# Patient Record
Sex: Female | Born: 1955 | Race: Black or African American | Hispanic: No | Marital: Single | Smoking: Never smoker
Health system: Southern US, Community
[De-identification: ages and names within clinical notes are randomized; demographics above are authoritative.]

## PROBLEM LIST (undated history)

## (undated) DIAGNOSIS — I639 Cerebral infarction, unspecified: Secondary | ICD-10-CM

## (undated) DIAGNOSIS — L299 Pruritus, unspecified: Secondary | ICD-10-CM

## (undated) DIAGNOSIS — Z8719 Personal history of other diseases of the digestive system: Secondary | ICD-10-CM

## (undated) DIAGNOSIS — M67431 Ganglion, right wrist: Secondary | ICD-10-CM

## (undated) DIAGNOSIS — I10 Essential (primary) hypertension: Secondary | ICD-10-CM

## (undated) DIAGNOSIS — E785 Hyperlipidemia, unspecified: Secondary | ICD-10-CM

## (undated) DIAGNOSIS — J3089 Other allergic rhinitis: Secondary | ICD-10-CM

## (undated) DIAGNOSIS — I2510 Atherosclerotic heart disease of native coronary artery without angina pectoris: Secondary | ICD-10-CM

## (undated) DIAGNOSIS — F32A Depression, unspecified: Secondary | ICD-10-CM

## (undated) DIAGNOSIS — M549 Dorsalgia, unspecified: Secondary | ICD-10-CM

## (undated) DIAGNOSIS — B029 Zoster without complications: Secondary | ICD-10-CM

## (undated) DIAGNOSIS — IMO0001 Reserved for inherently not codable concepts without codable children: Secondary | ICD-10-CM

## (undated) DIAGNOSIS — E118 Type 2 diabetes mellitus with unspecified complications: Secondary | ICD-10-CM

## (undated) DIAGNOSIS — Z531 Procedure and treatment not carried out because of patient's decision for reasons of belief and group pressure: Secondary | ICD-10-CM

## (undated) DIAGNOSIS — F329 Major depressive disorder, single episode, unspecified: Secondary | ICD-10-CM

## (undated) DIAGNOSIS — T7840XA Allergy, unspecified, initial encounter: Secondary | ICD-10-CM

## (undated) HISTORY — DX: Depression, unspecified: F32.A

## (undated) HISTORY — PX: WISDOM TOOTH EXTRACTION: SHX21

## (undated) HISTORY — DX: Cerebral infarction, unspecified: I63.9

## (undated) HISTORY — DX: Dorsalgia, unspecified: M54.9

## (undated) HISTORY — PX: COLONOSCOPY: SHX174

## (undated) HISTORY — DX: Hyperlipidemia, unspecified: E78.5

## (undated) HISTORY — DX: Pruritus, unspecified: L29.9

## (undated) HISTORY — DX: Other allergic rhinitis: J30.89

## (undated) HISTORY — DX: Essential (primary) hypertension: I10

## (undated) HISTORY — DX: Ganglion, right wrist: M67.431

## (undated) HISTORY — DX: Type 2 diabetes mellitus with unspecified complications: E11.8

## (undated) HISTORY — DX: Allergy, unspecified, initial encounter: T78.40XA

## (undated) HISTORY — PX: POLYPECTOMY: SHX149

## (undated) HISTORY — DX: Personal history of other diseases of the digestive system: Z87.19

## (undated) HISTORY — DX: Major depressive disorder, single episode, unspecified: F32.9

---

## 1992-11-06 HISTORY — PX: ABDOMINAL HYSTERECTOMY: SHX81

## 1995-11-07 HISTORY — PX: CHOLECYSTECTOMY: SHX55

## 1998-10-21 ENCOUNTER — Encounter: Payer: Self-pay | Admitting: General Surgery

## 1998-10-25 ENCOUNTER — Ambulatory Visit (HOSPITAL_COMMUNITY): Admission: RE | Admit: 1998-10-25 | Discharge: 1998-10-26 | Payer: Self-pay | Admitting: General Surgery

## 1998-11-06 DIAGNOSIS — I639 Cerebral infarction, unspecified: Secondary | ICD-10-CM

## 1998-11-06 HISTORY — DX: Cerebral infarction, unspecified: I63.9

## 1999-06-19 ENCOUNTER — Emergency Department (HOSPITAL_COMMUNITY): Admission: EM | Admit: 1999-06-19 | Discharge: 1999-06-19 | Payer: Self-pay | Admitting: Emergency Medicine

## 1999-06-19 ENCOUNTER — Encounter: Payer: Self-pay | Admitting: Emergency Medicine

## 2002-06-04 ENCOUNTER — Encounter: Admission: RE | Admit: 2002-06-04 | Discharge: 2002-06-04 | Payer: Self-pay | Admitting: Urology

## 2002-06-04 ENCOUNTER — Encounter: Payer: Self-pay | Admitting: Urology

## 2004-06-20 ENCOUNTER — Ambulatory Visit (HOSPITAL_COMMUNITY): Admission: RE | Admit: 2004-06-20 | Discharge: 2004-06-20 | Payer: Self-pay | Admitting: *Deleted

## 2004-06-20 ENCOUNTER — Encounter (INDEPENDENT_AMBULATORY_CARE_PROVIDER_SITE_OTHER): Payer: Self-pay | Admitting: Specialist

## 2006-12-26 ENCOUNTER — Encounter: Admission: RE | Admit: 2006-12-26 | Discharge: 2006-12-26 | Payer: Self-pay | Admitting: Family Medicine

## 2009-12-08 ENCOUNTER — Encounter: Admission: RE | Admit: 2009-12-08 | Discharge: 2009-12-08 | Payer: Self-pay | Admitting: Family Medicine

## 2010-11-27 ENCOUNTER — Encounter: Payer: Self-pay | Admitting: Obstetrics & Gynecology

## 2011-03-24 NOTE — Op Note (Signed)
NAME:  Haley [name], Haley [name]                        ACCOUNT NO.:  [id number]   MEDICAL RECORD NO.:  [id number]                   PATIENT TYPE:  AMB   LOCATION:  ENDO                                 FACILITY:  [facility]   PHYSICIAN:  Georgiana Spinner, M.D.                 DATE OF BIRTH:  10-10-1956   DATE OF PROCEDURE:  06/20/2004  DATE OF DISCHARGE:                                 OPERATIVE REPORT   PROCEDURE:  Upper endoscopy.   INDICATIONS:  GERD, Hemoccult positivity.   ANESTHESIA:  Demerol 70 mg, Versed 7 mg.   PROCEDURE:  With the patient mildly sedated in the left lateral decubitus  position, the Olympus videoscopic endoscope was inserted into the mouth,  passed under direct vision through the esophagus, which appeared normal.  There was no evidence of Barrett's esophagus.  We entered into the stomach.  Fundus, body, antrum, duodenal bulb, second portion of duodenum all appeared  normal.  From this point the endoscope was slowly withdrawn, taking  circumferential views of the duodenal mucosa until the endoscope was pulled  back into the stomach, placed in retroflexion to view the stomach from  below.  The endoscope was straightened and withdrawn taking circumferential  views of the remaining gastric and esophageal mucosa.  The patient's vital  signs and pulse oximetry remained stable.  The patient tolerated the  procedure well without apparent complication.   FINDINGS:  Unremarkable examination.  Proceed to colonoscopy.                                               Georgiana Spinner, M.D.    GMO/MEDQ  D:  06/20/2004  T:  06/20/2004  Job:  [id number]

## 2011-03-24 NOTE — Op Note (Signed)
NAME:  Haley [name], Haley [name]                        ACCOUNT NO.:  [id number]   MEDICAL RECORD NO.:  [id number]                   PATIENT TYPE:  AMB   LOCATION:  ENDO                                 FACILITY:  [facility]   PHYSICIAN:  Georgiana Spinner, M.D.                 DATE OF BIRTH:  Jun 01, 1956   DATE OF PROCEDURE:  06/20/2004  DATE OF DISCHARGE:                                 OPERATIVE REPORT   PROCEDURE:  Colonoscopy.   INDICATIONS:  Colon polyp.   ANESTHESIA:  None further given.   PROCEDURE:  With the patient mildly sedated in the left lateral decubitus  position, the Olympus videoscopic colonoscope was inserted in the rectum and  passed under direct vision to the cecum, identified by the ileocecal valve  and appendiceal orifice, both of which were photographed.  From this point  the colonoscope was slowly withdrawn taking circumferential views of the  colonic mucosa, stopping to photograph diverticula seen along the way in the  sigmoid colon until we reached the rectum, which appeared normal on direct  and showed hemorrhoids and a small polyp on retroflexed view.  The polyp was  removed using hot biopsy forceps technique, setting of 20/20 blended  current.  The endoscope was then straightened and withdrawn.  The patient's  vital signs and pulse oximetry remained stable.  The patient tolerated the  procedure well without apparent complications.   FINDINGS:  1. Internal hemorrhoids.  2. Diverticulosis of the sigmoid colon.  3. Small polyp of rectum.   PLAN:  Await biopsy report.  The patient will call me for results and follow  up with me as an outpatient.                                               Georgiana Spinner, M.D.    GMO/MEDQ  D:  06/20/2004  T:  06/20/2004  Job:  [id number]

## 2012-03-11 ENCOUNTER — Other Ambulatory Visit: Payer: Self-pay | Admitting: Family Medicine

## 2012-03-11 DIAGNOSIS — R1031 Right lower quadrant pain: Secondary | ICD-10-CM

## 2012-03-13 ENCOUNTER — Ambulatory Visit
Admission: RE | Admit: 2012-03-13 | Discharge: 2012-03-13 | Disposition: A | Discharge status: Home / Self Care | Payer: BC Managed Care – PPO | Source: Ambulatory Visit | Attending: Family Medicine | Admitting: Family Medicine

## 2012-03-13 DIAGNOSIS — R1031 Right lower quadrant pain: Secondary | ICD-10-CM

## 2013-07-22 ENCOUNTER — Encounter: Payer: Self-pay | Admitting: Family Medicine

## 2013-07-22 ENCOUNTER — Ambulatory Visit (INDEPENDENT_AMBULATORY_CARE_PROVIDER_SITE_OTHER): Payer: Managed Care, Other (non HMO) | Admitting: Family Medicine

## 2013-07-22 VITALS — BP 128/88 | HR 74 | Temp 98.8°F | Resp 16 | Ht 62.0 in | Wt 224.0 lb

## 2013-07-22 DIAGNOSIS — I10 Essential (primary) hypertension: Secondary | ICD-10-CM | POA: Insufficient documentation

## 2013-07-22 DIAGNOSIS — E118 Type 2 diabetes mellitus with unspecified complications: Secondary | ICD-10-CM | POA: Insufficient documentation

## 2013-07-22 DIAGNOSIS — Z13220 Encounter for screening for lipoid disorders: Secondary | ICD-10-CM

## 2013-07-22 DIAGNOSIS — Z23 Encounter for immunization: Secondary | ICD-10-CM

## 2013-07-22 DIAGNOSIS — J309 Allergic rhinitis, unspecified: Secondary | ICD-10-CM

## 2013-07-22 DIAGNOSIS — E669 Obesity, unspecified: Secondary | ICD-10-CM | POA: Insufficient documentation

## 2013-07-22 DIAGNOSIS — R202 Paresthesia of skin: Secondary | ICD-10-CM

## 2013-07-22 DIAGNOSIS — E119 Type 2 diabetes mellitus without complications: Secondary | ICD-10-CM

## 2013-07-22 HISTORY — DX: Type 2 diabetes mellitus with unspecified complications: E11.8

## 2013-07-22 LAB — COMPREHENSIVE METABOLIC PANEL
ALT: 24 U/L (ref 0–35)
AST: 23 U/L (ref 0–37)
BUN: 12 mg/dL (ref 6–23)
Calcium: 9.9 mg/dL (ref 8.4–10.5)
Chloride: 106 mEq/L (ref 96–112)
Potassium: 3.7 mEq/L (ref 3.5–5.3)
Sodium: 138 mEq/L (ref 135–145)
Total Protein: 7.1 g/dL (ref 6.0–8.3)

## 2013-07-22 LAB — LDL CHOLESTEROL, DIRECT: Direct LDL: 134 mg/dL — ABNORMAL HIGH

## 2013-07-22 MED ORDER — GLIMEPIRIDE 4 MG PO TABS: 4.0000 mg | ORAL_TABLET | Freq: Every day | ORAL | Status: DC

## 2013-07-22 MED ORDER — LISINOPRIL 40 MG PO TABS: 40.0000 mg | ORAL_TABLET | Freq: Every day | ORAL | Status: DC

## 2013-07-22 NOTE — Patient Instructions (Addendum)
Your Diabetes is well controlled. I suspect you have allergy symptoms as the cause of sore throat and sinus headache; resume over-the-counter Cetrizine daily.  Return to the clinic in mid- October for Flu vaccine. You do not need an appotinment.

## 2013-07-22 NOTE — Progress Notes (Signed)
S:  This 57 y.o. AA female is new to [facility]. Pt has Type II DM (diagnosed 8 years ago) and well controlled HTN. FSBS checked daily= 46-120. She c/o muscle cramps and "strange feelings" in her legs; she thinks she may have RLS.   Vision evaluation annually; no diseases and wears corrective lenses.  PMHx, Soc Hx and Fam Hx reviewed.  Medications reconciled.  ROS: Negative for diaphoresis, fatigue, anorexia, abnormal weight change; vision changes, CP or tightness, palpitations, SOB or DOE, cough, myalgias, HA, dizziness, numbness, weakness or syncope. She does not report behavior changes, confusion, decreased concentration or dysphoric mood.  O: Filed Vitals:   07/22/13 1503  BP: 128/88  Pulse: 74  Temp: 98.8 F (37.1 C)  Resp: 16   GEN: In NAD: WN,WD.  HENT: [place]/AT; EOMI w/ clear conj/sclerae. Wearing corrective lenses. EACs/TMs normal. Post ph erythematous w/o exudate or lesions. Nasal mucosa erythematous. Sinuses tender w/ percussion. NECK: Supple; no LAN or TMG. COR: RRR. No edema.Distal pulses 1-2+/=. LUNGS: Normal resp rate and effort. SKIN: W&D; intact. See DM foot exam- no lesions, ulcers, calluses or corns; slight nail discoloration noted. MS: MAEs; mild calf tenderness w/ moderate palpation. NEURO: A&O x 3; CNs intact. (See DM foot exam- n/v intact). Nonfocal.    Results for orders placed in visit on 07/22/13  GLUCOSE, POCT (MANUAL RESULT ENTRY)      Result Value Range   POC Glucose 72  70 - 99 mg/dl  POCT GLYCOSYLATED HEMOGLOBIN (HGB A1C)      Result Value Range   Hemoglobin A1C 5.8      A/P: Type II or unspecified type diabetes mellitus without mention of complication, not stated as uncontrolled -  Continue Glimepiride 4 mg 1 tablet daily. Focus on nutrition modification and regular physical activity.  Plan: POCT glucose (manual entry), T3, Free, TSH, Comprehensive metabolic panel, Vitamin D, 25-hydroxy, POCT glycosylated hemoglobin (Hb A1C)  HTN, goal below 140/90-  Stable on current medication; no change at this time. Refill Lisinopril 40 mg 1 tab daily.  Allergic rhinitis, cause unspecified- Resume OTC Certirizine 10 mg daily.  Paresthesias- R/O Vitamin D deficiency.  Need for lipid screening - Plan: LDL Cholesterol, Direct  Need for prophylactic vaccination against Streptococcus pneumoniae (pneumococcus) - Plan: Pneumococcal polysaccharide vaccine 23-valent greater than or equal to 2yo subcutaneous/IM. RTC in Mid-October for Influenza vaccine.

## 2013-07-23 LAB — TSH: TSH: 2.274 u[IU]/mL (ref 0.350–4.500)

## 2013-07-27 ENCOUNTER — Encounter: Payer: Self-pay | Admitting: Family Medicine

## 2013-09-11 ENCOUNTER — Other Ambulatory Visit: Payer: Self-pay

## 2013-10-23 ENCOUNTER — Ambulatory Visit: Payer: Managed Care, Other (non HMO) | Admitting: Family Medicine

## 2014-02-12 ENCOUNTER — Ambulatory Visit: Payer: Managed Care, Other (non HMO) | Admitting: Family Medicine

## 2014-02-25 ENCOUNTER — Ambulatory Visit (INDEPENDENT_AMBULATORY_CARE_PROVIDER_SITE_OTHER): Payer: Managed Care, Other (non HMO) | Admitting: Family Medicine

## 2014-02-25 ENCOUNTER — Encounter: Payer: Self-pay | Admitting: Family Medicine

## 2014-02-25 VITALS — BP 164/102 | HR 71 | Temp 98.6°F | Resp 16 | Ht 62.0 in | Wt 222.0 lb

## 2014-02-25 DIAGNOSIS — B351 Tinea unguium: Secondary | ICD-10-CM

## 2014-02-25 DIAGNOSIS — E119 Type 2 diabetes mellitus without complications: Secondary | ICD-10-CM

## 2014-02-25 DIAGNOSIS — R002 Palpitations: Secondary | ICD-10-CM

## 2014-02-25 DIAGNOSIS — I10 Essential (primary) hypertension: Secondary | ICD-10-CM

## 2014-02-25 LAB — POCT GLYCOSYLATED HEMOGLOBIN (HGB A1C): HEMOGLOBIN A1C: 5.6

## 2014-02-25 MED ORDER — EFINACONAZOLE 10 % EX SOLN: 1.0000 "application " | Freq: Every day | CUTANEOUS | Status: DC

## 2014-02-25 MED ORDER — GLIMEPIRIDE 4 MG PO TABS: 4.0000 mg | ORAL_TABLET | Freq: Every day | ORAL | Status: DC

## 2014-02-25 MED ORDER — LISINOPRIL 40 MG PO TABS: 40.0000 mg | ORAL_TABLET | Freq: Every day | ORAL | Status: DC

## 2014-02-25 MED ORDER — METOPROLOL SUCCINATE ER 25 MG PO TB24: 12.5000 mg | ORAL_TABLET | Freq: Every day | ORAL | Status: DC

## 2014-02-25 NOTE — Progress Notes (Signed)
S:  This 58 y.o. AA female is here for Type II DM and HTN follow-up. She is compliant w/ medications and meal planning, managing to lose 2 lbs. FSBS checked several times a week; no report of hypoglycemia.   BP uncontrolled; home BP readings seem to be "creeping up and up".  Pt has hx of intermittent palpitations but never evaluated by cardiologist. She denies diaphoresis, fatigue, vision disturbances, CP or tightness, edema, SOB or DOE, cough, abd or back pain, HA, dizziness, numbness, weakness or syncope.  Nail fungus- R great toenail is thick, discolored and occasionally painful.  Patient Active Problem List   Diagnosis Date Noted  . HTN, goal below 140/90 07/22/2013  . Obesity, unspecified 07/22/2013  . Type II or unspecified type diabetes mellitus without mention of complication, not stated as uncontrolled 07/22/2013  . Allergic rhinitis, cause unspecified 07/22/2013   PMHx, Surg Hx, Soc and Fam Hx reviewed.  MEDICATIONS reconciled. Pt indicated she is not taking Tribenzor (this is antihypertensive which has 3 medications in 1 tablet).  O: Filed Vitals:   02/25/14 1058  BP: 164/102  Pulse: 71  Temp: 98.6 F (37 C)  Resp: 16   GEN: In NAD: WN,WD. HENT: [place]/AT; EOMI w/ clear conj/sclerae. Otherwise unremarkable. NECK: Supple w/o LAN or TMG. COR: RRR w/ intermittent PAC. Normal S1 and S2. No m/g/r. No edema. LUNGS: CTA; normal resp rate and effort. SKIN: W&D; intact. R foot- great toenail is discolored and thickened, slightly raised off nailbed. See DM FOOT Exam. NEURO: A&O x 3; CNs intact. Nonfocal.  Results for orders placed in visit on 02/25/14  POCT GLYCOSYLATED HEMOGLOBIN (HGB A1C)      Result Value Ref Range   Hemoglobin A1C 5.6      ECG: Sinus bradycardia. Inferior and anterior T wave changes are nonspecific. No ectopy.   A/P: Type II or unspecified type diabetes mellitus without mention of complication, not stated as uncontrolled - Well controlled on current  medication; continue same. Plan: HM Diabetes Foot Exam, POCT glycosylated hemoglobin (Hb A1C)  HTN, goal below 140/90 - Uncontrolled; continue Lisinopril 40 mg daily and add Metoprolol succinate 25 mg  1/2 tablet daily. Plan: EKG 12-Lead, Ambulatory referral to Cardiology  Intermittent palpitations - Plan: EKG 12-Lead, Ambulatory referral to Cardiology  Nail fungus- Trial Jublia 1 application daily for 48 weeks. If irritation occurs or condition worsens, will refer to Podiatry.  Meds ordered this encounter  Medications  . glimepiride (AMARYL) 4 MG tablet    Sig: Take 1 tablet (4 mg total) by mouth daily before breakfast.    Dispense:  30 tablet    Refill:  5  . lisinopril (PRINIVIL,ZESTRIL) 40 MG tablet    Sig: Take 1 tablet (40 mg total) by mouth daily.    Dispense:  30 tablet    Refill:  5  . metoprolol succinate (TOPROL-XL) 25 MG 24 hr tablet    Sig: Take 0.5 tablets (12.5 mg total) by mouth daily.    Dispense:  30 tablet    Refill:  3  . Efinaconazole 10 % SOLN    Sig: Apply 1 application topically daily.    Dispense:  4 mL    Refill:  3

## 2014-02-25 NOTE — Patient Instructions (Signed)
Women and Heart Disease Heart disease (HD) risk factors for both men and women are similar. Most studies addressing the diagnosis and treatment of heart disease have focused primarily on men. More research is now being done on women and heart disease.  GENDER DIFFERENCES  Symptoms of a heart attack for women may be more subtle, less typical and harder to identify.  Women are often slow to recognize heart disease risk factors and symptoms.  More women than men die from a heart attack before reaching a hospital.  Results of medical tests can vary by gender, especially electrocardiogram stress testing.  A common perception is that men are more likely to have heart problems. This is not true, especially in women after menopause.  Effects of estrogen, birth control and hormone therapy can have unique effects on the heart.  Women are more likely to be referred for a mental health evaluation of their symptoms. CAUSES Heart disease may be caused from conditions such as:  Buildup of fat-like deposits (plaques) in the blood vessels (coronary arteries) of the heart. The build up of fat-like deposits cause blockages that decrease the blood flow to the heart muscle.  Blockage or narrowing of the coronary arteries decreases oxygen to the heart muscle.  Abnormal heart rhythms or problems with the electrical system of the heart.  Heart muscle that has become enlarged or weak and cannot pump well.  Abnormal heart valves that either leak or are thickened and do not open and close properly.  Damage from infection or drugs.  Heart problems present at birth. RISK FACTORS  Family history.  Elevated blood lipid levels (cholesterol).  High blood pressure.  Diabetes.  Smoking.  Inactivity, lack of exercise.  Weighing 30% more than your ideal weight.  Age.  Past history of heart problems. SYMPTOMS  Chest discomfort or pressure, which may include the following:  Discomfort, fullness,  tightness, squeezing in center of chest that stays for a few minutes or comes and goes.  Discomfort or pressure that spreads to upper back, shoulders, neck, jaw or stomach.  Discomfort or tingling in the arms.  Profuse or clammy sweating.  Difficulty breathing.  Nausea.  Feeling your heart "flutter" or "jump."  Unexplained feelings of anxiety, fatigue or weakness.  Dizziness. DIAGNOSIS Diagnosis may include a test that:  Records the electrical activity of the heart and looks for changes (EKG [electrocardiogram]) .  Detects the presence of special proteins and enzymes that may show damage to the heart muscle (blood tests).  Looks at the blood flow through the heart and coronary arteries by using special dyes and X-rays (coronary angiography).  Uses sound waves to examine your heart valves, muscle function and blood flow within the heart (echo or echocardiogram).  Looks for symptoms as your heart works harder under stress (stress tests).  Creates images of the heart by detecting radiation following administration of a radioactive tracer (nuclear imaging).  Records the electrical activity of the heart and helps in detecting abnormalities of heart rhythm (electrophysiology).  Creates an image of the anatomy of the heart (CT heart scan). TREATMENT   Medications may be used to control your blood pressure, keep your heart beating regularly, reduce pain, and help your breathing.  If you are admitted to the hospital, the length of your stay depends on the amount of heart damage and any complications you may have.  Severe heart problems may require open heart surgery. This is a procedure where blocked coronary arteries are bypassed with a vein   from your leg.  If you have a single small coronary artery blockage and no heart damage, you may have a balloon angioplasty. This procedure uses stents that may help open the blockage and restore normal heart circulation. Stents are small,  wire, mesh-like tubes that help keep the artery open.  If needed, blood thinners may be used to dissolve clots. HOME CARE INSTRUCTIONS  Follow the treatment plan your caregiver prescribes.  Keep a list of every medicine you are taking. Keep it up to date and with you all the time.  Get help from your caregiver or pharmacist to learn the following about each medicine:  Why you are taking it.  What time of day to take it.  Possible side effects.  What foods to take with your medication and which foods to avoid.  When to stop taking your medication.  Try to maintain normal cholesterol levels.  Eat a heart healthy diet with salt and fat restrictions as advised. PREVENTION  You can reduce your risk of heart disease by doing the following:  Visit your caregiver regularly and determine whether you are at risk.  Quit smoking and keep away from those who smoke.  Get your blood pressure checked regularly and make sure it remains within normal limits.  Limit salt in your diet.  Maintain normal blood sugar and cholesterol levels.  Exercise regularly (walking or another form of aerobic activity, preferably 30 minutes continuously).  Maintain ideal body weight.  Reduce stress, anger, and depression.  If you have already had a heart attack, consult your caregiver about methods and medicines that may help in reducing your risk of having a second heart attack.  Be aware of the symptoms of heart disease and seek medical care if you develop these symptoms. SEEK IMMEDIATE MEDICAL CARE IF:  You have severe chest pain or the above symptoms, call your local emergency services (911 if in [place] THIS IS AN EMERGENCY. Do not wait to see if the pain will go away. DO NOT drive yourself to the hospital.  You notice increasing shortness of breath during rest, sleeping or with activity. Insist that providers take your complaints seriously and do a thorough heart evaluation. Document Released:  04/10/2008 Document Revised: 01/15/2012 Document Reviewed: 04/10/2008 Exit[employer] Patient Information 2014 [employer].  

## 2014-02-26 ENCOUNTER — Telehealth: Payer: Self-pay

## 2014-02-26 DIAGNOSIS — B351 Tinea unguium: Secondary | ICD-10-CM

## 2014-02-26 DIAGNOSIS — L609 Nail disorder, unspecified: Secondary | ICD-10-CM

## 2014-02-26 NOTE — Telephone Encounter (Signed)
LMOM for pt w/info about the referral and advised she will be getting a call about appt.

## 2014-02-26 NOTE — Telephone Encounter (Signed)
PA needed for Jublia. Called pt who reported she has tried OTC Lotrimax prev but nothing else. Dr Audria NineMcPherson, when I began the PA, the form said that the preferred generics are ciclopirox, fluconazole, and terbinafine. Would any of these be appropriate to try first? If not, please advise so that I will no what to put on the PA form. It will probably not be approved unless she has tried/failed at least some of the alternatives.

## 2014-02-26 NOTE — Telephone Encounter (Signed)
Do not bother with the PA. I would suggest pt see a podiatrist because nail is pretty discolored and abnormal. I did discuss this possibility with her at [facility].  I will order the referral. Would you advise pt about this? Thanks. Sorry for the extra work!  :>)

## 2014-03-19 ENCOUNTER — Encounter: Payer: Self-pay | Admitting: Cardiology

## 2014-03-19 ENCOUNTER — Ambulatory Visit (INDEPENDENT_AMBULATORY_CARE_PROVIDER_SITE_OTHER): Payer: Managed Care, Other (non HMO) | Admitting: Cardiology

## 2014-03-19 VITALS — BP 136/90 | HR 60 | Ht 62.0 in | Wt 222.0 lb

## 2014-03-19 DIAGNOSIS — R0600 Dyspnea, unspecified: Secondary | ICD-10-CM | POA: Insufficient documentation

## 2014-03-19 DIAGNOSIS — I10 Essential (primary) hypertension: Secondary | ICD-10-CM

## 2014-03-19 DIAGNOSIS — R5381 Other malaise: Secondary | ICD-10-CM

## 2014-03-19 DIAGNOSIS — R5383 Other fatigue: Secondary | ICD-10-CM | POA: Insufficient documentation

## 2014-03-19 DIAGNOSIS — R0609 Other forms of dyspnea: Secondary | ICD-10-CM

## 2014-03-19 DIAGNOSIS — R0989 Other specified symptoms and signs involving the circulatory and respiratory systems: Secondary | ICD-10-CM

## 2014-03-19 DIAGNOSIS — R002 Palpitations: Secondary | ICD-10-CM | POA: Insufficient documentation

## 2014-03-19 DIAGNOSIS — R42 Dizziness and giddiness: Secondary | ICD-10-CM

## 2014-03-19 MED ORDER — HYDROCHLOROTHIAZIDE 25 MG PO TABS: 25.0000 mg | ORAL_TABLET | Freq: Every day | ORAL | Status: DC

## 2014-03-19 NOTE — Progress Notes (Signed)
Patient ID: Haley LyonsCynthia L Friar, female   DOB: 03-Jan-1956, 58 y.o.   MRN: [id number]     Patient Name: Haley LyonsCynthia L Miske Date of Encounter: 03/19/2014  Primary Care Provider:  No primary provider on file. Primary Cardiologist: Lars MassonKatarina H Streat  Problem List   Past Medical History  Diagnosis Date  . Diabetes mellitus without complication   . Hypertension    Past Surgical History  Procedure Laterality Date  . Cholecystectomy    . Abdominal hysterectomy    . Wisdom tooth extraction     Allergies  No Known Allergies  HPI  58 year old female with prior medical history of obesity, hypertension, well-controlled known insulin-dependent diabetes mellitus with latest hemoglobin A1c of 5.8 %. The patient is coming with concern of palpitations that happen almost daily and are sometimes associated with dizziness but she never experienced syncopal episodes. Her palpitations usually last seconds but on occasion up to minutes and gets when she becomes symptomatic. The patient is also experiencing worsening dyspnea on exertion, activity of daily is leaving like walking stairs would make her feel short of breath. She feels this is progressively worsening. The patient also states that she has been on blood pressure medication for years but lately her primary care physician had problems controlling diet as her blood pressure keeps increasing despite her being compliant to her meds. She states that today's measurement has been well as it has been in the file and it's usually always elevated. She is also experiencing overall fatigued and mild lower extremity edema.  Home Medications  Prior to Admission medications   Medication Sig Start Date End Date Taking? Authorizing Provider  aspirin 81 MG tablet Take 81 mg by mouth daily.   Yes Historical Provider, MD  Cinnamon 500 MG TABS Take by mouth daily.   Yes Historical Provider, MD  Flax Oil-Fish Oil-Borage Oil (FISH OIL-FLAX OIL-BORAGE OIL PO) Take by mouth  daily.   Yes Historical Provider, MD  Flaxseed, Linseed, (FLAX SEED OIL PO) Take by mouth daily.   Yes Historical Provider, MD  glimepiride (AMARYL) 4 MG tablet Take 1 tablet (4 mg total) by mouth daily before breakfast. 02/25/14  Yes Maurice MarchBarbara B McPherson, MD  lisinopril (PRINIVIL,ZESTRIL) 40 MG tablet Take 1 tablet (40 mg total) by mouth daily. 02/25/14  Yes Maurice MarchBarbara B McPherson, MD  loratadine (ALLERGY RELIEF) 10 MG tablet Take 10 mg by mouth daily.   Yes Historical Provider, MD  Magnesium 250 MG TABS Take by mouth daily.   Yes Historical Provider, MD  metoprolol succinate (TOPROL-XL) 25 MG 24 hr tablet Take 0.5 tablets (12.5 mg total) by mouth daily. 02/25/14  Yes Maurice MarchBarbara B McPherson, MD  Multiple Vitamins-Minerals (MULTIVITAMIN WITH MINERALS) tablet Take 1 tablet by mouth daily.   Yes Historical Provider, MD  Turmeric Curcumin 500 MG CAPS Take 1 tablet by mouth daily.   Yes Historical Provider, MD    Family History  Family History  Problem Relation Age of Onset  . Hypertension Father   . Stroke Maternal Aunt     SEVERAL  . Hypertension Brother   . Diabetes Brother     Social History  History   Social History  . Marital Status: Single    Spouse Name: N/A    Number of Children: N/A  . Years of Education: N/A   Occupational History  . Leasing Agent    Social History Main Topics  . Smoking status: Never Smoker   . Smokeless tobacco: Not on file  . Alcohol  Use: Yes     Comment: occassionally  . Drug Use: No  . Sexual Activity: No   Other Topics Concern  . Not on file   Social History Narrative   Divorced. Education: [employer]. Exercise: Yes.     Review of Systems, as per HPI, otherwise negative General:  No chills, fever, night sweats or weight changes.  Cardiovascular:  No chest pain, dyspnea on exertion, edema, orthopnea, palpitations, paroxysmal nocturnal dyspnea. Dermatological: No rash, lesions/masses Respiratory: No cough, dyspnea Urologic: No hematuria,  dysuria Abdominal:   No nausea, vomiting, diarrhea, bright red blood per rectum, melena, or hematemesis Neurologic:  No visual changes, wkns, changes in mental status. All other systems reviewed and are otherwise negative except as noted above.  Physical Exam  Blood pressure 136/90, pulse 60, height 5\' 2"  (1.575 m), weight 222 lb (100.699 kg).  General: Pleasant, NAD Psych: Normal affect. Neuro: Alert and oriented X 3. Moves all extremities spontaneously. HEENT: Normal  Neck: Supple without bruits or JVD. Lungs:  Resp regular and unlabored, CTA. Heart: RRR no s3, s4, or murmurs. Abdomen: Soft, non-tender, non-distended, BS + x 4.  Extremities: No clubbing, cyanosis, mild bilateral edema. DP/PT/Radials 2+ and equal bilaterally.  Labs:  No results found for this basename: CKTOTAL, CKMB, TROPONINI,  in the last 72 hours No results found for this basename: WBC, HGB, HCT, MCV, PLT    No results found for this basename: DDIMER   No components found with this basename: POCBNP,     Component Value Date/Time   NA 138 07/22/2013 1620   K 3.7 07/22/2013 1620   CL 106 07/22/2013 1620   CO2 24 07/22/2013 1620   GLUCOSE 71 07/22/2013 1620   BUN 12 07/22/2013 1620   CREATININE 0.94 07/22/2013 1620   CALCIUM 9.9 07/22/2013 1620   PROT 7.1 07/22/2013 1620   ALBUMIN 4.1 07/22/2013 1620   AST 23 07/22/2013 1620   ALT 24 07/22/2013 1620   ALKPHOS 54 07/22/2013 1620   BILITOT 0.4 07/22/2013 1620   No results found for this basename: CHOL, HDL, LDLCALC, TRIG    Accessory Clinical Findings  Echocardiogram - none  ECG - sinus bradycardia, nonspecific T wave abnormalities.    Assessment & Plan  Very pleasant 58 year old female  1. palpitations that are associated with dizziness - we will order a 48-hour Holter monitor to evaluate for arrhythmias. Her recent labs were normal including electrolytes, and TSH.  2. worsening exertional dyspnea and fatigue - we will order an exercise nuclear stress  test to evaluate for possible ischemia. We will discontinue Toprol-XL and start hydrochlorothiazide 25 mg daily.  3. Hypertension - we will order echocardiogram to evaluate for systolic diastolic function and degree of LVH. For now we'll discontinue metoprolol as she feels is causing her significant fatigue and we'll start hydrochlorothiazide 25 mg daily  4. Lipids - borderline LDL, for now we will continue just with diet changes.  Followup in 6 weeks.  Lars MassonKatarina H Buffkin, MD, [facility] 03/19/2014, 2:08 PM

## 2014-03-19 NOTE — Patient Instructions (Addendum)
Your physician recommends that you continue on your current medications as directed. Please refer to the Current Medication list given to you today.  Your physician has recommended that you wear a 48 hour holter monitor. Holter monitors are medical devices that record the heart's electrical activity. Doctors most often use these monitors to diagnose arrhythmias. Arrhythmias are problems with the speed or rhythm of the heartbeat. The monitor is a small, portable device. You can wear one while you do your normal daily activities. This is usually used to diagnose what is causing palpitations/syncope (passing out).  Your physician has requested that you have en exercise stress myoview. For further information please visit [email or web address]. Please follow instruction sheet, as given.  Your physician has requested that you have an echocardiogram. Echocardiography is a painless test that uses sound waves to create images of your heart. It provides your doctor with information about the size and shape of your heart and how well your heart's chambers and valves are working. This procedure takes approximately one hour. There are no restrictions for this procedure.  Your physician recommends that you schedule a follow-up appointment in: 6 WEEKS WITH DR Delton SeeNELSON  DISCONTINUE METOPROLOL (TOPROL XL)  START TAKING HYDROCHLOROTHIAZIDE 25 MG DAILY

## 2014-03-26 ENCOUNTER — Encounter (INDEPENDENT_AMBULATORY_CARE_PROVIDER_SITE_OTHER): Payer: Managed Care, Other (non HMO)

## 2014-03-26 ENCOUNTER — Encounter: Payer: Self-pay | Admitting: *Deleted

## 2014-03-26 DIAGNOSIS — R0609 Other forms of dyspnea: Secondary | ICD-10-CM

## 2014-03-26 DIAGNOSIS — I10 Essential (primary) hypertension: Secondary | ICD-10-CM

## 2014-03-26 DIAGNOSIS — R42 Dizziness and giddiness: Secondary | ICD-10-CM

## 2014-03-26 DIAGNOSIS — R002 Palpitations: Secondary | ICD-10-CM

## 2014-03-26 NOTE — Progress Notes (Signed)
Patient ID: Haley LyonsCynthia L [name], female   DOB: [birth year], 58 y.o.   MRN: [id number] E-Cardio 48 hour holter monitor applied to patient.

## 2014-04-07 ENCOUNTER — Telehealth: Payer: Self-pay | Admitting: *Deleted

## 2014-04-07 ENCOUNTER — Ambulatory Visit (HOSPITAL_COMMUNITY): Payer: Managed Care, Other (non HMO) | Attending: Cardiology | Admitting: Radiology

## 2014-04-07 ENCOUNTER — Ambulatory Visit (HOSPITAL_BASED_OUTPATIENT_CLINIC_OR_DEPARTMENT_OTHER): Payer: Managed Care, Other (non HMO) | Admitting: Radiology

## 2014-04-07 VITALS — BP 147/71 | HR 66 | Ht 62.0 in | Wt 220.0 lb

## 2014-04-07 DIAGNOSIS — E119 Type 2 diabetes mellitus without complications: Secondary | ICD-10-CM | POA: Insufficient documentation

## 2014-04-07 DIAGNOSIS — I10 Essential (primary) hypertension: Secondary | ICD-10-CM

## 2014-04-07 DIAGNOSIS — R0609 Other forms of dyspnea: Secondary | ICD-10-CM | POA: Insufficient documentation

## 2014-04-07 DIAGNOSIS — R002 Palpitations: Secondary | ICD-10-CM | POA: Insufficient documentation

## 2014-04-07 DIAGNOSIS — R42 Dizziness and giddiness: Secondary | ICD-10-CM

## 2014-04-07 DIAGNOSIS — R0789 Other chest pain: Secondary | ICD-10-CM | POA: Insufficient documentation

## 2014-04-07 DIAGNOSIS — R0602 Shortness of breath: Secondary | ICD-10-CM

## 2014-04-07 DIAGNOSIS — R5383 Other fatigue: Secondary | ICD-10-CM

## 2014-04-07 DIAGNOSIS — R0989 Other specified symptoms and signs involving the circulatory and respiratory systems: Secondary | ICD-10-CM | POA: Insufficient documentation

## 2014-04-07 DIAGNOSIS — R9431 Abnormal electrocardiogram [ECG] [EKG]: Secondary | ICD-10-CM | POA: Insufficient documentation

## 2014-04-07 DIAGNOSIS — R5381 Other malaise: Secondary | ICD-10-CM | POA: Insufficient documentation

## 2014-04-07 MED ORDER — TECHNETIUM TC 99M SESTAMIBI GENERIC - CARDIOLITE
33.0000 | Freq: Once | INTRAVENOUS | Status: AC | PRN
  Administered 2014-04-07: 33 via INTRAVENOUS

## 2014-04-07 MED ORDER — REGADENOSON 0.4 MG/5ML IV SOLN
0.4000 mg | Freq: Once | INTRAVENOUS | Status: AC
  Administered 2014-04-07: 0.4 mg via INTRAVENOUS

## 2014-04-07 NOTE — Telephone Encounter (Signed)
Pt contacted about normal holter monitor results and normal labs and TSH per Dr Delton See.  Pt verbalizes understanding and pleased with this news.

## 2014-04-07 NOTE — Progress Notes (Signed)
[facility] SITE 3 NUCLEAR MED [address] [place], [place] [address] [phone]    Cardiology Nuclear Med Study  ARMETHA Haley [name] is a 58 y.o. female     MRN : [id number]     DOB: [birth year]  Procedure Date: 04/07/2014  Nuclear Med Background Indication for Stress Test:  Evaluation for Ischemia and Abnormal EKG History:  No known CAD Cardiac Risk Factors: Hypertension and IDDM Type 2  Symptoms:  Chest Pain (last date of chest discomfort was one week ago), Dizziness, DOE, Fatigue and Palpitations   Nuclear Pre-Procedure Caffeine/Decaff Intake:  None NPO After: 7:00pm   Lungs:  clear O2 Sat: 95% on room air. IV 0.9% NS with Angio Cath:  22g  IV Site: R Antecubital  IV Started by:  Milana Na, EMT-P  Chest Size (in):  44 Cup Size: H  Height: 5\' 2"  (1.575 m)  Weight:  220 lb (99.791 kg)  BMI:  Body mass index is 40.23 kg/(m^2). Tech Comments: No Rx this am    Nuclear Med Study 1 or 2 day study: 2 day  Stress Test Type:  Eugenie Birks  Reading MD: n/a  Order Authorizing Provider:  K.Cropp MD  Resting Radionuclide: Technetium 82m Sestamibi  Resting Radionuclide Dose: 33.0 mCi on 04/13/14   Stress Radionuclide:  Technetium 63m Sestamibi  Stress Radionuclide Dose: 33.0 mCi on 04/07/14           Stress Protocol Rest HR: 66 Stress HR: 102  Rest BP: 147/71 Stress BP: 105/60  Exercise Time (min): n/a METS: n/a           Dose of Adenosine (mg):  n/a Dose of Lexiscan: 0.4 mg  Dose of Atropine (mg): n/a Dose of Dobutamine: n/a mcg/kg/min (at max HR)  Stress Test Technologist: Mineer Chimes, BS-ES  Nuclear Technologist:  Domenic Polite, CNMT     Rest Procedure:  Myocardial perfusion imaging was performed at rest 45 minutes following the intravenous administration of Technetium 57m Sestamibi. Rest ECG: NSR - Normal EKG  Stress Procedure:  The patient received IV Lexiscan 0.4 mg over 15-seconds.  Technetium 31m Sestamibi injected at 30-seconds.  Quantitative spect  images were obtained after a 45 minute delay.  Patient was initially an exercise nuclear test but when patient stood we could not obtain a blood pressure with auto cuff or manually.  Attempted using a doppler to get BP without successl.  Pulse was heard all the way down and systolic could be heard in the low 90's.  Elected to have patient sit in chair and continue test with Lexiscan.  While sitting the patient's BP registered just fine with the auto cuff. During the infusion she complained of SOB which began to resolve in recovery.  Stress ECG: Insignificant upsloping ST segment depression.  QPS Raw Data Images:  Normal; no motion artifact; normal heart/lung ratio. Stress Images:  Normal homogeneous uptake in all areas of the myocardium. Rest Images:  Normal homogeneous uptake in all areas of the myocardium. Subtraction (SDS):  There is no evidence of scar or ischemia. Transient Ischemic Dilatation (Normal <1.22):  1.06 Lung/Heart Ratio (Normal <0.45):  0.32  Quantitative Gated Spect Images QGS EDV:  77 ml QGS ESV:  19 ml  Impression Exercise Capacity:  Lexiscan with no exercise. BP Response:  Normal blood pressure response. Clinical Symptoms:  Dyspnea ECG Impression:  No significant ST segment change suggestive of ischemia. Comparison with Prior Nuclear Study: No images to compare  Overall Impression:  Normal stress nuclear  study.  LV Ejection Fraction: 75%.  LV Wall Motion:  NL LV Function; NL Wall Motion  Laurey MoraleDalton S Viney [place]ella 04/13/2014

## 2014-04-07 NOTE — Progress Notes (Signed)
Echocardiogram performed.  

## 2014-04-07 NOTE — Telephone Encounter (Signed)
LMTCB about pts normal holter monitor results and recent normal labs and TSH, per Dr Delton See.

## 2014-04-13 ENCOUNTER — Ambulatory Visit (HOSPITAL_COMMUNITY): Payer: Managed Care, Other (non HMO) | Attending: Cardiology

## 2014-04-13 MED ORDER — TECHNETIUM TC 99M SESTAMIBI GENERIC - CARDIOLITE
33.0000 | Freq: Once | INTRAVENOUS | Status: AC | PRN
  Administered 2014-04-13: 33 via INTRAVENOUS

## 2014-04-14 ENCOUNTER — Telehealth: Payer: Self-pay | Admitting: *Deleted

## 2014-04-14 NOTE — Telephone Encounter (Signed)
Message copied by Loa Socks on Tue Apr 14, 2014 11:28 AM ------      Message from: Lars Masson      Created: Tue Apr 14, 2014 11:08 AM       Normal stress test      LV Ejection Fraction: 75%.  LV Wall Motion:  NL LV Function; NL Wall Motion      Please let her know, thank you      K ------

## 2014-04-14 NOTE — Telephone Encounter (Signed)
Pt contacted about normal stress test results, LV EF was 75%, LV wall motion NL LV function, NL wall motion per Dr Delton See.  Pt verbalized understanding and very pleased with these results.

## 2014-04-24 ENCOUNTER — Ambulatory Visit (INDEPENDENT_AMBULATORY_CARE_PROVIDER_SITE_OTHER): Payer: Managed Care, Other (non HMO) | Admitting: Cardiology

## 2014-04-24 ENCOUNTER — Encounter: Payer: Self-pay | Admitting: Cardiology

## 2014-04-24 VITALS — BP 140/92 | HR 68 | Ht 62.0 in | Wt 223.0 lb

## 2014-04-24 DIAGNOSIS — R0989 Other specified symptoms and signs involving the circulatory and respiratory systems: Secondary | ICD-10-CM

## 2014-04-24 DIAGNOSIS — R0609 Other forms of dyspnea: Secondary | ICD-10-CM

## 2014-04-24 DIAGNOSIS — R0600 Dyspnea, unspecified: Secondary | ICD-10-CM

## 2014-04-24 MED ORDER — AMLODIPINE BESYLATE 2.5 MG PO TABS: 2.5000 mg | ORAL_TABLET | Freq: Every day | ORAL | Status: DC

## 2014-04-24 NOTE — Progress Notes (Signed)
Patient ID: MELICIA ESQUEDA, female   DOB: [birth year], 58 y.o.   MRN: [id number]    Patient Name: Haley [name] Date of Encounter: 04/24/2014  Primary Care Provider:  No primary provider on file. Primary Cardiologist: Lars Masson  Problem List   Past Medical History  Diagnosis Date  . Diabetes mellitus without complication   . Hypertension    Past Surgical History  Procedure Laterality Date  . Cholecystectomy    . Abdominal hysterectomy    . Wisdom tooth extraction     Allergies  No Known Allergies  HPI  58 year old female with prior medical history of obesity, hypertension, well-controlled known insulin-dependent diabetes mellitus with latest hemoglobin A1c of 5.8 %. The patient is coming with concern of palpitations that happen almost daily and are sometimes associated with dizziness but she never experienced syncopal episodes. Her palpitations usually last seconds but on occasion up to minutes and gets when she becomes symptomatic. The patient is also experiencing worsening dyspnea on exertion, activity of daily is leaving like walking stairs would make her feel short of breath. She feels this is progressively worsening. The patient also states that she has been on blood pressure medication for years but lately her primary care physician had problems controlling diet as her blood pressure keeps increasing despite her being compliant to her meds. She states that today's measurement has been well as it has been in the file and it's usually always elevated. She is also experiencing overall fatigued and mild lower extremity edema.  The patient is coming after 1 months. She underwent an echocardiogram that showed normal biventricular function no significant valvular abnormality and mild LVH. The patient also had a stress test that was done which is a Lexiscan into no prior scar or ischemia. Her labs were all normal including TSH.  Home Medications  Prior to Admission  medications   Medication Sig Start Date End Date Taking? Authorizing Provider  aspirin 81 MG tablet Take 81 mg by mouth daily.   Yes Historical Provider, MD  Cinnamon 500 MG TABS Take by mouth daily.   Yes Historical Provider, MD  Flax Oil-Fish Oil-Borage Oil (FISH OIL-FLAX OIL-BORAGE OIL PO) Take by mouth daily.   Yes Historical Provider, MD  Flaxseed, Linseed, (FLAX SEED OIL PO) Take by mouth daily.   Yes Historical Provider, MD  glimepiride (AMARYL) 4 MG tablet Take 1 tablet (4 mg total) by mouth daily before breakfast. 02/25/14  Yes Maurice March, MD  lisinopril (PRINIVIL,ZESTRIL) 40 MG tablet Take 1 tablet (40 mg total) by mouth daily. 02/25/14  Yes Maurice March, MD  loratadine (ALLERGY RELIEF) 10 MG tablet Take 10 mg by mouth daily.   Yes Historical Provider, MD  Magnesium 250 MG TABS Take by mouth daily.   Yes Historical Provider, MD  metoprolol succinate (TOPROL-XL) 25 MG 24 hr tablet Take 0.5 tablets (12.5 mg total) by mouth daily. 02/25/14  Yes Maurice March, MD  Multiple Vitamins-Minerals (MULTIVITAMIN WITH MINERALS) tablet Take 1 tablet by mouth daily.   Yes Historical Provider, MD  Turmeric Curcumin 500 MG CAPS Take 1 tablet by mouth daily.   Yes Historical Provider, MD    Family History  Family History  Problem Relation Age of Onset  . Hypertension Father   . Stroke Maternal Aunt     SEVERAL  . Hypertension Brother   . Diabetes Brother     Social History  History   Social History  . Marital Status: Single  Spouse Name: N/A    Number of Children: N/A  . Years of Education: N/A   Occupational History  . Leasing Agent    Social History Main Topics  . Smoking status: Never Smoker   . Smokeless tobacco: Not on file  . Alcohol Use: Yes     Comment: occassionally  . Drug Use: No  . Sexual Activity: No   Other Topics Concern  . Not on file   Social History Narrative   Divorced. Education: [employer]. Exercise: Yes.     Review of Systems, as  per HPI, otherwise negative General:  No chills, fever, night sweats or weight changes.  Cardiovascular:  No chest pain, dyspnea on exertion, edema, orthopnea, palpitations, paroxysmal nocturnal dyspnea. Dermatological: No rash, lesions/masses Respiratory: No cough, dyspnea Urologic: No hematuria, dysuria Abdominal:   No nausea, vomiting, diarrhea, bright red blood per rectum, melena, or hematemesis Neurologic:  No visual changes, wkns, changes in mental status. All other systems reviewed and are otherwise negative except as noted above.  Physical Exam  Blood pressure 140/92, pulse 68, height 5\' 2"  (1.575 m), weight 223 lb (101.152 kg).  General: Pleasant, NAD Psych: Normal affect. Neuro: Alert and oriented X 3. Moves all extremities spontaneously. HEENT: Normal  Neck: Supple without bruits or JVD. Lungs:  Resp regular and unlabored, CTA. Heart: RRR no s3, s4, or murmurs. Abdomen: Soft, non-tender, non-distended, BS + x 4.  Extremities: No clubbing, cyanosis, mild bilateral edema. DP/PT/Radials 2+ and equal bilaterally.  Labs:  No results found for this basename: CKTOTAL, CKMB, TROPONINI,  in the last 72 hours No results found for this basename: WBC,  HGB,  HCT,  MCV,  PLT    No results found for this basename: DDIMER   No components found with this basename: POCBNP,     Component Value Date/Time   NA 138 07/22/2013 1620   K 3.7 07/22/2013 1620   CL 106 07/22/2013 1620   CO2 24 07/22/2013 1620   GLUCOSE 71 07/22/2013 1620   BUN 12 07/22/2013 1620   CREATININE 0.94 07/22/2013 1620   CALCIUM 9.9 07/22/2013 1620   PROT 7.1 07/22/2013 1620   ALBUMIN 4.1 07/22/2013 1620   AST 23 07/22/2013 1620   ALT 24 07/22/2013 1620   ALKPHOS 54 07/22/2013 1620   BILITOT 0.4 07/22/2013 1620   No results found for this basename: CHOL,  HDL,  LDLCALC,  TRIG    Accessory Clinical Findings  Echocardiogram - 04/07/2014  Left ventricle: The cavity size was normal. Wall thickness was increased in a  pattern of mild LVH. The estimated ejection fraction was 55%. Wall motion was normal; there were no regional wall motion abnormalities. Doppler parameters are consistent with abnormal left ventricular relaxation (grade 1 diastolic dysfunction). - Aortic valve: There was no stenosis. - Mitral valve: There was no significant regurgitation. - Right ventricle: The cavity size was normal. Systolic function was normal. - Pulmonary arteries: No complete TR doppler jet so unable to estimate PA systolic pressure. - Inferior vena cava: The vessel was normal in size. The respirophasic diameter changes were in the normal range (>= 50%), consistent with normal central venous pressure.  Impressions: - Normal LV size with mild LV hypertrophy, EF 55%. Normal RV size and systolic function. No significant valvular abnormalities.  Lexiscan nuclear stress test: 04/14/2014  Impression  Exercise Capacity: Lexiscan with no exercise.  BP Response: Normal blood pressure response.  Clinical Symptoms: Dyspnea  ECG Impression: No significant ST segment change suggestive of ischemia.  Comparison with Prior Nuclear Study: No images to compare  Overall Impression: Normal stress nuclear study.  LV Ejection Fraction: 75%. LV Wall Motion: NL LV Function; NL Wall Motion  Laurey MoraleDalton S McLean  04/13/2014    ECG - sinus bradycardia, nonspecific T wave abnormalities.    Assessment & Plan  Very pleasant 58 year old female  1. palpitations that are associated with dizziness - we will order a 48-hour Holter monitor to evaluate for arrhythmias. All labs normal Holter monitor didn't show anything else other than few PVCs.  2. worsening exertional dyspnea and fatigue -  We will discontinued Toprol-XL , normal stress test she is encouraged to start exercising on a regular basis.  3. Hypertension - normal left ventricular function but mild LVH. We discontinued the Toprol to avoid any fatigue secondary to diet. We started  hydrochlorothiazide for her blood pressure is borderline 140/92 therefore we'll discontinue this and start her on 2.5 mg daily.  4. Lipids - borderline LDL, for now we will continue just with diet changes.  Followup in 6 weeks.  Lars MassonNELSON, Joron Velis H, MD, [facility] 04/24/2014, 11:40 AM

## 2014-04-24 NOTE — Patient Instructions (Signed)
Your physician has recommended you make the following change in your medication:   STOP TAKING HCTZ NOW  START TAKING AMLODIPINE 2.5 MG DAILY  Your physician wants you to follow-up in: 6 MONTHS WITH DR Johnell ComingsNELSON You will receive a reminder letter in the mail two months in advance. If you don't receive a letter, please call our office to schedule the follow-up appointment.

## 2014-09-17 ENCOUNTER — Other Ambulatory Visit: Payer: Self-pay | Admitting: Family Medicine

## 2014-10-19 ENCOUNTER — Other Ambulatory Visit: Payer: Self-pay | Admitting: Physician Assistant

## 2014-10-21 ENCOUNTER — Other Ambulatory Visit: Payer: Self-pay | Admitting: Physician Assistant

## 2014-10-27 ENCOUNTER — Encounter: Payer: Self-pay | Admitting: Family Medicine

## 2014-10-27 ENCOUNTER — Ambulatory Visit (INDEPENDENT_AMBULATORY_CARE_PROVIDER_SITE_OTHER): Payer: Managed Care, Other (non HMO) | Admitting: Family Medicine

## 2014-10-27 VITALS — BP 176/100 | HR 73 | Temp 98.1°F | Resp 16 | Ht 62.0 in | Wt 223.2 lb

## 2014-10-27 DIAGNOSIS — I10 Essential (primary) hypertension: Secondary | ICD-10-CM

## 2014-10-27 DIAGNOSIS — E119 Type 2 diabetes mellitus without complications: Secondary | ICD-10-CM

## 2014-10-27 DIAGNOSIS — J309 Allergic rhinitis, unspecified: Secondary | ICD-10-CM

## 2014-10-27 DIAGNOSIS — R002 Palpitations: Secondary | ICD-10-CM

## 2014-10-27 LAB — POCT GLYCOSYLATED HEMOGLOBIN (HGB A1C): Hemoglobin A1C: 5.7

## 2014-10-27 MED ORDER — GLIMEPIRIDE 4 MG PO TABS: 4.0000 mg | ORAL_TABLET | Freq: Every day | ORAL | Status: DC

## 2014-10-27 MED ORDER — LORATADINE 10 MG PO TABS: 10.0000 mg | ORAL_TABLET | Freq: Every day | ORAL | Status: DC

## 2014-10-27 MED ORDER — AMLODIPINE BESYLATE 5 MG PO TABS: 5.0000 mg | ORAL_TABLET | Freq: Every day | ORAL | Status: DC

## 2014-10-27 NOTE — Patient Instructions (Signed)
DASH Eating Plan DASH stands for "Dietary Approaches to Stop Hypertension." The DASH eating plan is a healthy eating plan that has been shown to reduce high blood pressure (hypertension). Additional health benefits may include reducing the risk of type 2 diabetes mellitus, heart disease, and stroke. The DASH eating plan may also help with weight loss. WHAT DO I NEED TO KNOW ABOUT THE DASH EATING PLAN? For the DASH eating plan, you will follow these general guidelines:  Choose foods with a percent daily value for sodium of less than 5% (as listed on the food label).  Use salt-free seasonings or herbs instead of table salt or sea salt.  Check with your health care provider or pharmacist before using salt substitutes.  Eat lower-sodium products, often labeled as "lower sodium" or "no salt added."  Eat fresh foods.  Eat more vegetables, fruits, and low-fat dairy products.  Choose whole grains. Look for the word "whole" as the first word in the ingredient list.  Choose fish and skinless chicken or turkey more often than red meat. Limit fish, poultry, and meat to 6 oz (170 g) each day.  Limit sweets, desserts, sugars, and sugary drinks.  Choose heart-healthy fats.  Limit cheese to 1 oz (28 g) per day.  Eat more home-cooked food and less restaurant, buffet, and fast food.  Limit fried foods.  Cook foods using methods other than frying.  Limit canned vegetables. If you do use them, rinse them well to decrease the sodium.  When eating at a restaurant, ask that your food be prepared with less salt, or no salt if possible. WHAT FOODS CAN I EAT? Seek help from a dietitian for individual calorie needs. Grains Whole grain or whole wheat bread. Brown rice. Whole grain or whole wheat pasta. Quinoa, bulgur, and whole grain cereals. Low-sodium cereals. Corn or whole wheat flour tortillas. Whole grain cornbread. Whole grain crackers. Low-sodium crackers. Vegetables Fresh or frozen vegetables  (raw, steamed, roasted, or grilled). Low-sodium or reduced-sodium tomato and vegetable juices. Low-sodium or reduced-sodium tomato sauce and paste. Low-sodium or reduced-sodium canned vegetables.  Fruits All fresh, canned (in natural juice), or frozen fruits. Meat and Other Protein Products Ground beef (85% or leaner), grass-fed beef, or beef trimmed of fat. Skinless chicken or turkey. Ground chicken or turkey. Pork trimmed of fat. All fish and seafood. Eggs. Dried beans, peas, or lentils. Unsalted nuts and seeds. Unsalted canned beans. Dairy Low-fat dairy products, such as skim or 1% milk, 2% or reduced-fat cheeses, low-fat ricotta or cottage cheese, or plain low-fat yogurt. Low-sodium or reduced-sodium cheeses. Fats and Oils Tub margarines without trans fats. Light or reduced-fat mayonnaise and salad dressings (reduced sodium). Avocado. Safflower, olive, or canola oils. Natural peanut or almond butter. Other Unsalted popcorn and pretzels. The items listed above may not be a complete list of recommended foods or beverages. Contact your dietitian for more options. WHAT FOODS ARE NOT RECOMMENDED? Grains White bread. White pasta. White rice. Refined cornbread. Bagels and croissants. Crackers that contain trans fat. Vegetables Creamed or fried vegetables. Vegetables in a cheese sauce. Regular canned vegetables. Regular canned tomato sauce and paste. Regular tomato and vegetable juices. Fruits Dried fruits. Canned fruit in light or heavy syrup. Fruit juice. Meat and Other Protein Products Fatty cuts of meat. Ribs, chicken wings, bacon, sausage, bologna, salami, chitterlings, fatback, hot dogs, bratwurst, and packaged luncheon meats. Salted nuts and seeds. Canned beans with salt. Dairy Whole or 2% milk, cream, half-and-half, and cream cheese. Whole-fat or sweetened yogurt. Full-fat   cheeses or blue cheese. Nondairy creamers and whipped toppings. Processed cheese, cheese spreads, or cheese  curds. Condiments Onion and garlic salt, seasoned salt, table salt, and sea salt. Canned and packaged gravies. Worcestershire sauce. Tartar sauce. Barbecue sauce. Teriyaki sauce. Soy sauce, including reduced sodium. Steak sauce. Fish sauce. Oyster sauce. Cocktail sauce. Horseradish. Ketchup and mustard. Meat flavorings and tenderizers. Bouillon cubes. Hot sauce. Tabasco sauce. Marinades. Taco seasonings. Relishes. Fats and Oils Butter, stick margarine, lard, shortening, ghee, and bacon fat. Coconut, palm kernel, or palm oils. Regular salad dressings. Other Pickles and olives. Salted popcorn and pretzels. The items listed above may not be a complete list of foods and beverages to avoid. Contact your dietitian for more information. WHERE CAN I FIND MORE INFORMATION? National Heart, Lung, and Blood Institute: [employer]Prom[employer].itwww.nhlbi.nih.gov/health/health-topics/topics/dash/ Document Released: 10/12/2011 Document Revised: 03/09/2014 Document Reviewed: 08/27/2013 [facility] Patient Information 2015 [employer], [place]. This information is not intended to replace advice given to you by your health care provider. Make sure you discuss any questions you have with your health care provider.    AMLODIPINE- This medication has been increased to 5 mg  1 tablet daily. Continue to monitor your blood pressure. If your top numbers reads above 150 multiple days in a row, contact the clinic. More medication adjustment may be needed.

## 2014-10-31 ENCOUNTER — Encounter: Payer: Self-pay | Admitting: Family Medicine

## 2014-10-31 NOTE — Progress Notes (Signed)
Subjective:    Patient ID: Geoffery Lyonsynthia L Wollard, female    DOB: 03-30-1956, 58 y.o.   MRN: [id number]  HPI  This 58 y.o. AA female has Type II DM and HTN. She is compliant w/ medications; no documented FSBS. Denies hypoglycemic symptoms. No polydipsia or polyuria. No significant change in nutrition or physical activity.  HTN- Still has elevated BP readings despite compliance w/ medications. Last BP at Dr. Lindaann SloughNelson's office = 140/92, HR= 68. Palpitations and DOE continue; 48-hour Holter monitor ordered. Pt did not follow-up at 6 weeks as scheduled.  Cardiology evaluation in June 2015 >> 2D ECHO: Normal LV size w/ mild LV hypertrophy, EF 55%. Normal RV size and systolic function. No significant valvular abnormalities.  Today, pt c/o wheezing ,mild cough and congestion w/ nasal drainage x 3 weeks. No fever/chills, prod cough, pleuritic CP, n/v/abd pain/d, rash, body aches, HA, dizziness, weakness or syncope. Using OTC medications and symptomatic remedies. Out of loratidine.   Patient Active Problem List   Diagnosis Date Noted  . Palpitation 03/19/2014  . Dyspnea on exertion 03/19/2014  . Fatigue 03/19/2014  . HTN, goal below 140/90 07/22/2013  . Obesity, unspecified 07/22/2013  . Type II or unspecified type diabetes mellitus without mention of complication, not stated as uncontrolled 07/22/2013  . Allergic rhinitis 07/22/2013    Prior to Admission medications   Medication Sig Start Date End Date Taking? Authorizing Provider  amLODipine (NORVASC) 5 MG tablet Take 1 tablet (5 mg total) by mouth daily.   Yes Maurice MarchBarbara B Hillary Schwegler, MD  aspirin 81 MG tablet Take 81 mg by mouth daily.   Yes Historical Provider, MD  Cinnamon 500 MG TABS Take by mouth daily.   Yes Historical Provider, MD  Flax Oil-Fish Oil-Borage Oil (FISH OIL-FLAX OIL-BORAGE OIL PO) Take by mouth daily.   Yes Historical Provider, MD  glimepiride (AMARYL) 4 MG tablet Take 1 tablet (4 mg total) by mouth daily with breakfast.   Yes  Maurice MarchBarbara B Sharda Keddy, MD  lisinopril (PRINIVIL,ZESTRIL) 40 MG tablet Take 1 tablet (40 mg total) by mouth daily. 02/25/14  Yes Maurice MarchBarbara B Amenah Tucci, MD  Magnesium 250 MG TABS Take by mouth daily.   Yes Historical Provider, MD  Multiple Vitamins-Minerals (MULTIVITAMIN WITH MINERALS) tablet Take 1 tablet by mouth daily.   Yes Historical Provider, MD  Turmeric Curcumin 500 MG CAPS Take 1 tablet by mouth daily.   Yes Historical Provider, MD  Flaxseed, Linseed, (FLAX SEED OIL PO) Take by mouth daily.    Historical Provider, MD  loratadine (ALLERGY RELIEF) 10 MG tablet Take 1 tablet (10 mg total) by mouth daily.    Maurice MarchBarbara B Lakeyia Surber, MD    History   Social History  . Marital Status: Single    Spouse Name: N/A    Number of Children: N/A  . Years of Education: N/A   Occupational History  . Leasing Agent    Social History Main Topics  . Smoking status: Never Smoker   . Smokeless tobacco: Not on file  . Alcohol Use: Yes     Comment: occassionally  . Drug Use: No  . Sexual Activity: No   Other Topics Concern  . Not on file   Social History Narrative   Divorced. Education: [employer]. Exercise: Yes.     Review of Systems  Constitutional: Positive for fatigue. Negative for fever, diaphoresis, appetite change and unexpected weight change.  HENT: Positive for congestion, postnasal drip and sneezing. Negative for ear pain, mouth sores, rhinorrhea, sore throat,  tinnitus and trouble swallowing.   Eyes: Negative for redness, itching and visual disturbance.  Respiratory: Positive for cough, shortness of breath and wheezing. Negative for choking and chest tightness.   Gastrointestinal: Negative.   Endocrine: Negative.   Musculoskeletal: Negative.   Skin: Negative for rash.  Allergic/Immunologic: Positive for environmental allergies.  Neurological: Negative.   Psychiatric/Behavioral: Positive for sleep disturbance and dysphoric mood.       PHQ-9 = 17.       Objective:   Physical Exam    Constitutional: She is oriented to person, place, and time. She appears well-developed and well-nourished. She does not have a sickly appearance. She does not appear ill. No distress.  HENT:  Head: Normocephalic and atraumatic.  Right Ear: Hearing, tympanic membrane, external ear and ear canal normal.  Left Ear: Hearing, tympanic membrane, external ear and ear canal normal.  Nose: Mucosal edema present. No septal deviation. Right sinus exhibits no maxillary sinus tenderness and no frontal sinus tenderness. Left sinus exhibits no maxillary sinus tenderness and no frontal sinus tenderness.  Mouth/Throat: Uvula is midline and mucous membranes are normal. No oral lesions. Normal dentition. No uvula swelling. Posterior oropharyngeal erythema present. No oropharyngeal exudate.  Neck: Normal range of motion. Neck supple.  Cardiovascular: Normal rate, regular rhythm and normal heart sounds.  Exam reveals no friction rub.   No murmur heard. Pulmonary/Chest: Effort normal and breath sounds normal. No stridor. No respiratory distress. She has no wheezes. She has no rales.  Musculoskeletal: Normal range of motion. She exhibits no edema or tenderness.  Lymphadenopathy:    She has no cervical adenopathy.  Neurological: She is alert and oriented to person, place, and time. No cranial nerve deficit. Coordination normal.  See DM FOOT EXAM.  Skin: Skin is warm and dry. No rash noted. She is not diaphoretic. No erythema.  Psychiatric: Her speech is normal and behavior is normal. Judgment and thought content normal. Her mood appears not anxious. Her affect is not inappropriate. Cognition and memory are normal. She exhibits a depressed mood.  Flat affect.  Vitals reviewed.   Results for orders placed or performed in visit on 10/27/14  POCT glycosylated hemoglobin (Hb A1C)  Result Value Ref Range   Hemoglobin A1C 5.7        Assessment & Plan:  Diabetes mellitus without complication - Stable on current  medications. Plan: POCT glycosylated hemoglobin (Hb A1C), HM Diabetes Foot Exam  HTN, goal below 140/90- Encouraged lifestyle modifications (weight loss, salt intake reduction, healthy nutrition).  Continue same medications.  Palpitations- ? Follow-up with Dr. Delton SeeNelson.  Allergic rhinitis, unspecified allergic rhinitis type- Resume loratidine. Get OTC AYR saline nasal mist. Use humidifier and /ro air purifier in the home.  Meds ordered this encounter  Medications  . glimepiride (AMARYL) 4 MG tablet    Sig: Take 1 tablet (4 mg total) by mouth daily with breakfast.    Dispense:  30 tablet    Refill:  5  . amLODipine (NORVASC) 5 MG tablet    Sig: Take 1 tablet (5 mg total) by mouth daily.    Dispense:  90 tablet    Refill:  3  . loratadine (ALLERGY RELIEF) 10 MG tablet    Sig: Take 1 tablet (10 mg total) by mouth daily.    Dispense:  30 tablet    Refill:  11    PHQ-9 score came to my attention after pt left; will phone her to discuss.

## 2014-11-13 ENCOUNTER — Other Ambulatory Visit: Payer: Self-pay | Admitting: Family Medicine

## 2014-12-11 ENCOUNTER — Other Ambulatory Visit: Payer: Self-pay | Admitting: Physician Assistant

## 2015-02-10 ENCOUNTER — Other Ambulatory Visit: Payer: Self-pay | Admitting: Physician Assistant

## 2015-02-24 ENCOUNTER — Encounter: Payer: Managed Care, Other (non HMO) | Admitting: Family Medicine

## 2015-03-02 ENCOUNTER — Ambulatory Visit (INDEPENDENT_AMBULATORY_CARE_PROVIDER_SITE_OTHER): Payer: Managed Care, Other (non HMO) | Admitting: Family Medicine

## 2015-03-02 ENCOUNTER — Encounter: Payer: Self-pay | Admitting: Family Medicine

## 2015-03-02 VITALS — BP 154/98 | HR 79 | Temp 98.2°F | Resp 16 | Ht 62.5 in | Wt 222.4 lb

## 2015-03-02 DIAGNOSIS — Z1211 Encounter for screening for malignant neoplasm of colon: Secondary | ICD-10-CM | POA: Diagnosis not present

## 2015-03-02 DIAGNOSIS — R309 Painful micturition, unspecified: Secondary | ICD-10-CM | POA: Diagnosis not present

## 2015-03-02 DIAGNOSIS — R3129 Other microscopic hematuria: Secondary | ICD-10-CM

## 2015-03-02 DIAGNOSIS — R1030 Lower abdominal pain, unspecified: Secondary | ICD-10-CM | POA: Diagnosis not present

## 2015-03-02 DIAGNOSIS — Z1212 Encounter for screening for malignant neoplasm of rectum: Secondary | ICD-10-CM

## 2015-03-02 DIAGNOSIS — R312 Other microscopic hematuria: Secondary | ICD-10-CM

## 2015-03-02 DIAGNOSIS — E119 Type 2 diabetes mellitus without complications: Secondary | ICD-10-CM

## 2015-03-02 DIAGNOSIS — R102 Pelvic and perineal pain: Secondary | ICD-10-CM

## 2015-03-02 DIAGNOSIS — I10 Essential (primary) hypertension: Secondary | ICD-10-CM

## 2015-03-02 LAB — COMPLETE METABOLIC PANEL WITH GFR
ALT: 26 U/L (ref 0–35)
AST: 22 U/L (ref 0–37)
Albumin: 3.6 g/dL (ref 3.5–5.2)
Alkaline Phosphatase: 55 U/L (ref 39–117)
BUN: 10 mg/dL (ref 6–23)
CALCIUM: 9.1 mg/dL (ref 8.4–10.5)
CO2: 25 mEq/L (ref 19–32)
CREATININE: 0.88 mg/dL (ref 0.50–1.10)
Chloride: 105 mEq/L (ref 96–112)
GFR, EST AFRICAN AMERICAN: 82 mL/min
GFR, Est Non African American: 71 mL/min
Glucose, Bld: 138 mg/dL — ABNORMAL HIGH (ref 70–99)
Potassium: 3.9 mEq/L (ref 3.5–5.3)
Sodium: 139 mEq/L (ref 135–145)
Total Bilirubin: 0.3 mg/dL (ref 0.2–1.2)
Total Protein: 6.8 g/dL (ref 6.0–8.3)

## 2015-03-02 LAB — POCT UA - MICROSCOPIC ONLY
CASTS, UR, LPF, POC: NEGATIVE
CRYSTALS, UR, HPF, POC: NEGATIVE
Mucus, UA: NEGATIVE

## 2015-03-02 LAB — POCT URINALYSIS DIPSTICK
Bilirubin, UA: NEGATIVE
Glucose, UA: NEGATIVE
Ketones, UA: NEGATIVE
Leukocytes, UA: NEGATIVE
Nitrite, UA: NEGATIVE
PH UA: 5
Protein, UA: NEGATIVE
Spec Grav, UA: <=1.005
Urobilinogen, UA: 0.2

## 2015-03-02 LAB — IFOBT (OCCULT BLOOD): IFOBT: NEGATIVE

## 2015-03-02 LAB — CBC WITH DIFFERENTIAL/PLATELET
Basophils Absolute: 0.1 10*3/uL (ref 0.0–0.1)
Basophils Relative: 1 % (ref 0–1)
Eosinophils Absolute: 0.2 10*3/uL (ref 0.0–0.7)
Eosinophils Relative: 3 % (ref 0–5)
HCT: 38.4 % (ref 36.0–46.0)
Hemoglobin: 12.8 g/dL (ref 12.0–15.0)
Lymphocytes Relative: 32 % (ref 12–46)
Lymphs Abs: 2.1 10*3/uL (ref 0.7–4.0)
MCH: 30.8 pg (ref 26.0–34.0)
MCHC: 33.3 g/dL (ref 30.0–36.0)
MCV: 92.3 fL (ref 78.0–100.0)
MONO ABS: 0.6 10*3/uL (ref 0.1–1.0)
MONOS PCT: 9 % (ref 3–12)
MPV: 9 fL (ref 8.6–12.4)
NEUTROS ABS: 3.7 10*3/uL (ref 1.7–7.7)
NEUTROS PCT: 55 % (ref 43–77)
Platelets: 331 10*3/uL (ref 150–400)
RBC: 4.16 MIL/uL (ref 3.87–5.11)
RDW: 12.8 % (ref 11.5–15.5)
WBC: 6.7 10*3/uL (ref 4.0–10.5)

## 2015-03-02 LAB — POCT GLYCOSYLATED HEMOGLOBIN (HGB A1C): Hemoglobin A1C: 5.9

## 2015-03-02 NOTE — Progress Notes (Signed)
Subjective:    Patient ID: Haley [name], female    DOB: Mar 14, 1956, 59 y.o.   MRN: [id number]  HPI  This 59 y.o. Female has Type II DM w/o complications. She is compliant w/ medications w/o adverse effects. She presents today w/ 2-week hx of LLQ pain and dysuria. She denies acute trauma. She denies fever/chills, fatigue, abnormal weight change, difficulty swallowing, n/v/d, hematochezia, melena, hematuria, incontinence, vaginal spotting or discharge. She reports pain has awakened her from sleep.  Pt was due for CRS in 2015 but did not have procedure; last CRS was in [facility]. Pt reports it was normal. EPIC medical record also shows pt was evaluated by Dr. Annabell Howells, urologist, in the past. Pt denies hx of kidney stones.   Patient Active Problem List   Diagnosis Date Noted  . Palpitation 03/19/2014  . Dyspnea on exertion 03/19/2014  . Fatigue 03/19/2014  . HTN, goal below 140/90 07/22/2013  . Obesity, unspecified 07/22/2013  . Diabetes mellitus without complication 07/22/2013  . Allergic rhinitis 07/22/2013   Prior to Admission medications   Medication Sig Start Date End Date Taking? Authorizing Provider  amLODipine (NORVASC) 5 MG tablet Take 1 tablet (5 mg total) by mouth daily. 10/27/14  Yes Maurice March, MD  aspirin 81 MG tablet Take 81 mg by mouth daily.   Yes Historical Provider, MD  Cinnamon 500 MG TABS Take by mouth daily.   Yes Historical Provider, MD  Flax Oil-Fish Oil-Borage Oil (FISH OIL-FLAX OIL-BORAGE OIL PO) Take by mouth daily.   Yes Historical Provider, MD  glimepiride (AMARYL) 4 MG tablet Take 1 tablet (4 mg total) by mouth daily with breakfast. 10/27/14  Yes Maurice March, MD  lisinopril (PRINIVIL,ZESTRIL) 40 MG tablet TAKE ONE TABLET BY MOUTH ONCE DAILY 02/10/15  Yes Maurice March, MD  loratadine (ALLERGY RELIEF) 10 MG tablet Take 1 tablet (10 mg total) by mouth daily. 10/27/14  Yes Maurice March, MD  Magnesium 250 MG TABS Take by mouth daily.    Yes Historical Provider, MD  Multiple Vitamins-Minerals (MULTIVITAMIN WITH MINERALS) tablet Take 1 tablet by mouth daily.   Yes Historical Provider, MD  OVER THE COUNTER MEDICATION    Yes Historical Provider, MD  OVER THE COUNTER MEDICATION    Yes Historical Provider, MD  Turmeric Curcumin 500 MG CAPS Take 1 tablet by mouth daily.   Yes Historical Provider, MD  Flaxseed, Linseed, (FLAX SEED OIL PO) Take by mouth daily.    Historical Provider, MD   Past Surgical History  Procedure Laterality Date  . Cholecystectomy    . Abdominal hysterectomy    . Wisdom tooth extraction      SOC and FAM HX reviewed.    Review of Systems As per HPI.     Objective:   Physical Exam  Constitutional: She is oriented to person, place, and time. She appears well-developed and well-nourished. No distress.  Blood pressure 154/98, pulse 79, temperature 98.2 F (36.8 C), temperature source Oral, resp. rate 16, height 5' 2.5" (1.588 m), weight 222 lb 6.4 oz (100.88 kg), SpO2 99 %.   HENT:  Head: Normocephalic and atraumatic.  Right Ear: External ear normal.  Left Ear: External ear normal.  Nose: Nose normal.  Mouth/Throat: Oropharynx is clear and moist.  Eyes: Conjunctivae and EOM are normal. No scleral icterus.  Neck: Normal range of motion. Neck supple.  Cardiovascular: Normal rate and regular rhythm.   Pulmonary/Chest: Effort normal and breath sounds normal. No respiratory distress.  Abdominal: Soft. Normal appearance. She exhibits no distension, no pulsatile midline mass and no mass. Bowel sounds are decreased. There is no hepatosplenomegaly. There is tenderness in the left lower quadrant. There is no rebound, no guarding and no CVA tenderness.  Genitourinary: Rectal exam shows external hemorrhoid. Rectal exam shows no fissure, no mass, no tenderness and anal tone normal. Guaiac negative stool. There is no rash, tenderness or lesion on the right labia. There is no rash, tenderness or lesion on the left  labia. Right adnexum displays no mass, no tenderness and no fullness. Left adnexum displays tenderness. Left adnexum displays no mass and no fullness. There is erythema in the vagina. No bleeding in the vagina. No signs of injury around the vagina. Vaginal discharge found.  Vaginal cuff intact.  Musculoskeletal: Normal range of motion.  Neurological: She is alert and oriented to person, place, and time. No cranial nerve deficit. Coordination normal.  Skin: Skin is warm, dry and intact. Lesion noted. No rash noted. She is not diaphoretic.  L medial buttock- 2 hypopigmented nodules (pt states she had 2 boils in that area); lesions not actively draining.  Psychiatric: She has a normal mood and affect. Her behavior is normal. Judgment and thought content normal.  Nursing note and vitals reviewed.    Results for orders placed or performed in visit on 03/02/15  POCT UA - Microscopic Only  Result Value Ref Range   WBC, Ur, HPF, POC 0-1    RBC, urine, microscopic 5-7    Bacteria, U Microscopic Neative    Mucus, UA Negative    Epithelial cells, urine per micros 0-2    Crystals, Ur, HPF, POC Negative    Casts, Ur, LPF, POC Negative    Yeast, UA    POCT urinalysis dipstick  Result Value Ref Range   Color, UA Light Yellow    Clarity, UA clear    Glucose, UA Negative    Bilirubin, UA Negative    Ketones, UA Negative    Spec Grav, UA <=1.005    Blood, UA Moderate    pH, UA 5.0    Protein, UA negative    Urobilinogen, UA 0.2    Nitrite, UA Negative    Leukocytes, UA Negative   IFOBT POC (occult bld, rslt in office)  Result Value Ref Range   IFOBT Negative   POCT glycosylated hemoglobin (Hb A1C)  Result Value Ref Range   Hemoglobin A1C 5.9        Assessment & Plan:  Lower abdominal pain - Plan: POCT UA - Microscopic Only, POCT urinalysis dipstick, IFOBT POC (occult bld, rslt in office), Ambulatory referral to Gastroenterology, COMPLETE METABOLIC PANEL WITH GFR, CBC with  Differential/Platelet  Urinary pain - Plan: POCT UA - Microscopic Only, POCT urinalysis dipstick  Pelvic pain in female - Plan: CBC with Differential/Platelet, CT Abdomen Pelvis W Contrast  Diabetes mellitus without complication - Plan: COMPLETE METABOLIC PANEL WITH GFR, POCT glycosylated hemoglobin (Hb A1C)  Screening for colorectal cancer - Plan: IFOBT POC (occult bld, rslt in office), Ambulatory referral to Gastroenterology  HTN, goal below 140/90 - Plan: COMPLETE METABOLIC PANEL WITH GFR  Microscopic hematuria- Await results of CT; pt may require Urology referral.

## 2015-03-02 NOTE — Patient Instructions (Signed)
I have ordered GI referral as well as CT of abdomen and pelvis to evaluate the lower abdominal pain. I have asked that the study be scheduled on a Monday. The stool specimen today is negative for blood. I will call you with the results of your labs and the A1c done in office today. Have your pharmacy contact our clinic for refills on your medication.

## 2015-03-04 ENCOUNTER — Encounter: Payer: Self-pay | Admitting: Internal Medicine

## 2015-03-19 ENCOUNTER — Ambulatory Visit
Admission: RE | Admit: 2015-03-19 | Discharge: 2015-03-19 | Disposition: A | Discharge status: Home / Self Care | Payer: No Typology Code available for payment source | Source: Ambulatory Visit | Attending: Family Medicine | Admitting: Family Medicine

## 2015-03-19 DIAGNOSIS — R1030 Lower abdominal pain, unspecified: Secondary | ICD-10-CM

## 2015-03-19 DIAGNOSIS — R102 Pelvic and perineal pain: Secondary | ICD-10-CM

## 2015-03-19 MED ORDER — IOPAMIDOL (ISOVUE-300) INJECTION 61%
125.0000 mL | Freq: Once | INTRAVENOUS | Status: AC | PRN
  Administered 2015-03-19: 125 mL via INTRAVENOUS

## 2015-03-23 ENCOUNTER — Telehealth: Payer: Self-pay | Admitting: Family Medicine

## 2015-03-23 MED ORDER — LISINOPRIL 40 MG PO TABS: 40.0000 mg | ORAL_TABLET | Freq: Every day | ORAL | Status: DC

## 2015-03-23 MED ORDER — GLIMEPIRIDE 4 MG PO TABS: 4.0000 mg | ORAL_TABLET | Freq: Every day | ORAL | Status: DC

## 2015-03-23 NOTE — Telephone Encounter (Signed)
I spoke with pt about CT ABD PELVIS results. She has intermittent lower abd pain. CT showed diverticulosis; periumbilical hernia has not changed since 2013. Pt is sch for CRS in July 2016. She will follow-up in 6 months for CPE w/ Deboraha Sprangebbie Gessner, FNP.

## 2015-05-07 ENCOUNTER — Encounter: Payer: Managed Care, Other (non HMO) | Admitting: Internal Medicine

## 2015-08-29 ENCOUNTER — Telehealth: Payer: Self-pay | Admitting: Family Medicine

## 2015-08-29 NOTE — Telephone Encounter (Signed)
lmom of pts new appt time for 09/27/15 is at 9:45 instead of 9:30 

## 2015-08-30 NOTE — Telephone Encounter (Signed)
lmom of pts new appt time for 09/27/15 is at 9:45 instead of 9:30

## 2015-09-27 ENCOUNTER — Encounter: Payer: Self-pay | Admitting: Family Medicine

## 2015-10-04 ENCOUNTER — Encounter (HOSPITAL_COMMUNITY): Payer: Self-pay | Admitting: Emergency Medicine

## 2015-10-04 ENCOUNTER — Emergency Department (HOSPITAL_COMMUNITY)
Admission: EM | Admit: 2015-10-04 | Discharge: 2015-10-04 | Disposition: A | Discharge status: Home / Self Care | Payer: No Typology Code available for payment source | Attending: Emergency Medicine | Admitting: Emergency Medicine

## 2015-10-04 DIAGNOSIS — Y9289 Other specified places as the place of occurrence of the external cause: Secondary | ICD-10-CM | POA: Insufficient documentation

## 2015-10-04 DIAGNOSIS — E119 Type 2 diabetes mellitus without complications: Secondary | ICD-10-CM | POA: Insufficient documentation

## 2015-10-04 DIAGNOSIS — T7840XA Allergy, unspecified, initial encounter: Secondary | ICD-10-CM | POA: Insufficient documentation

## 2015-10-04 DIAGNOSIS — Z7982 Long term (current) use of aspirin: Secondary | ICD-10-CM | POA: Insufficient documentation

## 2015-10-04 DIAGNOSIS — Z8619 Personal history of other infectious and parasitic diseases: Secondary | ICD-10-CM | POA: Insufficient documentation

## 2015-10-04 DIAGNOSIS — Z79899 Other long term (current) drug therapy: Secondary | ICD-10-CM | POA: Insufficient documentation

## 2015-10-04 DIAGNOSIS — Y9389 Activity, other specified: Secondary | ICD-10-CM | POA: Insufficient documentation

## 2015-10-04 DIAGNOSIS — H538 Other visual disturbances: Secondary | ICD-10-CM | POA: Insufficient documentation

## 2015-10-04 DIAGNOSIS — Y998 Other external cause status: Secondary | ICD-10-CM | POA: Insufficient documentation

## 2015-10-04 DIAGNOSIS — X58XXXA Exposure to other specified factors, initial encounter: Secondary | ICD-10-CM | POA: Insufficient documentation

## 2015-10-04 DIAGNOSIS — R51 Headache: Secondary | ICD-10-CM | POA: Insufficient documentation

## 2015-10-04 DIAGNOSIS — I10 Essential (primary) hypertension: Secondary | ICD-10-CM | POA: Insufficient documentation

## 2015-10-04 HISTORY — DX: Zoster without complications: B02.9

## 2015-10-04 MED ORDER — FAMOTIDINE 20 MG PO TABS
40.0000 mg | ORAL_TABLET | Freq: Once | ORAL | Status: AC
  Administered 2015-10-04: 40 mg via ORAL
  Filled 2015-10-04: qty 2

## 2015-10-04 MED ORDER — DIPHENHYDRAMINE HCL 25 MG PO TABS: 50.0000 mg | ORAL_TABLET | Freq: Four times a day (QID) | ORAL | Status: DC

## 2015-10-04 MED ORDER — FAMOTIDINE 20 MG PO TABS: 20.0000 mg | ORAL_TABLET | Freq: Two times a day (BID) | ORAL | Status: DC

## 2015-10-04 MED ORDER — PREDNISONE 10 MG PO TABS: ORAL_TABLET | ORAL | Status: DC

## 2015-10-04 MED ORDER — DIPHENHYDRAMINE HCL 25 MG PO CAPS
50.0000 mg | ORAL_CAPSULE | Freq: Once | ORAL | Status: DC
  Filled 2015-10-04: qty 2

## 2015-10-04 MED ORDER — ACETAMINOPHEN 500 MG PO TABS
1000.0000 mg | ORAL_TABLET | Freq: Once | ORAL | Status: AC
  Administered 2015-10-04: 1000 mg via ORAL
  Filled 2015-10-04: qty 2

## 2015-10-04 NOTE — Discharge Instructions (Signed)

## 2015-10-04 NOTE — ED Provider Notes (Signed)
CSN: [id number]     Arrival date & time 10/04/15  [id number] History   First MD Initiated Contact with Patient 10/04/15 1046     Chief Complaint  Patient presents with  . Facial Swelling  . Rash  . Blurred Vision  . Headache     (Consider location/radiation/quality/duration/timing/severity/associated sxs/prior Treatment) Patient is a 59 y.o. female presenting with rash and headaches. The history is provided by the patient.  Rash Location:  Face Facial rash location:  L eyelid, R eyelid and forehead Quality: itchiness and swelling   Severity:  Moderate Onset quality:  Gradual Duration:  2 days Timing:  Constant Progression:  Worsening Chronicity:  New Context comment:  Sleeps with dog, no obvious new exposures to clothing, animals, plants, foods Relieved by:  Nothing Worsened by:  Nothing tried Ineffective treatments:  None tried Associated symptoms: headaches   Associated symptoms: no fever and no periorbital edema   Headache Associated symptoms: no fever     Past Medical History  Diagnosis Date  . Diabetes mellitus without complication (HCC)   . Hypertension   . Shingles    Past Surgical History  Procedure Laterality Date  . Cholecystectomy    . Abdominal hysterectomy    . Wisdom tooth extraction     Family History  Problem Relation Age of Onset  . Hypertension Father   . Stroke Maternal Aunt     SEVERAL  . Hypertension Brother   . Diabetes Brother    Social History  Substance Use Topics  . Smoking status: Never Smoker   . Smokeless tobacco: None  . Alcohol Use: Yes     Comment: occassionally   OB History    No data available     Review of Systems  Constitutional: Negative for fever.  Skin: Positive for rash.  Neurological: Positive for headaches.  All other systems reviewed and are negative.     Allergies  Review of patient's allergies indicates no known allergies.  Home Medications   Prior to Admission medications   Medication Sig Start  Date End Date Taking? Authorizing Provider  amLODipine (NORVASC) 5 MG tablet Take 1 tablet (5 mg total) by mouth daily. 10/27/14   Maurice MarchBarbara B McPherson, MD  aspirin 81 MG tablet Take 81 mg by mouth daily.    Historical Provider, MD  Cinnamon 500 MG TABS Take by mouth daily.    Historical Provider, MD  Flax Oil-Fish Oil-Borage Oil (FISH OIL-FLAX OIL-BORAGE OIL PO) Take by mouth daily.    Historical Provider, MD  Flaxseed, Linseed, (FLAX SEED OIL PO) Take by mouth daily.    Historical Provider, MD  glimepiride (AMARYL) 4 MG tablet Take 1 tablet (4 mg total) by mouth daily with breakfast. 03/23/15   Maurice MarchBarbara B McPherson, MD  lisinopril (PRINIVIL,ZESTRIL) 40 MG tablet Take 1 tablet (40 mg total) by mouth daily. 03/23/15   Maurice MarchBarbara B McPherson, MD  loratadine (ALLERGY RELIEF) 10 MG tablet Take 1 tablet (10 mg total) by mouth daily. 10/27/14   Maurice MarchBarbara B McPherson, MD  Magnesium 250 MG TABS Take by mouth daily.    Historical Provider, MD  Multiple Vitamins-Minerals (MULTIVITAMIN WITH MINERALS) tablet Take 1 tablet by mouth daily.    Historical Provider, MD  OVER THE COUNTER MEDICATION     Historical Provider, MD  OVER THE COUNTER MEDICATION     Historical Provider, MD  Turmeric Curcumin 500 MG CAPS Take 1 tablet by mouth daily.    Historical Provider, MD   BP 159/100 mmHg  Pulse 79  Temp(Src) 98.1 F (36.7 C) (Oral)  Resp 16  SpO2 97% Physical Exam  Constitutional: She is oriented to person, place, and time. She appears well-developed and well-nourished. No distress.  HENT:  Head: Normocephalic and atraumatic.  Swelling of bilateral upper eyelids with no ptosis, scant pruritic rash on the left side of upper forehead.  Eyes: Conjunctivae are normal.  Neck: Neck supple. No tracheal deviation present.  Cardiovascular: Normal rate and regular rhythm.   Pulmonary/Chest: Effort normal. No respiratory distress.  Abdominal: Soft. She exhibits no distension.  Neurological: She is alert and oriented to  person, place, and time. She has normal strength. No cranial nerve deficit. Coordination normal. GCS eye subscore is 4. GCS verbal subscore is 5. GCS motor subscore is 6.  Skin: Skin is warm and dry.  Mild pruritic rash of left forearm with excoriation marks  Psychiatric: She has a normal mood and affect.    ED Course  Procedures (including critical care time) Labs Review Labs Reviewed - No data to display  Imaging Review No results found. I have personally reviewed and evaluated these images and lab results as part of my medical decision-making.   EKG Interpretation None      MDM   Final diagnoses:  Allergic reaction, initial encounter    59 y.o. female presents with mild reaction to unknown allergen. No evidence of infection or other etiology currently. Provided H1/H2 blockers and steroid taper as facial involvement now evident. Tylenol for secondary complaint of headache. Advised to keep journal of possible exposures. Pt concerned about shingles but symptoms are scattered and bilateral and are not typical of this. Plan to follow up with PCP as needed and return precautions discussed for worsening or new concerning symptoms.     Lyndal Pulley, MD 10/04/15 [phone]

## 2015-10-04 NOTE — ED Notes (Signed)
Pt c/o rash area on forehead and left arm that came up last week.  The areas are warm per pt.  Pt states that she also has headache and eye lids swollen yesterday.  Pt states that she wouldn't be seen but today she noticed blurred vision.  Pt states that she has had shingles before and feels similar.  Pt states that she also now starting to have left facial numbness.

## 2015-10-12 ENCOUNTER — Encounter: Payer: Self-pay | Admitting: Family Medicine

## 2015-11-08 ENCOUNTER — Other Ambulatory Visit: Payer: Self-pay

## 2015-11-08 MED ORDER — AMLODIPINE BESYLATE 5 MG PO TABS: 5.0000 mg | ORAL_TABLET | Freq: Every day | ORAL | Status: DC

## 2015-12-28 ENCOUNTER — Encounter: Payer: Self-pay | Admitting: Internal Medicine

## 2015-12-28 ENCOUNTER — Ambulatory Visit (INDEPENDENT_AMBULATORY_CARE_PROVIDER_SITE_OTHER): Payer: Self-pay | Admitting: Internal Medicine

## 2015-12-28 VITALS — BP 138/90 | HR 78 | Ht 62.0 in | Wt 226.0 lb

## 2015-12-28 DIAGNOSIS — R109 Unspecified abdominal pain: Secondary | ICD-10-CM

## 2015-12-28 DIAGNOSIS — J3089 Other allergic rhinitis: Secondary | ICD-10-CM

## 2015-12-28 DIAGNOSIS — K047 Periapical abscess without sinus: Secondary | ICD-10-CM

## 2015-12-28 DIAGNOSIS — Z23 Encounter for immunization: Secondary | ICD-10-CM

## 2015-12-28 DIAGNOSIS — K219 Gastro-esophageal reflux disease without esophagitis: Secondary | ICD-10-CM

## 2015-12-28 DIAGNOSIS — R1011 Right upper quadrant pain: Secondary | ICD-10-CM

## 2015-12-28 DIAGNOSIS — E119 Type 2 diabetes mellitus without complications: Secondary | ICD-10-CM

## 2015-12-28 DIAGNOSIS — I10 Essential (primary) hypertension: Secondary | ICD-10-CM

## 2015-12-28 DIAGNOSIS — G8929 Other chronic pain: Secondary | ICD-10-CM

## 2015-12-28 LAB — GLUCOSE, POCT (MANUAL RESULT ENTRY): POC Glucose: 92 mg/dl (ref 70–99)

## 2015-12-28 MED ORDER — AMLODIPINE BESYLATE 5 MG PO TABS: 5.0000 mg | ORAL_TABLET | Freq: Every day | ORAL | Status: DC

## 2015-12-28 MED ORDER — FAMOTIDINE 20 MG PO TABS: ORAL_TABLET | ORAL | Status: DC

## 2015-12-28 MED ORDER — LISINOPRIL 40 MG PO TABS: ORAL_TABLET | ORAL | Status: DC

## 2015-12-28 MED ORDER — PENICILLIN V POTASSIUM 250 MG PO TABS: ORAL_TABLET | ORAL | Status: DC

## 2015-12-28 MED ORDER — LISINOPRIL 20 MG PO TABS
20.0000 mg | ORAL_TABLET | Freq: Every day | ORAL | Status: DC
Start: 2015-12-28 — End: 2015-12-28

## 2015-12-28 MED ORDER — HYDROCODONE-ACETAMINOPHEN 5-325 MG PO TABS: ORAL_TABLET | ORAL | Status: DC

## 2015-12-28 MED ORDER — DESLORATADINE 5 MG PO TABS: 5.0000 mg | ORAL_TABLET | Freq: Every day | ORAL | Status: DC

## 2015-12-28 MED ORDER — LISINOPRIL 20 MG PO TABS: ORAL_TABLET | ORAL | Status: DC

## 2015-12-28 MED ORDER — GLIMEPIRIDE 4 MG PO TABS: 4.0000 mg | ORAL_TABLET | Freq: Every day | ORAL | Status: DC

## 2015-12-28 NOTE — Progress Notes (Signed)
Subjective:    Patient ID: Haley [name], female    DOB: 04-Nov-1956, 60 y.o.   MRN: [id number]  HPI  1.  Pain in right flank:  Intermittent, though more frequent and more painful in past few weeks.  Has had for about 6 months. Was very severe 5 days ago.  MOvement seems to set it off--bending over to put shoes on or twisting torso.  Currently, has pain every day.   5 days ago, pain was very sharp and severe and lasted 3 minutes or longer.  If is still, gradually dissipates.   Feels like a Knot she is lying on and when goes to turn over, gets sharp pain.  Has not injured her back that she is aware of in past. History of Cholecystectomy.  2.  Chest Pain:  Feels this is indigestion, because she googled it.  First started noting about 1 month ago.  Was fairly sporadic, but now has up to 2-3 times daily.  Describes having a "coldness"  On the inside of her chest--runs her flat hand up and down right sternum.   Noted first after eating and was driving home.  Has noted when goes to bed and lying down.  May occur within 2 hours of eating most times.  Does get acid taste in mouth--has been a problem last 3 weeks, mainly in evening.  Notes especially after the chest pain subsides.  No definite acid taste in mouth in morning. Has tried peppermint lozenges to decrease symptoms with some success.   No melena or hematochezia. Had a cardiac work up JUne of 2015 for exertional dyspnea, including echo, EKG, Nuclear stress test, all of which were normal. See significant use of Ibuprofen below.  3.  Dental decay:  Pain from cavities in right lower jaw are causing a lot of pain intermittently.  Has had the cavities for years.  Has been taking 6-8 200 mg tabs of Ibuprofen every 4 hours for the past 2 months.  4.  DM:  A1C has been in high 5 range in past.  Has been out of medication.  Ran out of Glimepiride 1 week ago.  This is the only medication she takes.  Had balance issues with Metformin.    5.  Essential  Hypertension:  Has been out of Amlodipine since December.  6.  Allergies, year round with seasonal worsening during spring:  Using Benadryl and Claritin  Nonfasting   Review of Systems     Objective:   Physical Exam  NAD HEENT:  PERRL, EOMI, discs sharp, no hemorrhages or exudates noted, throat without injection or exudate. Signigicant dental decay, with some erythema and swelling of gingiva of upper right gingiva.  No fluctuance.  TMs pearly gray, Neck:  Supple, no adenopathy or thyromegally Chest:  CTA, Quite tender over anterior tip of right floating rib and as follow it back to lateral aspect.  No crepitus, or drop off. CV:  RRR with normal S1 and S2, No S3, S4, or murmur appreciated, Radial and DP pulses normal and equal. Abd:  NT, even in RUQ when palpate beneath ribs, No HSM or mass, +BS. Extrems:  No edema  Meds:   1.  Amlodipine 5 mg daily ASA 81 mg daily Cinnamon Flax oil-fish oil, borage oil Flaxseed, linseed Garlic Magnesium MV Lecithin Turmeric curcumin Benadryl 25 mg as needed Famotidine 20 mg daily Glimepiride 4 mg daily Lisinopril 40 mg dialy  Allergies:  NKDA  Assessment & Plan:  1.  Right lower chest wall pain:  Appears to be coming from right 12th floating rib.  Discussed weight loss, avoid tight waisted pants.  To avoid palpating the area.  To call if worsens.  Check CMP/CBC   2.  GERD vs gastritis, though NT over epigastrium today.  Likely due to significant overuse of Ibuprofen.  Restart Famotidine at 40 mg daily  3.  DM:  Restart Glimepiride 4 mg daily.  A1C today, influenza vaccine  4.  Essential Hypertension:  Restart Lisinopril and Amlodipine  5.  Abscessed Tooth:  Start pen VK 250 mg 4 times daily for 7 days.   Stop Ibuprofen and other NSAIDS Hydrocodone/APAP for pain Urgent Dental referral Check CMP to evaluate kidney and liver function with NSAID overuse  6.  HM:  Thinks she has had Tdap in last 4 years--will send for  records.  7.  Allergies:  Clarinex 5 mg through [facility]

## 2015-12-28 NOTE — Patient Instructions (Signed)
Stop Ibuprofen Always take aspirin with food

## 2015-12-29 ENCOUNTER — Encounter: Payer: Self-pay | Admitting: Internal Medicine

## 2015-12-29 LAB — COMPREHENSIVE METABOLIC PANEL
ALBUMIN: 4.3 g/dL (ref 3.6–4.8)
ALK PHOS: 68 IU/L (ref 39–117)
ALT: 26 IU/L (ref 0–32)
AST: 19 IU/L (ref 0–40)
Albumin/Globulin Ratio: 1.5 (ref 1.1–2.5)
BILIRUBIN TOTAL: 0.3 mg/dL (ref 0.0–1.2)
BUN / CREAT RATIO: 10 — AB (ref 11–26)
BUN: 12 mg/dL (ref 8–27)
CO2: 25 mmol/L (ref 18–29)
CREATININE: 1.21 mg/dL — AB (ref 0.57–1.00)
Calcium: 9.7 mg/dL (ref 8.7–10.3)
Chloride: 99 mmol/L (ref 96–106)
GFR calc non Af Amer: 48 mL/min/{1.73_m2} — ABNORMAL LOW (ref >59)
GFR, EST AFRICAN AMERICAN: 56 mL/min/{1.73_m2} — AB (ref >59)
GLOBULIN, TOTAL: 2.8 g/dL (ref 1.5–4.5)
Glucose: 89 mg/dL (ref 65–99)
Potassium: 4.2 mmol/L (ref 3.5–5.2)
SODIUM: 141 mmol/L (ref 134–144)
TOTAL PROTEIN: 7.1 g/dL (ref 6.0–8.5)

## 2015-12-29 LAB — CBC WITH DIFFERENTIAL/PLATELET
BASOS ABS: 0.1 10*3/uL (ref 0.0–0.2)
BASOS: 1 %
EOS (ABSOLUTE): 0.2 10*3/uL (ref 0.0–0.4)
Eos: 4 %
HEMATOCRIT: 41.7 % (ref 34.0–46.6)
HEMOGLOBIN: 13.9 g/dL (ref 11.1–15.9)
IMMATURE GRANS (ABS): 0 10*3/uL (ref 0.0–0.1)
Immature Granulocytes: 0 %
LYMPHS ABS: 2.4 10*3/uL (ref 0.7–3.1)
Lymphs: 41 %
MCH: 31.2 pg (ref 26.6–33.0)
MCHC: 33.3 g/dL (ref 31.5–35.7)
MCV: 94 fL (ref 79–97)
MONOCYTES: 10 %
Monocytes Absolute: 0.6 10*3/uL (ref 0.1–0.9)
NEUTROS ABS: 2.7 10*3/uL (ref 1.4–7.0)
Neutrophils: 44 %
Platelets: 332 10*3/uL (ref 150–379)
RBC: 4.46 x10E6/uL (ref 3.77–5.28)
RDW: 13.2 % (ref 12.3–15.4)
WBC: 6 10*3/uL (ref 3.4–10.8)

## 2015-12-29 LAB — HGB A1C W/O EAG: HEMOGLOBIN A1C: 6.3 % — AB (ref 4.8–5.6)

## 2016-01-25 ENCOUNTER — Encounter: Payer: Self-pay | Admitting: Internal Medicine

## 2016-01-25 ENCOUNTER — Ambulatory Visit (INDEPENDENT_AMBULATORY_CARE_PROVIDER_SITE_OTHER): Payer: Self-pay | Admitting: Internal Medicine

## 2016-01-25 VITALS — BP 146/92 | HR 84 | Ht 62.0 in | Wt 226.0 lb

## 2016-01-25 DIAGNOSIS — F329 Major depressive disorder, single episode, unspecified: Secondary | ICD-10-CM

## 2016-01-25 DIAGNOSIS — F32A Depression, unspecified: Secondary | ICD-10-CM

## 2016-01-25 DIAGNOSIS — R748 Abnormal levels of other serum enzymes: Secondary | ICD-10-CM

## 2016-01-25 DIAGNOSIS — I10 Essential (primary) hypertension: Secondary | ICD-10-CM

## 2016-01-25 DIAGNOSIS — J309 Allergic rhinitis, unspecified: Secondary | ICD-10-CM

## 2016-01-25 DIAGNOSIS — E119 Type 2 diabetes mellitus without complications: Secondary | ICD-10-CM

## 2016-01-25 DIAGNOSIS — K219 Gastro-esophageal reflux disease without esophagitis: Secondary | ICD-10-CM

## 2016-01-25 DIAGNOSIS — R7989 Other specified abnormal findings of blood chemistry: Secondary | ICD-10-CM

## 2016-01-25 LAB — GLUCOSE, POCT (MANUAL RESULT ENTRY): POC GLUCOSE: 213 mg/dL — AB (ref 70–99)

## 2016-01-25 NOTE — Patient Instructions (Signed)
Drink a glass of water before every meal Drink 6-8 glasses of water daily Eat three meals daily Eat a protein and healthy fat with every meal (eggs,fish, chicken, turkey and limit red meats) Eat 5 servings of vegetables daily, mix the colors Eat 2 servings of fruit daily with skin, if skin is edible Use smaller plates Put food/utensils down as you chew and swallow each bite Eat at a table with friends/family at least once daily, no TV Do not eat in front of the TV 

## 2016-01-25 NOTE — Progress Notes (Signed)
Subjective:    Patient ID: Haley [name], female    DOB: 03-03-1956, 60 y.o.   MRN: [id number]  HPI  1.  DM:  Was off meds, not eating well and not being physically active.  Skips meals. Is meeting with a Nutritionist through [employer].  Missing Glimepiride occasionally --see depression  2.  Essential Hypertension:  Did not take medication this morning--and thinks she forgot yesterday.     3.  Depression:  Has not met Haley [name].  Applied for an apt.  Living with daughter and grandchildren--chaotic and feels like an intruder.  Also, they do not eat healthy.  4.  Dental pain:  Has not heard anything about dentist. Was taking Ibuprofen regularly-8 tabs daily.  Not using since given Hydrocodone.  Penicillin really helped.  Still having lots of pain where cannot eat without hydrocodone.  5.  Chest discomfort/GERD:  Not taking Famotidine regularly--only taking as needed.  Cannot say she is any better  6.  Allergies:  Really helps if takes Clarinex.   Current outpatient prescriptions:  .  aspirin 81 MG tablet, Take 81 mg by mouth daily., Disp: , Rfl:  .  desloratadine (CLARINEX) 5 MG tablet, Take 1 tablet (5 mg total) by mouth daily., Disp: 30 tablet, Rfl: 11 .  diphenhydrAMINE (BENADRYL) 25 MG tablet, Take 2 tablets (50 mg total) by mouth every 6 (six) hours., Disp: 40 tablet, Rfl: 0 .  famotidine (PEPCID) 20 MG tablet, 2 tabs by mouth at bedtime, Disp: 60 tablet, Rfl: 1 .  Flax Oil-Fish Oil-Borage Oil (FISH OIL-FLAX OIL-BORAGE OIL PO), Take by mouth daily. Reported on 12/28/2015, Disp: , Rfl:  .  Flaxseed, Linseed, (FLAX SEED OIL PO), Take by mouth daily. Reported on 12/28/2015, Disp: , Rfl:  .  Garlic 517 MG TABS, Take 100 mg by mouth daily., Disp: , Rfl:  .  glimepiride (AMARYL) 4 MG tablet, Take 1 tablet (4 mg total) by mouth daily with breakfast., Disp: 30 tablet, Rfl: 11 .  HYDROcodone-acetaminophen (NORCO/VICODIN) 5-325 MG tablet, 1-2 tabs by mouth every 4 hours as needed for  dental pain, Disp: 30 tablet, Rfl: 0 .  lisinopril (PRINIVIL,ZESTRIL) 40 MG tablet, 1 tab by mouth once daily, Disp: 30 tablet, Rfl: 11 .  Magnesium 250 MG TABS, Take by mouth daily., Disp: , Rfl:  .  Multiple Vitamins-Minerals (MULTIVITAMIN WITH MINERALS) tablet, Take 1 tablet by mouth daily., Disp: , Rfl:  .  Turmeric Curcumin 500 MG CAPS, Take 1 tablet by mouth daily., Disp: , Rfl:  .  amLODipine (NORVASC) 5 MG tablet, Take 1 tablet (5 mg total) by mouth daily., Disp: 30 tablet, Rfl: 11 .  Cinnamon 500 MG TABS, Take by mouth daily. Reported on 01/25/2016, Disp: , Rfl:  .  OVER THE COUNTER MEDICATION, Reported on 01/25/2016, Disp: , Rfl:  .  OVER THE COUNTER MEDICATION, , Disp: , Rfl:    No Known Allergies    Review of Systems     Objective:   Physical Exam HEENT:  PERRL, EOMI, TMs pearly gray, no posterior pharyngeal cobbling, dental decay.  No active erythema or swelling of gingiva. Neck;  Supple, no adenopathy Chest:  CTA CV:  RRR without murmur or rub, radial pulses normal and equal. Abd:  S, NT, No HSM or mass       Assessment & Plan:  1.  Mildly elevated Creatinine with Feb visit.  Hold on BMet until next visit as has not been taking meds regularly.  2.  Essential Hypertension:  Not controlled.  Discussed importance of staying on meds, healthy eating as best she can and regular physical acivity.  3.  DM:  As in #2  4.  Dental Decay:  Checking on dental appt.    5.  GERD:  Needs to take meds and work on diet/follow GERD precautions.  6.  Depression:  Referral to Haley Burows, LCSW.  Do not believe patient will be able to adequately care for her medical conditions until this is addressed.

## 2016-01-31 ENCOUNTER — Other Ambulatory Visit: Payer: No Typology Code available for payment source | Admitting: Licensed Clinical Social Worker

## 2016-02-04 ENCOUNTER — Other Ambulatory Visit: Payer: No Typology Code available for payment source | Admitting: Licensed Clinical Social Worker

## 2016-02-11 ENCOUNTER — Ambulatory Visit (INDEPENDENT_AMBULATORY_CARE_PROVIDER_SITE_OTHER): Payer: Self-pay | Admitting: Licensed Clinical Social Worker

## 2016-02-11 DIAGNOSIS — Z658 Other specified problems related to psychosocial circumstances: Secondary | ICD-10-CM

## 2016-02-11 DIAGNOSIS — F439 Reaction to severe stress, unspecified: Secondary | ICD-10-CM
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
## 2016-02-22 ENCOUNTER — Other Ambulatory Visit: Payer: No Typology Code available for payment source | Admitting: Licensed Clinical Social Worker

## 2016-02-25 ENCOUNTER — Ambulatory Visit (INDEPENDENT_AMBULATORY_CARE_PROVIDER_SITE_OTHER): Payer: Self-pay | Admitting: Licensed Clinical Social Worker

## 2016-02-25 DIAGNOSIS — F32A Depression, unspecified: Secondary | ICD-10-CM

## 2016-02-25 DIAGNOSIS — F329 Major depressive disorder, single episode, unspecified: Secondary | ICD-10-CM
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
## 2016-02-27 DIAGNOSIS — F329 Major depressive disorder, single episode, unspecified: Secondary | ICD-10-CM | POA: Insufficient documentation

## 2016-02-27 DIAGNOSIS — F32A Depression, unspecified: Secondary | ICD-10-CM | POA: Insufficient documentation

## 2016-02-27 DIAGNOSIS — K219 Gastro-esophageal reflux disease without esophagitis: Secondary | ICD-10-CM | POA: Insufficient documentation

## 2016-03-03 ENCOUNTER — Ambulatory Visit (INDEPENDENT_AMBULATORY_CARE_PROVIDER_SITE_OTHER): Payer: Self-pay | Admitting: Licensed Clinical Social Worker

## 2016-03-03 DIAGNOSIS — F331 Major depressive disorder, recurrent, moderate: Secondary | ICD-10-CM
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
## 2016-03-08 ENCOUNTER — Encounter: Payer: Self-pay | Admitting: Internal Medicine

## 2016-03-08 ENCOUNTER — Ambulatory Visit (INDEPENDENT_AMBULATORY_CARE_PROVIDER_SITE_OTHER): Payer: Self-pay | Admitting: Internal Medicine

## 2016-03-08 VITALS — BP 138/88 | HR 76 | Resp 18 | Ht 62.0 in | Wt 225.5 lb

## 2016-03-08 DIAGNOSIS — Z79899 Other long term (current) drug therapy: Secondary | ICD-10-CM

## 2016-03-08 DIAGNOSIS — I10 Essential (primary) hypertension: Secondary | ICD-10-CM

## 2016-03-08 DIAGNOSIS — E785 Hyperlipidemia, unspecified: Secondary | ICD-10-CM

## 2016-03-08 DIAGNOSIS — E119 Type 2 diabetes mellitus without complications: Secondary | ICD-10-CM

## 2016-03-08 DIAGNOSIS — J3089 Other allergic rhinitis: Secondary | ICD-10-CM

## 2016-03-08 DIAGNOSIS — F329 Major depressive disorder, single episode, unspecified: Secondary | ICD-10-CM

## 2016-03-08 DIAGNOSIS — R748 Abnormal levels of other serum enzymes: Secondary | ICD-10-CM

## 2016-03-08 DIAGNOSIS — F32A Depression, unspecified: Secondary | ICD-10-CM

## 2016-03-08 DIAGNOSIS — E669 Obesity, unspecified: Secondary | ICD-10-CM

## 2016-03-08 DIAGNOSIS — R7989 Other specified abnormal findings of blood chemistry: Secondary | ICD-10-CM

## 2016-03-08 DIAGNOSIS — B351 Tinea unguium: Secondary | ICD-10-CM

## 2016-03-08 MED ORDER — TERBINAFINE HCL 250 MG PO TABS: 250.0000 mg | ORAL_TABLET | Freq: Every day | ORAL | Status: DC

## 2016-03-08 MED ORDER — MOMETASONE FUROATE 50 MCG/ACT NA SUSP: NASAL | Status: DC

## 2016-03-08 NOTE — Progress Notes (Signed)
Subjective:    Patient ID: Haley [name], female    DOB: 25-Nov-1955, 60 y.o.   MRN: [id number]  HPI   1. Dental Decay: Had the painful tooth pulled and goes back this week to get 4 fillings.  2. DM Type 2:  Checking sugars every morning and sometimes in evenings before last meal of day.   Started tracking of 4/24:  Sugars 83 - 121 all fasting. Has appt. With [facility] through the [employer] [facility] on May 8th. Checks feet nightly--no concerns and performing regular foot care.  Immunizations:  Up to date with influenza and Pneumococcal vaccine.  Cannot recall last Td  3.  Essential Hypertension: Has missed all meds maybe twice in past month.  Pretty good compared to last visit.  Lisinopril and Amlodipine.  Has obtained a pillbox and plans to start using this week.   4.  Elevated Creatinine:  Taking meds more regularly. Plan to check again today.  5.  GERD:  Not taking Famotidine regularly.  Only taking when has symptoms 2-3 times weekly.  Snacking at night before bed and then does not take the med. Eating handful of nuts or yogurt in the evening.  6.  Seasonal and Environmental Allergies:  Is taking clarinex.  Has orange card.  Lot of nasal congestion, sneezing, posterior pharyngeal drainage with hoarseness.    7.  Left great toenail and skin near toenail with thickening, flaking, loss of nail.  Tried Tea Tree oil lotion, but no improvement.   Current outpatient prescriptions:  .  amLODipine (NORVASC) 5 MG tablet, Take 1 tablet (5 mg total) by mouth daily., Disp: 30 tablet, Rfl: 11 .  aspirin 81 MG tablet, Take 81 mg by mouth daily., Disp: , Rfl:  .  desloratadine (CLARINEX) 5 MG tablet, Take 1 tablet (5 mg total) by mouth daily., Disp: 30 tablet, Rfl: 11 .  famotidine (PEPCID) 20 MG tablet, 2 tabs by mouth at bedtime, Disp: 60 tablet, Rfl: 1 .  Flax Oil-Fish Oil-Borage Oil (FISH OIL-FLAX OIL-BORAGE OIL PO), Take by mouth daily. Reported on 12/28/2015, Disp: , Rfl:  .  Flaxseed,  Linseed, (FLAX SEED OIL PO), Take by mouth daily. Reported on 12/28/2015, Disp: , Rfl:  .  Garlic 100 MG TABS, Take 100 mg by mouth daily., Disp: , Rfl:  .  glimepiride (AMARYL) 4 MG tablet, Take 1 tablet (4 mg total) by mouth daily with breakfast., Disp: 30 tablet, Rfl: 11 .  lisinopril (PRINIVIL,ZESTRIL) 40 MG tablet, 1 tab by mouth once daily, Disp: 30 tablet, Rfl: 11 .  Magnesium 250 MG TABS, Take by mouth daily., Disp: , Rfl:  .  Multiple Vitamins-Minerals (MULTIVITAMIN WITH MINERALS) tablet, Take 1 tablet by mouth daily., Disp: , Rfl:  .  OVER THE COUNTER MEDICATION, , Disp: , Rfl:  .  Turmeric Curcumin 500 MG CAPS, Take 1 tablet by mouth daily., Disp: , Rfl:  .  Cinnamon 500 MG TABS, Take by mouth daily. Reported on 03/08/2016, Disp: , Rfl:  .  diphenhydrAMINE (BENADRYL) 25 MG tablet, Take 2 tablets (50 mg total) by mouth every 6 (six) hours. (Patient not taking: Reported on 03/08/2016), Disp: 40 tablet, Rfl: 0 .  mometasone (NASONEX) 50 MCG/ACT nasal spray, 2 sprays each nostril daily, Disp: 17 g, Rfl: 11 .  OVER THE COUNTER MEDICATION, Reported on 03/08/2016, Disp: , Rfl:    No Known Allergies      Review of Systems     Objective:   Physical Exam  HEENT;  PERRL, EOMI, Discs sharp.  TMs pearly gray, nasal mucosa somewhat swollen and boggy, mild cobbling, posterior pharynx Neck:  Supple, no adenopathy, no thyromegaly Chest:  CTA CV:  RRR with normal S1 and S2, No S3, S4, or murmur, carotid, radial and DP pulses normal and equal Extrems:  No edema:  Dry flaking skin between left great and second toe.  Nail thickened and yellowed   Assessment & Plan:  1.  Dental Decay:  [facility].  Pain resolved with removal of tooth  2.  Essential Hypertension:  Improved.  Encouraged more consistency with meds, though taking meds more regularly now.  To start pill box as well.  3.  DM Type 2:  Controlled on Glimepiride.  4.  GERD:  Encouraged to take  Famotidine regularly for 2 months and if symptoms resolve only as needed thereafter, as long as only symptomatic once or less weekly  5.  Allergies:  Add Nasonex through [facility], To use Fluticasone nasal spray OTC until then 2 sprays each nostril daily  6.  Toenail onychomycosis:  Liver enzymes ok in February.  Checking CMP today again for renal.  Start Terbinafine 250 mg once daily for 90 days. Follow upin 6 weeks and 3 months.  Spray shoes with Lysol daily and allow to dry.  To clean shower floor with bleach twice weekly.  7.  HM:  Will get Tdap next visit:  No record of Tdap in [place]

## 2016-03-09 LAB — COMPREHENSIVE METABOLIC PANEL
A/G RATIO: 1.4 (ref 1.2–2.2)
ALBUMIN: 4.2 g/dL (ref 3.6–4.8)
ALK PHOS: 65 IU/L (ref 39–117)
ALT: 35 IU/L — ABNORMAL HIGH (ref 0–32)
AST: 24 IU/L (ref 0–40)
BILIRUBIN TOTAL: 0.3 mg/dL (ref 0.0–1.2)
BUN / CREAT RATIO: 11 — AB (ref 12–28)
BUN: 10 mg/dL (ref 8–27)
CHLORIDE: 100 mmol/L (ref 96–106)
CO2: 21 mmol/L (ref 18–29)
CREATININE: 0.94 mg/dL (ref 0.57–1.00)
Calcium: 9.8 mg/dL (ref 8.7–10.3)
GFR calc Af Amer: 75 mL/min/{1.73_m2} (ref >59)
GFR calc non Af Amer: 65 mL/min/{1.73_m2} (ref >59)
GLOBULIN, TOTAL: 2.9 g/dL (ref 1.5–4.5)
Glucose: 93 mg/dL (ref 65–99)
POTASSIUM: 4.1 mmol/L (ref 3.5–5.2)
SODIUM: 140 mmol/L (ref 134–144)
Total Protein: 7.1 g/dL (ref 6.0–8.5)

## 2016-03-09 LAB — LIPID PANEL W/O CHOL/HDL RATIO
CHOLESTEROL TOTAL: 209 mg/dL — AB (ref 100–199)
HDL: 49 mg/dL (ref >39)
LDL CALC: 141 mg/dL — AB (ref 0–99)
Triglycerides: 96 mg/dL (ref 0–149)
VLDL CHOLESTEROL CAL: 19 mg/dL (ref 5–40)

## 2016-03-17 ENCOUNTER — Other Ambulatory Visit: Payer: No Typology Code available for payment source | Admitting: Licensed Clinical Social Worker

## 2016-03-27 ENCOUNTER — Ambulatory Visit (INDEPENDENT_AMBULATORY_CARE_PROVIDER_SITE_OTHER): Payer: Self-pay | Admitting: Licensed Clinical Social Worker

## 2016-03-27 DIAGNOSIS — F331 Major depressive disorder, recurrent, moderate: Secondary | ICD-10-CM

## 2016-03-27 NOTE — Progress Notes (Signed)
   THERAPY PROGRESS NOTE  Session Time: 60min  Participation Level: Active  Behavioral Response: Neat and Well GroomedAlertEuthymic  Type of Therapy: Individual Therapy  Treatment Goals addressed: Coping  Interventions: CBT and Supportive  Summary: Geoffery LyonsCynthia L [name] is a 60 y.o. female who presents with a positive mood and appropriate affect. [name] completed a PHQ-9 depression screener and scored a 3, indicating minimal depressive symptoms at this time. She reported that she has been feeling "more and more like herself." She reported that one of her main stressors currently is learning how to live on a fixed income for the first time, as she is living on her social security check. [name] reported that she had thought a lot about her "homework" assignment of identifying things that she liked about herself and that it had been the first time she truly considered what she liked about herself. She was able to identify multiple things about herself, both physical and personality, that she really appreciates. She shared about each positive quality and why it mattered to her. She reflected on the fact that she had not received positive affirmation from her mother during her childhood. She shared about her revelation that her own children needed to hear positive messages from her. She appeared receptive to LCSW feedback about the powerful way that she changed her upbringing and was able to be a more positive mother to her children; she shared that she had never considered it "a big deal." [name] shared about some of the ways that she is trying to catch her negative thoughts and replace them with more positive thoughts.  Suicidal/Homicidal: Nowithout intent/plan  Therapist Response: LCSW utilized supportive counseling techniques throughout the session in order to validate emotions and encourage open expression of emotion. LCSW assisted in identifying an automatic thought that has been causing distress  recently. LCSW helped examine the evidence that supports the thought and the evidence that does not support the thought. LCSW guided in reframing the irrational thought into a more positive, rational thought. LCSW provided affirmations to [name] for identifying positive characteristics about herself.   Plan: Return again in 2 weeks.  Diagnosis: Axis I: See current hospital problem list    Axis II: No diagnosis    Nilda Simmeratosha Breyanna Valera, LCSW 03/27/2016

## 2016-04-10 ENCOUNTER — Ambulatory Visit (INDEPENDENT_AMBULATORY_CARE_PROVIDER_SITE_OTHER): Payer: Self-pay | Admitting: Licensed Clinical Social Worker

## 2016-04-10 DIAGNOSIS — F331 Major depressive disorder, recurrent, moderate: Secondary | ICD-10-CM

## 2016-04-11 NOTE — Progress Notes (Signed)
   THERAPY PROGRESS NOTE  Session Time: 60min  Participation Level: Active  Behavioral Response: Neat and Well GroomedAlertEuthymic  Type of Therapy: Individual Therapy  Treatment Goals addressed: Coping  Interventions: Solution Focused  Summary: Geoffery LyonsCynthia L Capps is a 60 y.o. female who presents with a positive mood and appropriate affect. [name] shared that she has been feeling pretty good lately. She expressed disappointment with herself that she had given up her regime of walking one hour every day. She appeared receptive to LCSW feedback about the importance of switching up types of physical activity to keep it interesting. She brainstormed for activities that she would enjoy, as well as additional strategies to keep her motivated.  [name] shared memories of her two pregnancies and her feelings about having kids after having had a very difficult childhood. She reported that she never wanted to have children because she did not get enough love from her mother and also because she had to take care of her younger siblings her whole life. She expressed guilt and shame because she did not consider her daughter her own child for many months after the birth. She shared about her feelings towards her son, who was the product of rape. She shared about the difficulties in raising her children in a volatile marriage. [name] appeared receptive to Johnson & JohnsonLCSW feedback about her feelings. She reported that counseling has been very helpful for her to think through feelings from her past. [name] also reported that she has not had any major fears over the past few weeks, which was originally one of her main concerns. She shared that she has been thinking more and more about positive thoughts and feelings about herself.  Suicidal/Homicidal: Nowithout intent/plan  Therapist Response: LCSW utilized supportive counseling techniques throughout the session in order to validate emotions and encourage open expression of  emotion. LCSW normalized Valentina's difficult emotions towards her children, and provided psychoeducation about bonding and attachment. LCSW helped [name] to brainstorm for strategies to keep her motivated in regards to her weight loss goals. LCSW suggested varying her types of activities, trying out the free exercise classes at the park or senior center, and having a "fitness buddy" for accountability.  Plan: Return again in 2 weeks.  Diagnosis: Axis I: See current hospital problem list    Axis II: No diagnosis    Nilda Simmeratosha Kevaughn Ewing, LCSW 04/11/2016

## 2016-04-18 ENCOUNTER — Encounter: Payer: Self-pay | Admitting: Internal Medicine

## 2016-04-18 ENCOUNTER — Ambulatory Visit (INDEPENDENT_AMBULATORY_CARE_PROVIDER_SITE_OTHER): Payer: Self-pay | Admitting: Internal Medicine

## 2016-04-18 VITALS — BP 162/96 | HR 60 | Resp 20 | Ht 62.0 in | Wt 230.0 lb

## 2016-04-18 DIAGNOSIS — Z23 Encounter for immunization: Secondary | ICD-10-CM

## 2016-04-18 DIAGNOSIS — I10 Essential (primary) hypertension: Secondary | ICD-10-CM

## 2016-04-18 DIAGNOSIS — Z1239 Encounter for other screening for malignant neoplasm of breast: Secondary | ICD-10-CM

## 2016-04-18 DIAGNOSIS — E119 Type 2 diabetes mellitus without complications: Secondary | ICD-10-CM

## 2016-04-18 DIAGNOSIS — J309 Allergic rhinitis, unspecified: Secondary | ICD-10-CM

## 2016-04-18 DIAGNOSIS — B351 Tinea unguium: Secondary | ICD-10-CM

## 2016-04-18 LAB — GLUCOSE, POCT (MANUAL RESULT ENTRY): POC Glucose: 70 mg/dl (ref 70–99)

## 2016-04-18 NOTE — Addendum Note (Signed)
Addended by: Daphine DeutscherPEARSON, Takeysha Bonk P on: 04/18/2016 12:43 PM   Modules accepted: Orders

## 2016-04-18 NOTE — Progress Notes (Signed)
Subjective:    Patient ID: Haley [name], female    DOB: 14-Dec-1955, 60 y.o.   MRN: [id number]  HPI   1.  Toenail Onychomycosis:  Terbinafine made her a bit nauseated at first, but that has since resolved.  Started med about 2 days after last seen.  2.  Allergies:  Obtained Nasonex and has helped.  Is not using daily.  Discussed should use regularly to keep allergy symptoms under control  3.  Essential Hypertension:  Not missing meds.  When took bp at home today, BP was 138/86.    4.  DM Type 2:  Taking meds and is getting ready to get back to walking.  A1C was 6.3% in February.   5.  Hyperlipidemia:  Cholesterol a bit high with an LDL about twice what would like to see.  She has not yet restarted walking.  [facility] [employer] [facility] [facility] as bad weather interrupts her regular physical activity and takes her a while to get back at it.   Results for Haley LyonsELSON, Haley [name] (MRN [id number]) as of 04/18/2016 09:11  Ref. Range 03/08/2016 11:11  Cholesterol, Total Latest Ref Range: 100-199 mg/dL 782209 (H)  Triglycerides Latest Ref Range: 0-149 mg/dL 96  HDL Cholesterol Latest Ref Range: >39 mg/dL 49  LDL (calc) Latest Ref Range: 0-99 mg/dL 956141 (H)  VLDL Cholesterol Cal Latest Ref Range: 5-40 mg/dL 19     Current outpatient prescriptions:  .  amLODipine (NORVASC) 5 MG tablet, Take 1 tablet (5 mg total) by mouth daily., Disp: 30 tablet, Rfl: 11 .  aspirin 81 MG tablet, Take 81 mg by mouth daily., Disp: , Rfl:  .  Cinnamon 500 MG TABS, Take by mouth daily. Reported on 03/08/2016, Disp: , Rfl:  .  desloratadine (CLARINEX) 5 MG tablet, Take 1 tablet (5 mg total) by mouth daily., Disp: 30 tablet, Rfl: 11 .  famotidine (PEPCID) 20 MG tablet, 2 tabs by mouth at bedtime, Disp: 60 tablet, Rfl: 1 .  Flax Oil-Fish Oil-Borage Oil (FISH OIL-FLAX OIL-BORAGE OIL PO), Take by mouth daily. Reported on 12/28/2015, Disp: , Rfl:  .  Garlic 100 MG TABS, Take 100 mg by mouth daily., Disp: , Rfl:  .  Ginger Root  POWD, by Does not apply route daily., Disp: , Rfl:  .  glimepiride (AMARYL) 4 MG tablet, Take 1 tablet (4 mg total) by mouth daily with breakfast., Disp: 30 tablet, Rfl: 11 .  lisinopril (PRINIVIL,ZESTRIL) 40 MG tablet, 1 tab by mouth once daily, Disp: 30 tablet, Rfl: 11 .  Magnesium 250 MG TABS, Take by mouth daily., Disp: , Rfl:  .  mometasone (NASONEX) 50 MCG/ACT nasal spray, 2 sprays each nostril daily, Disp: 17 g, Rfl: 11 .  Multiple Vitamins-Minerals (MULTIVITAMIN WITH MINERALS) tablet, Take 1 tablet by mouth daily., Disp: , Rfl:  .  OVER THE COUNTER MEDICATION, , Disp: , Rfl:  .  terbinafine (LAMISIL) 250 MG tablet, Take 1 tablet (250 mg total) by mouth daily., Disp: 90 tablet, Rfl: 0 .  Turmeric Curcumin 500 MG CAPS, Take 1 tablet by mouth daily., Disp: , Rfl:  .  diphenhydrAMINE (BENADRYL) 25 MG tablet, Take 2 tablets (50 mg total) by mouth every 6 (six) hours. (Patient not taking: Reported on 03/08/2016), Disp: 40 tablet, Rfl: 0 .  Flaxseed, Linseed, (FLAX SEED OIL PO), Take by mouth daily. Reported on 04/18/2016, Disp: , Rfl:  .  OVER THE COUNTER MEDICATION, Reported on 04/18/2016, Disp: , Rfl:  Review of Systems     Objective:   Physical Exam  NAD Lungs:  CTA CV:  RRR with normal S1 and S2, No S3, S4 or murmur, radial pulses and DP pulses normal and equal. LE:  No edema  Feet very healthy appearing. Right great toenail growing out--still with significant discoloration and thickening.  Little toenail with little change. 10 g monofilament WNL       Assessment & Plan:  1.  Toenail onychomycosis:  Probable improvement.  Continue Terbinafine.    2.  Allergies:  Discussed using corticosteroid nasal spray regularly, whether daily or every other day for prevention of symptoms.  3.  Essential Hypertension:  Up a bit today, though was fine at home.  Will have her bring in her home monitor in 2 weeks with BP check here to compare and see if can go by home measurements.  4.   DM:  Has been well controlled with Glimeperide.  CPM.  Check urine microalbumin  5.  HM:  Schedule mammogram with scholarship.  Schedule CPE.  Tdap today.  6.  Hyperlipidemia:  To get to work on physical activity.  Recheck FLP, CMP day before CPE.

## 2016-04-19 LAB — MICROALBUMIN, URINE

## 2016-04-21 ENCOUNTER — Other Ambulatory Visit: Payer: No Typology Code available for payment source | Admitting: Licensed Clinical Social Worker

## 2016-04-29 ENCOUNTER — Encounter: Payer: Self-pay | Admitting: Internal Medicine

## 2016-05-02 ENCOUNTER — Encounter (HOSPITAL_COMMUNITY): Payer: Self-pay | Admitting: *Deleted

## 2016-05-02 ENCOUNTER — Other Ambulatory Visit (HOSPITAL_COMMUNITY): Payer: Self-pay | Admitting: *Deleted

## 2016-05-02 ENCOUNTER — Ambulatory Visit (INDEPENDENT_AMBULATORY_CARE_PROVIDER_SITE_OTHER): Payer: Self-pay | Admitting: Licensed Clinical Social Worker

## 2016-05-02 ENCOUNTER — Telehealth (HOSPITAL_COMMUNITY): Payer: Self-pay | Admitting: *Deleted

## 2016-05-02 ENCOUNTER — Ambulatory Visit: Payer: Self-pay

## 2016-05-02 VITALS — BP 162/100 | HR 81

## 2016-05-02 DIAGNOSIS — I10 Essential (primary) hypertension: Secondary | ICD-10-CM

## 2016-05-02 DIAGNOSIS — F331 Major depressive disorder, recurrent, moderate: Secondary | ICD-10-CM

## 2016-05-02 DIAGNOSIS — R928 Other abnormal and inconclusive findings on diagnostic imaging of breast: Secondary | ICD-10-CM

## 2016-05-02 NOTE — Progress Notes (Signed)
Patient's wrist cuff read 164/87 with a pulse of 85

## 2016-05-02 NOTE — Telephone Encounter (Signed)
Telephoned patient at home # and left message to return call to [facility] 

## 2016-05-03 NOTE — Progress Notes (Signed)
   THERAPY PROGRESS NOTE  Session Time: 60min  Participation Level: Active  Behavioral Response: Neat and Well GroomedAlertEuthymic  Type of Therapy: Individual Therapy  Treatment Goals addressed: Coping  Interventions: Supportive  Summary: Geoffery LyonsCynthia L Gellert is a 60 y.o. female who presents with a euthymic mood and appropriate affect. She reported that she has stopped her exercise routine and is currently not getting any physical activity at all. She shared that her appetite has decreased and she has to force herself to eat. She expressed excitement that she may have a job interview soon. [name] shared about some long-standing resentment and hurt that she feels towards her younger sister. She shared her anger that she had to take care of her younger sister while she was barely an adult herself, and she watched her sister do the things in life that she really wanted to do. She expressed the pain she felt driving her sister back and forth to college. She reported that she and her sister barely have a relationship now because everything turns into an argument. [name] shared that she and her sister both want the kind of life that the other has. [name] appeared receptive to Johnson & JohnsonLCSW feedback about the importance of processing through her anger and resentment in order to have a better relationship with her sister. She agreed to the "homework" assignment of writing a letter to her sister, focused on her feelings from their adolescence.    Suicidal/Homicidal: Nowithout intent/plan  Therapist Response: LCSW utilized supportive counseling techniques throughout the session in order to validate emotions and encourage open expression of emotion. LCSW and [name] processed about her negative feelings towards her sister. LCSW emphasized that their current conflict was more rooted in their past history of hurt feelings than in the present, and encouraged her to work through the feelings before approaching her  sister.  Plan: Return again in 2 weeks.  Diagnosis: Axis I: See current hospital problem list    Axis II: No diagnosis    Nilda Simmeratosha Aiesha Leland, LCSW 05/03/2016

## 2016-05-11 ENCOUNTER — Ambulatory Visit
Admission: RE | Admit: 2016-05-11 | Discharge: 2016-05-11 | Disposition: A | Discharge status: Home / Self Care | Payer: No Typology Code available for payment source | Source: Ambulatory Visit | Attending: Obstetrics and Gynecology | Admitting: Obstetrics and Gynecology

## 2016-05-11 ENCOUNTER — Encounter (HOSPITAL_COMMUNITY): Payer: Self-pay

## 2016-05-11 ENCOUNTER — Ambulatory Visit (HOSPITAL_COMMUNITY)
Admission: RE | Admit: 2016-05-11 | Discharge: 2016-05-11 | Disposition: A | Discharge status: Home / Self Care | Payer: BLUE CROSS/BLUE SHIELD | Source: Ambulatory Visit | Attending: Obstetrics and Gynecology | Admitting: Obstetrics and Gynecology

## 2016-05-11 VITALS — BP 150/82 | Temp 98.8°F | Ht 61.0 in | Wt 229.6 lb

## 2016-05-11 DIAGNOSIS — R928 Other abnormal and inconclusive findings on diagnostic imaging of breast: Secondary | ICD-10-CM

## 2016-05-11 DIAGNOSIS — Z1239 Encounter for other screening for malignant neoplasm of breast: Secondary | ICD-10-CM

## 2016-05-11 NOTE — Progress Notes (Addendum)
No complaints today. Patient's last mamm[facility] was 11/20/2011 [facility] and a 6 month bilateral diagnostic mammogram was recommended for follow-up. Patient hasn't followed up.  Pap Smear: Pap smear not completed today. Last Pap smear was 5 or 6 years ago and normal per patient. Per patient has no history of an abnormal Pap smear. Patient has a history of a hysterectomy in 1995 for uterine prolapse. Patient no longer needs Pap smears due to her history of a hysterectomy for benign reasons per [facility] and ACOG guidelines. No Pap smear results in EPIC.  Physical exam: Breasts Right breast is slightly larger than the left. Per patient that is normal for her. No skin abnormalities bilateral breasts. No nipple retraction bilateral breasts. No nipple discharge bilateral breasts. No lymphadenopathy. No lumps palpated bilateral breasts. No complaints of pain or tenderness on exam. [facility] per recommendation. Appointment scheduled for Thursday, May 11, 2016 at 1430.      Pelvic/Bimanual No Pap smear completed today since patient has a history of a hysterectomy for benign reasons. Pap smear not indicated per [facility] guidelines.   Smoking History: Patient has never smoked.  Patient Navigation: Patient education provided. Access to services provided for patient through [facility] program.   Colorectal Cancer Screening: Patient had a colonoscopy completed 12 years ago. No complaints today.

## 2016-05-11 NOTE — Patient Instructions (Signed)
Educational materials on self breast awareness given. Explained to Haley [name] that she did not need a Pap smear today due to patient has a history of hysterectomy for benign reasons. Informed patient that she doesn't need any further Pap smears due to her history of a hysterectomy for benign reasons. [facility] mamm[facility] per recommendation. Appointment scheduled for Thursday, May 11, 2016 at 1430. Haley [name] verbalized understanding.  Rainey Rodger, Kathaleen Maserhristine Poll, RN 3:35 PM

## 2016-05-12 ENCOUNTER — Encounter (HOSPITAL_COMMUNITY): Payer: Self-pay | Admitting: *Deleted

## 2016-05-12 NOTE — Addendum Note (Signed)
Encounter addended by: Priscille Heidelberghristine P Ankith Edmonston, RN on: 05/12/2016  2:35 PM<BR>     Documentation filed: Notes Section

## 2016-05-15 ENCOUNTER — Ambulatory Visit (INDEPENDENT_AMBULATORY_CARE_PROVIDER_SITE_OTHER): Payer: Self-pay | Admitting: Licensed Clinical Social Worker

## 2016-05-15 DIAGNOSIS — F331 Major depressive disorder, recurrent, moderate: Secondary | ICD-10-CM

## 2016-05-15 NOTE — Progress Notes (Signed)
   THERAPY PROGRESS NOTE  Session Time: 60min  Participation Level: Active  Behavioral Response: Casual and Well GroomedAlertEuthymic  Type of Therapy: Individual Therapy  Treatment Goals addressed: Coping  Interventions: Strength-based and Supportive  Summary: Haley LyonsCynthia L [name] is a 60 y.o. female who presents with a euthymic mood and appropriate affect. She reported that things are going well but that she is not making progress on all her goals. She shared that she is doing a better job of eating three meals per day instead of skipping. She expressed frustration towards herself for not exercising. She appeared receptive to Johnson & JohnsonLCSW feedback regarding strategies like having a workout buddy for accountability, or creating a competition with a friend. Haley [name] shared that she has been struggling some with grief, as her best friend's mother died one year ago, and Haley [name] regarded her as a mother figure. She shared that she has been so busy helping her best friend in her grief over the anniversary of the death that she has not had time to focus on her own grief. She shared that next month will be the anniversary of her own mother's death, which tends to be very hard on her.   Suicidal/Homicidal: Nowithout intent/plan  Therapist Response: LCSW utilized supportive counseling techniques throughout the session in order to validate emotions and encourage open expression of emotion. LCSW encouraged Haley [name] to use strategies that will make working out more fun, such as having a friendly competition. LCSW reflected on how Haley [name] can focus on her own grief and honor the memories of her two mothers. LCSW and Haley [name] processed about her feelings regarding not having any photos of her mother.  Plan: Return again in 2 weeks.  Diagnosis: Axis I: See current hospital problem list    Axis II: No diagnosis    Haley Simmeratosha Carlota Philley, LCSW 05/15/2016

## 2016-05-29 ENCOUNTER — Ambulatory Visit: Payer: No Typology Code available for payment source | Admitting: Licensed Clinical Social Worker

## 2016-05-29 DIAGNOSIS — F331 Major depressive disorder, recurrent, moderate: Secondary | ICD-10-CM

## 2016-05-30 NOTE — Progress Notes (Signed)
   THERAPY PROGRESS NOTE  Session Time: 62mn  Participation Level: Active  Behavioral Response: Neat and Well GroomedAlertEuthymic  Type of Therapy: Individual Therapy  Treatment Goals addressed: Coping  Interventions: Motivational Interviewing and Supportive  Summary: Haley LEFEVERSis a 60y.o. female who presents with a euthymic mood and appropriate affect. She reported that she has mostly been doing well lately. She shared that last week she spent a whole week at home without leaving once, even to go outside. She admitted that this was a troubling behavior and that she knows she needs to get out. She denied feeling depressed. She committed to going out several times per week. CCaren Griffinsand LCSW reviewed her treatment goals; she stated that she feels she has met both of her original goals, to increase self-esteem and to "stop being afraid all the time." She reported that she was at an 8 or 9 out of 10 for both goals. 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 She expressed fear and hesitancy around addressing her history of trauma. She appeared receptive to LCSW feedback about the intervention options for addressing trauma. She shared that one of the biggest reasons that she wants to tackle this issue is so that she is able to talk about her mother to her younger brother, who has no memories of her. She became tearful as she stated that "he deserves to know about his mother."  Suicidal/Homicidal: Nowithout intent/plan  Therapist Response: LCSW utilized supportive counseling techniques throughout the session in order to validate emotions and encourage open expression of emotion. LCSW and CDarnicediscussed goals for counseling sessions and identified the focus of treatment. LCSW provided affirmations to CDiavionfor doing hard work to accomplish her original goals of treatment. LCSW provided several options to trauma treatment, including the 5-part  narrative intervention and EMDR. LCSW encouraged [name] think about it for the next session.  Plan: Return again in 2 weeks.  Diagnosis: Axis I: See current hospital problem list    Axis II: No diagnosis    NMetta Clines LCSW 05/30/2016

## 2016-06-08 ENCOUNTER — Other Ambulatory Visit: Payer: No Typology Code available for payment source

## 2016-06-08 ENCOUNTER — Telehealth: Payer: Self-pay | Admitting: Internal Medicine

## 2016-06-08 NOTE — Telephone Encounter (Signed)
[facility]ill.

## 2016-06-08 NOTE — Telephone Encounter (Signed)
Patient would like refill of Rx (mometasone) Nasonex 50 mcg/act nasal.

## 2016-06-12 ENCOUNTER — Ambulatory Visit: Payer: No Typology Code available for payment source | Admitting: Licensed Clinical Social Worker

## 2016-06-12 DIAGNOSIS — F331 Major depressive disorder, recurrent, moderate: Secondary | ICD-10-CM

## 2016-06-12 NOTE — Telephone Encounter (Signed)
Pat relayed message to patient to call the health dept. Pharmacy as noted by Grover CanavanKrystal.

## 2016-06-13 NOTE — Progress Notes (Signed)
   THERAPY PROGRESS NOTE  Session Time: 60min  Participation Level: Active  Behavioral Response: Neat and Well GroomedAlertEuthymic  Type of Therapy: Individual Therapy  Treatment Goals addressed: Coping  Interventions: Motivational Interviewing and Supportive  Summary: Haley LyonsCynthia L [name] is a 60 y.o. female who presents with a euthymic mood and appropriate affect. She reported that she has been very stressed over the past week with helping friends and family. She shared about the "neediness" of her best friend right now, as her friend's mother died a few weeks ago. [name] expressed that she is also grieving the loss of her friend's mother, who she considered like a mother as well. She reported that her friend's son just attempted suicide, which has been hard on the whole family and herself. She stated that she partially blames her friend for her son's suicide attempt. She expressed anger towards her friend. [name] appeared receptive to Johnson & JohnsonLCSW feedback about anger as a part of grief and the possibility that her anger towards her friend is actually displaced anger, towards friend's son for the attempt. [name] shared that she is also angry towards her friend for continuing to be in love with a man who lives far away and does not appear to care for her. She reported that she does not want to "deal with the crying and complaining" anymore. She shared that she knows she needs more self-care time but that it has been difficult due to all the care she is doing for her family members and her friend.  Suicidal/Homicidal: Nowithout intent/plan  Therapist Response: LCSW utilized supportive counseling techniques throughout the session in order to validate emotions and encourage open expression of emotion. LCSW normalized [name]'s anger as a common part of grief, but emphasized that her anger towards her friend may be anger towards her friend's son that is displaced. LCSW and [name] processed about her anger  and how to talk with her friend about her feelings.  Plan: Return again in 2 weeks.  Diagnosis: Axis I: See current hospital problem list    Axis II: No diagnosis    Nilda Simmeratosha Markeeta Scalf, LCSW 06/13/2016

## 2016-06-19 ENCOUNTER — Other Ambulatory Visit: Payer: Self-pay

## 2016-06-19 DIAGNOSIS — J3089 Other allergic rhinitis: Secondary | ICD-10-CM

## 2016-06-19 MED ORDER — MOMETASONE FUROATE 50 MCG/ACT NA SUSP: NASAL | 11 refills | Status: DC

## 2016-06-19 MED ORDER — OLMESARTAN MEDOXOMIL 40 MG PO TABS: 40.0000 mg | ORAL_TABLET | Freq: Every day | ORAL | 11 refills | Status: DC

## 2016-06-19 NOTE — Telephone Encounter (Signed)
Fax received from [facility] requesting most recent labs and vitals. They also requested a new prescription for Nasonex and if they could switch the patient from Lisinopril to Benicar. Dr. Delrae AlfredMulberry is okay with changing the medication at the patient's request. Rx sent

## 2016-06-26 ENCOUNTER — Ambulatory Visit (INDEPENDENT_AMBULATORY_CARE_PROVIDER_SITE_OTHER): Payer: Self-pay | Admitting: Licensed Clinical Social Worker

## 2016-06-26 DIAGNOSIS — F331 Major depressive disorder, recurrent, moderate: Secondary | ICD-10-CM
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
## 2016-07-11 ENCOUNTER — Ambulatory Visit (INDEPENDENT_AMBULATORY_CARE_PROVIDER_SITE_OTHER): Payer: Self-pay | Admitting: Licensed Clinical Social Worker

## 2016-07-11 DIAGNOSIS — F331 Major depressive disorder, recurrent, moderate: Secondary | ICD-10-CM
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
## 2016-07-17 ENCOUNTER — Other Ambulatory Visit: Payer: No Typology Code available for payment source

## 2016-07-18 ENCOUNTER — Encounter: Payer: Self-pay | Admitting: Internal Medicine

## 2016-07-18 ENCOUNTER — Ambulatory Visit (INDEPENDENT_AMBULATORY_CARE_PROVIDER_SITE_OTHER): Payer: No Typology Code available for payment source | Admitting: Internal Medicine

## 2016-07-18 ENCOUNTER — Other Ambulatory Visit: Payer: Self-pay | Admitting: Licensed Clinical Social Worker

## 2016-07-18 VITALS — BP 128/72 | HR 64 | Resp 16 | Ht 61.5 in | Wt 232.0 lb

## 2016-07-18 DIAGNOSIS — Z79899 Other long term (current) drug therapy: Secondary | ICD-10-CM

## 2016-07-18 DIAGNOSIS — E119 Type 2 diabetes mellitus without complications: Secondary | ICD-10-CM

## 2016-07-18 DIAGNOSIS — I10 Essential (primary) hypertension: Secondary | ICD-10-CM

## 2016-07-18 DIAGNOSIS — E785 Hyperlipidemia, unspecified: Secondary | ICD-10-CM

## 2016-07-18 LAB — GLUCOSE, POCT (MANUAL RESULT ENTRY): POC GLUCOSE: 101 mg/dL — AB (ref 70–99)

## 2016-07-18 MED ORDER — AMOXICILLIN-POT CLAVULANATE 875-125 MG PO TABS: 1.0000 | ORAL_TABLET | Freq: Two times a day (BID) | ORAL | 0 refills | Status: DC

## 2016-07-18 MED ORDER — TRIAMCINOLONE ACETONIDE 0.1 % EX CREA: TOPICAL_CREAM | CUTANEOUS | 0 refills | Status: DC

## 2016-07-18 MED ORDER — LISINOPRIL 40 MG PO TABS: 40.0000 mg | ORAL_TABLET | Freq: Every day | ORAL | 11 refills | Status: DC

## 2016-07-18 NOTE — Progress Notes (Signed)
Subjective:    Patient ID: Haley [name], female    DOB: December 14, 1955, 60 y.o.   MRN: [id number]  HPI   Here for CPE, no pap, S/P Hysterectomy with ovaries remaining  1.  Pap smears:  All normal in past  2.  Last Mammogram:  Stable bilateral asymmetries.  Follow up in 1 year recommended 7/6/217  3.  Guaiac Cards:  Last several years ago.  4.  Colonoscopy:  [facility] at age 89 yo.  Had polyps, but reportedly not adenomatous.  Was to follow up at age 33 yo.  5.  Osteoprevention:  Occasional milk.  3 servings of cheese daily.  Yogurt every other day.  Has never had a DEXA.  Immunization History  Administered Date(s) Administered  . Influenza,inj,Quad PF,36+ Mos 12/28/2015  . Pneumococcal Polysaccharide-23 08/13/1996, 07/22/2013  . Td 11/12/1996  . Tdap 04/18/2016   Past Medical History:  Diagnosis Date  . Diabetes mellitus without complication (HCC)   . Hyperlipidemia   . Hypertension   . Shingles    Past Surgical History:  Procedure Laterality Date  . ABDOMINAL HYSTERECTOMY  1994   Laparoscopic  . CHOLECYSTECTOMY  1997   Laparoscopic  . WISDOM TOOTH EXTRACTION     Family History  Problem Relation Age of Onset  . Hypertension Father   . Stroke Maternal Aunt     Multiple aunts died from strokes-maternal  . Diabetes Brother   . Hypertension Brother   . Hypertension Son   . Seizures Brother    Social History   Social History  . Marital status: Divorced    Spouse name: N/A  . Number of children: 2  . Years of education: 12+   Occupational History  . Print production planner     Retired now   Social History Main Topics  . Smoking status: Never Smoker  . Smokeless tobacco: Never Used  . Alcohol use 0.0 oz/week     Comment: occasionally  . Drug use: No  . Sexual activity: No   Other Topics Concern  . Not on file   Social History Narrative   Did get some post high school training/education   Lives alone           Review of Systems    Constitutional: Negative for appetite change, fatigue and unexpected weight change.       Trying to be physically active  HENT: Positive for congestion, sore throat and tinnitus. Negative for hearing loss.        Tinnitus is occasional and short lived  Eyes: Negative for visual disturbance.       Wears bifocals--stable Last eye exam was 2-3 months ago.  No diabetic change. Mild cataract formation.  Respiratory: Negative for cough and shortness of breath.   Cardiovascular: Negative for chest pain, palpitations and leg swelling.  Gastrointestinal: Negative for blood in stool, constipation, diarrhea, nausea and vomiting.       Occasional LLQ pain.  Short lived. No melena  Genitourinary: Positive for frequency. Negative for dysuria, hematuria, urgency and vaginal discharge.  Musculoskeletal: Positive for arthralgias. Negative for gait problem and joint swelling.       Mild joint aching--elbows, knees, left shoulder.  No swelling or redness  Skin: Positive for rash.       Pustules on upper outer arms recently--resolved with application of castor oil. Also itchy bumps in between fingers and at lateral ankles.  Has been having allergies recently.  Neurological: Negative for dizziness, syncope, weakness and  numbness.  Hematological: Negative for adenopathy. Does not bruise/bleed easily.  Psychiatric/Behavioral: Positive for sleep disturbance. Negative for dysphoric mood. The patient is not nervous/anxious.        Still working with Samul DadaN. KNight, LCSW  Lies in bed and wills herself to sleep--but just lies there.       Objective:   Physical Exam  Constitutional: She is oriented to person, place, and time. She appears well-developed.  Obese  HENT:  Head: Normocephalic and atraumatic.  Right Ear: Hearing, tympanic membrane, external ear and ear canal normal.  Left Ear: Hearing, tympanic membrane, external ear and ear canal normal.  Mouth/Throat: Uvula is midline, oropharynx is clear and moist  and mucous membranes are normal.  Nasal mucosa swollen and boggy Missing teeth  Eyes: Conjunctivae and EOM are normal. Pupils are equal, round, and reactive to light.  Discs sharp bilaterally.  No exudates or hemorrhages  Neck: Normal range of motion. Neck supple. No thyroid mass and no thyromegaly present.  Cardiovascular: Normal rate, regular rhythm and normal pulses.  Exam reveals no friction rub.   No murmur heard. Pulmonary/Chest: Effort normal and breath sounds normal. Right breast exhibits no inverted nipple, no mass, no nipple discharge, no skin change and no tenderness. Left breast exhibits no inverted nipple, no mass, no nipple discharge, no skin change and no tenderness.  Large, pendulous breasts  Abdominal: Soft. Bowel sounds are normal. She exhibits no mass. There is no hepatosplenomegaly. There is tenderness. A hernia is present. Hernia confirmed positive in the ventral area. Hernia confirmed negative in the right inguinal area and confirmed negative in the left inguinal area.    Genitourinary: Vagina normal. Rectal exam shows external hemorrhoid. Rectal exam shows guaiac negative stool.    No tenderness in the vagina. No vaginal discharge found.  Genitourinary Comments: No adenexal mass or tenderness on bimanual exam  Musculoskeletal: Normal range of motion.  Lymphadenopathy:       Head (right side): No submental and no submandibular adenopathy present.       Head (left side): No submental and no submandibular adenopathy present.       Right cervical: No superficial cervical, no deep cervical and no posterior cervical adenopathy present.      Left cervical: No superficial cervical, no deep cervical and no posterior cervical adenopathy present.       Right: No inguinal and no supraclavicular adenopathy present.       Left: No inguinal and no supraclavicular adenopathy present.  Neurological: She is alert and oriented to person, place, and time. She has normal strength and  normal reflexes. No cranial nerve deficit or sensory deficit. Coordination and gait normal.  Skin: Skin is warm and dry.  Skin colored 2mm papule/bump in interdigital areas, lateral low ankles below lateral malleolus  Psychiatric: She has a normal mood and affect. Her speech is normal and behavior is normal. Judgment and thought content normal. Cognition and memory are normal.          Assessment & Plan:  1.  CPE Immunizations up to date. [facility] Fasting labs drawn today:  CBC, CMP, A1C, FLP, POCT glucose Mammogram up to date Unable to find low cost screening colonoscopy.  To return guaiac cards in 2 weeks. Encouraged increasing physical activity  2.  Left buttock infected ?epidermoid cyst:  Augmentin 875/125 mg twice daily for 10 days with follow up in 2 weeks.  Warm compresses twice daily.  Will likely need  surgical referral for excision of cyst.  Appears very irritating.  3.  Eczema:  Triamcinolone 0.1% cream twice daily to areas of rash as needed.  To continue hydrating lotion with tea tree oil twice daily all over.  4.  DM:  To continue to work on lifestyle changes for weight loss and tighter DM control.  5.  Essential Hypertension:  BP much improved  6.  Dysthymia:  Doing quite well.  Continues to work with Samul Dada, LCSW  7.  Allergies:  Encouraged getting Nasonex OTC 2 sprays each nostril daily until [facility] comes in.  8.  Ventral Hernia:  To relax on back and allow abdominal muscles to relax when has discomfort in this area.  Unable to feel definitive abdominal wall opening due to size, but obvious reduction of mass in this area with gentle massage in.

## 2016-07-18 NOTE — Patient Instructions (Addendum)
Can google "advance directives, [place]"  And bring up form from [place]. Print and fill out Or can go to "5 wishes"  Which is also in Spanish and fill out--this costs $5--perhaps easier to use. Designate a CytogeneticistMedical Power of Attorney to speak for you if you are unable to speak for yourself when ill or injured  Return stool cards in 2 weeks.  Keep treating shower floor and shoes.  Warm compress to bottom twice daily about 1/2 hour after taking antibiotic

## 2016-07-19 LAB — CBC WITH DIFFERENTIAL/PLATELET
Basophils Absolute: 0.1 10*3/uL (ref 0.0–0.2)
Basos: 1 %
EOS (ABSOLUTE): 0.2 10*3/uL (ref 0.0–0.4)
EOS: 4 %
Hematocrit: 38.6 % (ref 34.0–46.6)
Hemoglobin: 12.7 g/dL (ref 11.1–15.9)
IMMATURE GRANULOCYTES: 0 %
Immature Grans (Abs): 0 10*3/uL (ref 0.0–0.1)
Lymphocytes Absolute: 2 10*3/uL (ref 0.7–3.1)
Lymphs: 36 %
MCH: 30.8 pg (ref 26.6–33.0)
MCHC: 32.9 g/dL (ref 31.5–35.7)
MCV: 94 fL (ref 79–97)
MONOS ABS: 0.6 10*3/uL (ref 0.1–0.9)
Monocytes: 12 %
NEUTROS PCT: 47 %
Neutrophils Absolute: 2.6 10*3/uL (ref 1.4–7.0)
PLATELETS: 267 10*3/uL (ref 150–379)
RBC: 4.12 x10E6/uL (ref 3.77–5.28)
RDW: 13.6 % (ref 12.3–15.4)
WBC: 5.5 10*3/uL (ref 3.4–10.8)

## 2016-07-19 LAB — COMPREHENSIVE METABOLIC PANEL
ALBUMIN: 3.9 g/dL (ref 3.6–4.8)
ALK PHOS: 63 IU/L (ref 39–117)
ALT: 30 IU/L (ref 0–32)
AST: 30 IU/L (ref 0–40)
Albumin/Globulin Ratio: 1.4 (ref 1.2–2.2)
BILIRUBIN TOTAL: 0.4 mg/dL (ref 0.0–1.2)
BUN / CREAT RATIO: 10 — AB (ref 12–28)
BUN: 10 mg/dL (ref 8–27)
CHLORIDE: 103 mmol/L (ref 96–106)
CO2: 23 mmol/L (ref 18–29)
Calcium: 9.5 mg/dL (ref 8.7–10.3)
Creatinine, Ser: 1.05 mg/dL — ABNORMAL HIGH (ref 0.57–1.00)
GFR calc Af Amer: 66 mL/min/{1.73_m2} (ref >59)
GFR calc non Af Amer: 57 mL/min/{1.73_m2} — ABNORMAL LOW (ref >59)
GLOBULIN, TOTAL: 2.7 g/dL (ref 1.5–4.5)
Glucose: 99 mg/dL (ref 65–99)
Potassium: 4 mmol/L (ref 3.5–5.2)
SODIUM: 141 mmol/L (ref 134–144)
Total Protein: 6.6 g/dL (ref 6.0–8.5)

## 2016-07-19 LAB — LIPID PANEL W/O CHOL/HDL RATIO
CHOLESTEROL TOTAL: 200 mg/dL — AB (ref 100–199)
HDL: 49 mg/dL (ref >39)
LDL Calculated: 128 mg/dL — ABNORMAL HIGH (ref 0–99)
TRIGLYCERIDES: 113 mg/dL (ref 0–149)
VLDL Cholesterol Cal: 23 mg/dL (ref 5–40)

## 2016-07-19 LAB — HGB A1C W/O EAG: Hgb A1c MFr Bld: 6.3 % — ABNORMAL HIGH (ref 4.8–5.6)

## 2016-07-19 MED ORDER — ATORVASTATIN CALCIUM 10 MG PO TABS: 10.0000 mg | ORAL_TABLET | Freq: Every day | ORAL | 11 refills | Status: DC

## 2016-07-19 NOTE — Addendum Note (Signed)
Addended by: Marcene DuosMULBERRY, Ebrahim Deremer M on: 07/19/2016 09:12 AM   Modules accepted: Orders

## 2016-07-25 ENCOUNTER — Ambulatory Visit (INDEPENDENT_AMBULATORY_CARE_PROVIDER_SITE_OTHER): Payer: Self-pay | Admitting: Licensed Clinical Social Worker

## 2016-07-25 ENCOUNTER — Other Ambulatory Visit (INDEPENDENT_AMBULATORY_CARE_PROVIDER_SITE_OTHER): Payer: Self-pay | Admitting: Internal Medicine

## 2016-07-25 DIAGNOSIS — Z1211 Encounter for screening for malignant neoplasm of colon: Secondary | ICD-10-CM

## 2016-07-25 DIAGNOSIS — F331 Major depressive disorder, recurrent, moderate: Secondary | ICD-10-CM

## 2016-07-25 LAB — POC HEMOCCULT BLD/STL (HOME/3-CARD/SCREEN)
FECAL OCCULT BLD: NEGATIVE
FECAL OCCULT BLD: NEGATIVE
FECAL OCCULT BLD: NEGATIVE
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
## 2016-07-28 ENCOUNTER — Telehealth: Payer: Self-pay | Admitting: Internal Medicine

## 2016-07-28 MED ORDER — FLUCONAZOLE 150 MG PO TABS: ORAL_TABLET | ORAL | 0 refills | Status: DC

## 2016-07-28 NOTE — Telephone Encounter (Signed)
To MD

## 2016-07-28 NOTE — Telephone Encounter (Signed)
Patient left a voicemail stating she thinks she might have a yeast infection and would like for Dr. Delrae AlfredMulberry to prescribed something for it.  Patient states she usually gets yeast infection after she starts taking antibiotics and Dr. Delrae AlfredMulberry prescribed antibiotics on last visit . Please advise

## 2016-08-01 ENCOUNTER — Ambulatory Visit (INDEPENDENT_AMBULATORY_CARE_PROVIDER_SITE_OTHER): Payer: Self-pay | Admitting: Internal Medicine

## 2016-08-01 ENCOUNTER — Encounter: Payer: Self-pay | Admitting: Internal Medicine

## 2016-08-01 VITALS — BP 138/78 | HR 64 | Resp 16 | Ht 61.0 in | Wt 219.0 lb

## 2016-08-01 DIAGNOSIS — Z23 Encounter for immunization: Secondary | ICD-10-CM

## 2016-08-01 DIAGNOSIS — R0609 Other forms of dyspnea: Secondary | ICD-10-CM

## 2016-08-01 DIAGNOSIS — R0789 Other chest pain: Secondary | ICD-10-CM

## 2016-08-01 DIAGNOSIS — E119 Type 2 diabetes mellitus without complications: Secondary | ICD-10-CM

## 2016-08-01 DIAGNOSIS — E782 Mixed hyperlipidemia: Secondary | ICD-10-CM

## 2016-08-01 DIAGNOSIS — E785 Hyperlipidemia, unspecified: Secondary | ICD-10-CM

## 2016-08-01 LAB — GLUCOSE, POCT (MANUAL RESULT ENTRY): POC GLUCOSE: 119 mg/dL — AB (ref 70–99)

## 2016-08-01 MED ORDER — NITROGLYCERIN 0.4 MG SL SUBL: 0.4000 mg | SUBLINGUAL_TABLET | SUBLINGUAL | 0 refills | Status: DC | PRN

## 2016-08-01 MED ORDER — NITROGLYCERIN 0.4 MG SL SUBL: SUBLINGUAL_TABLET | SUBLINGUAL | 0 refills | Status: DC
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
## 2016-08-01 NOTE — Patient Instructions (Signed)
Avoid stressful situations, both physical and emotional

## 2016-08-02 ENCOUNTER — Encounter: Payer: Self-pay | Admitting: Internal Medicine

## 2016-08-02 MED ORDER — NITROGLYCERIN 0.4 MG SL SUBL: SUBLINGUAL_TABLET | SUBLINGUAL | 0 refills | Status: DC

## 2016-08-03 ENCOUNTER — Ambulatory Visit (INDEPENDENT_AMBULATORY_CARE_PROVIDER_SITE_OTHER): Payer: No Typology Code available for payment source | Admitting: Cardiology

## 2016-08-03 ENCOUNTER — Encounter: Payer: Self-pay | Admitting: Cardiology

## 2016-08-03 VITALS — BP 124/72 | HR 92 | Ht 61.0 in | Wt 231.0 lb

## 2016-08-03 DIAGNOSIS — E785 Hyperlipidemia, unspecified: Secondary | ICD-10-CM

## 2016-08-03 DIAGNOSIS — R002 Palpitations: Secondary | ICD-10-CM

## 2016-08-03 DIAGNOSIS — I10 Essential (primary) hypertension: Secondary | ICD-10-CM

## 2016-08-03 DIAGNOSIS — R079 Chest pain, unspecified: Secondary | ICD-10-CM | POA: Insufficient documentation

## 2016-08-03 DIAGNOSIS — R072 Precordial pain: Secondary | ICD-10-CM

## 2016-08-03 DIAGNOSIS — R0609 Other forms of dyspnea: Secondary | ICD-10-CM

## 2016-08-03 NOTE — Progress Notes (Signed)
Cardiology Office Note    Date:  08/03/2016   ID:  Haley [name], DOB July 12, 1956, MRN [id number]  PCP:  Julieanne Manson, MD  Cardiologist:   Tobias Alexander, MD   Chief complaint: Chest pain, dyspnea on exertion.  History of Present Illness:   60 year old female with prior medical history of obesity, hypertension, well-controlled known insulin-dependent diabetes mellitus with latest hemoglobin A1c of 5.8 %. The patient is coming with concern of palpitations that happen almost daily and are sometimes associated with dizziness but she never experienced syncopal episodes. Her palpitations usually last seconds but on occasion up to minutes and gets when she becomes symptomatic. The patient is also experiencing worsening dyspnea on exertion, activity of daily is leaving like walking stairs would make her feel short of breath. She feels this is progressively worsening. The patient also states that she has been on blood pressure medication for years but lately her primary care physician had problems controlling diet as her blood pressure keeps increasing despite her being compliant to her meds. She states that today's measurement has been well as it has been in the file and it's usually always elevated. She is also experiencing overall fatigued and mild lower extremity edema.  The patient is coming after 1 months. She underwent an echocardiogram that showed normal biventricular function no significant valvular abnormality and mild LVH. The patient also had a stress test that was done which is a Lexiscan into no prior scar or ischemia. Her labs were all normal including TSH.  08/03/2016 - patient is coming after 2 years, she states that she has developed exertional dyspnea and chest pressure especially while she's walking stairs. She also had 3 episodes of abdominal pain that progressed to chest pain associated with shortness of breath and left her with significant fatigue when she was able to  sleep. She continues to have infrequent palpitations but no syncope. No orthopnea proximal nocturnal dyspnea or lower extremity edema. She has stopped taking atorvastatin about a year ago.   Past Medical History:  Diagnosis Date  . Diabetes mellitus without complication (HCC)   . Hyperlipidemia   . Hypertension   . Shingles     Past Surgical History:  Procedure Laterality Date  . ABDOMINAL HYSTERECTOMY  1994   Laparoscopic  . CHOLECYSTECTOMY  1997   Laparoscopic  . WISDOM TOOTH EXTRACTION      Current Medications: Outpatient Medications Prior to Visit  Medication Sig Dispense Refill  . amLODipine (NORVASC) 5 MG tablet Take 1 tablet (5 mg total) by mouth daily. 30 tablet 11  . aspirin 81 MG tablet Take 81 mg by mouth daily.    Marland Kitchen atorvastatin (LIPITOR) 10 MG tablet Take 1 tablet (10 mg total) by mouth daily. 30 tablet 11  . Cinnamon 500 MG TABS Take by mouth daily. Reported on 03/08/2016    . desloratadine (CLARINEX) 5 MG tablet Take 1 tablet (5 mg total) by mouth daily. 30 tablet 11  . Flax Oil-Fish Oil-Borage Oil (FISH OIL-FLAX OIL-BORAGE OIL PO) Take by mouth daily. Reported on 12/28/2015    . Flaxseed, Linseed, (FLAX SEED OIL PO) Take by mouth daily. Reported on 04/18/2016    . Garlic 100 MG TABS Take 100 mg by mouth daily.    . Ginger Root POWD by Does not apply route daily. Reported on 05/11/2016    . glimepiride (AMARYL) 4 MG tablet Take 1 tablet (4 mg total) by mouth daily with breakfast. 30 tablet 11  . lisinopril (PRINIVIL,ZESTRIL) 40 MG  tablet Take 1 tablet (40 mg total) by mouth daily. 30 tablet 11  . Magnesium 250 MG TABS Take by mouth daily.    . mometasone (NASONEX) 50 MCG/ACT nasal spray 2 sprays each nostril daily 17 g 11  . Multiple Vitamins-Minerals (MULTIVITAMIN WITH MINERALS) tablet Take 1 tablet by mouth daily.    . nitroGLYCERIN (NITROSTAT) 0.4 MG SL tablet 1 tab under tongue for chest pain.  May repeat every 5 minutes x 2 if chest pain not relieved 25 tablet 0  .  triamcinolone cream (KENALOG) 0.1 % Apply scant amount to affected area twice daily as needed 80 g 0  . Turmeric Curcumin 500 MG CAPS Take 1 tablet by mouth daily. Reported on 05/11/2016    . OVER THE COUNTER MEDICATION Reported on 05/11/2016    . OVER THE COUNTER MEDICATION Reported on 05/11/2016     No facility-administered medications prior to visit.      Allergies:   Review of patient's allergies indicates no known allergies.   Social History   Social History  . Marital status: Divorced    Spouse name: N/A  . Number of children: 2  . Years of education: 12+   Occupational History  . Print production planner     Retired now   Social History Main Topics  . Smoking status: Never Smoker  . Smokeless tobacco: Never Used  . Alcohol use 0.0 oz/week     Comment: occasionally  . Drug use: No  . Sexual activity: No   Other Topics Concern  . Not on file   Social History Narrative   Did get some post high school training/education   Lives alone     Family History:  The patient's family history includes Diabetes in her brother; Hypertension in her brother, father, and son; Seizures in her brother; Stroke in her maternal aunt.   ROS:   Please see the history of present illness.    ROS All other systems reviewed and are negative.   PHYSICAL EXAM:   VS:  BP 124/72   Pulse 92   Ht 5\' 1"  (1.549 m)   Wt 231 lb (104.8 kg)   BMI 43.65 kg/m    GEN: Obese, well developed, in no acute distress  HEENT: normal  Neck: no JVD, carotid bruits, or masses Cardiac: RRR; no murmurs, rubs, or gallops,no edema  Respiratory:  clear to auscultation bilaterally, normal work of breathing GI: soft, nontender, nondistended, + BS MS: no deformity or atrophy  Skin: warm and dry, no rash Neuro:  Alert and Oriented x 3, Strength and sensation are intact Psych: euthymic mood, full affect  Wt Readings from Last 3 Encounters:  08/03/16 231 lb (104.8 kg)  08/01/16 219 lb (99.3 kg)  07/18/16 232 lb (105.2 kg)      Studies/Labs Reviewed:   EKG:  Sinus rhythm, right axis deviation, nonspecific ST-T wave abnormality unchanged from prior.  Recent Labs: 07/18/2016: ALT 30; BUN 10; Creatinine, Ser 1.05; Platelets 267; Potassium 4.0; Sodium 141   Lipid Panel    Component Value Date/Time   CHOL 200 (H) 07/18/2016 0837   TRIG 113 07/18/2016 0837   HDL 49 07/18/2016 0837   LDLCALC 128 (H) 07/18/2016 0837   LDLDIRECT 134 (H) 07/22/2013 1620   TTE 04/07/2014  - Left ventricle: The cavity size was normal. Wall thickness was increased in a pattern of mild LVH. The estimated ejection fraction was 55%. Wall motion was normal; there were no regional wall motion abnormalities. Doppler parameters  are consistent with abnormal left ventricular relaxation (grade 1 diastolic dysfunction). - Aortic valve: There was no stenosis. - Mitral valve: There was no significant regurgitation. - Right ventricle: The cavity size was normal. Systolic function was normal. - Pulmonary arteries: No complete TR doppler jet so unable to estimate PA systolic pressure. - Inferior vena cava: The vessel was normal in size. The respirophasic diameter changes were in the normal range (>= 50%), consistent with normal central venous pressure.  Impressions:  - Normal LV size with mild LV hypertrophy, EF 55%. Normal RV size and systolic function. No significant valvular abnormalities.    ASSESSMENT:    1. Essential hypertension   2. Dyspnea on exertion   3. Palpitation   4. Hyperlipidemia   5. Precordial pain      PLAN:   1. Exertional dyspnea and chest pain, negative stress test in the past, we will order coronary CTA.  2. Hypertensive heart disease we doubt CHF - well controlled.  3. Hyperlipidemia, with LDL of 128 in a diabetic person, strongly encourage to restart taking atorvastatin 10 mg daily. She is very hesitant as she read controversial opinions on Internet however she agrees to it.  4.  Palpitations that are associated with dizziness - we will order a 48-hour Holter monitor to evaluate for arrhythmias. All labs normal Holter monitor didn't show anything else other than few PVCs.   Followup in 6 weeks.   Medication Adjustments/Labs and Tests Ordered: Current medicines are reviewed at length with the patient today.  Concerns regarding medicines are outlined above.  Medication changes, Labs and Tests ordered today are listed in the Patient Instructions below. Patient Instructions  Medication Instructions:   PLEASE START TAKING YOUR ATORVASTATIN AS PRESCRIBED    Testing/Procedures:  DR Kye HAS ORDERED FOR YOU TO HAVE A CORONARY CT DONE--FOR HER TO READ ON HER READER DAY    Follow-Up:  DR West'S NEXT AVAILABLE APPOINTMENT, WITH YOUR CORONARY CT DONE PRIOR TO THIS       If you need a refill on your cardiac medications before your next appointment, please call your pharmacy.      Signed, Tobias AlexanderKatarina Micheals, MD  08/03/2016 9:41 AM    [facility] [address], [place], [place]  [address] Phone: [phone]; Fax: [phone]

## 2016-08-03 NOTE — Patient Instructions (Signed)
Medication Instructions:   PLEASE START TAKING YOUR ATORVASTATIN AS PRESCRIBED    Testing/Procedures:  DR Jaffer HAS ORDERED FOR YOU TO HAVE A CORONARY CT DONE--FOR HER TO READ ON HER READER DAY    Follow-Up:  DR Modeste'S NEXT AVAILABLE APPOINTMENT, WITH YOUR CORONARY CT DONE PRIOR TO THIS       If you need a refill on your cardiac medications before your next appointment, please call your pharmacy.

## 2016-08-09 ENCOUNTER — Ambulatory Visit (INDEPENDENT_AMBULATORY_CARE_PROVIDER_SITE_OTHER): Payer: Self-pay | Admitting: Internal Medicine

## 2016-08-09 ENCOUNTER — Ambulatory Visit (INDEPENDENT_AMBULATORY_CARE_PROVIDER_SITE_OTHER): Payer: BLUE CROSS/BLUE SHIELD | Admitting: Licensed Clinical Social Worker

## 2016-08-09 ENCOUNTER — Encounter: Payer: Self-pay | Admitting: Internal Medicine

## 2016-08-09 VITALS — BP 152/88 | HR 72 | Resp 16 | Ht 61.0 in | Wt 234.0 lb

## 2016-08-09 DIAGNOSIS — L720 Epidermal cyst: Secondary | ICD-10-CM

## 2016-08-09 DIAGNOSIS — R072 Precordial pain: Secondary | ICD-10-CM

## 2016-08-09 DIAGNOSIS — F331 Major depressive disorder, recurrent, moderate: Secondary | ICD-10-CM
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
## 2016-08-09 NOTE — Progress Notes (Addendum)
   Subjective:    Patient ID: Haley [name], female    DOB: 06/13/1956, 60 y.o.   MRN: [id number]  HPI  1.  Infected cyst left ischial area.  Much better at completion of antibiotics.  No drainage this week.  Not sure if still with an opening.  2.  Chest Pain:  Pt. To have a cardiac cath on Oct 9th.  Feels really fatigued with what later turns out to be Atorvastatin.  She initially thought it was a new medication for bp.  Previously took Carvedilol without this fatigue and would like to switch.   Once realized she was talking about the Atorvastatin, discussed these were different medications for different health issues. Has upcoming Coronary CT planned with Cardiology.  No chest pain since last seen  Current Meds  Medication Sig  . amLODipine (NORVASC) 5 MG tablet Take 1 tablet (5 mg total) by mouth daily.  Marland Kitchen. aspirin 81 MG tablet Take 81 mg by mouth daily.  Marland Kitchen. atorvastatin (LIPITOR) 10 MG tablet Take 1 tablet (10 mg total) by mouth daily.   No Known Allergies    Review of Systems     Objective:   Physical Exam   Lungs;  CTA CV:  RRR without murmur or rub, radial and DP pulses normal and equal LE:  No edema Left ischial area/posteriormedial thigh:  No erythema or drainage.  Thickened skin around opening with 1.5 cm cystic lesion palpated within skin layer.  Minimal tenderness.        Assessment & Plan:  1.  Probable Epidermoid cyst of left posteromedial thigh.  Await outcome of cardiac catheterization.  Because of this being close to genital area and in a very vascular area, would prefer general surgery performing. [facility].  2.  Chest Pain:  Coronary CT as per Cardiology, Dr.  Delton SeeNelson.

## 2016-08-09 NOTE — Patient Instructions (Signed)
Call once your cardiac status is determined and we can talk about sending you somewhere to have the cyst removed from your left buttock.

## 2016-08-11 ENCOUNTER — Telehealth: Payer: Self-pay | Admitting: Internal Medicine

## 2016-08-11 NOTE — Telephone Encounter (Signed)
Patient called stating Atorvastatin is starting to affect her muscles and would like advise on what she needs to do.

## 2016-08-14 ENCOUNTER — Ambulatory Visit (HOSPITAL_COMMUNITY)
Admission: RE | Admit: 2016-08-14 | Discharge: 2016-08-14 | Disposition: A | Discharge status: Home / Self Care | Payer: BLUE CROSS/BLUE SHIELD | Source: Ambulatory Visit | Attending: Cardiology | Admitting: Cardiology

## 2016-08-14 DIAGNOSIS — E785 Hyperlipidemia, unspecified: Secondary | ICD-10-CM | POA: Insufficient documentation

## 2016-08-14 DIAGNOSIS — R0609 Other forms of dyspnea: Secondary | ICD-10-CM | POA: Insufficient documentation

## 2016-08-14 DIAGNOSIS — R072 Precordial pain: Secondary | ICD-10-CM | POA: Diagnosis not present

## 2016-08-14 DIAGNOSIS — I2510 Atherosclerotic heart disease of native coronary artery without angina pectoris: Secondary | ICD-10-CM | POA: Diagnosis not present

## 2016-08-14 DIAGNOSIS — I10 Essential (primary) hypertension: Secondary | ICD-10-CM | POA: Diagnosis present

## 2016-08-14 DIAGNOSIS — R002 Palpitations: Secondary | ICD-10-CM | POA: Insufficient documentation

## 2016-08-14 DIAGNOSIS — R079 Chest pain, unspecified: Secondary | ICD-10-CM

## 2016-08-14 MED ORDER — IOPAMIDOL (ISOVUE-370) INJECTION 76%
INTRAVENOUS | Status: AC
  Administered 2016-08-14: 80 mL via INTRAVENOUS
  Filled 2016-08-14: qty 100

## 2016-08-14 MED ORDER — METOPROLOL TARTRATE 5 MG/5ML IV SOLN
5.0000 mg | Freq: Once | INTRAVENOUS | Status: DC
  Filled 2016-08-14: qty 5

## 2016-08-14 MED ORDER — NITROGLYCERIN 0.4 MG SL SUBL
SUBLINGUAL_TABLET | SUBLINGUAL | Status: AC
  Filled 2016-08-14: qty 2

## 2016-08-14 MED ORDER — METOPROLOL TARTRATE 5 MG/5ML IV SOLN
5.0000 mg | Freq: Once | INTRAVENOUS | Status: AC
  Administered 2016-08-14: 5 mg via INTRAVENOUS
  Filled 2016-08-14: qty 5

## 2016-08-14 MED ORDER — METOPROLOL TARTRATE 5 MG/5ML IV SOLN
INTRAVENOUS | Status: AC
  Filled 2016-08-14: qty 5

## 2016-08-14 MED ORDER — NITROGLYCERIN 0.4 MG SL SUBL
0.8000 mg | SUBLINGUAL_TABLET | Freq: Once | SUBLINGUAL | Status: AC
  Administered 2016-08-14: 0.8 mg via SUBLINGUAL
  Filled 2016-08-14: qty 25

## 2016-08-14 NOTE — Telephone Encounter (Signed)
Called and left message to stop Atorvastatin, but okay to call back in and give us some more details about her symptoms.

## 2016-08-14 NOTE — Telephone Encounter (Signed)
Patient returned call.  STates she is having pain all over, but particularly legs.  Had coronary CT today.  To stop Atorvastatin and see if pain resolves.

## 2016-08-16 ENCOUNTER — Telehealth: Payer: Self-pay | Admitting: *Deleted

## 2016-08-16 NOTE — Telephone Encounter (Signed)
Called the pt to inform her that per Dr Delton SeeNelson, her coronary cta showed  that she has diffuse moderate plaque but no severe plaque that requires revascularization.  Informed the pt that per Dr Delton SeeNelson, she recommends that she really needs to take her atorvastatin, ideally 20 mg po daily.  Informed the pt that Dr Delton SeeNelson is aware that she started her on 10 mg of this med, but she ideally would like to increase this to 20 mg po daily, if she is willing to take this dose.  Per the pt, she states that she stopped taking her atorvastatin 10 mg po daily as of yesterday, for this is causing her really bad lower extremity muscle cramps throughout the day.  Pt states she is not opposed to taking a statin, but she would like for Dr Delton SeeNelson to advise on a different statin regimen, preferably one less potent than atorvastatin.  Pt states that she would like for Dr Delton SeeNelson to advise on a regimen she will be able to tolerate on a daily basis.  Informed the pt that I will discontinue this medication in her list, and add this to her allergies as an intolerance.  Informed the pt that Dr Delton SeeNelson is out of the office until next week, but I will route this message to her for further review and recommendation on a different statin regimen, then I will follow-up with the pt thereafter.  Pt verbalized understanding and agrees with this plan.  Pt more than gracious for all the assistance provided.

## 2016-08-16 NOTE — Telephone Encounter (Signed)
-----   Message from Lars MassonKatarina H Lebon, MD sent at 08/15/2016 10:15 PM EDT ----- Please tell her that she has diffuse moderate plaque but no severe plaque that requires revascularization. She really needs to take her atorvastatin, ideally 20 mg, I started her on 10 only, see if she is willing to take more.

## 2016-08-21 MED ORDER — ROSUVASTATIN CALCIUM 10 MG PO TABS: 10.0000 mg | ORAL_TABLET | Freq: Every day | ORAL | 1 refills | Status: DC

## 2016-08-21 NOTE — Telephone Encounter (Signed)
Lets try rosuvastatin 10 mg po daily

## 2016-08-21 NOTE — Telephone Encounter (Signed)
Spoke with the pt and informed her that per Dr Delton SeeNelson, she recommends that we d/c her atorvastatin and try starting her on rosuvastatin 10 mg po daily.  Informed the pt that I will send in only a month supply to her confirmed pharmacy of choice.  Advised the pt to inform our office if she is unable to tolerate this new statin regimen.  Advised the pt that if she does tolerate this appropriately, then she should notify our office when her supply is getting low, so further refills can be sent to her pharmacy. Pt verbalized understanding and agrees with this plan.  Pt gracious for all the assistance provided.

## 2016-08-21 NOTE — Addendum Note (Signed)
Addended by: Loa SocksMARTIN, Shuntavia Yerby M on: 08/21/2016 01:59 PM   Modules accepted: Orders

## 2016-08-24 ENCOUNTER — Telehealth: Payer: Self-pay | Admitting: Cardiology

## 2016-08-24 ENCOUNTER — Ambulatory Visit (INDEPENDENT_AMBULATORY_CARE_PROVIDER_SITE_OTHER): Payer: BLUE CROSS/BLUE SHIELD | Admitting: Licensed Clinical Social Worker

## 2016-08-24 DIAGNOSIS — F331 Major depressive disorder, recurrent, moderate: Secondary | ICD-10-CM

## 2016-08-24 MED ORDER — ROSUVASTATIN CALCIUM 10 MG PO TABS: 10.0000 mg | ORAL_TABLET | Freq: Every day | ORAL | 1 refills | Status: DC

## 2016-08-24 NOTE — Telephone Encounter (Signed)
New message    Pt verbalized that she is calling to speak to the rn because the new prescription that Dr. Delton SeeNelson prescribed did not go through. She wants the rn to have the [facility] [address] [place]  I asked pt for the name of the medication pt said she didn't know and that it was a new prescription

## 2016-08-24 NOTE — Telephone Encounter (Signed)
Spoke with the pt and informed her that her prescription for rosuvastatin has been changed to her other pharmacy of choice, [facility] on [address].  Pt verbalized understanding and agrees with this plan.
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
## 2016-08-29 ENCOUNTER — Telehealth: Payer: Self-pay | Admitting: Cardiology

## 2016-08-29 MED ORDER — ROSUVASTATIN CALCIUM 10 MG PO TABS: 10.0000 mg | ORAL_TABLET | Freq: Every day | ORAL | 1 refills | Status: DC

## 2016-08-29 NOTE — Telephone Encounter (Signed)
New message  Pt call requesting to speak with RN about her medication she is to pick up from the pharmacy. Pt does not know the name of the med. Please call back to discuss

## 2016-08-29 NOTE — Telephone Encounter (Signed)
Please try pravastatin 40 mg po daily instead.

## 2016-08-29 NOTE — Telephone Encounter (Signed)
Pt calling to inform Dr Delton SeeNelson and myself that she currently has no health insurance and the cost of rosuvastatin at [facility] will cost her $170 a month.  Pt states she will not be able to afford the cost of this medication at [facility].  Pt states that the other option is to [facility], for she is enrolled there and they've started a new pt assistance program to help with the cost of certain specified medications.  Pt states she is unsure about the assistance with rosuvastatin through the Health Dept, but she would like to at least try this out.   Pt states to go ahead and send this prescription into [facility] for a month supply, and she will call them tomorrow and ask what the cost will be from their end.  Pt states that if they aren't able to cover or assist with the coverage of this medication, then she will need Dr Delton SeeNelson to advise on a different regimen in its place.  Informed the pt that I sent this over now and advised her to follow up with our office, if further assistance needed.  Pt verbalized understanding and agrees with this plan.  Will route this message to Dr Delton SeeNelson as an Lorain ChildesFYI, for there may be a need to advise on a different regimen.

## 2016-08-30 NOTE — Telephone Encounter (Signed)
Spoke with the pt to inform her that we have a second option she could try, if the Health Dept does not cover her rosuvastatin.  Informed the pt that Dr Delton SeeNelson advised that she could try pravastatin 40 mg po daily instead, if the Health Dept refuses to cover her rosuvastatin.  Advised the pt to still proceed with giving [facility] a call today, to see if rosuvastatin will be covered through their assistance program or not.  Advised the pt to then give our office a call back to endorse what they say.  Pt verbalized understanding and agrees with this plan. Pt states she will follow-up with our office later today.  Pt gracious for all the assistance provided with this issue.

## 2016-08-31 NOTE — Telephone Encounter (Signed)
Pt called back to inform Dr Delton SeeNelson and myself that [facility] informed the pt today that they will not cover rosuvastatin, and she will have to pay at cost. Informed the pt that given rosuvastatin will not be covered, then we will proceed with Dr Lindaann SloughNelson's second plan and order for her to have pravastatin 40 mg po daily.  Advised the pt to call them tomorrow and ask if pravastatin will even be covered.  Advised the pt to call me back on Monday and inform me of this, and then I will place the order accordingly based on coverage or not.  Pt verbalized understanding and agrees with this plan.

## 2016-08-31 NOTE — Telephone Encounter (Signed)
New message   Pt verbalized that she is returning the call to the rn

## 2016-09-04 NOTE — Telephone Encounter (Signed)
[facility] faxed [facility] a medication request/suggestion for the pt to be switched to Atorvastatin/Lipitor, for they will cover this med completely through there assistance program.  Faxed [facility] to [facility], that the pt is intolerant to atorvastatin/Lipitor, but Dr Delton SeeNelson suggest to order Pravastatin 40 mg po daily, and would they cover this med through there assistance program? Fax sent to information provided.  Refills and myself to follow-up with this accordingly.

## 2016-09-05 ENCOUNTER — Telehealth: Payer: Self-pay | Admitting: Cardiology

## 2016-09-05 ENCOUNTER — Other Ambulatory Visit: Payer: Self-pay | Admitting: Licensed Clinical Social Worker

## 2016-09-05 DIAGNOSIS — E7849 Other hyperlipidemia: Secondary | ICD-10-CM

## 2016-09-05 NOTE — Telephone Encounter (Signed)
Olegario MessierKathy from [facility] called and wants to know if they can give the patient Pravastatin 40mg /d since they do not carry her original cholesterol medication and she does not tolerate Lipitor.

## 2016-09-07 MED ORDER — PRAVASTATIN SODIUM 20 MG PO TABS: 20.0000 mg | ORAL_TABLET | Freq: Every evening | ORAL | 3 refills | Status: DC

## 2016-09-07 NOTE — Telephone Encounter (Signed)
Yes, please start 20 mg po daily and follow with LFTs in 1 month, thank you

## 2016-09-07 NOTE — Telephone Encounter (Signed)
Notified the pt that the [facility] approved for her Pravastatin to be 20 mg po daily. This was ran by Dr Ayala, and she agrees to order for her to have pravastatin 20 mg po daily and have her come in for LFTs in one month.  Pt states she has an appt with Dr Rainey on 11/29, and would like her lab appt then.  Informed the pt that would be ok and we will do a lab appt to check LFTs, same day as she see's Dr Pajak on 11/29.  Pt states she will pick up her med tomorrow and start taking this.  Pt verbalized understanding and agrees with this plan. Pt very gracious for all the assistance provided. 

## 2016-09-07 NOTE — Telephone Encounter (Signed)
Not[facility] the pt [facility] Pravastatin to be 20 mg po daily. This was ran by Dr Delton SeeNelson, and she agrees to order for her to have pravastatin 20 mg po daily and have her come in for LFTs in one month.  Pt states she has an appt with Dr Delton SeeNelson on 11/29, and would like her lab appt then.  Informed the pt that would be ok and we will do a lab appt to check LFTs, same day as she see's Dr Delton SeeNelson on 11/29.  Pt states she will pick up her med tomorrow and start taking this.  Pt verbalized understanding and agrees with this plan. Pt very gracious for all the assistance provided.

## 2016-09-11 ENCOUNTER — Telehealth: Payer: Self-pay | Admitting: Cardiology

## 2016-09-11 NOTE — Telephone Encounter (Signed)
Spoke with patient who states she has been very dizzy and nauseated since starting pravastatin.  She took last dose last night.  She states the symptoms persisted even after eating this morning.  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 I advised her to hold the medication for a few days to see if her symptoms improve and call back to report. Patient verbalized understanding and agreement with plan. I am routing message to Dr. Delton SeeNelson and her primary nurse Aggie HackerIvy Martin, LPN for follow-up.

## 2016-09-11 NOTE — Telephone Encounter (Signed)
New message     Pt c/o medication issue:  1. Name of Medication: pravastatin 2. How are you currently taking this medication (dosage and times per day)? 20mg  daily 3. Are you having a reaction (difficulty breathing--STAT)? no 4. What is your medication issue? Pt c/o dizziness while on medication.  It is so bad that she feels like she is going to throw up.

## 2016-09-12 ENCOUNTER — Ambulatory Visit (INDEPENDENT_AMBULATORY_CARE_PROVIDER_SITE_OTHER): Payer: BLUE CROSS/BLUE SHIELD | Admitting: Licensed Clinical Social Worker

## 2016-09-12 DIAGNOSIS — F331 Major depressive disorder, recurrent, moderate: Secondary | ICD-10-CM

## 2016-09-12 NOTE — Telephone Encounter (Signed)
Ivy, Please call her and have her stop pravastatin, then call her again in 1 week to see if her symptoms resolved. Thank you, KN

## 2016-09-12 NOTE — Telephone Encounter (Signed)
Spoke with the pt and advised her that per Dr Delton SeeNelson, she should stop pravastatin for one week, then call us back on next Monday to report how her current symptoms are.  Pt states she stopped this yesterday, and she can already tell her symptoms are improving.  Pt reports she will call us next week, to report her progress.  Will place a hold on pts pravastatin.  Pt verbalized understanding and agrees with this plan.  Pt gracious for all the assistance provided.

## 2016-09-13 NOTE — Progress Notes (Signed)
   THERAPY PROGRESS NOTE  Session Time: 60min  Participation Level: Active  Behavioral Response: Neat and Well GroomedAlertEuthymic  Type of Therapy: Individual Therapy  Treatment Goals addressed: Coping  Interventions: Strength-based and Supportive  Summary: Haley LyonsCynthia L [name] is a 60 y.o. female who presents with a euthymic mood and appropriate affect. She reported that she had a recent scare with chest pains. She shared about the ways that this incident was a "wake up call" that she needs to get back on track with her health goals. She shared about the positive impact the chest pains also had on her stress level; she stated that she is now not stressed at all because she realizes that stress is not good for her physically or emotionally. [name] and LCSW discussed ways that she can work on her health through diet and exercise. She committed to eating more vegetables, including some that she has not tried before. She decided that a motivating exercise plan would include prepaying for classes; she committed to researching fun exercise classes in the area. [name] shared frustrations that her statin medicine does not agree with her; she stated that it was another motivator to focus on her health and wellness.  Suicidal/Homicidal: Nowithout intent/plan  Therapist Response: LCSW utilized supportive counseling techniques throughout the session in order to validate emotions and encourage open expression of emotion. LCSW checked in regarding medication compliance and health issues. LCSW provided suggestions for how to incorporate more physical activity in her life.  Plan: Return again in 2 weeks.  Diagnosis: Axis I: See current hospital problem list    Axis II: No diagnosis    Nilda Simmeratosha Cormac Wint, LCSW 09/13/2016

## 2016-09-18 ENCOUNTER — Telehealth: Payer: Self-pay | Admitting: Cardiology

## 2016-09-18 NOTE — Telephone Encounter (Signed)
Pt called back to report to Dr Delton SeeNelson that her symptoms of feeling dizzy and having N/V have completely subsided, since stopping Pravastatin.  Pt reports that she can "absolutely not tolerate statins at all." Pt is up for any new suggestions from Dr Delton SeeNelson, on how to maintain her hyperlipidemia, but she cannot take statins.  Informed the pt that I will update Dr Delton SeeNelson of this, and that she is feeling much better since stopping statins.  Informed the pt that I will update Pravastatin in her med/allergy list, as an intolerance.  Informed the pt that I will follow-up with her, with any further recommendations from Dr Delton SeeNelson.  Pt verbalized understanding and agrees with this plan.

## 2016-09-18 NOTE — Telephone Encounter (Signed)
Left a message for the pt to call back to follow-up on her current symptoms from stopping statin last week.

## 2016-09-26 ENCOUNTER — Ambulatory Visit (INDEPENDENT_AMBULATORY_CARE_PROVIDER_SITE_OTHER): Payer: BLUE CROSS/BLUE SHIELD | Admitting: Licensed Clinical Social Worker

## 2016-09-26 DIAGNOSIS — F331 Major depressive disorder, recurrent, moderate: Secondary | ICD-10-CM

## 2016-09-27 NOTE — Progress Notes (Signed)
   THERAPY PROGRESS NOTE  Session Time: 60min  Participation Level: Active  Behavioral Response: Neat and Well GroomedAlertEuthymic  Type of Therapy: Individual Therapy  Treatment Goals addressed: Coping  Interventions: Strength-based and Supportive  Summary: Haley LyonsCynthia L [name] is a 60 y.o. female who presents with a positive mood and appropriate affect. She reported that she is doing well although her family at times stresses her out. She shared about her 362 year old granddaughter moving out on her own despite not being very mature yet. She expressed worries that her granddaughter will get herself into a bad financial situation. She recognized that she has to let go of her worries and let her granddaughter make her own mistakes. Haley [name] reported that she has done well at starting to eat healthier food but that she has struggled with creating an exercise routine. She shared that she did start a Youtube workout series this week that she will try to stick with. Haley [name] shared that the EMDR session focusing on a traumatic event was very effective for her, as she can now think about the event without experiencing pain. She stated that she would like to focus on another traumatic incident when she is ready. She reported that counseling is helping her a great deal.  Suicidal/Homicidal: Nowithout intent/plan  Therapist Response: LCSW utilized supportive counseling techniques throughout the session in order to validate emotions and encourage open expression of emotion. LCSW checked in with Haley [name] regarding her diet and exercise changes. LCSW and Haley [name] processed about what has been the most helpful for her.  Plan: Return again in 2 weeks.  Diagnosis: Axis I: See current hospital problem list    Axis II: No diagnosis    Nilda Simmeratosha Kyree Fedorko, LCSW 09/27/2016

## 2016-10-04 ENCOUNTER — Ambulatory Visit (INDEPENDENT_AMBULATORY_CARE_PROVIDER_SITE_OTHER): Payer: No Typology Code available for payment source | Admitting: Cardiology

## 2016-10-04 ENCOUNTER — Other Ambulatory Visit: Payer: No Typology Code available for payment source | Admitting: *Deleted

## 2016-10-04 VITALS — BP 122/74 | HR 84 | Ht 61.0 in | Wt 236.0 lb

## 2016-10-04 DIAGNOSIS — E7849 Other hyperlipidemia: Secondary | ICD-10-CM

## 2016-10-04 DIAGNOSIS — I2510 Atherosclerotic heart disease of native coronary artery without angina pectoris: Secondary | ICD-10-CM

## 2016-10-04 DIAGNOSIS — R0600 Dyspnea, unspecified: Secondary | ICD-10-CM

## 2016-10-04 DIAGNOSIS — E782 Mixed hyperlipidemia: Secondary | ICD-10-CM

## 2016-10-04 DIAGNOSIS — R0609 Other forms of dyspnea: Secondary | ICD-10-CM

## 2016-10-04 DIAGNOSIS — I10 Essential (primary) hypertension: Secondary | ICD-10-CM

## 2016-10-04 LAB — HEPATIC FUNCTION PANEL
ALT: 29 U/L (ref 6–29)
AST: 23 U/L (ref 10–35)
Albumin: 4 g/dL (ref 3.6–5.1)
Alkaline Phosphatase: 56 U/L (ref 33–130)
Bilirubin, Direct: 0.1 mg/dL (ref <=0.2)
Indirect Bilirubin: 0.2 mg/dL (ref 0.2–1.2)
Total Bilirubin: 0.3 mg/dL (ref 0.2–1.2)
Total Protein: 6.9 g/dL (ref 6.1–8.1)

## 2016-10-04 NOTE — Patient Instructions (Signed)
Medication Instructions:   Your physician recommends that you continue on your current medications as directed. Please refer to the Current Medication list given to you today.     Follow-Up:  3 MONTHS WITH DR Delton SeeNELSON   SCHEDULE AN APPOINTMENT WITH MEGAN SUPPLE PHARMD FOR LIPID CLINIC ASAP       If you need a refill on your cardiac medications before your next appointment, please call your pharmacy.

## 2016-10-04 NOTE — Progress Notes (Signed)
Cardiology Office Note    Date:  10/04/2016   ID:  Haley LyonsCynthia L [name], DOB 09-08-1956, MRN [id number]  PCP:  Haley MansonMULBERRY,ELIZABETH, MD  Cardiologist:   Haley AlexanderKatarina Courtright, MD   Chief complaint: Chest pain, dyspnea on exertion.  History of Present Illness:   60 year old female with prior medical history of obesity, hypertension, well-controlled known insulin-dependent diabetes mellitus with latest hemoglobin A1c of 5.8 %. The patient is coming with concern of palpitations that happen almost daily and are sometimes associated with dizziness but she never experienced syncopal episodes. Her palpitations usually last seconds but on occasion up to minutes and gets when she becomes symptomatic. The patient is also experiencing worsening dyspnea on exertion, activity of daily is leaving like walking stairs would make her feel short of breath. She feels this is progressively worsening. The patient also states that she has been on blood pressure medication for years but lately her primary care physician had problems controlling diet as her blood pressure keeps increasing despite her being compliant to her meds. She states that today's measurement has been well as it has been in the file and it's usually always elevated. She is also experiencing overall fatigued and mild lower extremity edema.  The patient is coming after 1 months. She underwent an echocardiogram that showed normal biventricular function no significant valvular abnormality and mild LVH. The patient also had a stress test that was done which is a Lexiscan into no prior scar or ischemia. Her labs were all normal including TSH.  08/03/2016 - patient is coming after 2 years, she states that she has developed exertional dyspnea and chest pressure especially while she's walking stairs. She also had 3 episodes of abdominal pain that progressed to chest pain associated with shortness of breath and left her with significant fatigue when she was able to  sleep. She continues to have infrequent palpitations but no syncope. No orthopnea proximal nocturnal dyspnea or lower extremity edema. She has stopped taking atorvastatin about a year ago.  10/04/16 - the patient underwent coronary CTA that showed : 1. Coronary calcium score of 239. This was 6693 percentile for age and sex matched control. 2. Normal coronary origin with right dominance. 3. 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 We will send additional analysis for CT FFT evaluation once available.  The patient states that she feels better, she continues to have some SOB, but no significant chest pain. She is on no statins as she couldn't tolerate low dose of atorvastatin and pravastatin. She has orange card for insurance and lisinopril was switched Benicar.   Past Medical History:  Diagnosis Date  . Diabetes mellitus without complication (HCC)   . Hyperlipidemia   . Hypertension   . Shingles     Past Surgical History:  Procedure Laterality Date  . ABDOMINAL HYSTERECTOMY  1994   Laparoscopic  . CHOLECYSTECTOMY  1997   Laparoscopic  . WISDOM TOOTH EXTRACTION      Current Medications: Outpatient Medications Prior to Visit  Medication Sig Dispense Refill  . amLODipine (NORVASC) 5 MG tablet Take 1 tablet (5 mg total) by mouth daily. 30 tablet 11  . aspirin 81 MG tablet Take 81 mg by mouth daily.    . Cinnamon 500 MG TABS Take by mouth daily. Reported on 03/08/2016    . desloratadine (CLARINEX) 5 MG tablet Take 1 tablet (5 mg total) by mouth daily. 30 tablet 11  . Flax Oil-Fish Oil-Borage Oil (FISH OIL-FLAX OIL-BORAGE OIL PO) Take by mouth daily. Reported on  12/28/2015    . Flaxseed, Linseed, (FLAX SEED OIL PO) Take by mouth daily. Reported on 04/18/2016    . Garlic 100 MG TABS Take 100 mg by mouth daily.    . Ginger Root POWD by Does not apply route daily. Reported on 05/11/2016    . glimepiride (AMARYL) 4 MG tablet Take 1 tablet (4 mg total) by mouth daily with breakfast. 30 tablet  11  . LECITHIN PO Take 1 tablet by mouth daily.    . lisinopril (PRINIVIL,ZESTRIL) 40 MG tablet Take 1 tablet (40 mg total) by mouth daily. 30 tablet 11  . Magnesium 250 MG TABS Take by mouth daily.    . Misc Natural Products (DANDELION ROOT PO) Take 1 tablet by mouth daily.    . mometasone (NASONEX) 50 MCG/ACT nasal spray 2 sprays each nostril daily 17 g 11  . Multiple Vitamins-Minerals (MULTIVITAMIN WITH MINERALS) tablet Take 1 tablet by mouth daily.    . nitroGLYCERIN (NITROSTAT) 0.4 MG SL tablet 1 tab under tongue for chest pain.  May repeat every 5 minutes x 2 if chest pain not relieved 25 tablet 0  . triamcinolone cream (KENALOG) 0.1 % Apply scant amount to affected area twice daily as needed 80 g 0  . Turmeric Curcumin 500 MG CAPS Take 1 tablet by mouth daily. Reported on 05/11/2016     No facility-administered medications prior to visit.      Allergies:   Atorvastatin and Pravastatin   Social History   Social History  . Marital status: Single    Spouse name: N/A  . Number of children: 2  . Years of education: 12+   Occupational History  . Leasing Agent     Retired now   Social History Main Topics  . Smoking status: Never Smoker  . Smokeless tobacco: Never Used  . Alcohol use 0.0 oz/week     Comment: occasionally  . Drug use: No  . Sexual activity: No   Other Topics Concern  . Not on file   Social History Narrative   Did get some post high school training/education   Lives alone     Family History:  The patient's family history includes Diabetes in her brother; Hypertension in her brother, father, and son; Seizures in her brother; Stroke in her maternal aunt.   ROS:   Please see the history of present illness.    ROS All other systems reviewed and are negative.   PHYSICAL EXAM:   VS:  BP 122/74   Pulse 84   Ht 5' 1" (1.549 m)   Wt 236 lb (107 kg)   BMI 44.59 kg/m    GEN: Obese, well developed, in no acute distress  HEENT: normal  Neck: no JVD, carotid  bruits, or masses Cardiac: RRR; no murmurs, rubs, or gallops,no edema  Respiratory:  clear to auscultation bilaterally, normal work of breathing GI: soft, nontender, nondistended, + BS MS: no deformity or atrophy  Skin: warm and dry, no rash Neuro:  Alert and Oriented x 3, Strength and sensation are intact Psych: euthymic mood, full affect  Wt Readings from Last 3 Encounters:  10/04/16 236 lb (107 kg)  08/09/16 234 lb (106.1 kg)  08/03/16 231 lb (104.8 kg)    Studies/Labs Reviewed:   EKG:  Sinus rhythm, right axis deviation, nonspecific ST-T wave abnormality unchanged from prior.  Recent Labs: 07/18/2016: BUN 10; Creatinine, Ser 1.05; Platelets 267; Potassium 4.0; Sodium 141 10/04/2016: ALT 29   Lipid Panel      Component Value Date/Time   CHOL 200 (H) 07/18/2016 0837   TRIG 113 07/18/2016 0837   HDL 49 07/18/2016 0837   LDLCALC 128 (H) 07/18/2016 0837   LDLDIRECT 134 (H) 07/22/2013 1620   TTE 04/07/2014  - Left ventricle: The cavity size was normal. Wall thickness was increased in a pattern of mild LVH. The estimated ejection fraction was 55%. Wall motion was normal; there were no regional wall motion abnormalities. Doppler parameters are consistent with abnormal left ventricular relaxation (grade 1 diastolic dysfunction). - Aortic valve: There was no stenosis. - Mitral valve: There was no significant regurgitation. - Right ventricle: The cavity size was normal. Systolic function was normal. - Pulmonary arteries: No complete TR doppler jet so unable to estimate PA systolic pressure. - Inferior vena cava: The vessel was normal in size. The respirophasic diameter changes were in the normal range (>= 50%), consistent with normal central venous pressure.  Impressions:  - Normal LV size with mild LV hypertrophy, EF 55%. Normal RV size and systolic function. No significant valvular abnormalities.   CT coronary: 08/14/16  IMPRESSION: 1. Coronary  calcium score of 239. This was 6893 percentile for age and sex matched control.  2. Normal coronary origin with right dominance.  3. 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 We will send additional analysis for CT FFT evaluation once available.  4. Mildly dilated pulmonary artery measuring 31 x 27 mm suspicious for pulmonary hypertension.  Haley AlexanderKatarina Nolde     ASSESSMENT:    1. Coronary artery disease involving native coronary artery of native heart without angina pectoris   2. Dyspnea on exertion   3. Essential hypertension   4. Mixed hyperlipidemia      PLAN:   1.CAD - with coronary calcium score of 239. This was 3693 percentile for age and sex matched control and moderate diffuse CAD, possibly obstructive. We will send additional analysis for CT FFT evaluation. We will continue ASA, Benicar, and refer to the lipid for possible initiation of PCSK 9 inhibitors.  2. Hypertensive heart disease we doubt CHF - well controlled.  3. Hyperlipidemia, with LDL of 128 in a diabetic person,plan as above.\  4. Palpitations that are associated with dizziness -  48-hour Holter monitor showed  only few PVCs.  Followup in 3 months.   Medication Adjustments/Labs and Tests Ordered: Current medicines are reviewed at length with the patient today.  Concerns regarding medicines are outlined above.  Medication changes, Labs and Tests ordered today are listed in the Patient Instructions below. Patient Instructions  Medication Instructions:   Your physician recommends that you continue on your current medications as directed. Please refer to the Current Medication list given to you today.     Follow-Up:  3 MONTHS WITH DR Delton SeeNELSON   SCHEDULE AN APPOINTMENT WITH MEGAN SUPPLE PHARMD FOR LIPID CLINIC ASAP       If you need a refill on your cardiac medications before your next appointment, please call your pharmacy.      Signed, Haley AlexanderKatarina Rowand, MD  10/04/2016 11:11 PM      [facility] [address], [place], [place]  [address] Phone: [phone](336) [phone]; Fax: [phone]) [phone]

## 2016-10-06 ENCOUNTER — Ambulatory Visit (INDEPENDENT_AMBULATORY_CARE_PROVIDER_SITE_OTHER): Payer: No Typology Code available for payment source | Admitting: Pharmacist

## 2016-10-06 VITALS — BP 148/90 | HR 75 | Ht 61.0 in | Wt 236.0 lb

## 2016-10-06 DIAGNOSIS — E785 Hyperlipidemia, unspecified: Secondary | ICD-10-CM

## 2016-10-06 NOTE — Patient Instructions (Addendum)
[employer] patient assistance should contact you to start Zetia 10mg  once daily.  Try to start walking 15-20 minutes most days a week or use your exercise DVDs.  Call Megan in lipid clinic once you receive your first shipment of Zetia so that we can set up lab work to recheck your cholesterol.

## 2016-10-06 NOTE — Progress Notes (Signed)
Patient ID: Haley LyonsCynthia L [name]                 DOB: 1956-04-26                    MRN: [id number]     HPI: Haley [name] is a 60 y.o. female patient referred to lipid clinic by Dr Delton SeeNelson. PMH is significant for HTN, DM, obesity, moderate diffuse CAD, and elevated coronary calcium score of 239 matched 93% for age and sex.   Pt has previously tried 3 statins, each at their lowest doses. She experienced severe leg cramps with low dose Crestor 10mg  daily. They began within 3 days of therapy initiation and resolved within a few days of medication discontinuation. She also tried low dose Lipitor 10mg  daily but experienced dizziness and a spinning sensation in her head. Pravastatin 20mg  daily gave her an upset stomach. She does have some aches at baseline but they were much more pronounced on statin therapy.  Of note, pt went to social services and was told she would not qualify for Medicare. She has the orange card that [facility]. They have a very limited formulary for covered medications.  Current Medications: fish oil/flax seed oil 2 tabs daily Intolerances: atorvastatin 10mg  daily - dizziness/spinning, pravastatin 20mg  - "sick on the stomach," rosuvastatin 10mg  daily - leg cramps and myalgias Risk Factors: moderate diffuse CAD, elevated coronary calcium score 93% matched for age and sex LDL goal: 70mg /dL  Diet: Breakfast - eggs, salmon, Malaw[place]urkey sausage, grits or oatmeal. Lunch/dinner - vegetable and baked meat. Does not fry any food.  Exercise: Has not been doing much lately but would like to start walking more and using exercise DVDs.  Family History: DM in her brother, HTN in her brother, father, and son, stroke in her maternal aunt.  Social History: Denies tobacco use and illicit drug use. Occasionally drinks alcohol.  Labs: 07/18/16: TC 200, TG 113, HDL 49, LDL 128 (no therapy)  Past Medical History:  Diagnosis Date  . Diabetes mellitus without  complication (HCC)   . Hyperlipidemia   . Hypertension   . Shingles     Current Outpatient Prescriptions on File Prior to Visit  Medication Sig Dispense Refill  . amLODipine (NORVASC) 5 MG tablet Take 1 tablet (5 mg total) by mouth daily. 30 tablet 11  . aspirin 81 MG tablet Take 81 mg by mouth daily.    . Cinnamon 500 MG TABS Take by mouth daily. Reported on 03/08/2016    . desloratadine (CLARINEX) 5 MG tablet Take 1 tablet (5 mg total) by mouth daily. 30 tablet 11  . Flax Oil-Fish Oil-Borage Oil (FISH OIL-FLAX OIL-BORAGE OIL PO) Take by mouth daily. Reported on 12/28/2015    . Flaxseed, Linseed, (FLAX SEED OIL PO) Take by mouth daily. Reported on 04/18/2016    . Garlic 100 MG TABS Take 100 mg by mouth daily.    . Ginger Root POWD by Does not apply route daily. Reported on 05/11/2016    . glimepiride (AMARYL) 4 MG tablet Take 1 tablet (4 mg total) by mouth daily with breakfast. 30 tablet 11  . LECITHIN PO Take 1 tablet by mouth daily.    Marland Kitchen. lisinopril (PRINIVIL,ZESTRIL) 40 MG tablet Take 1 tablet (40 mg total) by mouth daily. 30 tablet 11  . Magnesium 250 MG TABS Take by mouth daily.    . Misc Natural Products (DANDELION ROOT PO) Take 1 tablet by  mouth daily.    . mometasone (NASONEX) 50 MCG/ACT nasal spray 2 sprays each nostril daily 17 g 11  . Multiple Vitamins-Minerals (MULTIVITAMIN WITH MINERALS) tablet Take 1 tablet by mouth daily.    . nitroGLYCERIN (NITROSTAT) 0.4 MG SL tablet 1 tab under tongue for chest pain.  May repeat every 5 minutes x 2 if chest pain not relieved 25 tablet 0  . olmesartan (BENICAR) 40 MG tablet Take 40 mg by mouth daily.    Marland Kitchen. triamcinolone cream (KENALOG) 0.1 % Apply scant amount to affected area twice daily as needed 80 g 0  . Turmeric Curcumin 500 MG CAPS Take 1 tablet by mouth daily. Reported on 05/11/2016     No current facility-administered medications on file prior to visit.     Allergies  Allergen Reactions  . Atorvastatin Other (See Comments)    Pt  states causes bilateral lower extremity muscle cramps  . Pravastatin Nausea And Vomiting and Other (See Comments)    Causes dizziness    Assessment/Plan:  1. Hyperlipidemia - LDL 128mg /dL above goal 70mg /dL given diffuse CAD and elevated coronary calcium score. Pt is intolerant to low dose rosuvastatin, atorvastatin, and pravastatin. Pt [facility] the [facility] which [facility] a [facility]. Will start Zetia 10mg  daily. Pt filled out patient assistance paperwork for Zetia coverage. She will call clinic once she receives her first shipment so we can schedule follow up lab work.  Pt signed informed consent for [facility] lipid registry.  Megan E. Supple, PharmD, CPP [facility] [address], [place], [place] [address] Phone: [phone]) [phone]; Fax: [phone] 10/06/2016 9:43 AM

## 2016-10-10 ENCOUNTER — Other Ambulatory Visit: Payer: Self-pay | Admitting: Licensed Clinical Social Worker

## 2016-10-12 ENCOUNTER — Ambulatory Visit (INDEPENDENT_AMBULATORY_CARE_PROVIDER_SITE_OTHER): Payer: BLUE CROSS/BLUE SHIELD | Admitting: Licensed Clinical Social Worker

## 2016-10-12 DIAGNOSIS — F331 Major depressive disorder, recurrent, moderate: Secondary | ICD-10-CM

## 2016-10-13 NOTE — Progress Notes (Signed)
   THERAPY PROGRESS NOTE  Session Time: 60min  Participation Level: Active  Behavioral Response: CasualAlertDepressed  Type of Therapy: Individual Therapy  Treatment Goals addressed: Coping  Interventions: Solution Focused and Supportive  Summary: Haley LyonsCynthia L [name] is a 60 y.o. female who presents with a depressed mood and appropriate affect. She reported that she has been struggling over the past week, as she always struggles during December. She shared that her mother's favorite time of year was December, as her mother was excited about Christmas. Haley [name] expressed that every December, she has increased memories of her mother which results in more sadness. She stated that she has been going to bed very early, around 6:30, just to avoid her sadness. She expressed that she would prefer to be a hermit all winter than have to interact with others. Haley [name] shared that this winter feels especially hard because she does not have her dog, who she had to give up when she moved into her current apartment. She expressed loneliness without her dog. Haley [name] was unable to identify solutions from the past that helped her to get through the month of December. She agreed that she will try to stay physically active and get sunshine often.  Suicidal/Homicidal: Nowithout intent/plan  Therapist Response: [name] utilized supportive counseling techniques throughout the session in order to validate emotions and encourage open expression of emotion. [name] noted that Haley [name] was much more tearful than in typical sessions. [name] utilized Solution Focused Therapy techniques in order to identify solutions and strengths that have helped in the past. [name] encouraged Haley [name] to stay physically active throughout the winter in order to cope with depression.  Plan: Return again in 3 weeks.  Diagnosis: Axis I: See current hospital problem list    Axis II: No diagnosis    Haley Simmeratosha Aminta Sakurai, [name] 10/13/2016

## 2016-10-16 ENCOUNTER — Encounter: Payer: Self-pay | Admitting: *Deleted

## 2016-10-16 ENCOUNTER — Telehealth: Payer: Self-pay | Admitting: *Deleted

## 2016-10-16 DIAGNOSIS — Z01812 Encounter for preprocedural laboratory examination: Secondary | ICD-10-CM

## 2016-10-16 DIAGNOSIS — R931 Abnormal findings on diagnostic imaging of heart and coronary circulation: Secondary | ICD-10-CM

## 2016-10-16 NOTE — Addendum Note (Signed)
Addended by: Lars MassonNELSON, Havard Radigan H on: 10/16/2016 05:48 PM   Modules accepted: Orders, SmartSet

## 2016-10-16 NOTE — Telephone Encounter (Signed)
-----   Message from Lars MassonKatarina H Zahner, MD sent at 10/13/2016  2:19 PM EST ----- Hi Ivy, I have obtained additional results from CT FFR and she has ischemia in the RCA and diagonal branch.Please schedule her for a left cardiac cath. Thank you, Aris LotKatarina

## 2016-10-16 NOTE — Telephone Encounter (Signed)
Spoke with the pt to inform her that additional results from her CT back in October have been resulted and CT FFR result, per Dr Delton SeeNelson, showed that she has ischemia in the RCA and diagonal branch.  Informed the pt that per Dr Delton SeeNelson, she needs to be scheduled for a left cardiac cath, and pre-procedure labs prior too, for these findings.  Pt verbalized understanding and agrees with this plan.  Pt request for cath to be scheduled for next Monday or Tuesday, early morning.   Scheduled the pts cath for next Monday 10/23/16 at 0730, for Dr Clifton JamesMcAlhany to do.   Pt to arrive at short stay at 0530.   Pts pre-cath labs are scheduled for this Wednesday 10/18/16, to check bmet, cbc w diff, and pt/inr.   Pt is aware that her cardiac cath letter will be at the front desk for her to pick-up at her lab appt.  Informed the pt that if she has any questions at all with her cath instructions, she should ask for myself or a triage Nurse.  Briefly went over cath instructions with the pt over the phone.  Informed the pt that [facility] at [employer].  Informed the pt that she must hold her glimepiride 24 hours before cath and 48 hours after cath.  Informed the pt that she must take her ASA everyday.   Pt verbalized understanding and agrees with this plan.

## 2016-10-17 ENCOUNTER — Telehealth: Payer: Self-pay | Admitting: Cardiology

## 2016-10-17 NOTE — Telephone Encounter (Signed)
Follow Up:; ° ° °Returning your call. °

## 2016-10-17 NOTE — Telephone Encounter (Signed)
Pt is just calling to follow-up to confirm that her pre-cath labs are scheduled for tomorrow 12/13 and cath is on Monday 12/18.  Informed the pt that yes, her pre-cath labs are scheduled for tomorrow 12/13 and she may come anytime between 0730 and 5pm. Informed the pt that when she checks in for lab, she should request for the greeter to give her cath instruction letter to her, left at the front desk.  Also informed the pt that yes, her cath is scheduled for next Monday 12/18 for Dr Clifton JamesMcAlhany to do.  Informed the pt that her cath instruction letter will have all this information provided in it as well.  Pt verbalized understanding and agrees with this plan.

## 2016-10-18 ENCOUNTER — Other Ambulatory Visit: Payer: No Typology Code available for payment source | Admitting: *Deleted

## 2016-10-18 DIAGNOSIS — Z01812 Encounter for preprocedural laboratory examination: Secondary | ICD-10-CM

## 2016-10-18 DIAGNOSIS — R931 Abnormal findings on diagnostic imaging of heart and coronary circulation: Secondary | ICD-10-CM

## 2016-10-19 LAB — CBC WITH DIFFERENTIAL/PLATELET
Basophils Absolute: 61 cells/uL (ref 0–200)
Basophils Relative: 1 %
Eosinophils Absolute: 183 cells/uL (ref 15–500)
Eosinophils Relative: 3 %
HCT: 41.8 % (ref 35.0–45.0)
Hemoglobin: 14.1 g/dL (ref 11.7–15.5)
Lymphocytes Relative: 41 %
Lymphs Abs: 2501 cells/uL (ref 850–3900)
MCH: 32.1 pg (ref 27.0–33.0)
MCHC: 33.7 g/dL (ref 32.0–36.0)
MCV: 95.2 fL (ref 80.0–100.0)
MPV: 10 fL (ref 7.5–12.5)
Monocytes Absolute: 549 cells/uL (ref 200–950)
Monocytes Relative: 9 %
Neutro Abs: 2806 cells/uL (ref 1500–7800)
Neutrophils Relative %: 46 %
Platelets: 299 10*3/uL (ref 140–400)
RBC: 4.39 MIL/uL (ref 3.80–5.10)
RDW: 13.2 % (ref 11.0–15.0)
WBC: 6.1 10*3/uL (ref 3.8–10.8)

## 2016-10-19 LAB — BASIC METABOLIC PANEL
BUN: 13 mg/dL (ref 7–25)
CO2: 23 mmol/L (ref 20–31)
Calcium: 9.3 mg/dL (ref 8.6–10.4)
Chloride: 107 mmol/L (ref 98–110)
Creat: 1.17 mg/dL — ABNORMAL HIGH (ref 0.50–0.99)
Glucose, Bld: 104 mg/dL — ABNORMAL HIGH (ref 65–99)
Potassium: 4.3 mmol/L (ref 3.5–5.3)
Sodium: 139 mmol/L (ref 135–146)

## 2016-10-19 LAB — PROTIME-INR
INR: 1
Prothrombin Time: 10.6 s (ref 9.0–11.5)

## 2016-10-20 ENCOUNTER — Encounter: Payer: Self-pay | Admitting: *Deleted

## 2016-10-20 DIAGNOSIS — Z006 Encounter for examination for normal comparison and control in clinical research program: Secondary | ICD-10-CM

## 2016-10-20 NOTE — Progress Notes (Signed)
Late entry: Subject met inclusion and exclusion criteria. The informed consent form, study requirements and expectations were reviewed with the subject and questions and concerns were addressed prior to the signing of the consent form. The subject verbalized understanding of the trail requirements. The subject agreed to participate in the [place] Registryand signed the informed consent. The informed consent was obtained prior to performance of any protocol-specific procedures for the subject. A copy of the signed informed consent was given to the subject and a copy was placed in the subject's medical record.  Jake Bathe, RN 10/06/2016

## 2016-10-23 ENCOUNTER — Ambulatory Visit (HOSPITAL_COMMUNITY)
Admission: RE | Admit: 2016-10-23 | Discharge: 2016-10-23 | Disposition: A | Discharge status: Home / Self Care | Payer: BLUE CROSS/BLUE SHIELD | Source: Ambulatory Visit | Attending: Cardiovascular Disease | Admitting: Cardiovascular Disease

## 2016-10-23 ENCOUNTER — Encounter (HOSPITAL_COMMUNITY): Payer: Self-pay | Admitting: Cardiovascular Disease

## 2016-10-23 ENCOUNTER — Encounter (HOSPITAL_COMMUNITY)
Admission: RE | Disposition: A | Discharge status: Home / Self Care | Payer: Self-pay | Source: Ambulatory Visit | Attending: Cardiovascular Disease

## 2016-10-23 DIAGNOSIS — I10 Essential (primary) hypertension: Secondary | ICD-10-CM | POA: Diagnosis not present

## 2016-10-23 DIAGNOSIS — Z794 Long term (current) use of insulin: Secondary | ICD-10-CM | POA: Insufficient documentation

## 2016-10-23 DIAGNOSIS — Z823 Family history of stroke: Secondary | ICD-10-CM | POA: Diagnosis not present

## 2016-10-23 DIAGNOSIS — Z833 Family history of diabetes mellitus: Secondary | ICD-10-CM | POA: Diagnosis not present

## 2016-10-23 DIAGNOSIS — E669 Obesity, unspecified: Secondary | ICD-10-CM | POA: Insufficient documentation

## 2016-10-23 DIAGNOSIS — E119 Type 2 diabetes mellitus without complications: Secondary | ICD-10-CM | POA: Diagnosis not present

## 2016-10-23 DIAGNOSIS — Z8249 Family history of ischemic heart disease and other diseases of the circulatory system: Secondary | ICD-10-CM | POA: Diagnosis not present

## 2016-10-23 DIAGNOSIS — E782 Mixed hyperlipidemia: Secondary | ICD-10-CM | POA: Diagnosis not present

## 2016-10-23 DIAGNOSIS — Z6841 Body Mass Index (BMI) 40.0 and over, adult: Secondary | ICD-10-CM | POA: Diagnosis not present

## 2016-10-23 DIAGNOSIS — Z7982 Long term (current) use of aspirin: Secondary | ICD-10-CM | POA: Insufficient documentation

## 2016-10-23 DIAGNOSIS — I2510 Atherosclerotic heart disease of native coronary artery without angina pectoris: Secondary | ICD-10-CM | POA: Insufficient documentation

## 2016-10-23 DIAGNOSIS — R0609 Other forms of dyspnea: Secondary | ICD-10-CM | POA: Insufficient documentation

## 2016-10-23 HISTORY — PX: CARDIAC CATHETERIZATION: SHX172

## 2016-10-23 LAB — GLUCOSE, CAPILLARY: GLUCOSE-CAPILLARY: 116 mg/dL — AB (ref 65–99)

## 2016-10-23 LAB — NO BLOOD PRODUCTS

## 2016-10-23 SURGERY — LEFT HEART CATH AND CORONARY ANGIOGRAPHY

## 2016-10-23 MED ORDER — SODIUM CHLORIDE 0.9% FLUSH: 3.0000 mL | INTRAVENOUS | Status: DC | PRN

## 2016-10-23 MED ORDER — SODIUM CHLORIDE 0.9 % IV SOLN: 250.0000 mL | INTRAVENOUS | Status: DC | PRN

## 2016-10-23 MED ORDER — VERAPAMIL HCL 2.5 MG/ML IV SOLN
INTRAVENOUS | Status: AC
  Filled 2016-10-23: qty 2

## 2016-10-23 MED ORDER — SODIUM CHLORIDE 0.9 % IV SOLN: INTRAVENOUS | Status: AC

## 2016-10-23 MED ORDER — ASPIRIN 81 MG PO CHEW: 81.0000 mg | CHEWABLE_TABLET | ORAL | Status: DC

## 2016-10-23 MED ORDER — IOPAMIDOL (ISOVUE-370) INJECTION 76%
INTRAVENOUS | Status: AC
  Filled 2016-10-23: qty 100

## 2016-10-23 MED ORDER — FENTANYL CITRATE (PF) 100 MCG/2ML IJ SOLN
INTRAMUSCULAR | Status: DC | PRN
  Administered 2016-10-23: 50 ug via INTRAVENOUS

## 2016-10-23 MED ORDER — IOPAMIDOL (ISOVUE-370) INJECTION 76%
INTRAVENOUS | Status: DC | PRN
  Administered 2016-10-23: 95 mL via INTRA_ARTERIAL

## 2016-10-23 MED ORDER — LIDOCAINE HCL (PF) 1 % IJ SOLN
INTRAMUSCULAR | Status: AC
  Filled 2016-10-23: qty 30

## 2016-10-23 MED ORDER — FENTANYL CITRATE (PF) 100 MCG/2ML IJ SOLN
INTRAMUSCULAR | Status: AC
  Filled 2016-10-23: qty 2

## 2016-10-23 MED ORDER — HEPARIN (PORCINE) IN NACL 2-0.9 UNIT/ML-% IJ SOLN
INTRAMUSCULAR | Status: DC | PRN
  Administered 2016-10-23: 1000 mL via INTRA_ARTERIAL

## 2016-10-23 MED ORDER — SODIUM CHLORIDE 0.9 % WEIGHT BASED INFUSION: 1.0000 mL/kg/h | INTRAVENOUS | Status: DC

## 2016-10-23 MED ORDER — HEPARIN (PORCINE) IN NACL 2-0.9 UNIT/ML-% IJ SOLN
INTRAMUSCULAR | Status: AC
  Filled 2016-10-23: qty 1000

## 2016-10-23 MED ORDER — SODIUM CHLORIDE 0.9 % WEIGHT BASED INFUSION
3.0000 mL/kg/h | INTRAVENOUS | Status: DC
  Administered 2016-10-23: 3 mL/kg/h via INTRAVENOUS

## 2016-10-23 MED ORDER — HEPARIN SODIUM (PORCINE) 1000 UNIT/ML IJ SOLN
INTRAMUSCULAR | Status: DC | PRN
  Administered 2016-10-23: 5500 [IU] via INTRAVENOUS

## 2016-10-23 MED ORDER — HEPARIN SODIUM (PORCINE) 1000 UNIT/ML IJ SOLN
INTRAMUSCULAR | Status: AC
  Filled 2016-10-23: qty 1

## 2016-10-23 MED ORDER — MIDAZOLAM HCL 2 MG/2ML IJ SOLN
INTRAMUSCULAR | Status: DC | PRN
  Administered 2016-10-23: 2 mg via INTRAVENOUS

## 2016-10-23 MED ORDER — VERAPAMIL HCL 2.5 MG/ML IV SOLN
INTRAVENOUS | Status: DC | PRN
  Administered 2016-10-23: 10 mL via INTRA_ARTERIAL

## 2016-10-23 MED ORDER — LIDOCAINE HCL (PF) 1 % IJ SOLN
INTRAMUSCULAR | Status: DC | PRN
  Administered 2016-10-23: 3 mL via INTRADERMAL

## 2016-10-23 MED ORDER — MIDAZOLAM HCL 2 MG/2ML IJ SOLN
INTRAMUSCULAR | Status: AC
  Filled 2016-10-23: qty 2

## 2016-10-23 MED ORDER — SODIUM CHLORIDE 0.9% FLUSH: 3.0000 mL | Freq: Two times a day (BID) | INTRAVENOUS | Status: DC

## 2016-10-23 SURGICAL SUPPLY — 10 items
CATH 5FR JL3.5 JR4 ANG PIG MP (CATHETERS) ×1 IMPLANT
DEVICE RAD COMP TR BAND LRG (VASCULAR PRODUCTS) ×1 IMPLANT
GLIDESHEATH SLEND SS 6F .021 (SHEATH) ×1 IMPLANT
GUIDEWIRE INQWIRE 1.5J.035X260 (WIRE) IMPLANT
INQWIRE 1.5J .035X260CM (WIRE) ×2
KIT HEART LEFT (KITS) ×2 IMPLANT
PACK CARDIAC CATHETERIZATION (CUSTOM PROCEDURE TRAY) ×2 IMPLANT
SYR MEDRAD MARK V 150ML (SYRINGE) ×2 IMPLANT
TRANSDUCER W/STOPCOCK (MISCELLANEOUS) ×2 IMPLANT
TUBING CIL FLEX 10 FLL-RA (TUBING) ×2 IMPLANT

## 2016-10-23 NOTE — H&P (View-Only) (Signed)
Cardiology Office Note    Date:  10/04/2016   ID:  Haley LyonsCynthia L Schnackenberg, DOB 09-08-1956, MRN [id number]  PCP:  Julieanne MansonMULBERRY,ELIZABETH, MD  Cardiologist:   Tobias AlexanderKatarina Courtright, MD   Chief complaint: Chest pain, dyspnea on exertion.  History of Present Illness:   60 year old female with prior medical history of obesity, hypertension, well-controlled known insulin-dependent diabetes mellitus with latest hemoglobin A1c of 5.8 %. The patient is coming with concern of palpitations that happen almost daily and are sometimes associated with dizziness but she never experienced syncopal episodes. Her palpitations usually last seconds but on occasion up to minutes and gets when she becomes symptomatic. The patient is also experiencing worsening dyspnea on exertion, activity of daily is leaving like walking stairs would make her feel short of breath. She feels this is progressively worsening. The patient also states that she has been on blood pressure medication for years but lately her primary care physician had problems controlling diet as her blood pressure keeps increasing despite her being compliant to her meds. She states that today's measurement has been well as it has been in the file and it's usually always elevated. She is also experiencing overall fatigued and mild lower extremity edema.  The patient is coming after 1 months. She underwent an echocardiogram that showed normal biventricular function no significant valvular abnormality and mild LVH. The patient also had a stress test that was done which is a Lexiscan into no prior scar or ischemia. Her labs were all normal including TSH.  08/03/2016 - patient is coming after 2 years, she states that she has developed exertional dyspnea and chest pressure especially while she's walking stairs. She also had 3 episodes of abdominal pain that progressed to chest pain associated with shortness of breath and left her with significant fatigue when she was able to  sleep. She continues to have infrequent palpitations but no syncope. No orthopnea proximal nocturnal dyspnea or lower extremity edema. She has stopped taking atorvastatin about a year ago.  10/04/16 - the patient underwent coronary CTA that showed : 1. Coronary calcium score of 239. This was 6693 percentile for age and sex matched control. 2. Normal coronary origin with right dominance. 3. 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 We will send additional analysis for CT FFT evaluation once available.  The patient states that she feels better, she continues to have some SOB, but no significant chest pain. She is on no statins as she couldn't tolerate low dose of atorvastatin and pravastatin. She has orange card for insurance and lisinopril was switched Benicar.   Past Medical History:  Diagnosis Date  . Diabetes mellitus without complication (HCC)   . Hyperlipidemia   . Hypertension   . Shingles     Past Surgical History:  Procedure Laterality Date  . ABDOMINAL HYSTERECTOMY  1994   Laparoscopic  . CHOLECYSTECTOMY  1997   Laparoscopic  . WISDOM TOOTH EXTRACTION      Current Medications: Outpatient Medications Prior to Visit  Medication Sig Dispense Refill  . amLODipine (NORVASC) 5 MG tablet Take 1 tablet (5 mg total) by mouth daily. 30 tablet 11  . aspirin 81 MG tablet Take 81 mg by mouth daily.    . Cinnamon 500 MG TABS Take by mouth daily. Reported on 03/08/2016    . desloratadine (CLARINEX) 5 MG tablet Take 1 tablet (5 mg total) by mouth daily. 30 tablet 11  . Flax Oil-Fish Oil-Borage Oil (FISH OIL-FLAX OIL-BORAGE OIL PO) Take by mouth daily. Reported on  12/28/2015    . Flaxseed, Linseed, (FLAX SEED OIL PO) Take by mouth daily. Reported on 04/18/2016    . Garlic 100 MG TABS Take 100 mg by mouth daily.    . Ginger Root POWD by Does not apply route daily. Reported on 05/11/2016    . glimepiride (AMARYL) 4 MG tablet Take 1 tablet (4 mg total) by mouth daily with breakfast. 30 tablet  11  . LECITHIN PO Take 1 tablet by mouth daily.    Marland Kitchen. lisinopril (PRINIVIL,ZESTRIL) 40 MG tablet Take 1 tablet (40 mg total) by mouth daily. 30 tablet 11  . Magnesium 250 MG TABS Take by mouth daily.    . Misc Natural Products (DANDELION ROOT PO) Take 1 tablet by mouth daily.    . mometasone (NASONEX) 50 MCG/ACT nasal spray 2 sprays each nostril daily 17 g 11  . Multiple Vitamins-Minerals (MULTIVITAMIN WITH MINERALS) tablet Take 1 tablet by mouth daily.    . nitroGLYCERIN (NITROSTAT) 0.4 MG SL tablet 1 tab under tongue for chest pain.  May repeat every 5 minutes x 2 if chest pain not relieved 25 tablet 0  . triamcinolone cream (KENALOG) 0.1 % Apply scant amount to affected area twice daily as needed 80 g 0  . Turmeric Curcumin 500 MG CAPS Take 1 tablet by mouth daily. Reported on 05/11/2016     No facility-administered medications prior to visit.      Allergies:   Atorvastatin and Pravastatin   Social History   Social History  . Marital status: Single    Spouse name: N/A  . Number of children: 2  . Years of education: 12+   Occupational History  . Print production plannerLeasing Agent     Retired now   Social History Main Topics  . Smoking status: Never Smoker  . Smokeless tobacco: Never Used  . Alcohol use 0.0 oz/week     Comment: occasionally  . Drug use: No  . Sexual activity: No   Other Topics Concern  . Not on file   Social History Narrative   Did get some post high school training/education   Lives alone     Family History:  The patient's family history includes Diabetes in her brother; Hypertension in her brother, father, and son; Seizures in her brother; Stroke in her maternal aunt.   ROS:   Please see the history of present illness.    ROS All other systems reviewed and are negative.   PHYSICAL EXAM:   VS:  BP 122/74   Pulse 84   Ht 5\' 1"  (1.549 m)   Wt 236 lb (107 kg)   BMI 44.59 kg/m    GEN: Obese, well developed, in no acute distress  HEENT: normal  Neck: no JVD, carotid  bruits, or masses Cardiac: RRR; no murmurs, rubs, or gallops,no edema  Respiratory:  clear to auscultation bilaterally, normal work of breathing GI: soft, nontender, nondistended, + BS MS: no deformity or atrophy  Skin: warm and dry, no rash Neuro:  Alert and Oriented x 3, Strength and sensation are intact Psych: euthymic mood, full affect  Wt Readings from Last 3 Encounters:  10/04/16 236 lb (107 kg)  08/09/16 234 lb (106.1 kg)  08/03/16 231 lb (104.8 kg)    Studies/Labs Reviewed:   EKG:  Sinus rhythm, right axis deviation, nonspecific ST-T wave abnormality unchanged from prior.  Recent Labs: 07/18/2016: BUN 10; Creatinine, Ser 1.05; Platelets 267; Potassium 4.0; Sodium 141 10/04/2016: ALT 29   Lipid Panel  Component Value Date/Time   CHOL 200 (H) 07/18/2016 0837   TRIG 113 07/18/2016 0837   HDL 49 07/18/2016 0837   LDLCALC 128 (H) 07/18/2016 0837   LDLDIRECT 134 (H) 07/22/2013 1620   TTE 04/07/2014  - Left ventricle: The cavity size was normal. Wall thickness was increased in a pattern of mild LVH. The estimated ejection fraction was 55%. Wall motion was normal; there were no regional wall motion abnormalities. Doppler parameters are consistent with abnormal left ventricular relaxation (grade 1 diastolic dysfunction). - Aortic valve: There was no stenosis. - Mitral valve: There was no significant regurgitation. - Right ventricle: The cavity size was normal. Systolic function was normal. - Pulmonary arteries: No complete TR doppler jet so unable to estimate PA systolic pressure. - Inferior vena cava: The vessel was normal in size. The respirophasic diameter changes were in the normal range (>= 50%), consistent with normal central venous pressure.  Impressions:  - Normal LV size with mild LV hypertrophy, EF 55%. Normal RV size and systolic function. No significant valvular abnormalities.   CT coronary: 08/14/16  IMPRESSION: 1. Coronary  calcium score of 239. This was 6893 percentile for age and sex matched control.  2. Normal coronary origin with right dominance.  3. 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 We will send additional analysis for CT FFT evaluation once available.  4. Mildly dilated pulmonary artery measuring 31 x 27 mm suspicious for pulmonary hypertension.  Tobias AlexanderKatarina Nolde     ASSESSMENT:    1. Coronary artery disease involving native coronary artery of native heart without angina pectoris   2. Dyspnea on exertion   3. Essential hypertension   4. Mixed hyperlipidemia      PLAN:   1.CAD - with coronary calcium score of 239. This was 3693 percentile for age and sex matched control and moderate diffuse CAD, possibly obstructive. We will send additional analysis for CT FFT evaluation. We will continue ASA, Benicar, and refer to the lipid for possible initiation of PCSK 9 inhibitors.  2. Hypertensive heart disease we doubt CHF - well controlled.  3. Hyperlipidemia, with LDL of 128 in a diabetic person,plan as above.\  4. Palpitations that are associated with dizziness -  48-hour Holter monitor showed  only few PVCs.  Followup in 3 months.   Medication Adjustments/Labs and Tests Ordered: Current medicines are reviewed at length with the patient today.  Concerns regarding medicines are outlined above.  Medication changes, Labs and Tests ordered today are listed in the Patient Instructions below. Patient Instructions  Medication Instructions:   Your physician recommends that you continue on your current medications as directed. Please refer to the Current Medication list given to you today.     Follow-Up:  3 MONTHS WITH DR Delton SeeNELSON   SCHEDULE AN APPOINTMENT WITH MEGAN SUPPLE PHARMD FOR LIPID CLINIC ASAP       If you need a refill on your cardiac medications before your next appointment, please call your pharmacy.      Signed, Tobias AlexanderKatarina Rowand, MD  10/04/2016 11:11 PM      [facility] [address], [place], [place]  [address] Phone: [phone]; Fax: [phone]

## 2016-10-23 NOTE — Discharge Instructions (Signed)

## 2016-10-23 NOTE — Interval H&P Note (Signed)
History and Physical Interval Note:  10/23/2016 7:18 AM  Haley [name]  has presented today for cardiac cath with the diagnosis of CAD/unstable angina. The various methods of treatment have been discussed with the patient and family. After consideration of risks, benefits and other options for treatment, the patient has consented to  Procedure(s): Left Heart Cath and Coronary Angiography (N/A) as a surgical intervention .  The patient's history has been reviewed, patient examined, no change in status, stable for surgery.  I have reviewed the patient's chart and labs.  Questions were answered to the patient's satisfaction.  Cath Lab Visit (complete for each Cath Lab visit)  Clinical Evaluation Leading to the Procedure:   ACS: No.  Non-ACS:    Anginal Classification: CCS III  Anti-ischemic medical therapy: Minimal Therapy (1 class of medications)  Non-Invasive Test Results: No non-invasive testing performed  Prior CABG: No previous CABG           Verne Haley [name]

## 2016-11-02 ENCOUNTER — Ambulatory Visit (INDEPENDENT_AMBULATORY_CARE_PROVIDER_SITE_OTHER): Payer: BLUE CROSS/BLUE SHIELD | Admitting: Licensed Clinical Social Worker

## 2016-11-02 DIAGNOSIS — F331 Major depressive disorder, recurrent, moderate: Secondary | ICD-10-CM

## 2016-11-03 NOTE — Progress Notes (Signed)
   THERAPY PROGRESS NOTE  Session Time: 60min  Participation Level: Active  Behavioral Response: Casual and Well GroomedAlertEuthymic  Type of Therapy: Individual Therapy  Treatment Goals addressed: Coping  Interventions: Strength-based and Supportive  Summary: Haley LyonsCynthia L [name] is a 60 y.o. female who presents with a euthymic mood and appropriate affect. She reported that she has had a difficult holiday period due to a recent outpatient heart surgery. She shared that her doctor found an almost-clogged artery and they did surgery to clear the blockage. Haley [name] shared her feelings of fear and anxiety before the surgery. She reported that she has been feeling better in recovery, mostly because when she wakes up, she feels rested now instead of tired. She shared that she talked with someone about her mother, which she has never done outside of therapy. She expressed the relief she experienced after openly sharing about her mother, though she now has doubts about why she was so open. Haley [name] agreed that she has done significant healing over the course of counseling. She reported that her ultimate goal is to be able to talk about her mother with her siblings, something that they have never done.   Suicidal/Homicidal: Nowithout intent/plan  Therapist Response: LCSW utilized supportive counseling techniques throughout the session in order to validate emotions and encourage open expression of emotion. LCSW and Haley [name] processed about her recent emotions and stressors. LCSW provided affirmations to Haley [name] for sharing about her mother, as it is another step in the healing process.  Plan: Return again in 2 weeks.  Diagnosis: Axis I: See current hospital problem list    Axis II: No diagnosis    Haley Simmeratosha Odell Fasching, LCSW 11/03/2016

## 2016-11-08 ENCOUNTER — Telehealth: Payer: Self-pay | Admitting: Cardiology

## 2016-11-08 NOTE — Telephone Encounter (Signed)
Pt calling in to request a follow-up appt with Dr Delton SeeNelson, post cath.  Pt states she is doing pretty well, and her energy level has tremendously improved.  Pt states however, she has had 3 episodes where she experiences "twinges" of chest pain.  Pt states it "feels like twinges of chest pain, that last for a second, then dissipate." Pt states she is having no SOB or DOE.  Pt states that her sob has actually improved.  Pt states that her chest pain does not radiate in nature.  Pt states she has no dizziness, pre-syncopal or syncopal episodes.  Pt states she is probably over-exerting herself, but would feel more comfortable to come in and discuss her symptoms with Dr Delton SeeNelson, at her next available appt.  Offered the pt to see an Extender on Dr Morgan Stanleyelson's team for this week, and pt states she prefers seeing Dr Delton SeeNelson. Offered the pt to see Dr Delton SeeNelson for this coming up Monday 11/13/16 at 0815, due to a recent cancellation noted on her schedule.  Advised the pt to utilize her Nitroglycerin if needed, until evaluated by Dr Delton SeeNelson on 1/8.  Educated the pt on how to appropriately administer Nitroglycerin.  Advised the pt that if between now and next Monday, if she becomes acutely distressed from a cardiac perspective, then she should seek immediate medical attention and/or notify our office.  Pt verbalized understanding and agrees with this plan.  Pt gracious for all the assistance provided. Will send this message to Dr Delton SeeNelson as an Lorain ChildesFYI.

## 2016-11-08 NOTE — Telephone Encounter (Signed)
New message  Pt call requesting to speak with RN to report how she has been since the procedure. Pt also wanted to know if she would need to make a f/u appt. Please call back to discuss

## 2016-11-12 ENCOUNTER — Other Ambulatory Visit: Payer: Self-pay | Admitting: Internal Medicine

## 2016-11-13 ENCOUNTER — Ambulatory Visit (INDEPENDENT_AMBULATORY_CARE_PROVIDER_SITE_OTHER): Payer: BLUE CROSS/BLUE SHIELD | Admitting: Cardiology

## 2016-11-13 ENCOUNTER — Ambulatory Visit: Payer: Self-pay | Admitting: Internal Medicine

## 2016-11-13 ENCOUNTER — Encounter: Payer: Self-pay | Admitting: Cardiology

## 2016-11-13 VITALS — BP 124/64 | HR 91 | Ht 61.0 in | Wt 233.0 lb

## 2016-11-13 DIAGNOSIS — E782 Mixed hyperlipidemia: Secondary | ICD-10-CM

## 2016-11-13 DIAGNOSIS — R072 Precordial pain: Secondary | ICD-10-CM | POA: Diagnosis not present

## 2016-11-13 DIAGNOSIS — I10 Essential (primary) hypertension: Secondary | ICD-10-CM

## 2016-11-13 DIAGNOSIS — I2510 Atherosclerotic heart disease of native coronary artery without angina pectoris: Secondary | ICD-10-CM

## 2016-11-13 NOTE — Patient Instructions (Signed)
Medication Instructions:   Your physician recommends that you continue on your current medications as directed. Please refer to the Current Medication list given to you today.    Follow-Up:  Your physician wants you to follow-up in: 6 MONTHS WITH DR Krejci You will receive a reminder letter in the mail two months in advance. If you don't receive a letter, please call our office to schedule the follow-up appointment.        If you need a refill on your cardiac medications before your next appointment, please call your pharmacy.   

## 2016-11-13 NOTE — Progress Notes (Signed)
Cardiology Office Note    Date:  11/13/2016   ID:  Haley [name], DOB [birth year], MRN [id number]  PCP:  Julieanne Manson, MD  Cardiologist:   Tobias Alexander, MD   Chief complaint: Chest pain, dyspnea on exertion.  History of Present Illness:   61 year old female with prior medical history of obesity, hypertension, well-controlled known insulin-dependent diabetes mellitus with latest hemoglobin A1c of 5.8 %. The patient is coming with concern of palpitations that happen almost daily and are sometimes associated with dizziness but she never experienced syncopal episodes. Her palpitations usually last seconds but on occasion up to minutes and gets when she becomes symptomatic. The patient is also experiencing worsening dyspnea on exertion, activity of daily is leaving like walking stairs would make her feel short of breath. She feels this is progressively worsening. The patient also states that she has been on blood pressure medication for years but lately her primary care physician had problems controlling diet as her blood pressure keeps increasing despite her being compliant to her meds. She states that today's measurement has been well as it has been in the file and it's usually always elevated. She is also experiencing overall fatigued and mild lower extremity edema.  The patient is coming after 1 months. She underwent an echocardiogram that showed normal biventricular function no significant valvular abnormality and mild LVH. The patient also had a stress test that was done which is a Lexiscan into no prior scar or ischemia. Her labs were all normal including TSH.  08/03/2016 - patient is coming after 2 years, she states that she has developed exertional dyspnea and chest pressure especially while she's walking stairs. She also had 3 episodes of abdominal pain that progressed to chest pain associated with shortness of breath and left her with significant fatigue when she was able to  sleep. She continues to have infrequent palpitations but no syncope. No orthopnea proximal nocturnal dyspnea or lower extremity edema. She has stopped taking atorvastatin about a year ago.  10/04/16 - the patient underwent coronary CTA that showed : 1. Coronary calcium score of 239. This was 43 percentile for age and sex matched control. 2. Normal coronary origin with right dominance. 3. 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 We will send additional analysis for CT FFT evaluation once available.  The patient states that she feels better, she continues to have some SOB, but no significant chest pain. She is on no statins as she couldn't tolerate low dose of atorvastatin and pravastatin. She has orange card for insurance and lisinopril was switched Benicar.   11/13/2016 - the patient is coming after 2 months, she underwent left cardiac catheterization that showed 50% stenosis in the right PDA and medical management was recommended. She feels significantly better she feels like she has more energy started to exercise without any significant symptoms. She needs to use sublingual nitroglycerin. She was seen by our pharmacist who applied for Zetia for her. If Zetia provides no improvement we will apply for Praluent.  Past Medical History:  Diagnosis Date  . Diabetes mellitus without complication (HCC)   . Hyperlipidemia   . Hypertension   . Shingles    Past Surgical History:  Procedure Laterality Date  . ABDOMINAL HYSTERECTOMY  1994   Laparoscopic  . CARDIAC CATHETERIZATION N/A 10/23/2016   Procedure: Left Heart Cath and Coronary Angiography;  Surgeon: Kathleene Hazel, MD;  Location: [facility] INVASIVE CV LAB;  Service: Cardiovascular;  Laterality: N/A;  . CHOLECYSTECTOMY  1997   Laparoscopic  .  WISDOM TOOTH EXTRACTION      Current Medications: Outpatient Medications Prior to Visit  Medication Sig Dispense Refill  . amLODipine (NORVASC) 5 MG tablet Take 1 tablet (5 mg total) by  mouth daily. 30 tablet 11  . aspirin 81 MG tablet Take 81 mg by mouth daily.    Marland Kitchen. desloratadine (CLARINEX) 5 MG tablet Take 1 tablet (5 mg total) by mouth daily. 30 tablet 11  . Flax Oil-Fish Oil-Borage Oil (FISH OIL-FLAX OIL-BORAGE OIL PO) Take 2 capsules by mouth daily. Reported on 12/28/2015    . Ginger Root POWD 1 scoop by Does not apply route daily. Reported on 05/11/2016 (1/3 of a teaspoon) with a cup of coffee each day - about 2 cups of coffee daily    . glimepiride (AMARYL) 4 MG tablet Take 1 tablet (4 mg total) by mouth daily with breakfast. 30 tablet 11  . mometasone (NASONEX) 50 MCG/ACT nasal spray 2 sprays each nostril daily (Patient taking differently: Place 2 sprays into the nose daily. 2 sprays each nostril daily) 17 g 11  . Multiple Vitamins-Minerals (MULTIVITAMIN WITH MINERALS) tablet Take 1 tablet by mouth daily.    . nitroGLYCERIN (NITROSTAT) 0.4 MG SL tablet 1 tab under tongue for chest pain.  May repeat every 5 minutes x 2 if chest pain not relieved 25 tablet 0  . olmesartan (BENICAR) 40 MG tablet Take 40 mg by mouth daily.    . Turmeric Curcumin 500 MG CAPS Take 1 capsule by mouth daily. Reported on 05/11/2016     No facility-administered medications prior to visit.      Allergies:   Atorvastatin and Pravastatin   Social History   Social History  . Marital status: Single    Spouse name: N/A  . Number of children: 2  . Years of education: 12+   Occupational History  . Print production plannerLeasing Agent     Retired now   Social History Main Topics  . Smoking status: Never Smoker  . Smokeless tobacco: Never Used  . Alcohol use 0.0 oz/week     Comment: occasionally  . Drug use: No  . Sexual activity: No   Other Topics Concern  . None   Social History Narrative   Did get some post high school training/education   Lives alone     Family History:  The patient's family history includes Diabetes in her brother; Hypertension in her brother, father, and son; Seizures in her brother;  Stroke in her maternal aunt.   ROS:   Please see the history of present illness.    ROS All other systems reviewed and are negative.   PHYSICAL EXAM:   VS:  BP 124/64   Pulse 91   Ht 5\' 1"  (1.549 m)   Wt 233 lb (105.7 kg)   BMI 44.02 kg/m    GEN: Obese, well developed, in no acute distress  HEENT: normal  Neck: no JVD, carotid bruits, or masses Cardiac: RRR; no murmurs, rubs, or gallops,no edema  Respiratory:  clear to auscultation bilaterally, normal work of breathing GI: soft, nontender, nondistended, + BS MS: no deformity or atrophy  Skin: warm and dry, no rash Neuro:  Alert and Oriented x 3, Strength and sensation are intact Psych: euthymic mood, full affect  Wt Readings from Last 3 Encounters:  11/13/16 233 lb (105.7 kg)  10/23/16 236 lb (107 kg)  10/06/16 236 lb (107 kg)    Studies/Labs Reviewed:   EKG:  Sinus rhythm, right axis deviation, nonspecific ST-T wave  abnormality unchanged from prior.  Recent Labs: 10/04/2016: ALT 29 10/18/2016: BUN 13; Creat 1.17; Hemoglobin 14.1; Platelets 299; Potassium 4.3; Sodium 139   Lipid Panel    Component Value Date/Time   CHOL 200 (H) 07/18/2016 0837   TRIG 113 07/18/2016 0837   HDL 49 07/18/2016 0837   LDLCALC 128 (H) 07/18/2016 0837   LDLDIRECT 134 (H) 07/22/2013 1620   TTE 04/07/2014  - Left ventricle: The cavity size was normal. Wall thickness was increased in a pattern of mild LVH. The estimated ejection fraction was 55%. Wall motion was normal; there were no regional wall motion abnormalities. Doppler parameters are consistent with abnormal left ventricular relaxation (grade 1 diastolic dysfunction). - Aortic valve: There was no stenosis. - Mitral valve: There was no significant regurgitation. - Right ventricle: The cavity size was normal. Systolic function was normal. - Pulmonary arteries: No complete TR doppler jet so unable to estimate PA systolic pressure. - Inferior vena cava: The vessel  was normal in size. The respirophasic diameter changes were in the normal range (>= 50%), consistent with normal central venous pressure.  Impressions:  - Normal LV size with mild LV hypertrophy, EF 55%. Normal RV size and systolic function. No significant valvular abnormalities.   CT coronary: 08/14/16  IMPRESSION: 1. Coronary calcium score of 239. This was 70 percentile for age and sex matched control.  2. Normal coronary origin with right dominance.  3. 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 We will send additional analysis for CT FFT evaluation once available.  4. Mildly dilated pulmonary artery measuring 31 x 27 mm suspicious for pulmonary hypertension.  Tobias Alexander     ASSESSMENT:    No diagnosis found.   PLAN:   1.CAD - with coronary calcium score of 239. This was 74 percentile for age and sex matched control and moderate diffuse CAD, possibly obstructive. CT FFR suggestive of moderate-severe stenosis, however LHC showed only 50% stenosis in R PDA. The patient feels much better. We will continue ASA, olmesartan, applied for Zetia. The patient is strongly encouraged to continue exercising.   2. Hypertensive heart disease we doubt CHF - well controlled.  3. Hyperlipidemia, with LDL of 128 in a diabetic person,plan as above. Dr Rennis Chris: "Hyperlipidemia - LDL 128mg /dL above goal 70mg /dL given diffuse CAD and elevated coronary calcium score. Pt is intolerant to low dose rosuvastatin, atorvastatin, and pravastatin. Pt uses the orange card for medication coverage at [facility] Dept which has a limited formulary. Will start Zetia 10mg  daily. Pt filled out patient assistance paperwork for Zetia coverage. She will call clinic once she receives her first shipment so we can schedule follow up lab work."  Pt signed informed consent for [facility] lipid registry.  4. Palpitations that are associated with dizziness -  48-hour Holter monitor showed   only few PVCs.Resolved.   Followup in 6 months.   Medication Adjustments/Labs and Tests Ordered: Current medicines are reviewed at length with the patient today.  Concerns regarding medicines are outlined above.  Medication changes, Labs and Tests ordered today are listed in the Patient Instructions below. There are no Patient Instructions on file for this visit.   Signed, Tobias Alexander, MD  11/13/2016 8:41 AM    [facility] [address] [place], [place], [place]  [address] Phone: [phone]; Fax: [phone]

## 2016-11-14 ENCOUNTER — Ambulatory Visit: Payer: Self-pay | Admitting: Licensed Clinical Social Worker

## 2016-11-14 DIAGNOSIS — F331 Major depressive disorder, recurrent, moderate: Secondary | ICD-10-CM

## 2016-11-16 ENCOUNTER — Encounter: Payer: Self-pay | Admitting: Internal Medicine

## 2016-11-16 ENCOUNTER — Ambulatory Visit (INDEPENDENT_AMBULATORY_CARE_PROVIDER_SITE_OTHER): Payer: Self-pay | Admitting: Internal Medicine

## 2016-11-16 VITALS — BP 132/84 | HR 86 | Resp 12 | Ht 61.25 in | Wt 232.0 lb

## 2016-11-16 DIAGNOSIS — I2510 Atherosclerotic heart disease of native coronary artery without angina pectoris: Secondary | ICD-10-CM

## 2016-11-16 DIAGNOSIS — E119 Type 2 diabetes mellitus without complications: Secondary | ICD-10-CM | POA: Diagnosis not present

## 2016-11-16 DIAGNOSIS — I10 Essential (primary) hypertension: Secondary | ICD-10-CM | POA: Diagnosis not present

## 2016-11-16 LAB — GLUCOSE, POCT (MANUAL RESULT ENTRY): POC GLUCOSE: 96 mg/dL (ref 70–99)

## 2016-11-16 NOTE — Progress Notes (Signed)
   Subjective:    Patient ID: Haley [name], female    DOB: Feb 15, 1956, 61 y.o.   MRN: [id number]  HPI   1.  CAD: Most prominent disease:  Had 50% stenosis in the right PDA with cardiac cath and medical management recommended.  Echo with EF of 55%, no regional wall motion abnormalities Intolerance of statins so has been difficult to get cholesterol at goal.  Zetia is being ordered to start.   Many questions regarding changing to a ovo-vegetarian diet.  Discussed making sure she is getting adequate protein.    2.  DM:  Last A1C was 6.3%  Is dancing around the house for physical activity without chest pain or dyspnea.  Continues with Glimepiride.  3.  Essential Hypertension:  Taking Amlodipine and Olmesartan.  Diet and physical activity as noted previously  Current Meds  Medication Sig  . amLODipine (NORVASC) 5 MG tablet Take 1 tablet (5 mg total) by mouth daily.  Marland Kitchen. aspirin 81 MG tablet Take 81 mg by mouth daily.  Marland Kitchen. desloratadine (CLARINEX) 5 MG tablet Take 1 tablet (5 mg total) by mouth daily.  . Flax Oil-Fish Oil-Borage Oil (FISH OIL-FLAX OIL-BORAGE OIL PO) Take 2 capsules by mouth daily. Reported on 12/28/2015  . Ginger Root POWD 1 scoop by Does not apply route daily. Reported on 05/11/2016 (1/3 of a teaspoon) with a cup of coffee each day - about 2 cups of coffee daily  . glimepiride (AMARYL) 4 MG tablet Take 1 tablet (4 mg total) by mouth daily with breakfast.  . Multiple Vitamins-Minerals (MULTIVITAMIN WITH MINERALS) tablet Take 1 tablet by mouth daily.  Marland Kitchen. olmesartan (BENICAR) 40 MG tablet Take 40 mg by mouth daily.  . Turmeric Curcumin 500 MG CAPS Take 1 capsule by mouth daily. Reported on 05/11/2016   Allergies  Allergen Reactions  . Atorvastatin Other (See Comments)    Pt states causes bilateral lower extremity muscle cramps  . Pravastatin Nausea And Vomiting and Other (See Comments)    Causes dizziness      Review of Systems     Objective:   Physical Exam  Down 4 lbs  from  Beginning of December NAD Lungs:  CTA CV:  RRR without murmur or rub, radial and DP pulses normal and equal LE:  No edema        Assessment & Plan:  Call tomorrow for appt in 2 weeks.  1.  CAD:  Good attitude with lifestyle changes.  Would like to coe in in 2 weeks after getting started on new way of eating and increasing physical activity as will keep her on track.  Discussed making short term goals and to get started immediately with goals written on calendar.  2.  DM:  Good control, though not as good as previously.  As above with lifestyle changes  3.  Essential hypertension:  Fair control.  Would like to see in 120/70 range. As above.

## 2016-11-16 NOTE — Telephone Encounter (Signed)
Left message for patient to call back as to if she is using the cream or not

## 2016-11-16 NOTE — Progress Notes (Signed)
   THERAPY PROGRESS NOTE  Session Time: 60min  Participation Level: Active  Behavioral Response: Casual and Well GroomedAlertEuthymic  Type of Therapy: Individual Therapy  Treatment Goals addressed: Coping  Interventions: Strength-based and Supportive  Summary: Haley LyonsCynthia L [name] is a 61 y.o. female who presents with a euthymic mood and appropriate affect. She reported that physically she has been feeling better since her heart procedure, as she has a lot more energy now. She reported that she has finally started getting some exercise, though she knows she needs to do more. She shared that recently she has been feeling more stressed because her young granddaughter found out that she is pregnant. Haley [name] expressed her disappointment that her granddaughter was going through this at a young age, but stated that the family would support her and "get through it." She denied that this was bringing up feelings about her own teenage pregnancy. She reflected on the fact that she, her daughter, and now her granddaughter, have had teenage pregnancies. Haley [name] considered how to make her relationship with her daughter better, as it tends to cause her a lot of stress. She appeared receptive to LCSW feedback about allowing the relationship to be a two-way street instead of solely being the support person for her daughter; she reported that she would consider talking to her daughter about her stress as well. She shared about the benefits she is feeling from sharing more feelings with her son.   Suicidal/Homicidal: Nowithout intent/plan  Therapist Response: LCSW utilized supportive counseling techniques throughout the session in order to validate emotions and encourage open expression of emotion. LCSW checked in with Haley [name] to see if she felt ready to do another EMDR session; Haley [name] declined at this time. LCSW and Haley [name] processed about the current family stress and brainstormed how to improve the  situation.  Plan: Return again in 2 weeks.  Diagnosis: Axis I: See current hospital problem list    Axis II: No diagnosis    [name], LCSW 11/16/2016

## 2016-11-19 ENCOUNTER — Encounter: Payer: Self-pay | Admitting: Internal Medicine

## 2016-11-28 ENCOUNTER — Ambulatory Visit: Payer: Self-pay | Admitting: Licensed Clinical Social Worker

## 2016-11-28 DIAGNOSIS — F331 Major depressive disorder, recurrent, moderate: Secondary | ICD-10-CM

## 2016-11-28 NOTE — Progress Notes (Signed)
   THERAPY PROGRESS NOTE  Session Time: 45min  Participation Level: Active  Behavioral Response: CasualAlertEuthymic  Type of Therapy: Individual Therapy  Treatment Goals addressed: Coping  Interventions: Strength-based and Supportive  Summary: Haley LyonsCynthia L [name] is a 61 y.o. female who presents with a euthymic mood and appropriate affect. She reported that she was still stressed by her granddaughter's new pregnancy, especially as her granddaughter has been very moody lately. She shared her disappointment that her grandson has decided to drop out of school, as he is in the 12th grade. Haley [name] shared that she had talked with one of her brothers at length about their mother, something that she has never been able to do. She expressed the satisfaction and joy that she got out of the conversation, as she had not become overly emotional. She shared that it was a relief to finally talk with him. Haley [name] reflected on the progress she has made in moving through her emotions about her childhood. She shared about her shift from only defending her mother to slowly realizing that her mother was very flawed, and was not even a good mother at times. She expressed her sadness that her mother was not able to support her when she needed it the most.    Suicidal/Homicidal: Nowithout intent/plan  Therapist Response: Haley [name] utilized supportive counseling techniques throughout the session in order to validate emotions and encourage open expression of emotion. Haley [name] and Haley [name] processed about her emotions towards her mother. Haley [name] and Haley [name] reflected on the incredible progress she is making.  Plan: Return again in 2 weeks.  Diagnosis: Axis I: See current hospital problem list    Axis II: No diagnosis    Haley Simmeratosha Lasheika Ortloff, Haley [name] 11/28/2016

## 2016-12-11 ENCOUNTER — Encounter: Payer: Self-pay | Admitting: Internal Medicine

## 2016-12-11 ENCOUNTER — Ambulatory Visit (INDEPENDENT_AMBULATORY_CARE_PROVIDER_SITE_OTHER): Payer: Self-pay | Admitting: Internal Medicine

## 2016-12-11 ENCOUNTER — Ambulatory Visit: Payer: Self-pay | Admitting: Licensed Clinical Social Worker

## 2016-12-11 VITALS — BP 160/88 | HR 78 | Resp 12 | Ht 61.0 in | Wt 230.0 lb

## 2016-12-11 DIAGNOSIS — F331 Major depressive disorder, recurrent, moderate: Secondary | ICD-10-CM

## 2016-12-11 DIAGNOSIS — I10 Essential (primary) hypertension: Secondary | ICD-10-CM

## 2016-12-11 DIAGNOSIS — H9313 Tinnitus, bilateral: Secondary | ICD-10-CM | POA: Diagnosis not present

## 2016-12-11 DIAGNOSIS — E119 Type 2 diabetes mellitus without complications: Secondary | ICD-10-CM | POA: Diagnosis not present

## 2016-12-11 DIAGNOSIS — M25512 Pain in left shoulder: Secondary | ICD-10-CM

## 2016-12-11 LAB — GLUCOSE, POCT (MANUAL RESULT ENTRY): POC GLUCOSE: 165 mg/dL — AB (ref 70–99)

## 2016-12-11 MED ORDER — METAXALONE 400 MG PO TABS: 400.0000 mg | ORAL_TABLET | Freq: Three times a day (TID) | ORAL | 0 refills | Status: DC

## 2016-12-11 NOTE — Progress Notes (Addendum)
   Subjective:    Patient ID: Haley [name], female    DOB: September 20, 1956, 61 y.o.   MRN: [id number]  HPI   1.  Ringing in ears for about 1 month in both ears.  States started with left ear.  Describes the ringing actually more as a hum.  Has awakened her from sleep.  Does not feel she has had hearing loss.  Has had a couple of episodes of spinning sensations--lasts just a couple of seconds and gone. No family history of any of these symptoms.   Was placing Castor in ears to see if would help end of last week, but did not note improvement.   Does have history when was young with exposure to loud music at night clubs--would sit by speakers  2.  Throbbing pain medial to scapula on left.  Awakened her from sleep 5 nights ago.  Jabbing, sticking pain.  Did start exercising last week.  Walk Fit thing- does do arm movements with this, but low intensity.  Has also noted her range of motion of left shoulder decreasing over same period of time.   3.  Elevated BP: not clear if missing bp meds at times.  Current Meds  Medication Sig  . aspirin 81 MG tablet Take 81 mg by mouth daily.  Marland Kitchen. desloratadine (CLARINEX) 5 MG tablet Take 1 tablet (5 mg total) by mouth daily.  . Flax Oil-Fish Oil-Borage Oil (FISH OIL-FLAX OIL-BORAGE OIL PO) Take 2 capsules by mouth daily. Reported on 12/28/2015  . Multiple Vitamins-Minerals (MULTIVITAMIN WITH MINERALS) tablet Take 1 tablet by mouth daily.  Marland Kitchen. olmesartan (BENICAR) 40 MG tablet Take 40 mg by mouth daily.  . Turmeric Curcumin 500 MG CAPS Take 1 capsule by mouth daily. Reported on 05/11/2016  . [DISCONTINUED] amLODipine (NORVASC) 5 MG tablet Take 1 tablet (5 mg total) by mouth daily.  . [DISCONTINUED] ezetimibe (ZETIA) 10 MG tablet Take 10 mg by mouth daily.  . [DISCONTINUED] glimepiride (AMARYL) 4 MG tablet Take 1 tablet (4 mg total) by mouth daily with breakfast.   Allergies:  Pravastatin/Atorvastatin    Review of Systems     Objective:   Physical Exam    HEENT:  PERRL, EOMI, TMs pearly gray, throat without injection.   Neck:  Supple, No adenopathy Chest:  CTA CV:  RRR with normal S1 and S2, No S3, S4 or murmur, radial and DP pulses normal and equal. Neuro:  A & O X 3, CN II-XII grossly intact, DTRs 2+/5 throughout, Motor 5/5 throughout, sensory grossly normal.  Gait normal, rapid alternating motions normal, finger to nose to finger normal [employer] negative. Left shoulder with full ROM, but with pain at full abduction.  Tender over trap to shoulder.  Mild tenderness subacromial bursa.  Decreased strength against downward force on abducted internally rotates shoulder--due to pain.           Assessment & Plan:  1.  Tinnitus:  Patient feel this is due to [facility].  Hold Zetia for now.  Referral to Audiology.    2.  Left Rotator Cuff Syndrome:  Metaxolone muscle relaxant at bedtime in particular.  Ibuprofen 400-800 mg twice daily as needed for 2 weeks, then prn. Referral to PT.  3.  Essential Hypertension:  To take meds regularly without missing. Bring meds into visits

## 2016-12-11 NOTE — Patient Instructions (Signed)
[facility] [employer]  Ibuprofen 200 mg 2-4 tabs twice daily with food for 2 weeks, then every 6 hours as needed thereafter

## 2016-12-12 ENCOUNTER — Telehealth: Payer: Self-pay | Admitting: Pharmacist

## 2016-12-12 NOTE — Progress Notes (Signed)
   THERAPY PROGRESS NOTE  Session Time: 45min  Participation Level: Active  Behavioral Response: CasualAlertEuthymic  Type of Therapy: Individual Therapy  Treatment Goals addressed: Coping  Interventions: Strength-based and Supportive  Summary: Haley [name] is a 61 y.o. female who presents with a euthymic mood and appropriate affect. She reported that she was feeling good overall. She shared that she was finally in a clinical study and was taking a medication for cholesterol; she reported that the side effects seem to include joint pain but that she was willing to continue to take it to see if the pain went away. Haley [name] shared that she is feeling upbeat because her son is going to come visit next week and then she might go visit him at his house as well. She shared that she had a great conversation with her daughter about some of her memories of her mother, which she has never shared with her daughter. She expressed the relief she feels in being able to openly talk about her mother and her childhood, without becoming upset. Haley [name] shared about the struggle of watching her granddaughter preparing to be a young mother.   Suicidal/Homicidal: Nowithout intent/plan  Therapist Response: LCSW utilized supportive counseling techniques throughout the session in order to validate emotions and encourage open expression of emotion. LCSW checked in regarding medication compliance and health issues. LCSW and Haley [name] processed about her progress in addressing her trauma from the past.  Plan: Return again in 3 weeks.  Diagnosis: Axis I: See current hospital problem list    Axis II: No diagnosis    Nilda Simmeratosha Linnae Rasool, LCSW 12/12/2016

## 2016-12-12 NOTE — Telephone Encounter (Signed)
Called pt to f/u with [employer] patient assistance for Zetia. Pt reports that she had ringing in her ears that became progressively worse and joint pain that limited her range of motion after 3 days on Zetia. She stopped taking Zetia last week and her symptoms improved within a day or two.  Pt is also intolerant to atorvastatin 10mg  daily, pravastatin 10mg  daily, and rosuvastatin 10mg  daily. Most recent LDL 128 above goal 70mg /dL given history of CAD.  PCSK9i is the only option left to bring LDL to goal. Pt now has [employer] insurance. Will submit prior authorization for Praluent. Pt will come to clinic for first injection after PCSK9i approval.

## 2016-12-21 ENCOUNTER — Telehealth: Payer: Self-pay | Admitting: Internal Medicine

## 2016-12-21 NOTE — Telephone Encounter (Signed)
To Dr. Mulberry for FYI 

## 2016-12-21 NOTE — Telephone Encounter (Signed)
Patient was supposed to call back Friday 12/15/16 but forgot. Cholesterol medication she was taking was causing pain in her joints and ringing in her ears. She started to feel better once she stopped taking it as ordered by doctor.

## 2016-12-27 ENCOUNTER — Telehealth: Payer: Self-pay | Admitting: Pharmacist

## 2016-12-27 NOTE — Telephone Encounter (Signed)
Waited on hold with [employer] for 40 minutes to be told that pt's appeal letter was denied because the pt did not fill out a form consenting for our office to write an appeals letter on her behalf. [employer] has never required this before. Called pt and advised her to sign the form she should be receiving in the mail so that [employer] will approve Praluent.

## 2016-12-29 NOTE — Telephone Encounter (Signed)
Which medication is she talking about?  Can you get the name?

## 2017-01-01 ENCOUNTER — Ambulatory Visit: Payer: Self-pay | Admitting: Licensed Clinical Social Worker

## 2017-01-01 DIAGNOSIS — F331 Major depressive disorder, recurrent, moderate: Secondary | ICD-10-CM

## 2017-01-01 NOTE — Progress Notes (Signed)
   THERAPY PROGRESS NOTE  Session Time: 45min  Participation Level: Active  Behavioral Response: CasualAlertDepressed  Type of Therapy: Individual Therapy  Treatment Goals addressed: Coping  Interventions: Strength-based and Supportive  Summary: Haley LyonsCynthia L [name] is a 61 y.o. female who presents with a slightly depressed mood and appropriate affect. She reported that she has been feeling fatigued and down for the past few days but did not know why. After processing, she was able to identify that her low mood started when her son left a few days ago, at the end of his visit. She shared that the visit was very positive and energizing, which made his departure difficult. [name] completed a PHQ-9 to assess depression symptoms, and scored a 5, indicating minimal depressive symptoms currently. She reported that she is taking her medications and working on her goals of eating healthy and exercising. She shared that she is considering eating a vegetarian diet and asked for advice re: protein sources.    Suicidal/Homicidal: Nowithout intent/plan  Therapist Response: LCSW utilized supportive counseling techniques throughout the session in order to validate emotions and encourage open expression of emotion. LCSW administered a PHQ-9 in order to assess the level of current depressive symptoms. LCSW and [name] processed about her health issues and what steps she is taking to live a healthier lifestyle.  Plan: Return again in 2 weeks.  Diagnosis: Axis I: See current hospital problem list    Axis II: No diagnosis    Nilda Simmeratosha Yohann Curl, LCSW 01/01/2017

## 2017-01-02 NOTE — Telephone Encounter (Signed)
Spoke with patient. States the medication she stopped was prescribed by another physician. The medication was Zetia. States that since she has stopped the zetia she no longer has the joint pain or ringing in her ears.

## 2017-01-03 ENCOUNTER — Ambulatory Visit: Payer: No Typology Code available for payment source | Admitting: Cardiology

## 2017-01-08 ENCOUNTER — Other Ambulatory Visit: Payer: Self-pay | Admitting: Internal Medicine

## 2017-01-08 DIAGNOSIS — E119 Type 2 diabetes mellitus without complications: Secondary | ICD-10-CM

## 2017-01-09 ENCOUNTER — Other Ambulatory Visit: Payer: Self-pay

## 2017-01-09 DIAGNOSIS — I10 Essential (primary) hypertension: Secondary | ICD-10-CM

## 2017-01-09 MED ORDER — AMLODIPINE BESYLATE 5 MG PO TABS: 5.0000 mg | ORAL_TABLET | Freq: Every day | ORAL | 11 refills | Status: DC

## 2017-01-09 MED ORDER — CETIRIZINE HCL 10 MG PO TABS: 10.0000 mg | ORAL_TABLET | Freq: Every day | ORAL | 11 refills | Status: DC

## 2017-01-15 NOTE — Telephone Encounter (Signed)
We will need to talk about cholesterol control then at her next visit--please let her know to keep working on improving her eating habits and to gradually increase her DAILY physical activity.   Good to know her ears are better.

## 2017-01-16 ENCOUNTER — Other Ambulatory Visit: Payer: Self-pay | Admitting: Licensed Clinical Social Worker

## 2017-01-17 NOTE — Telephone Encounter (Signed)
Left detailed message for patient.

## 2017-01-22 ENCOUNTER — Ambulatory Visit (INDEPENDENT_AMBULATORY_CARE_PROVIDER_SITE_OTHER): Payer: Self-pay | Admitting: Licensed Clinical Social Worker

## 2017-01-22 DIAGNOSIS — F418 Other specified anxiety disorders: Secondary | ICD-10-CM

## 2017-01-22 DIAGNOSIS — F3341 Major depressive disorder, recurrent, in partial remission: Secondary | ICD-10-CM

## 2017-01-23 NOTE — Progress Notes (Signed)
   THERAPY PROGRESS NOTE  Session Time: 60min  Participation Level: Active  Behavioral Response: CasualAlertEuthymic  Type of Therapy: Individual Therapy  Treatment Goals addressed: Coping  Interventions: Strength-based and Supportive  Summary: Geoffery LyonsCynthia L Minami is a 61 y.o. female who presents with a positive mood and appropriate affect. She reported that she has been feeling good lately, despite a brief cold last week. She shared that she has lost a few more pounds but is frustrated by the slow pace; she committed to walking outside more once it warms up. Aram Beechamynthia and LCSW discussed LCSW's upcoming maternity leave and Briceida's options for behavioral health care. [name] decided that she will be ready to complete counseling by June. She reviewed the many ways that her life and emotional health has changed over the past year. She expressed hopefulness about her future now that her self-esteem is in a better place. [name] shared that she was recently offered a part-time job and is considering taking it.   Suicidal/Homicidal: Nowithout intent/plan  Therapist Response: LCSW utilized supportive counseling techniques throughout the session in order to validate emotions and encourage open expression of emotion. LCSW shared information about maternity leave and options; LCSW validated Avyanna's decision to finish counseling by June. LCSW reflected on the significant changes that [name] has made over the course of counseling: processing her trauma, opening up to family members about her past, and increasing her self-esteem.   Plan: Return again in 2 weeks.  Diagnosis: Axis I: See current hospital problem list    Axis II: No diagnosis    Nilda Simmeratosha Lisamarie Coke, LCSW 01/23/2017

## 2017-01-31 ENCOUNTER — Telehealth: Payer: Self-pay | Admitting: Pharmacist

## 2017-01-31 MED ORDER — ALIROCUMAB 75 MG/ML ~~LOC~~ SOPN: 1.0000 "pen " | PEN_INJECTOR | SUBCUTANEOUS | 11 refills | Status: DC

## 2017-01-31 NOTE — Telephone Encounter (Signed)
Pt called to report that she [facility]. Provided pt with their number so that she can call them in a week if they have not reached out to set up shipment.  Pt will call if she has any problems and will also call once she receives her first shipment. She would like to come into clinic to give her first injection.

## 2017-02-05 ENCOUNTER — Ambulatory Visit (INDEPENDENT_AMBULATORY_CARE_PROVIDER_SITE_OTHER): Payer: Self-pay | Admitting: Licensed Clinical Social Worker

## 2017-02-05 DIAGNOSIS — F418 Other specified anxiety disorders: Secondary | ICD-10-CM

## 2017-02-05 DIAGNOSIS — F3341 Major depressive disorder, recurrent, in partial remission: Secondary | ICD-10-CM

## 2017-02-06 NOTE — Progress Notes (Signed)
   THERAPY PROGRESS NOTE  Session Time:  Participation Level: Active  Behavioral Response: Neat and Well GroomedAlertEuthymic  Type of Therapy: Individual Therapy  Treatment Goals addressed: Coping  Interventions: Strength-based and Supportive  Summary: Haley [name] is a 61 y.o. female who presents with a positive mood and appropriate affect. She reported that recently she has had some days with reduced energy but feels good today. She acknowledged that she needs to get more exercise to help with energy. She reported that she has been forgetful about taking her medications. She appeared receptive to LCSW feedback about setting an alarm on her phone to remind herself. Haley [name] shared about the stress that she takes on with her family, particularly her granddaughter who is pregnant. She presented as proud of her self when she shared that she set more limits with her granddaughter regarding rides to and from work. She shared that she is continuing to work on stepping back from her family members' stress and focusing on herself.   Suicidal/Homicidal: Nowithout intent/plan  Therapist Response: LCSW utilized supportive counseling techniques throughout the session in order to validate emotions and encourage open expression of emotion. LCSW provided affirmations to Haley [name] for starting to set clear boundaries with family. LCSW checked in regarding medication compliance and health issues. LCSW emphasized the importance of taking her medications daily, and to set an alarm if needed.  Plan: Return again in 2 weeks.  Diagnosis: Axis I: See current hospital problem list    Axis II: No diagnosis    Haley Simmer, LCSW 02/06/2017

## 2017-02-15 ENCOUNTER — Telehealth: Payer: Self-pay | Admitting: Pharmacist

## 2017-02-15 DIAGNOSIS — I2510 Atherosclerotic heart disease of native coronary artery without angina pectoris: Secondary | ICD-10-CM

## 2017-02-15 NOTE — Telephone Encounter (Signed)
Pt presented to clinic to give first Praluent injection. She demonstrated appropriate injection technique. Scheduled labs and follow up visit in lipid clinic.

## 2017-02-19 ENCOUNTER — Other Ambulatory Visit: Payer: Self-pay | Admitting: Licensed Clinical Social Worker

## 2017-02-25 ENCOUNTER — Encounter: Payer: Self-pay | Admitting: Internal Medicine

## 2017-02-27 ENCOUNTER — Other Ambulatory Visit: Payer: Self-pay | Admitting: Licensed Clinical Social Worker

## 2017-03-05 ENCOUNTER — Ambulatory Visit: Payer: Self-pay | Admitting: Licensed Clinical Social Worker

## 2017-03-05 ENCOUNTER — Ambulatory Visit (INDEPENDENT_AMBULATORY_CARE_PROVIDER_SITE_OTHER): Payer: BLUE CROSS/BLUE SHIELD | Admitting: Internal Medicine

## 2017-03-05 ENCOUNTER — Encounter: Payer: Self-pay | Admitting: Internal Medicine

## 2017-03-05 VITALS — BP 170/92 | HR 76 | Ht 61.5 in | Wt 230.0 lb

## 2017-03-05 DIAGNOSIS — I10 Essential (primary) hypertension: Secondary | ICD-10-CM | POA: Diagnosis not present

## 2017-03-05 DIAGNOSIS — M67431 Ganglion, right wrist: Secondary | ICD-10-CM

## 2017-03-05 DIAGNOSIS — E119 Type 2 diabetes mellitus without complications: Secondary | ICD-10-CM

## 2017-03-05 DIAGNOSIS — J3089 Other allergic rhinitis: Secondary | ICD-10-CM | POA: Diagnosis not present

## 2017-03-05 DIAGNOSIS — F418 Other specified anxiety disorders: Secondary | ICD-10-CM | POA: Diagnosis not present

## 2017-03-05 DIAGNOSIS — F3341 Major depressive disorder, recurrent, in partial remission: Secondary | ICD-10-CM | POA: Diagnosis not present

## 2017-03-05 MED ORDER — AMLODIPINE BESYLATE 5 MG PO TABS: 5.0000 mg | ORAL_TABLET | Freq: Every day | ORAL | 3 refills | Status: DC

## 2017-03-05 MED ORDER — NITROGLYCERIN 0.4 MG SL SUBL: SUBLINGUAL_TABLET | SUBLINGUAL | 1 refills | Status: DC

## 2017-03-05 MED ORDER — CETIRIZINE HCL 10 MG PO TABS
10.0000 mg | ORAL_TABLET | Freq: Every day | ORAL | 3 refills | Status: DC
Start: 2017-03-05 — End: 2018-04-30

## 2017-03-05 MED ORDER — LISINOPRIL 40 MG PO TABS: 40.0000 mg | ORAL_TABLET | Freq: Every day | ORAL | 3 refills | Status: DC

## 2017-03-05 MED ORDER — MOMETASONE FUROATE 50 MCG/ACT NA SUSP: NASAL | 11 refills | Status: DC

## 2017-03-05 MED ORDER — GLIMEPIRIDE 4 MG PO TABS: ORAL_TABLET | ORAL | 3 refills | Status: DC

## 2017-03-05 NOTE — Progress Notes (Signed)
   Subjective:    Patient ID: Haley [name], female    DOB: 05-20-1956, 61 y.o.   MRN: [id number]  HPI   Here because needs to have meds changed to another pharmacy.  Also, states had first dose of Praluent April 12th for cholesterol and suffered profound fatigue, was lost driving in town,cognitive abilities were significantly affected, nausea and joint pains.  Refuses to consider taking again.   Will no longer be able to get Benicar free through [facility].  Would like to return to Lisinopril 40 mg daily.  Never had a problem with it.     Has a bump on volar right wrist.  Is blaming this on the Praluent as well as came up after her dose.  Current Meds  Medication Sig  . amLODipine (NORVASC) 5 MG tablet Take 1 tablet (5 mg total) by mouth daily.  Marland Kitchen aspirin 81 MG tablet Take 81 mg by mouth daily.  . cetirizine (ZYRTEC) 10 MG tablet Take 1 tablet (10 mg total) by mouth daily.  . Flax Oil-Fish Oil-Borage Oil (FISH OIL-FLAX OIL-BORAGE OIL PO) Take 2 capsules by mouth daily. Reported on 12/28/2015  . Ginger Root POWD 1 scoop by Does not apply route daily. Reported on 05/11/2016 (1/3 of a teaspoon) with a cup of coffee each day - about 2 cups of coffee daily  . glimepiride (AMARYL) 4 MG tablet TAKE ONE TABLET BY MOUTH ONCE DAILY WITH BREAKFAST  . nitroGLYCERIN (NITROSTAT) 0.4 MG SL tablet 1 tab under tongue for chest pain.  May repeat every 5 minutes x 2 if chest pain not relieved  . olmesartan (BENICAR) 40 MG tablet Take 40 mg by mouth daily.  . Turmeric Curcumin 500 MG CAPS Take 1 capsule by mouth daily. Reported on 05/11/2016   Allergies  Allergen Reactions  . Praluent [Alirocumab]     Had significant fatigue and memory loss--was lost driving in town.  Joint pain and nausea as well.  Marland Kitchen Zetia [Ezetimibe] Other (See Comments)    Joint pain and ear ringing  . Atorvastatin Other (See Comments)    Pt states causes bilateral lower extremity muscle cramps  . Pravastatin Nausea And Vomiting and Other  (See Comments)    Causes dizziness    Review of Systems     Objective:   Physical Exam  NAD Lungs:  CTA CV:  RRR without murmur or rub, radial pulses normal and equal Right wrist:  Volar aspect with pea sized cystic lesion below skin.  Nontender.        Assessment & Plan:  1.  Essential Hypertension:  Shows me bp checks from home that have been mainly at goal, though occasionally as high as 140 systolic.   Changing her Benicar back to Lisinopril, which she tolerated fine, but was able to get ICP with Benicar, which she can no longer receive. BP check in 4 weeks.  2.  Ganglion cyst, right wrist:  Not causing problems--no treatment for now.  3.  Meds transferred to [facility] on [address].  4.  Significant side effects to Praluent:  To call Cardiology and let them know she will not be taking again and get her lab appt. Cancelled.  States she will take care of this.

## 2017-03-05 NOTE — Patient Instructions (Signed)
[facility] [address]  [place], [place] [address] [phone]  40% off supplements Ask their recommendations for [facility] regarding inflammation and joint pain.

## 2017-03-06 NOTE — Progress Notes (Signed)
   THERAPY PROGRESS NOTE  Session Time:  Participation Level: Active  Behavioral Response: Casual and Well GroomedAlertEuthymic  Type of Therapy: Individual Therapy  Treatment Goals addressed: Coping  Interventions: Strength-based and Supportive  Summary: Haley [name] is a 61 y.o. female who presents with a positive mood and appropriate affect. She reported that she has had two very difficult weeks, as she received her first injection of a clinical trial drug for cholesterol. She shared that she felt severely fatigued, constipated, and "foggy." She described not being able to think of words or names of friends, which caused distress. She shared that she felt confused about where she was going when she was driving and had to turn back to her home. Anitha expressed that she has now tried 5 different cholesterol medications and realizes that because none of them have worked, she will have to do better with healthy eating and exercise. She decided that her plan for exercise is to walk in the mornings and try to walk at different city parks to keep it interesting. She also committed to checking out the free exercise classes at [place].   Suicidal/Homicidal: Nowithout intent/plan  Therapist Response: LCSW utilized supportive counseling techniques throughout the session in order to validate emotions and encourage open expression of emotion. LCSW checked in regarding medication compliance and health issues. LCSW and Cherysh processed about how she can address her goals for healthy eating and exercise.  Plan: Return again in 2 weeks.    Nilda Simmer, LCSW 03/06/2017

## 2017-03-12 ENCOUNTER — Telehealth: Payer: Self-pay | Admitting: Pharmacist

## 2017-03-12 NOTE — Telephone Encounter (Signed)
Pt called to report adverse effects with Praluent. She did her first injection on April 13th. She states she felt horrible for the subsequent 2 weeks. She reported overall loss of energy, felt "bad," had a "very low" BP to 118/64, flu-like symptoms, weakness, felt cold, and affected cognitive state so that she forgot tasks. Unlikely that some of these side effects are due to Praluent, but weakness and flu like reactions have been reported. Pt is not willing to rechallenge with another injection. Given previous intolerances to multiple statins, options are limited.  [facility] and pt is willing to pursue this. Will route her info to our research nurses to see if pt qualifies for CLEAR trial. Will route to Dr Delton SeeNelson as an Lorain ChildesFYI as well.

## 2017-03-12 NOTE — Telephone Encounter (Signed)
Thank you for letting me know, that's unfortunate, CLEAR trial is a good idea!

## 2017-03-20 ENCOUNTER — Ambulatory Visit: Payer: Self-pay | Admitting: Licensed Clinical Social Worker

## 2017-03-20 DIAGNOSIS — F3341 Major depressive disorder, recurrent, in partial remission: Secondary | ICD-10-CM | POA: Diagnosis not present

## 2017-03-20 DIAGNOSIS — F418 Other specified anxiety disorders: Secondary | ICD-10-CM | POA: Diagnosis not present

## 2017-03-22 NOTE — Progress Notes (Signed)
   THERAPY PROGRESS NOTE  Session Time: 30min  Participation Level: Active  Behavioral Response: CasualAlertEuthymic  Type of Therapy: Individual Therapy  Treatment Goals addressed: Coping  Interventions: Strength-based and Supportive  Summary: Haley LyonsCynthia L [name] is a 61 y.o. female who presents with a positive mood and appropriate affect. She reported that she has been feeling very good overall lately. She shared that her stress level is down because she continues to set healthy boundaries with family and friends. She described how a recent crisis for her son did not become a crisis for herself. Haley [name] stated that this ability to "stay out of things" and have good boundaries is the biggest sign that she is ready to stop counseling. She expressed sadness but also pride in herself for how far she has come in counseling. She reported that she is currently walking every other day and feels very motivated to keep it up. She shared that she can already tell a difference in her breathing, stamina, and energy level since beginning walking two weeks ago.   Suicidal/Homicidal: Nowithout intent/plan  Therapist Response: LCSW utilized supportive counseling techniques throughout the session in order to validate emotions and encourage open expression of emotion. LCSW reflected on the hard work that Haley [name] has put into managing her depression, addressing the trauma from her past, and setting healthier boundaries. LCSW and Haley [name] processed about how she can keep up her motivation to continue exercising.  Plan: Return again in 2 weeks, for final session.     Haley Simmeratosha Ashley Montminy, LCSW 03/22/2017

## 2017-04-03 ENCOUNTER — Other Ambulatory Visit: Payer: BLUE CROSS/BLUE SHIELD

## 2017-04-06 ENCOUNTER — Ambulatory Visit: Payer: BLUE CROSS/BLUE SHIELD

## 2017-04-06 ENCOUNTER — Encounter: Payer: Self-pay | Admitting: Internal Medicine

## 2017-04-06 ENCOUNTER — Ambulatory Visit (INDEPENDENT_AMBULATORY_CARE_PROVIDER_SITE_OTHER): Payer: Self-pay | Admitting: Internal Medicine

## 2017-04-06 VITALS — BP 132/88 | HR 82 | Resp 12 | Ht 61.25 in | Wt 230.0 lb

## 2017-04-06 DIAGNOSIS — E119 Type 2 diabetes mellitus without complications: Secondary | ICD-10-CM | POA: Diagnosis not present

## 2017-04-06 DIAGNOSIS — M67431 Ganglion, right wrist: Secondary | ICD-10-CM

## 2017-04-06 DIAGNOSIS — I10 Essential (primary) hypertension: Secondary | ICD-10-CM

## 2017-04-06 DIAGNOSIS — F329 Major depressive disorder, single episode, unspecified: Secondary | ICD-10-CM

## 2017-04-06 DIAGNOSIS — E782 Mixed hyperlipidemia: Secondary | ICD-10-CM

## 2017-04-06 DIAGNOSIS — F32A Depression, unspecified: Secondary | ICD-10-CM

## 2017-04-06 DIAGNOSIS — E669 Obesity, unspecified: Secondary | ICD-10-CM

## 2017-04-06 LAB — GLUCOSE, POCT (MANUAL RESULT ENTRY): POC GLUCOSE: 97 mg/dL (ref 70–99)

## 2017-04-06 NOTE — Progress Notes (Signed)
Subjective:    Patient ID: Haley [name], female    DOB: Sep 08, 1956, 61 y.o.   MRN: [id number]  HPI   1.  Essential Hypertension:  Getting bps of 128/70 at home.  Is back on Lisinopril after coming off [facility] for Benicar.  2.  Ganglion cyst, right wrist:  Not enlarging and not bothering her as of yet.  3.  Hyperlipidemia:  Did not tolerate Praluent.  Has not tolerated any statin.  Dances in the house during inclement weather.  During nice days, walks in the morning--2 miles.  Everyday.  Does not want to try another statin or related cholesterol lowering med.  4.  Depression:  Has worked for some time with Samul DadaN. Knight, LCSW and feels this has been very beneficial to her.  5.  DM:  Not checking sugars.  Has been well controlled.  Due for A1C.   Immunizations up to date including flu, pneumococcal. Current Meds  Medication Sig  . amLODipine (NORVASC) 5 MG tablet Take 1 tablet (5 mg total) by mouth daily.  Marland Kitchen. aspirin 81 MG tablet Take 81 mg by mouth daily.  . cetirizine (ZYRTEC) 10 MG tablet Take 1 tablet (10 mg total) by mouth daily.  . Cholecalciferol (VITAMIN D) 2000 units CAPS Take by mouth. 1 daily  . Flax Oil-Fish Oil-Borage Oil (FISH OIL-FLAX OIL-BORAGE OIL PO) Take 2 capsules by mouth daily. Reported on 12/28/2015  . GARLIC PO Take 119600 mg by mouth. 2 daily  . Ginger Root POWD 1 scoop by Does not apply route daily. Reported on 05/11/2016 (1/3 of a teaspoon) with a cup of coffee each day - about 2 cups of coffee daily  . glimepiride (AMARYL) 4 MG tablet TAKE ONE TABLET BY MOUTH ONCE DAILY WITH BREAKFAST  . lisinopril (PRINIVIL,ZESTRIL) 40 MG tablet Take 1 tablet (40 mg total) by mouth daily.  . Multiple Vitamins-Minerals (MULTIVITAMIN WITH MINERALS) tablet Take 1 tablet by mouth daily.  . Turmeric Curcumin 500 MG CAPS Take 1 capsule by mouth daily. Reported on 05/11/2016    Allergies  Allergen Reactions  . Praluent [Alirocumab]     Had significant fatigue and memory loss--was lost  driving in town.  Joint pain and nausea as well.  Marland Kitchen. Zetia [Ezetimibe] Other (See Comments)    Joint pain and ear ringing  . Atorvastatin Other (See Comments)    Pt states causes bilateral lower extremity muscle cramps  . Pravastatin Nausea And Vomiting and Other (See Comments)    Causes dizziness      Review of Systems     Objective:   Physical Exam NAD HEENT:  PERRL, EOMI,  Neck:  Supple, No adenopathy Chest:  CTA CV:  RRR with normal S1 and S2, no S3, S4 or murmur, radial and DP pulses normal and equal Abd:  S, NT, No HSM or mass, + BS LE:  No edema       Assessment & Plan:  1.  Essential Hypertension:  Controlled following change to Lisinopril from Benicar secondary to cost.  2.  DM:  Check A1C.  Has been well controlled with Glimepiride, lifestyle.  To continue to work on the latter. Encouraged scheduling ophthalmology exam as due.  3.  Depression:  Improved with counseling.  4.  Joint complaints:  Encouraged switching to tart cherry from [facility] and stopping Turmeric due to liver concerns with the latter.  5.  Hyperlipidemia:  Has not tolerated cholesterol lowering agents.  Encouraged speaking to [facility] at [facility]arding options there.  Follow up in 3 months for CPE without pap.  She is s/p TAH for benign reasons

## 2017-04-06 NOTE — Patient Instructions (Addendum)
Check with [facility] with regards to supplements to treat high LDL cholesterol.  Go get the Almyra Freeart Cherry instead of taking Turmeric  [facility] [address]  [place] [address] [phone] 40% off

## 2017-04-07 LAB — HGB A1C W/O EAG: Hgb A1c MFr Bld: 6.3 % — ABNORMAL HIGH (ref 4.8–5.6)

## 2017-05-09 ENCOUNTER — Encounter: Payer: Self-pay | Admitting: Internal Medicine

## 2017-05-09 DIAGNOSIS — M67431 Ganglion, right wrist: Secondary | ICD-10-CM

## 2017-05-09 HISTORY — DX: Ganglion, right wrist: M67.431

## 2017-07-09 ENCOUNTER — Ambulatory Visit: Payer: Self-pay | Admitting: Internal Medicine

## 2017-07-10 ENCOUNTER — Ambulatory Visit (INDEPENDENT_AMBULATORY_CARE_PROVIDER_SITE_OTHER): Payer: Self-pay | Admitting: Internal Medicine

## 2017-07-10 ENCOUNTER — Encounter: Payer: Self-pay | Admitting: Internal Medicine

## 2017-07-10 VITALS — BP 150/88 | HR 80 | Resp 12 | Ht 61.25 in | Wt 231.0 lb

## 2017-07-10 DIAGNOSIS — H9192 Unspecified hearing loss, left ear: Secondary | ICD-10-CM | POA: Diagnosis not present

## 2017-07-10 DIAGNOSIS — Z0000 Encounter for general adult medical examination without abnormal findings: Secondary | ICD-10-CM

## 2017-07-10 DIAGNOSIS — E119 Type 2 diabetes mellitus without complications: Secondary | ICD-10-CM | POA: Diagnosis not present

## 2017-07-10 DIAGNOSIS — K635 Polyp of colon: Secondary | ICD-10-CM | POA: Diagnosis not present

## 2017-07-10 LAB — GLUCOSE, POCT (MANUAL RESULT ENTRY): POC GLUCOSE: 113 mg/dL — AB (ref 70–99)

## 2017-07-10 NOTE — Patient Instructions (Signed)
Can google "advance directives, [place]"  And bring up form from Secretary of State. Print and fill out Or can go to "5 wishes"  Which is also in Spanish and fill out--this costs $5--perhaps easier to use. Designate a Medical Power of Attorney to speak for you if you are unable to speak for yourself when ill or injured  

## 2017-07-10 NOTE — Progress Notes (Signed)
Subjective:    Patient ID: Haley [name], female    DOB: 1956/10/15, 61 y.o.   MRN: [id number]  HPI  CPE with pap  1.  Pap:  Laparoscopic TAH without BSO.  Fibroids and prolapse.  No history of abnormal pap.  No family history of cervical cancer.  2.  Mammogram:  Last mammogram 05/11/2016.  Stable asymmetries.  No family history of breast cancer.  3.  Osteoprevention:  Yogurt 3-4 times weekly.  Rare milk intake.  Cheese several times daily.  Stopped walking with the rain.  Running up and down stairs daily--up to 6 reps.  Working up to 10.  Takes 1 minute only.    4.  Guaiac Cards:  Negative 07/2016.  No family history of colon cancer.  5.  Colonoscopy:  History of polyps in 2005 colonoscopy.  Appears we received old records, but unable to bring up documentation.  6.  Immunizations: Immunization History  Administered Date(s) Administered  . Influenza, Quadrivalent, Recombinant, Inj, Pf 08/01/2016  . Influenza,inj,Quad PF,6+ Mos 12/28/2015  . Pneumococcal Polysaccharide-23 08/13/1996, 07/22/2013  . Td 11/12/1996  . Tdap 04/18/2016   Will get influenza vaccine later in month.  7.  Glucose/Cholesterol:  A1C has been running 6.3%.  Has not tolerated cholesterol lowering medications.  Has not been at goal.   Current Meds  Medication Sig  . amLODipine (NORVASC) 5 MG tablet Take 1 tablet (5 mg total) by mouth daily.  Marland Kitchen aspirin 81 MG tablet Take 81 mg by mouth daily.  . cetirizine (ZYRTEC) 10 MG tablet Take 1 tablet (10 mg total) by mouth daily.  . Cholecalciferol (VITAMIN D) 2000 units CAPS Take by mouth. 1 daily  . Flax Oil-Fish Oil-Borage Oil (FISH OIL-FLAX OIL-BORAGE OIL PO) Take 2 capsules by mouth daily. Reported on 12/28/2015  . GARLIC PO Take 952 mg by mouth. 2 daily  . glimepiride (AMARYL) 4 MG tablet TAKE ONE TABLET BY MOUTH ONCE DAILY WITH BREAKFAST  . lisinopril (PRINIVIL,ZESTRIL) 40 MG tablet Take 1 tablet (40 mg total) by mouth daily.  . Multiple Vitamins-Minerals  (MULTIVITAMIN WITH MINERALS) tablet Take 1 tablet by mouth daily.    Allergies  Allergen Reactions  . Praluent [Alirocumab]     Had significant fatigue and memory loss--was lost driving in town.  Joint pain and nausea as well.  Marland Kitchen Zetia [Ezetimibe] Other (See Comments)    Joint pain and ear ringing  . Atorvastatin Other (See Comments)    Pt states causes bilateral lower extremity muscle cramps  . Pravastatin Nausea And Vomiting and Other (See Comments)    Causes dizziness   Past Medical History:  Diagnosis Date  . Diabetes mellitus without complication (HCC)   . Ganglion cyst of wrist, right 05/09/2017  . Hyperlipidemia   . Hypertension   . Shingles     Past Surgical History:  Procedure Laterality Date  . ABDOMINAL HYSTERECTOMY  1994   Laparoscopic  . CARDIAC CATHETERIZATION N/A 10/23/2016   Procedure: Left Heart Cath and Coronary Angiography;  Surgeon: Kathleene Hazel, MD;  Location: [facility] INVASIVE CV LAB;  Service: Cardiovascular;  Laterality: N/A;  . CHOLECYSTECTOMY  1997   Laparoscopic  . WISDOM TOOTH EXTRACTION      Family History  Problem Relation Age of Onset  . Hypertension Father   . Stroke Maternal Aunt        Multiple aunts died from strokes-maternal  . Hypertension Son   . Diabetes Son   . Hypertension Brother   .  Diabetes Brother   . Diabetes Sister        prediabetic  . Seizures Brother     Social History   Social History  . Marital status: Single    Spouse name: N/A  . Number of children: 2  . Years of education: 12+   Occupational History  . Print production planner     Retired now   Social History Main Topics  . Smoking status: Never Smoker  . Smokeless tobacco: Never Used  . Alcohol use 0.0 oz/week     Comment: occasionally  . Drug use: No  . Sexual activity: No   Other Topics Concern  . Not on file   Social History Narrative   Did get some post high school training/education   Lives alone    Review of Systems  HENT: Positive for  hearing loss (left hearing loss--no history of audiology evaluation).   Eyes: Positive for visual disturbance (needs eye check.  ).  Respiratory: Negative for cough and shortness of breath.   Cardiovascular: Positive for palpitations (occasionally.  No light headedness). Negative for chest pain and leg swelling.  Gastrointestinal: Negative for abdominal pain, blood in stool (no melena), constipation and diarrhea.  Genitourinary: Negative for decreased urine volume, dysuria and vaginal discharge.  Musculoskeletal: Positive for arthralgias (mild at times).  Skin: Negative for rash.  Neurological: Negative for weakness, light-headedness and numbness.  Hematological: Does not bruise/bleed easily.  Psychiatric/Behavioral: Negative for dysphoric mood. The patient is not nervous/anxious.        Objective:   Physical Exam  Constitutional: She is oriented to person, place, and time.  obese  HENT:  Head: Normocephalic and atraumatic.  Right Ear: Tympanic membrane, external ear and ear canal normal.  Left Ear: Tympanic membrane, external ear and ear canal normal.  Nose: Nose normal.  Mouth/Throat: Uvula is midline, oropharynx is clear and moist and mucous membranes are normal.  Eyes: Pupils are equal, round, and reactive to light. Conjunctivae and EOM are normal.  Discs sharp bilaterally  Neck: Normal range of motion and full passive range of motion without pain. Neck supple. No thyromegaly present.  Cardiovascular: Normal rate, regular rhythm, S1 normal and S2 normal.  Exam reveals no S3, no S4 and no friction rub.   No murmur heard. No carotid bruits.  Carotid, radial, femoral, DP and PT pulses normal and equal.   Pulmonary/Chest: Effort normal and breath sounds normal. Right breast exhibits no inverted nipple, no mass, no nipple discharge, no skin change and no tenderness. Left breast exhibits no inverted nipple, no mass, no nipple discharge, no skin change and no tenderness.  Abdominal: Soft.  Bowel sounds are normal. She exhibits no mass. There is no tenderness.  Genitourinary: Rectum normal. Rectal exam shows no mass, anal tone normal and guaiac negative stool.  Genitourinary Comments: No adnexal mass or tenderness.  Musculoskeletal: Normal range of motion.  Lymphadenopathy:       Head (right side): No submental and no submandibular adenopathy present.       Head (left side): No submental and no submandibular adenopathy present.    She has no cervical adenopathy.    She has no axillary adenopathy.       Right: No inguinal and no supraclavicular adenopathy present.       Left: No inguinal and no supraclavicular adenopathy present.  Neurological: She is alert and oriented to person, place, and time. She has normal strength and normal reflexes. She displays normal reflexes. No cranial nerve  deficit or sensory deficit. Coordination and gait normal.  Skin: Skin is warm and dry. No rash noted.  Psychiatric: She has a normal mood and affect. Her speech is normal and behavior is normal. Judgment and thought content normal. Cognition and memory are normal.          Assessment & Plan:  1.  CPE without pap:  History of Laparoscopic hysterectomy. Will obtain flu at one of our flu clinics or on her own.   Guaiac Cards x 3 to return in 2 weeks when returns for fasting labs: FLP, urin microalbumin/crea, CBC, CMP, HIV, Hep C Checking into colonoscopy.  Will likely need to schedule again as more than 10 years since last done. Follow up with me in 4 months.  2.  Essential Hypertension:  Stressed as had to take 3 different buses to get here today.  3.  Decreased hearing with tinnitus:  Audiology referral.  4.  DM:  Needs eye exam.  Referral to Dr. Laruth BouchardGroat's office.

## 2017-07-25 ENCOUNTER — Other Ambulatory Visit: Payer: Self-pay | Admitting: Internal Medicine

## 2017-07-25 DIAGNOSIS — Z1231 Encounter for screening mammogram for malignant neoplasm of breast: Secondary | ICD-10-CM

## 2017-07-26 ENCOUNTER — Other Ambulatory Visit (INDEPENDENT_AMBULATORY_CARE_PROVIDER_SITE_OTHER): Payer: BLUE CROSS/BLUE SHIELD

## 2017-07-26 DIAGNOSIS — E119 Type 2 diabetes mellitus without complications: Secondary | ICD-10-CM

## 2017-07-26 DIAGNOSIS — Z1211 Encounter for screening for malignant neoplasm of colon: Secondary | ICD-10-CM

## 2017-07-26 DIAGNOSIS — Z79899 Other long term (current) drug therapy: Secondary | ICD-10-CM | POA: Diagnosis not present

## 2017-07-26 DIAGNOSIS — E782 Mixed hyperlipidemia: Secondary | ICD-10-CM

## 2017-07-26 DIAGNOSIS — Z7251 High risk heterosexual behavior: Secondary | ICD-10-CM

## 2017-07-27 LAB — POC HEMOCCULT BLD/STL (HOME/3-CARD/SCREEN)
Card #2 Fecal Occult Blod, POC: NEGATIVE
Card #3 Fecal Occult Blood, POC: NEGATIVE
Fecal Occult Blood, POC: NEGATIVE

## 2017-07-28 LAB — CBC WITH DIFFERENTIAL/PLATELET
BASOS: 1 %
Basophils Absolute: 0.1 10*3/uL (ref 0.0–0.2)
EOS (ABSOLUTE): 0.2 10*3/uL (ref 0.0–0.4)
Eos: 3 %
Hematocrit: 41 % (ref 34.0–46.6)
Hemoglobin: 13.7 g/dL (ref 11.1–15.9)
IMMATURE GRANULOCYTES: 0 %
Immature Grans (Abs): 0 10*3/uL (ref 0.0–0.1)
Lymphocytes Absolute: 2.2 10*3/uL (ref 0.7–3.1)
Lymphs: 39 %
MCH: 31.1 pg (ref 26.6–33.0)
MCHC: 33.4 g/dL (ref 31.5–35.7)
MCV: 93 fL (ref 79–97)
MONOS ABS: 0.6 10*3/uL (ref 0.1–0.9)
Monocytes: 11 %
NEUTROS PCT: 46 %
Neutrophils Absolute: 2.5 10*3/uL (ref 1.4–7.0)
PLATELETS: 283 10*3/uL (ref 150–379)
RBC: 4.41 x10E6/uL (ref 3.77–5.28)
RDW: 13.5 % (ref 12.3–15.4)
WBC: 5.6 10*3/uL (ref 3.4–10.8)

## 2017-07-28 LAB — COMPREHENSIVE METABOLIC PANEL
A/G RATIO: 1.6 (ref 1.2–2.2)
ALK PHOS: 61 IU/L (ref 39–117)
ALT: 39 IU/L — AB (ref 0–32)
AST: 28 IU/L (ref 0–40)
Albumin: 4.2 g/dL (ref 3.6–4.8)
BILIRUBIN TOTAL: 0.3 mg/dL (ref 0.0–1.2)
BUN/Creatinine Ratio: 11 — ABNORMAL LOW (ref 12–28)
BUN: 12 mg/dL (ref 8–27)
CHLORIDE: 104 mmol/L (ref 96–106)
CO2: 21 mmol/L (ref 20–29)
Calcium: 9.7 mg/dL (ref 8.7–10.3)
Creatinine, Ser: 1.12 mg/dL — ABNORMAL HIGH (ref 0.57–1.00)
GFR calc Af Amer: 60 mL/min/{1.73_m2} (ref >59)
GFR calc non Af Amer: 52 mL/min/{1.73_m2} — ABNORMAL LOW (ref >59)
Globulin, Total: 2.7 g/dL (ref 1.5–4.5)
Glucose: 86 mg/dL (ref 65–99)
POTASSIUM: 4.1 mmol/L (ref 3.5–5.2)
Sodium: 140 mmol/L (ref 134–144)
Total Protein: 6.9 g/dL (ref 6.0–8.5)

## 2017-07-28 LAB — LIPID PANEL W/O CHOL/HDL RATIO
Cholesterol, Total: 204 mg/dL — ABNORMAL HIGH (ref 100–199)
HDL: 47 mg/dL (ref >39)
LDL Calculated: 138 mg/dL — ABNORMAL HIGH (ref 0–99)
Triglycerides: 93 mg/dL (ref 0–149)
VLDL Cholesterol Cal: 19 mg/dL (ref 5–40)

## 2017-07-28 LAB — HEPATITIS C ANTIBODY

## 2017-07-28 LAB — MICROALBUMIN / CREATININE URINE RATIO
CREATININE, UR: 28.9 mg/dL
Microalb/Creat Ratio: <10.4 mg/g creat (ref 0.0–30.0)

## 2017-07-28 LAB — HIV ANTIBODY (ROUTINE TESTING W REFLEX): HIV SCREEN 4TH GENERATION: NONREACTIVE

## 2017-08-06 ENCOUNTER — Ambulatory Visit
Admission: RE | Admit: 2017-08-06 | Discharge: 2017-08-06 | Disposition: A | Discharge status: Home / Self Care | Payer: BLUE CROSS/BLUE SHIELD | Source: Ambulatory Visit | Attending: Internal Medicine | Admitting: Internal Medicine

## 2017-08-06 DIAGNOSIS — Z1231 Encounter for screening mammogram for malignant neoplasm of breast: Secondary | ICD-10-CM

## 2017-08-20 MED ORDER — ROSUVASTATIN CALCIUM 10 MG PO TABS: 10.0000 mg | ORAL_TABLET | Freq: Every day | ORAL | 11 refills | Status: DC

## 2017-08-20 NOTE — Addendum Note (Signed)
Addended by: Marcene Duos on: 08/20/2017 03:27 PM   Modules accepted: Orders

## 2017-08-23 ENCOUNTER — Other Ambulatory Visit: Payer: BLUE CROSS/BLUE SHIELD

## 2017-09-02 ENCOUNTER — Encounter: Payer: Self-pay | Admitting: Internal Medicine

## 2017-09-03 ENCOUNTER — Encounter: Payer: Self-pay | Admitting: Gastroenterology

## 2017-09-03 NOTE — Progress Notes (Signed)
Dr. Dione BoozeGroat appointment was scheduled for 08/14/17 @ 8:30 am and audiology has tried to contact patient to set up appointment.

## 2017-09-03 NOTE — Progress Notes (Signed)
Patient is scheduled for 10/26/17 with gastro

## 2017-10-02 ENCOUNTER — Other Ambulatory Visit: Payer: BLUE CROSS/BLUE SHIELD

## 2017-10-09 ENCOUNTER — Other Ambulatory Visit: Payer: Self-pay

## 2017-10-12 ENCOUNTER — Other Ambulatory Visit: Payer: Self-pay

## 2017-10-12 ENCOUNTER — Other Ambulatory Visit (INDEPENDENT_AMBULATORY_CARE_PROVIDER_SITE_OTHER): Payer: BLUE CROSS/BLUE SHIELD

## 2017-10-12 ENCOUNTER — Ambulatory Visit (AMBULATORY_SURGERY_CENTER): Payer: Self-pay | Admitting: *Deleted

## 2017-10-12 VITALS — Ht 61.0 in | Wt 234.0 lb

## 2017-10-12 DIAGNOSIS — Z1211 Encounter for screening for malignant neoplasm of colon: Secondary | ICD-10-CM

## 2017-10-12 DIAGNOSIS — Z79899 Other long term (current) drug therapy: Secondary | ICD-10-CM

## 2017-10-12 DIAGNOSIS — E782 Mixed hyperlipidemia: Secondary | ICD-10-CM | POA: Diagnosis not present

## 2017-10-12 MED ORDER — PEG-KCL-NACL-NASULF-NA ASC-C 140 G PO SOLR: 1.0000 | Freq: Once | ORAL | 0 refills | Status: AC

## 2017-10-12 NOTE — Progress Notes (Signed)
Patient denies any allergies to eggs or soy. Patient denies any problems with anesthesia/sedation. Patient denies any oxygen use at home. Patient denies taking any diet/weight loss medications or blood thinners. EMMI education assisgned to patient on colonoscopy, this was explained and instructions given to patient. 

## 2017-10-13 LAB — LIPID PANEL W/O CHOL/HDL RATIO
CHOLESTEROL TOTAL: 136 mg/dL (ref 100–199)
HDL: 48 mg/dL (ref >39)
LDL CALC: 72 mg/dL (ref 0–99)
Triglycerides: 79 mg/dL (ref 0–149)
VLDL CHOLESTEROL CAL: 16 mg/dL (ref 5–40)

## 2017-10-13 LAB — HEPATIC FUNCTION PANEL
ALT: 45 IU/L — ABNORMAL HIGH (ref 0–32)
AST: 28 IU/L (ref 0–40)
Albumin: 4.3 g/dL (ref 3.6–4.8)
Alkaline Phosphatase: 60 IU/L (ref 39–117)
Bilirubin Total: 0.4 mg/dL (ref 0.0–1.2)
Bilirubin, Direct: 0.12 mg/dL (ref 0.00–0.40)
Total Protein: 6.9 g/dL (ref 6.0–8.5)

## 2017-10-15 ENCOUNTER — Encounter: Payer: Self-pay | Admitting: Gastroenterology

## 2017-10-23 ENCOUNTER — Telehealth: Payer: Self-pay | Admitting: Gastroenterology

## 2017-10-23 NOTE — Telephone Encounter (Signed)
Called patient. She states she is unable to spend $50 for Plenvu prep w/using coupon that she was given in PV. Miralax instructions sent to pt's MyChart. Pt verbalizes understanding. encouraged patient to call us back if needed.

## 2017-10-26 ENCOUNTER — Other Ambulatory Visit: Payer: Self-pay

## 2017-10-26 ENCOUNTER — Encounter: Payer: Self-pay | Admitting: Gastroenterology

## 2017-10-26 ENCOUNTER — Ambulatory Visit (AMBULATORY_SURGERY_CENTER): Payer: BLUE CROSS/BLUE SHIELD | Admitting: Gastroenterology

## 2017-10-26 VITALS — BP 111/66 | HR 61 | Temp 98.0°F | Resp 16 | Ht 61.0 in | Wt 234.0 lb

## 2017-10-26 DIAGNOSIS — Z1211 Encounter for screening for malignant neoplasm of colon: Secondary | ICD-10-CM | POA: Diagnosis not present

## 2017-10-26 DIAGNOSIS — Z1212 Encounter for screening for malignant neoplasm of rectum: Secondary | ICD-10-CM | POA: Diagnosis not present

## 2017-10-26 MED ORDER — SODIUM CHLORIDE 0.9 % IV SOLN: 500.0000 mL | INTRAVENOUS | Status: DC

## 2017-10-26 NOTE — Patient Instructions (Signed)

## 2017-10-26 NOTE — Op Note (Signed)
[facility] Patient Name: Haley [name] Procedure Date: 10/26/2017 10:20 AM MRN: [id number] Endoscopist: Napoleon FormKavitha V. Nandigam , MD Age: [age] Referring MD:  Date of Birth: 1956/03/22 Gender: Female Account #: [id number] Procedure:                Colonoscopy Indications:              Screening for colorectal malignant neoplasm, Last                            colonoscopy: 2004 Medicines:                Monitored Anesthesia Care Procedure:                Pre-Anesthesia Assessment:                           - Prior to the procedure, a History and Physical                            was performed, and patient medications and                            allergies were reviewed. The patient's tolerance of                            previous anesthesia was also reviewed. The risks                            and benefits of the procedure and the sedation                            options and risks were discussed with the patient.                            All questions were answered, and informed consent                            was obtained. Prior Anticoagulants: The patient has                            taken no previous anticoagulant or antiplatelet                            agents. ASA Grade Assessment: III - A patient with                            severe systemic disease. After reviewing the risks                            and benefits, the patient was deemed in                            satisfactory condition to undergo the procedure.  After obtaining informed consent, the colonoscope                            was passed under direct vision. Throughout the                            procedure, the patient's blood pressure, pulse, and                            oxygen saturations were monitored continuously. The                            Model PCF-H190DL [id number]) scope was introduced                            through the anus and advanced  to the the sigmoid                            colon for evaluation. This was the intended extent.                            The colonoscopy was technically difficult and                            complex due to multiple diverticula in the colon                            and poor bowel prep with stool present. Successful                            completion of the procedure was aided by lavage.                            The patient tolerated the procedure well. The                            quality of the bowel preparation was poor. The                            rectum was photographed. Scope In: 10:24:57 AM Scope Out: 10:29:29 AM Total Procedure Duration: 0 hours 4 minutes 32 seconds  Findings:                 The perianal and digital rectal examinations were                            normal.                           Multiple large-mouthed diverticula were found in                            the sigmoid colon. There was evidence of  diverticular spasm. Peri-diverticular erythema was                            seen. There was evidence of an impacted                            diverticulum. Poor preparation with inadequate                            visualization, was technically difficult to                            navigate. Complications:            No immediate complications. Estimated Blood Loss:     Estimated blood loss: none. Impression:               - Preparation of the colon was poor.                           - Severe diverticulosis in the sigmoid colon. There                            was evidence of diverticular spasm.                            Peri-diverticular erythema was seen. There was                            evidence of an impacted diverticulum.                           - No specimens collected. Recommendation:           - Patient has a contact number available for                            emergencies. The signs and symptoms  of potential                            delayed complications were discussed with the                            patient. Return to normal activities tomorrow.                            Written discharge instructions were provided to the                            patient.                           - Resume previous diet.                           - Continue present medications.                           -  Repeat colonoscopy at the next available                            appointment because the bowel preparation was poor.                           - For future colonoscopy the patient will require                            an extended preparation. If there are any                            questions, please contact the gastroenterologist. Napoleon Form, MD 10/26/2017 10:34:58 AM This report has been signed electronically.

## 2017-10-26 NOTE — Progress Notes (Signed)
Report to PACU, RN, vss, BBS= Clear.  

## 2017-10-29 ENCOUNTER — Telehealth: Payer: Self-pay | Admitting: *Deleted

## 2017-10-29 NOTE — Telephone Encounter (Signed)
  Follow up Call-  Call back number 10/26/2017  Post procedure Call Back phone  # [phone]  Permission to leave phone message Yes  Some recent data might be hidden     No answer at # given.  LM on VM.  

## 2017-10-29 NOTE — Telephone Encounter (Signed)
  Follow up Call-  Call back number 10/26/2017  Post procedure Call Back phone  # [phone]  Permission to leave phone message Yes  Some recent data might be hidden     No answer at # given.  LM on VM.

## 2017-11-07 ENCOUNTER — Ambulatory Visit (INDEPENDENT_AMBULATORY_CARE_PROVIDER_SITE_OTHER): Payer: BLUE CROSS/BLUE SHIELD | Admitting: Internal Medicine

## 2017-11-07 ENCOUNTER — Encounter: Payer: Self-pay | Admitting: Internal Medicine

## 2017-11-07 VITALS — BP 160/100 | HR 88 | Resp 12 | Ht 61.25 in | Wt 228.0 lb

## 2017-11-07 DIAGNOSIS — E7800 Pure hypercholesterolemia, unspecified: Secondary | ICD-10-CM

## 2017-11-07 DIAGNOSIS — E119 Type 2 diabetes mellitus without complications: Secondary | ICD-10-CM | POA: Diagnosis not present

## 2017-11-07 DIAGNOSIS — I10 Essential (primary) hypertension: Secondary | ICD-10-CM | POA: Diagnosis not present

## 2017-11-07 NOTE — Progress Notes (Signed)
Subjective:    Patient ID: Haley [name], female    DOB: [birth year], 62 y.o.   MRN: [id number]  HPI   1.  Essential Hypertension:  Out of medication for 1 week waiting for check to come.    2.  DM:  Sugars have been a bit higher since out of Glimepiride.  Last check this morning was 141.  Otherwise, her sugars are in 120-130 range.  Ate cookies last night as well.  Oreos of all things.  Has lost 6 lbs since last visit as working on diet and physical activity.  3.  Hypercholesterolemia:  LDL almost at goal early December at 72, rest of panel at goal however on Crestor and using Flaxseed .  ALT mildly elevated at 45. She is scheduled to have recheck for hepatic profile in 2 months.    4.  Colonoscopy:  Did not get a good result with prep.  Has to do this over 2 days now and return for repeat colonoscopy she believes on Jan 12.  5.  Elevated ALT:  Is taking Turmeric still.  Did not get Almyra Freeart Cherry as discussed back in September, which could be causing elevated ALT.  Current Meds  Medication Sig  . amLODipine (NORVASC) 5 MG tablet Take 1 tablet (5 mg total) by mouth daily.  Marland Kitchen. aspirin 81 MG tablet Take 81 mg by mouth daily.  . cetirizine (ZYRTEC) 10 MG tablet Take 1 tablet (10 mg total) by mouth daily.  . Cholecalciferol (VITAMIN D) 2000 units CAPS Take by mouth. 1 daily  . Flax Oil-Fish Oil-Borage Oil (FISH OIL-FLAX OIL-BORAGE OIL PO) Take 2 capsules by mouth daily. Reported on 12/28/2015  . GARLIC PO Take 409600 mg by mouth. 2 daily  . glimepiride (AMARYL) 4 MG tablet TAKE ONE TABLET BY MOUTH ONCE DAILY WITH BREAKFAST  . lisinopril (PRINIVIL,ZESTRIL) 40 MG tablet Take 1 tablet (40 mg total) by mouth daily.  . mometasone (NASONEX) 50 MCG/ACT nasal spray 2 sprays each nostril daily  . Multiple Vitamins-Minerals (MULTIVITAMIN WITH MINERALS) tablet Take 1 tablet by mouth daily.  . rosuvastatin (CRESTOR) 10 MG tablet Take 1 tablet (10 mg total) by mouth daily.  . Turmeric Curcumin 500 MG CAPS  Take 1 capsule by mouth daily. Reported on 05/11/2016  . vitamin C (ASCORBIC ACID) 500 MG tablet Take 500 mg by mouth daily.    Allergies  Allergen Reactions  . Praluent [Alirocumab]     Had significant fatigue and memory loss--was lost driving in town.  Joint pain and nausea as well.  Marland Kitchen. Zetia [Ezetimibe] Other (See Comments)    Joint pain and ear ringing  . Atorvastatin Other (See Comments)    Pt states causes bilateral lower extremity muscle cramps  . Pravastatin Nausea And Vomiting and Other (See Comments)    Causes dizziness      Review of Systems     Objective:   Physical Exam NAD Lungs:  CTA CV:  RRR without murmur or rub, radial and DP pulses normal and equal.  No LE Edema      Assessment & Plan:  1.  Hypertension:  Discussed to let us know if a problem getting meds filled.  Encouraged not to ever run out.  Return in 1 month for bp and pulse check with nurse.  2.  DM:  Has been well controlled.  No A1C in past 6 months and concern for being off meds for 1.5 weeks:  A1C Has had eye check with Dr.  Groat..   3.  Elevated ALT:  Again encouraged stopping Turmeric and moving to [employer].  Has recheck in 2 months already scheduled  4.  Decreased hearing/tinnitus:  Checking on audiology referral.  Follow up in 4 months with me to see if ongoing lifestyle changes

## 2017-11-08 LAB — HGB A1C W/O EAG: HEMOGLOBIN A1C: 6.3 % — AB (ref 4.8–5.6)

## 2017-11-13 ENCOUNTER — Telehealth: Payer: Self-pay

## 2017-11-13 NOTE — Telephone Encounter (Signed)
Patient No Showed for Pre-Visit today. A message was left on her voicemail to call and reschedule PV before 5:00 Pm today or we will need to cancel her colonoscopy that is scheduled for 12/07/17 per [facility] guidelines. If we do not hear from the patient by 5:00 Pm a no show letter will be mailed.   Haley DaneNancy Scot Shiraishi, LPN  ( PV )

## 2017-12-07 ENCOUNTER — Ambulatory Visit (INDEPENDENT_AMBULATORY_CARE_PROVIDER_SITE_OTHER): Payer: BLUE CROSS/BLUE SHIELD

## 2017-12-07 ENCOUNTER — Encounter: Payer: BLUE CROSS/BLUE SHIELD | Admitting: Gastroenterology

## 2017-12-07 VITALS — BP 130/82 | HR 72

## 2017-12-07 DIAGNOSIS — I10 Essential (primary) hypertension: Secondary | ICD-10-CM | POA: Diagnosis not present

## 2017-12-07 NOTE — Progress Notes (Signed)
Patient BP in normal range. Informed to continue current dose of medication. patient verbalized understanding 

## 2018-01-10 ENCOUNTER — Other Ambulatory Visit: Payer: BLUE CROSS/BLUE SHIELD

## 2018-01-15 ENCOUNTER — Other Ambulatory Visit (INDEPENDENT_AMBULATORY_CARE_PROVIDER_SITE_OTHER): Payer: BLUE CROSS/BLUE SHIELD

## 2018-01-15 DIAGNOSIS — Z79899 Other long term (current) drug therapy: Secondary | ICD-10-CM | POA: Diagnosis not present

## 2018-01-16 LAB — HEPATIC FUNCTION PANEL
ALT: 29 IU/L (ref 0–32)
AST: 23 IU/L (ref 0–40)
Albumin: 4 g/dL (ref 3.6–4.8)
Alkaline Phosphatase: 60 IU/L (ref 39–117)
BILIRUBIN TOTAL: 0.4 mg/dL (ref 0.0–1.2)
Bilirubin, Direct: 0.11 mg/dL (ref 0.00–0.40)
TOTAL PROTEIN: 6.8 g/dL (ref 6.0–8.5)

## 2018-03-06 ENCOUNTER — Ambulatory Visit: Payer: BLUE CROSS/BLUE SHIELD | Admitting: Internal Medicine

## 2018-03-06 ENCOUNTER — Encounter: Payer: Self-pay | Admitting: Internal Medicine

## 2018-03-06 VITALS — BP 124/82 | HR 76 | Resp 12 | Ht 61.25 in | Wt 226.0 lb

## 2018-03-06 DIAGNOSIS — E119 Type 2 diabetes mellitus without complications: Secondary | ICD-10-CM | POA: Diagnosis not present

## 2018-03-06 DIAGNOSIS — M1990 Unspecified osteoarthritis, unspecified site: Secondary | ICD-10-CM

## 2018-03-06 DIAGNOSIS — K429 Umbilical hernia without obstruction or gangrene: Secondary | ICD-10-CM

## 2018-03-06 DIAGNOSIS — I10 Essential (primary) hypertension: Secondary | ICD-10-CM | POA: Diagnosis not present

## 2018-03-06 DIAGNOSIS — H9313 Tinnitus, bilateral: Secondary | ICD-10-CM

## 2018-03-06 DIAGNOSIS — H9192 Unspecified hearing loss, left ear: Secondary | ICD-10-CM

## 2018-03-06 DIAGNOSIS — E7800 Pure hypercholesterolemia, unspecified: Secondary | ICD-10-CM

## 2018-03-06 NOTE — Patient Instructions (Signed)
[facility] [address]  [place], [place] [address] [phone]  For 40% discount--needs Almyra Free for joint pain/inflammation.

## 2018-03-06 NOTE — Progress Notes (Signed)
Subjective:    Patient ID: Haley [name], female    DOB: [birth year], 62 y.o.   MRN: [id number]  HPI   1.  Essential Hypertension:  Controlled now not missing medication.   2.  DM:  Has had some low blood sugars any time of day in last 2 weeks.  Has not noted she is eating less or skipping a meal.  Can happen any time of day.  She feels "nervous"  Inside and feels faint.  Her sugars have dropped into 40-50 range.  Has had to take a sugar tab almost every day in past 2 weeks. She has tried to cut meat out of diet, however.  Does eat fish,eggs and [place] still.  Maybe not as much as previous.  Discussed other sources of fat and protein.  She have increased physical activity in last 3 weeks.  Also running around with 53 month old grandson, Giovanni--has him for a week at at time. Had eye exam with Dr. Dione Booze since last visit.  No diabetic changes.  3.  Arthritis:  Has increased knee and wrist discomfort since stopping Turmeric.  Her ALT did normalize subsequently in March. Is taking tart cherry.  She is getting her tart cherry from a health food store in [place].  She did not have transportation to [facility] at the time.  4.  Umbilical hernia:  Sometimes with discomfort when leaning against kitchen counter.   Current Meds  Medication Sig  . amLODipine (NORVASC) 5 MG tablet Take 1 tablet (5 mg total) by mouth daily.  Marland Kitchen AQUALANCE LANCETS 30G MISC   . aspirin 81 MG tablet Take 81 mg by mouth daily.  . cetirizine (ZYRTEC) 10 MG tablet Take 1 tablet (10 mg total) by mouth daily.  . Cholecalciferol (VITAMIN D) 2000 units CAPS Take by mouth. 1 daily  . CONTOUR TEST test strip   . Flax Oil-Fish Oil-Borage Oil (FISH OIL-FLAX OIL-BORAGE OIL PO) Take 2 capsules by mouth daily. Reported on 12/28/2015  . GARLIC PO Take 478 mg by mouth. 2 daily  . glimepiride (AMARYL) 4 MG tablet TAKE ONE TABLET BY MOUTH ONCE DAILY WITH BREAKFAST  . Lancet Devices (ADJUSTABLE LANCING DEVICE) MISC   .  lisinopril (PRINIVIL,ZESTRIL) 40 MG tablet Take 1 tablet (40 mg total) by mouth daily.  . Misc Natural Products (TART CHERRY ADVANCED PO) Take by mouth daily.  . mometasone (NASONEX) 50 MCG/ACT nasal spray 2 sprays each nostril daily  . Multiple Vitamins-Minerals (MULTIVITAMIN WITH MINERALS) tablet Take 1 tablet by mouth daily.  . rosuvastatin (CRESTOR) 10 MG tablet Take 1 tablet (10 mg total) by mouth daily.  . vitamin C (ASCORBIC ACID) 500 MG tablet Take 500 mg by mouth daily.   Allergies  Allergen Reactions  . Praluent [Alirocumab]     Had significant fatigue and memory loss--was lost driving in town.  Joint pain and nausea as well.  Marland Kitchen Zetia [Ezetimibe] Other (See Comments)    Joint pain and ear ringing  . Atorvastatin Other (See Comments)    Pt states causes bilateral lower extremity muscle cramps  . Pravastatin Nausea And Vomiting and Other (See Comments)    Causes dizziness    Review of Systems     Objective:   Physical Exam NAD Lungs:  CTA CV:  RRR with normal S1 and S2, No S3, S4 or murmur.  No carotid bruits, Carotid, radial and DP/PT pulses normal and equal Abd:  S, tender mildly over supraumbilical hernia.  Unable  to fully reduce.  + BS, No HSM or mass. LE:  No edema Right wrist:  Volar area with pea sized cyst.  Minimally tender.   Diabetic Foot Exam - Simple   Simple Foot Form Visual Inspection No deformities, no ulcerations, no other skin breakdown bilaterally:  Yes Sensation Testing Intact to touch and monofilament testing bilaterally:  Yes Pulse Check Posterior Tibialis and Dorsalis pulse intact bilaterally:  Yes Comments       Assessment & Plan:  1.  Essential Hypertension:  Controlled  2.  DM:  Check A1C.  If down substantially, will decrease Glimiperide dosing.  But suspect more of a problem with not getting enough protein and good fat in meals.  Discussed options for doing so with her current diet.  She will implement.  3.  Umbilical hernia:  To go  to ED if worsening pain or vomiting.  Otherwise, she would like to wait to have addressed surgically.  4.  Arthritis:  To go to [facility] to make sure she is getting a quality Tart Cherry for pain control.  5.  Hyperlipidemia:  Was fine at last check end of 2018.  Recheck in September.  6.  Tinnitus/decreased hearing:  Has not been able to get hold of [facility].  Will try to re refer

## 2018-03-07 LAB — HGB A1C W/O EAG: Hgb A1c MFr Bld: 6.1 % — ABNORMAL HIGH (ref 4.8–5.6)

## 2018-04-10 NOTE — Progress Notes (Signed)
Referral, [employer] , OV notes and insurance faxed to [facility]. They will contact patient and schedule appointment

## 2018-04-30 ENCOUNTER — Other Ambulatory Visit: Payer: Self-pay | Admitting: Internal Medicine

## 2018-04-30 DIAGNOSIS — J3089 Other allergic rhinitis: Secondary | ICD-10-CM

## 2018-04-30 DIAGNOSIS — I10 Essential (primary) hypertension: Secondary | ICD-10-CM

## 2018-04-30 DIAGNOSIS — E119 Type 2 diabetes mellitus without complications: Secondary | ICD-10-CM

## 2018-06-19 ENCOUNTER — Emergency Department (HOSPITAL_COMMUNITY)
Admission: EM | Admit: 2018-06-19 | Discharge: 2018-06-20 | Disposition: A | Discharge status: Home / Self Care | Payer: BLUE CROSS/BLUE SHIELD | Attending: Emergency Medicine | Admitting: Emergency Medicine

## 2018-06-19 ENCOUNTER — Encounter (HOSPITAL_COMMUNITY): Payer: Self-pay | Admitting: *Deleted

## 2018-06-19 ENCOUNTER — Other Ambulatory Visit: Payer: Self-pay

## 2018-06-19 DIAGNOSIS — Z7982 Long term (current) use of aspirin: Secondary | ICD-10-CM | POA: Diagnosis not present

## 2018-06-19 DIAGNOSIS — L02211 Cutaneous abscess of abdominal wall: Secondary | ICD-10-CM

## 2018-06-19 DIAGNOSIS — I10 Essential (primary) hypertension: Secondary | ICD-10-CM | POA: Diagnosis not present

## 2018-06-19 DIAGNOSIS — E119 Type 2 diabetes mellitus without complications: Secondary | ICD-10-CM | POA: Insufficient documentation

## 2018-06-19 DIAGNOSIS — R002 Palpitations: Secondary | ICD-10-CM | POA: Diagnosis not present

## 2018-06-19 DIAGNOSIS — Z79899 Other long term (current) drug therapy: Secondary | ICD-10-CM | POA: Diagnosis not present

## 2018-06-19 DIAGNOSIS — E785 Hyperlipidemia, unspecified: Secondary | ICD-10-CM | POA: Insufficient documentation

## 2018-06-19 DIAGNOSIS — L03311 Cellulitis of abdominal wall: Secondary | ICD-10-CM

## 2018-06-19 DIAGNOSIS — R222 Localized swelling, mass and lump, trunk: Secondary | ICD-10-CM | POA: Diagnosis present

## 2018-06-19 DIAGNOSIS — I2510 Atherosclerotic heart disease of native coronary artery without angina pectoris: Secondary | ICD-10-CM | POA: Diagnosis not present

## 2018-06-19 LAB — COMPREHENSIVE METABOLIC PANEL
ALK PHOS: 53 U/L (ref 38–126)
ALT: 26 U/L (ref 0–44)
AST: 23 U/L (ref 15–41)
Albumin: 3.5 g/dL (ref 3.5–5.0)
Anion gap: 10 (ref 5–15)
BUN: 10 mg/dL (ref 8–23)
CALCIUM: 9.8 mg/dL (ref 8.9–10.3)
CHLORIDE: 105 mmol/L (ref 98–111)
CO2: 22 mmol/L (ref 22–32)
CREATININE: 1.19 mg/dL — AB (ref 0.44–1.00)
GFR calc Af Amer: 55 mL/min — ABNORMAL LOW (ref >60)
GFR calc non Af Amer: 47 mL/min — ABNORMAL LOW (ref >60)
Glucose, Bld: 153 mg/dL — ABNORMAL HIGH (ref 70–99)
Potassium: 4 mmol/L (ref 3.5–5.1)
SODIUM: 137 mmol/L (ref 135–145)
Total Bilirubin: 0.6 mg/dL (ref 0.3–1.2)
Total Protein: 7.2 g/dL (ref 6.5–8.1)

## 2018-06-19 LAB — URINALYSIS, ROUTINE W REFLEX MICROSCOPIC
Bacteria, UA: NONE SEEN
Bilirubin Urine: NEGATIVE
GLUCOSE, UA: NEGATIVE mg/dL
Ketones, ur: NEGATIVE mg/dL
Leukocytes, UA: NEGATIVE
Nitrite: NEGATIVE
PH: 8 (ref 5.0–8.0)
PROTEIN: NEGATIVE mg/dL
Specific Gravity, Urine: 1.014 (ref 1.005–1.030)

## 2018-06-19 LAB — CBC
HCT: 42.8 % (ref 36.0–46.0)
Hemoglobin: 13.8 g/dL (ref 12.0–15.0)
MCH: 30.7 pg (ref 26.0–34.0)
MCHC: 32.2 g/dL (ref 30.0–36.0)
MCV: 95.3 fL (ref 78.0–100.0)
PLATELETS: 279 10*3/uL (ref 150–400)
RBC: 4.49 MIL/uL (ref 3.87–5.11)
RDW: 12.3 % (ref 11.5–15.5)
WBC: 10.4 10*3/uL (ref 4.0–10.5)

## 2018-06-19 LAB — LIPASE, BLOOD: LIPASE: 46 U/L (ref 11–51)

## 2018-06-19 MED ORDER — SODIUM BICARBONATE 4 % IV SOLN
5.0000 mL | Freq: Once | INTRAVENOUS | Status: AC
  Administered 2018-06-19: 5 mL via SUBCUTANEOUS
  Filled 2018-06-19: qty 5

## 2018-06-19 MED ORDER — LIDOCAINE-EPINEPHRINE (PF) 2 %-1:200000 IJ SOLN
10.0000 mL | Freq: Once | INTRAMUSCULAR | Status: AC
  Administered 2018-06-19: 10 mL
  Filled 2018-06-19: qty 20

## 2018-06-19 MED ORDER — CLINDAMYCIN PHOSPHATE 600 MG/50ML IV SOLN
600.0000 mg | Freq: Once | INTRAVENOUS | Status: AC
  Administered 2018-06-19: 600 mg via INTRAVENOUS
  Filled 2018-06-19: qty 50

## 2018-06-19 MED ORDER — LIDOCAINE-PRILOCAINE 2.5-2.5 % EX CREA
TOPICAL_CREAM | Freq: Once | CUTANEOUS | Status: AC
  Administered 2018-06-19: via TOPICAL
  Filled 2018-06-19: qty 5

## 2018-06-19 MED ORDER — ACETAMINOPHEN 325 MG PO TABS
650.0000 mg | ORAL_TABLET | Freq: Once | ORAL | Status: AC
  Administered 2018-06-19: 650 mg via ORAL
  Filled 2018-06-19: qty 2

## 2018-06-19 MED ORDER — SODIUM CHLORIDE 0.9 % IV BOLUS (SEPSIS)
1000.0000 mL | Freq: Once | INTRAVENOUS | Status: AC
  Administered 2018-06-19: 1000 mL via INTRAVENOUS

## 2018-06-19 NOTE — ED Provider Notes (Signed)
[facility] Provider [facility]   CSN: [id number] Arrival date & time: 06/19/18  2109     History   Chief Complaint Chief Complaint  Patient presents with  . Palpitations    HPI Haley [name] is a 62 y.o. female.  The history is provided by the patient.  Abscess  Location:  Torso Torso abscess location: Abdominal wall. Abscess quality: fluctuance, induration, painful, redness and warmth   Abscess quality: not draining   Duration:  5 days Progression:  Worsening Pain details:    Quality:  Sharp   Severity:  Moderate   Timing:  Constant   Progression:  Worsening Chronicity:  New Context: diabetes   Relieved by:  Nothing Exacerbated by: Palpation. Ineffective treatments:  None tried Associated symptoms: fatigue, fever and headaches   Associated symptoms: no vomiting   She reports over 5 days ago she thought she had a spider bite on her abdomen.  Since that time the wound in her abdomen is been red and painful.  Denies tick bites.  Denies actually seeing a spider.  Over the past day she has felt palpitations and fluttering in her heart.  She reports body aches, and pain in her upper back.  She reports mild headache.  No vomiting or diarrhea.  No cough or sore throat. Prior to Arrival she took nitroglycerin for her palpitations and elevated blood pressure. Denies chest pain Past Medical History:  Diagnosis Date  . Allergy   . Depression   . Diabetes mellitus without complication (HCC)   . Ganglion cyst of wrist, right 05/09/2017  . Hyperlipidemia   . Hypertension   . Shingles     Patient Active Problem List   Diagnosis Date Noted  . Ganglion cyst of wrist, right 05/09/2017  . Coronary artery disease involving native coronary artery of native heart without angina pectoris   . Abnormal screening cardiac CT 10/16/2016  . Hyperlipidemia 08/03/2016  . Chest pain 08/03/2016  . Onychomycosis of toenail 04/18/2016  . GERD  (gastroesophageal reflux disease) 02/27/2016  . Depression 02/27/2016  . Palpitation 03/19/2014  . Dyspnea on exertion 03/19/2014  . Fatigue 03/19/2014  . Essential hypertension 07/22/2013  . Obesity 07/22/2013  . Diabetes mellitus without complication (HCC) 07/22/2013  . Allergic rhinitis 07/22/2013    Past Surgical History:  Procedure Laterality Date  . ABDOMINAL HYSTERECTOMY  1994   Laparoscopic  . CARDIAC CATHETERIZATION N/A 10/23/2016   Procedure: Left Heart Cath and Coronary Angiography;  Surgeon: Kathleene Hazelhristopher D McAlhany, MD;  [facility];  Service: Cardiovascular;  Laterality: N/A;  . CHOLECYSTECTOMY  1997   Laparoscopic  . COLONOSCOPY  2005-at age 62   in [place]-polyps removed per pt   . WISDOM TOOTH EXTRACTION       OB History    Gravida  2   Para  2   Term  2   Preterm      AB      Living  2     SAB      TAB      Ectopic      Multiple      Live Births               Home Medications    Prior to Admission medications   Medication Sig Start Date End Date Taking? Authorizing Provider  amLODipine (NORVASC) 5 MG tablet TAKE 1 TABLET BY MOUTH ONCE DAILY 04/30/18  Yes Julieanne MansonMulberry, Elizabeth, MD  aspirin 81  MG tablet Take 81 mg by mouth daily.   Yes [provider]  cetirizine (ZYRTEC) 10 MG tablet TAKE 1 TABLET BY MOUTH ONCE DAILY 04/30/18  Yes Julieanne Manson, MD  Cholecalciferol (VITAMIN D) 2000 units CAPS Take by mouth. 1 daily   Yes [provider]  GARLIC PO Take 161 mg by mouth. 2 daily   Yes [provider]  glimepiride (AMARYL) 4 MG tablet TAKE 1 TABLET BY MOUTH ONCE DAILY WITH BREAKFAST 04/30/18  Yes Julieanne Manson, MD  ibuprofen (ADVIL,MOTRIN) 200 MG tablet Take 600 mg by mouth every 6 (six) hours as needed for headache.   Yes [provider]  lisinopril (PRINIVIL,ZESTRIL) 40 MG tablet TAKE 1 TABLET BY MOUTH ONCE DAILY 04/30/18  Yes Julieanne Manson, MD  Misc Natural Products  (TART CHERRY ADVANCED PO) Take by mouth daily.   Yes [provider]  mometasone (NASONEX) 50 MCG/ACT nasal spray USE 2 SPRAY(S) IN EACH NOSTRIL ONCE DAILY 04/30/18  Yes Julieanne Manson, MD  Multiple Vitamins-Minerals (MULTIVITAMIN WITH MINERALS) tablet Take 1 tablet by mouth daily.   Yes [provider]  nitroGLYCERIN (NITROSTAT) 0.4 MG SL tablet 1 tab under tongue for chest pain.  May repeat every 5 minutes x 2 if chest pain not relieved 03/05/17  Yes Julieanne Manson, MD  omega-3 acid ethyl esters (LOVAZA) 1 g capsule Take 1 g by mouth 2 (two) times daily.   Yes [provider]  rosuvastatin (CRESTOR) 10 MG tablet Take 1 tablet (10 mg total) by mouth daily. 08/20/17  Yes Julieanne Manson, MD  vitamin C (ASCORBIC ACID) 500 MG tablet Take 500 mg by mouth daily.   Yes [provider]    Family History Family History  Problem Relation Age of Onset  . Hypertension Father   . Stroke Maternal Aunt        Multiple aunts died from strokes-maternal  . Hypertension Son   . Diabetes Son   . Hypertension Brother   . Diabetes Brother   . Diabetes Sister        prediabetic  . Seizures Brother   . Colon cancer Neg Hx   . Stomach cancer Neg Hx     Social History Social History   Tobacco Use  . Smoking status: Never Smoker  . Smokeless tobacco: Never Used  Substance Use Topics  . Alcohol use: Yes    Alcohol/week: 0.0 standard drinks    Comment: occasionally-1 to 2 drinks monthly   . Drug use: No     Allergies   Praluent [alirocumab]; Zetia [ezetimibe]; Atorvastatin; and Pravastatin   Review of Systems Review of Systems  Constitutional: Positive for fatigue and fever.  HENT: Negative for sore throat.   Respiratory: Negative for cough.   Cardiovascular: Negative for chest pain and leg swelling.  Gastrointestinal: Negative for vomiting.  Skin: Positive for wound.  Neurological: Positive for headaches.  All other systems reviewed and are  negative.    Physical Exam Updated Vital Signs BP 122/67   Pulse 96   Temp (!) 102.4 F (39.1 C)   Resp 20   Ht 1.549 m (5\' 1" )   Wt 103.4 kg   SpO2 96%   BMI 43.08 kg/m   Physical Exam CONSTITUTIONAL: Well developed/well nourished HEAD: Normocephalic/atraumatic EYES: EOMI/PERRL ENMT: Mucous membranes moist NECK: supple no meningeal signs SPINE/BACK:entire spine nontender CV: S1/S2 noted, no murmurs/rubs/gallops noted LUNGS: Lungs are clear to auscultation bilaterally, no apparent distress ABDOMEN: soft, nontender, no rebound or guarding, bowel sounds  noted throughout abdomen GU:no cva tenderness NEURO: Pt is awake/alert/appropriate, moves all extremitiesx4.  No facial droop.   EXTREMITIES: pulses normal/equal, full ROM SKIN: warm, color normal, abscess with surrounding cellulitis to abdominal wall.  No crepitus or drainage. No abdominal wall hernias noted PSYCH: no abnormalities of mood noted, alert and oriented to situation   ED Treatments / Results  Labs (all labs ordered are listed, but only abnormal results are displayed) Labs Reviewed  COMPREHENSIVE METABOLIC PANEL - Abnormal; Notable for the following components:      Result Value   Glucose, Bld 153 (*)    Creatinine, Ser 1.19 (*)    GFR calc non Af Amer 47 (*)    GFR calc Af Amer 55 (*)    All other components within normal limits  URINALYSIS, ROUTINE W REFLEX MICROSCOPIC - Abnormal; Notable for the following components:   Hgb urine dipstick LARGE (*)    All other components within normal limits  LIPASE, BLOOD  CBC    EKG EKG Interpretation  Date/Time:  Wednesday June 19 2018 21:14:58 EDT Ventricular Rate:  108 PR Interval:  134 QRS Duration: 78 QT Interval:  308 QTC Calculation: 412 R Axis:   28 Text Interpretation:  ** Poor data quality, interpretation may be adversely affected Sinus tachycardia Possible Anterior infarct , age undetermined Abnormal ECG Confirmed by Zadie Rhine ([id number]) on  06/19/2018 11:12:57 PM   Radiology No results found.  Procedures .Marland KitchenIncision and Drainage Date/Time: 06/20/2018 1:30 AM Performed by: Zadie Rhine, MD Authorized by: Zadie Rhine, MD   Consent:    Consent obtained:  Verbal   Consent given by:  Patient Location:    Type:  Abscess   Size:  3 cm   Location:  Trunk   Trunk location:  Abdomen Pre-procedure details:    Skin preparation:  Betadine Anesthesia (see MAR for exact dosages):    Anesthesia method:  Local infiltration   Local anesthetic:  Sodium bicarbonate and lidocaine 2% WITH epi Procedure type:    Complexity:  Complex Procedure details:    Needle aspiration: no     Incision types:  Stab incision   Scalpel blade:  11   Wound management:  Probed and deloculated   Drainage:  Purulent and bloody   Drainage amount:  Moderate   Wound treatment:  Wound left open   Packing materials:  None Post-procedure details:    Patient tolerance of procedure:  Tolerated well, no immediate complications     Medications Ordered in ED Medications  acetaminophen (TYLENOL) tablet 650 mg (650 mg Oral Given 06/19/18 2358)  sodium chloride 0.9 % bolus 1,000 mL (0 mLs Intravenous Stopped 06/20/18 0137)  clindamycin (CLEOCIN) IVPB 600 mg (0 mg Intravenous Stopped 06/20/18 0137)  lidocaine-EPINEPHrine (XYLOCAINE W/EPI) 2 %-1:200000 (PF) injection 10 mL (10 mLs Infiltration Given 06/19/18 2357)  sodium bicarbonate (NEUT) 4 % injection 5 mL (5 mLs Subcutaneous Given 06/19/18 2358)  lidocaine-prilocaine (EMLA) cream ( Topical Given 06/19/18 2358)     Initial Impression / Assessment and Plan / ED Course  I have reviewed the triage vital signs and the nursing notes.  Pertinent labs results that were available during my care of the patient were reviewed by me and considered in my medical decision making (see chart for details).     Patient found to have abscess with localized cellulitis.  This was likely cause of her symptoms.  She is  much improved after I&D.  She is ambulatory and in no distress.  She  is not septic appearing.  She has no other focal abdominal tenderness.  Most likely cutaneous abscess with cellulitis.  She feels comfortable with discharge home and will be on oral antibiotics.  We discussed strict ER return precautions.  We discussed wound care precautions.  Final Clinical Impressions(s) / ED Diagnoses   Final diagnoses:  Abscess of abdominal wall  Cellulitis of abdominal wall    ED Discharge Orders         Ordered    clindamycin (CLEOCIN) 300 MG capsule  4 times daily     06/20/18 0142           Zadie RhineWickline, Colleene Swarthout, MD 06/20/18 0155

## 2018-06-19 NOTE — ED Triage Notes (Signed)
The pt took ibu at 2045 tonight

## 2018-06-19 NOTE — ED Triage Notes (Signed)
The pt has multiple complaints  She thinks she was bitten by somehting last Thursday  Since then she has had fever chills headache  C/o palpitations  And she has redness to her abd from the bite  She took a nitro on the way here ???  Back pain

## 2018-06-19 NOTE — ED Notes (Signed)
Writer drew a set of blood cultures, cultures in the mini lab.

## 2018-06-20 MED ORDER — CLINDAMYCIN HCL 300 MG PO CAPS: 300.0000 mg | ORAL_CAPSULE | Freq: Four times a day (QID) | ORAL | 0 refills | Status: DC

## 2018-06-20 NOTE — ED Notes (Signed)
Pt ambulated in the hall with no problem 

## 2018-06-20 NOTE — ED Notes (Signed)
PT states understanding of care given, follow up care, and medication prescribed. PT ambulated from ED to car with a steady gait. 

## 2018-06-25 ENCOUNTER — Ambulatory Visit (INDEPENDENT_AMBULATORY_CARE_PROVIDER_SITE_OTHER): Payer: BLUE CROSS/BLUE SHIELD | Admitting: Internal Medicine

## 2018-06-25 ENCOUNTER — Encounter: Payer: Self-pay | Admitting: Internal Medicine

## 2018-06-25 VITALS — BP 138/88 | HR 80 | Temp 98.2°F | Resp 12 | Ht 61.25 in | Wt 230.0 lb

## 2018-06-25 DIAGNOSIS — L02211 Cutaneous abscess of abdominal wall: Secondary | ICD-10-CM | POA: Diagnosis not present

## 2018-06-25 DIAGNOSIS — E119 Type 2 diabetes mellitus without complications: Secondary | ICD-10-CM | POA: Diagnosis not present

## 2018-06-25 LAB — GLUCOSE, POCT (MANUAL RESULT ENTRY): POC Glucose: 100 mg/dl — AB (ref 70–99)

## 2018-06-25 MED ORDER — CLINDAMYCIN HCL 300 MG PO CAPS: ORAL_CAPSULE | ORAL | 0 refills | Status: DC

## 2018-06-25 NOTE — Patient Instructions (Addendum)
Call for diarrhea or increasing swelling of abdominal wall. Warm pack with medium heat for 20 minutes about 30 minutes after taking Clindamycin 4 times daily.

## 2018-06-25 NOTE — Progress Notes (Signed)
Subjective:    Patient ID: Haley [name], female    DOB: 06/08/1956, 62 y.o.   MRN: [id number]  HPI   Abdominal Abscess:  Noted spider bite on mid left abdomen on a Wednesday 2 weeks ago.  Was very pruritic.  She treated with alcohol topically.  Noted erythema and ultimately fever intermittently.  She treated with [place] ointment and next day, looked great.  The next day came back with vengeance with erythema and swelling and fever up to 102 in the evening.  Finally went to ED on 06/19/2018 as she felt her heart palpitation.  I and D performed and started on Clindamycin 300 mg 4 times daily for 7 days. Redness and swelling has resolved.  Does have a bit of pus and blood drainage from the wound.   She still has a "knot" in the area and just does not feel 100%. Still feels she is having a low grad temp at night--99.6 F last night.  Gets really cold when temp up.   She has 2 more days of antibiotics/Clindamycin.  No diarrhea or yeast infection symptoms yet.  Sugars have been well controlled  Current Meds  Medication Sig  . amLODipine (NORVASC) 5 MG tablet TAKE 1 TABLET BY MOUTH ONCE DAILY  . aspirin 81 MG tablet Take 81 mg by mouth daily.  . cetirizine (ZYRTEC) 10 MG tablet TAKE 1 TABLET BY MOUTH ONCE DAILY  . Cholecalciferol (VITAMIN D) 2000 units CAPS Take by mouth. 1 daily  . clindamycin (CLEOCIN) 300 MG capsule 1 cap by mouth 4 times daily for 3 more days after completing initial 7 days  . GARLIC PO Take [id number] mg by mouth. 2 daily  . glimepiride (AMARYL) 4 MG tablet TAKE 1 TABLET BY MOUTH ONCE DAILY WITH BREAKFAST  . ibuprofen (ADVIL,MOTRIN) 200 MG tablet Take 600 mg by mouth every 6 (six) hours as needed for headache.  . lisinopril (PRINIVIL,ZESTRIL) 40 MG tablet TAKE 1 TABLET BY MOUTH ONCE DAILY  . Misc Natural Products (TART CHERRY ADVANCED PO) Take by mouth daily.  . mometasone (NASONEX) 50 MCG/ACT nasal spray USE 2 SPRAY(S) IN EACH NOSTRIL ONCE DAILY  . Multiple  Vitamins-Minerals (MULTIVITAMIN WITH MINERALS) tablet Take 1 tablet by mouth daily.  . nitroGLYCERIN (NITROSTAT) 0.4 MG SL tablet 1 tab under tongue for chest pain.  May repeat every 5 minutes x 2 if chest pain not relieved  . omega-3 acid ethyl esters (LOVAZA) 1 g capsule Take 1 g by mouth 2 (two) times daily.  . rosuvastatin (CRESTOR) 10 MG tablet Take 1 tablet (10 mg total) by mouth daily.  . vitamin C (ASCORBIC ACID) 500 MG tablet Take 500 mg by mouth daily.  . [DISCONTINUED] clindamycin (CLEOCIN) 300 MG capsule Take 1 capsule (300 mg total) by mouth 4 (four) times daily. X 7 days    Allergies  Allergen Reactions  . Praluent [Alirocumab]     Had significant fatigue and memory loss--was lost driving in town.  Joint pain and nausea as well.  Marland Kitchen. Zetia [Ezetimibe] Other (See Comments)    Joint pain and ear ringing  . Atorvastatin Other (See Comments)    Pt states causes bilateral lower extremity muscle cramps  . Pravastatin Nausea And Vomiting and Other (See Comments)    Causes dizziness     Review of Systems     Objective:   Physical Exam   NAD, Looks well.  Abd:  Dime sized opening in left mid abdomen where I and  D performed.  No cutaneous erythema or warmth surrounding, but can palpate induration of deeper tissues surrounding.  No fluctuance.  Minimal tenderness.  Unable to produce drainage from open site with pressure over induration.     Assessment & Plan:  Abdominal wall abscess:  Appears to be improving nicely, but still with some subcutaneous inflammation.  Will extend out Clindamycin for 10 day course and asked her to warm pack the area for 20 minutes about 30 minutes after taking the Clindamycin 4 times daily. To call for diarrhea or worsening hardness/swelling of abdominal area involved. Follow up end of week, 3 days.

## 2018-06-28 ENCOUNTER — Ambulatory Visit (INDEPENDENT_AMBULATORY_CARE_PROVIDER_SITE_OTHER): Payer: BLUE CROSS/BLUE SHIELD | Admitting: Internal Medicine

## 2018-06-28 ENCOUNTER — Encounter: Payer: Self-pay | Admitting: Internal Medicine

## 2018-06-28 VITALS — BP 130/82 | HR 72 | Temp 98.1°F | Resp 12 | Ht 61.25 in | Wt 231.0 lb

## 2018-06-28 DIAGNOSIS — L02211 Cutaneous abscess of abdominal wall: Secondary | ICD-10-CM | POA: Diagnosis not present

## 2018-06-28 DIAGNOSIS — R5383 Other fatigue: Secondary | ICD-10-CM | POA: Diagnosis not present

## 2018-06-28 DIAGNOSIS — R5381 Other malaise: Secondary | ICD-10-CM

## 2018-06-28 NOTE — Progress Notes (Signed)
   Subjective:    Patient ID: Haley [name], female    DOB: 07-24-1956, 62 y.o.   MRN: [id number]  HPI   Abdominal wound abscess:  Feels that after application of the heating pad for 24 hours, she feels worse.  No energy, aching all over.  At the same time the induration has gone down. Has had just a little bit of drainage from the wound. Has felt feverish at night, but no fever at 96.5 when checked.   Has been a little nauseated, but able to eat her usual diet.   Last night could not sleep well. No diarrhea--in fact, having constipation. Has 2/10 days left of Clindamycin  Current Meds  Medication Sig  . amLODipine (NORVASC) 5 MG tablet TAKE 1 TABLET BY MOUTH ONCE DAILY  . aspirin 81 MG tablet Take 81 mg by mouth daily.  . cetirizine (ZYRTEC) 10 MG tablet TAKE 1 TABLET BY MOUTH ONCE DAILY  . Cholecalciferol (VITAMIN D) 2000 units CAPS Take by mouth. 1 daily  . clindamycin (CLEOCIN) 300 MG capsule 1 cap by mouth 4 times daily for 3 more days after completing initial 7 days  . GARLIC PO Take [id number] mg by mouth. 2 daily  . glimepiride (AMARYL) 4 MG tablet TAKE 1 TABLET BY MOUTH ONCE DAILY WITH BREAKFAST  . lisinopril (PRINIVIL,ZESTRIL) 40 MG tablet TAKE 1 TABLET BY MOUTH ONCE DAILY  . Misc Natural Products (TART CHERRY ADVANCED PO) Take by mouth daily.  . mometasone (NASONEX) 50 MCG/ACT nasal spray USE 2 SPRAY(S) IN EACH NOSTRIL ONCE DAILY  . Multiple Vitamins-Minerals (MULTIVITAMIN WITH MINERALS) tablet Take 1 tablet by mouth daily.  . nitroGLYCERIN (NITROSTAT) 0.4 MG SL tablet 1 tab under tongue for chest pain.  May repeat every 5 minutes x 2 if chest pain not relieved  . omega-3 acid ethyl esters (LOVAZA) 1 g capsule Take 1 g by mouth 2 (two) times daily.  . rosuvastatin (CRESTOR) 10 MG tablet Take 1 tablet (10 mg total) by mouth daily.  . vitamin C (ASCORBIC ACID) 500 MG tablet Take 500 mg by mouth daily.  Marland Kitchen. zinc gluconate 50 MG tablet Take 50 mg by mouth daily.    Allergies    Allergen Reactions  . Praluent [Alirocumab]     Had significant fatigue and memory loss--was lost driving in town.  Joint pain and nausea as well.  Marland Kitchen. Zetia [Ezetimibe] Other (See Comments)    Joint pain and ear ringing  . Atorvastatin Other (See Comments)    Pt states causes bilateral lower extremity muscle cramps  . Pravastatin Nausea And Vomiting and Other (See Comments)    Causes dizziness    Review of Systems     Objective:   Physical Exam NAD Lungs:  CTA CV:  RRR without murmur or rub.  Radial and DP pulses normal and equal Abd:  S, + BS, abdominal wound:  minimal sanguinous discharge on dressing--size of dime.  No erythema.  Deep induration now resolved below wound and about 1/4 of size it was above wound on the 20th. LE:  No edema       Assessment & Plan:  1.  Cutaneous abdominal abscess:  Wound area appears much improved.  Check CBC/CMP as not feeling well.  Finish antibiotics  2.  Constipation:  Ok to try Dulcolax but to be careful about initiating diarrhea as continues on Clindamycin.  3.  DM:  controlled

## 2018-06-29 LAB — CBC WITH DIFFERENTIAL/PLATELET
BASOS: 1 %
Basophils Absolute: 0 10*3/uL (ref 0.0–0.2)
EOS (ABSOLUTE): 0.2 10*3/uL (ref 0.0–0.4)
EOS: 3 %
HEMATOCRIT: 40.6 % (ref 34.0–46.6)
Hemoglobin: 13 g/dL (ref 11.1–15.9)
Immature Grans (Abs): 0.1 10*3/uL (ref 0.0–0.1)
Immature Granulocytes: 1 %
LYMPHS ABS: 2.4 10*3/uL (ref 0.7–3.1)
Lymphs: 34 %
MCH: 30.5 pg (ref 26.6–33.0)
MCHC: 32 g/dL (ref 31.5–35.7)
MCV: 95 fL (ref 79–97)
MONOS ABS: 0.7 10*3/uL (ref 0.1–0.9)
Monocytes: 10 %
NEUTROS PCT: 51 %
Neutrophils Absolute: 3.6 10*3/uL (ref 1.4–7.0)
PLATELETS: 361 10*3/uL (ref 150–450)
RBC: 4.26 x10E6/uL (ref 3.77–5.28)
RDW: 13.1 % (ref 12.3–15.4)
WBC: 7.1 10*3/uL (ref 3.4–10.8)

## 2018-06-29 LAB — COMPREHENSIVE METABOLIC PANEL
A/G RATIO: 1.3 (ref 1.2–2.2)
ALK PHOS: 56 IU/L (ref 39–117)
ALT: 37 IU/L — AB (ref 0–32)
AST: 26 IU/L (ref 0–40)
Albumin: 3.9 g/dL (ref 3.6–4.8)
BILIRUBIN TOTAL: 0.3 mg/dL (ref 0.0–1.2)
BUN/Creatinine Ratio: 9 — ABNORMAL LOW (ref 12–28)
BUN: 11 mg/dL (ref 8–27)
CHLORIDE: 102 mmol/L (ref 96–106)
CO2: 25 mmol/L (ref 20–29)
Calcium: 10.1 mg/dL (ref 8.7–10.3)
Creatinine, Ser: 1.19 mg/dL — ABNORMAL HIGH (ref 0.57–1.00)
GFR calc Af Amer: 56 mL/min/{1.73_m2} — ABNORMAL LOW (ref >59)
GFR calc non Af Amer: 48 mL/min/{1.73_m2} — ABNORMAL LOW (ref >59)
GLOBULIN, TOTAL: 3.1 g/dL (ref 1.5–4.5)
Glucose: 125 mg/dL — ABNORMAL HIGH (ref 65–99)
POTASSIUM: 4.3 mmol/L (ref 3.5–5.2)
SODIUM: 141 mmol/L (ref 134–144)
Total Protein: 7 g/dL (ref 6.0–8.5)

## 2018-07-01 ENCOUNTER — Telehealth: Payer: Self-pay | Admitting: Internal Medicine

## 2018-07-01 NOTE — Telephone Encounter (Signed)
Doing better.  Hardening around wound continues to soften.  Done with Clindamycin and no diarrhea. She has a followup first week of September. To call if any problems

## 2018-07-02 ENCOUNTER — Telehealth: Payer: Self-pay | Admitting: Internal Medicine

## 2018-07-02 MED ORDER — FLUCONAZOLE 150 MG PO TABS: ORAL_TABLET | ORAL | 0 refills | Status: DC

## 2018-07-02 NOTE — Telephone Encounter (Signed)
Patient was taking antibodics.  Took last dose Monday and now has a yeast infection.  Patient stated she did inform doctor of this concern.  Patient needs Rx called in for yeast infection.  [facility] on [address].

## 2018-07-02 NOTE — Telephone Encounter (Signed)
Left message for patient. Rx called into [facility] on [address]

## 2018-07-09 ENCOUNTER — Ambulatory Visit: Payer: BLUE CROSS/BLUE SHIELD | Admitting: Internal Medicine

## 2018-07-09 ENCOUNTER — Encounter: Payer: Self-pay | Admitting: Internal Medicine

## 2018-07-09 VITALS — BP 130/82 | HR 78 | Resp 12 | Ht 61.25 in | Wt 231.0 lb

## 2018-07-09 DIAGNOSIS — R748 Abnormal levels of other serum enzymes: Secondary | ICD-10-CM

## 2018-07-09 DIAGNOSIS — Z1239 Encounter for other screening for malignant neoplasm of breast: Secondary | ICD-10-CM

## 2018-07-09 DIAGNOSIS — Z1231 Encounter for screening mammogram for malignant neoplasm of breast: Secondary | ICD-10-CM | POA: Diagnosis not present

## 2018-07-09 DIAGNOSIS — Z0000 Encounter for general adult medical examination without abnormal findings: Secondary | ICD-10-CM

## 2018-07-09 DIAGNOSIS — E119 Type 2 diabetes mellitus without complications: Secondary | ICD-10-CM | POA: Diagnosis not present

## 2018-07-09 DIAGNOSIS — E7800 Pure hypercholesterolemia, unspecified: Secondary | ICD-10-CM | POA: Diagnosis not present

## 2018-07-09 DIAGNOSIS — Z79899 Other long term (current) drug therapy: Secondary | ICD-10-CM

## 2018-07-09 DIAGNOSIS — Z780 Asymptomatic menopausal state: Secondary | ICD-10-CM

## 2018-07-09 MED ORDER — GLIMEPIRIDE 2 MG PO TABS: ORAL_TABLET | ORAL | 11 refills | Status: DC

## 2018-07-09 NOTE — Patient Instructions (Addendum)
Can google "advance directives, Roundup"  And bring up form from Secretary of [place]. Print and fill out Or can go to "5 wishes"  Which is also in Spanish and fill out--this costs $5--perhaps easier to use. Designate a Medical Power of Attorney to speak for you if you are unable to speak for yourself when ill or injured  [employer] for [employer] Information Program (Medicare Options):  [phone] for [place] and [place]  [phone] for all other areas of [place] [facility] Address: [address] [place], [place] [address] Phone: [phone]  Ask to speak with the man or woman in charge of [employer]-I would recommend you look into Part A and D.

## 2018-07-09 NOTE — Progress Notes (Signed)
Subjective:     Patient ID: Haley [name], female   DOB: [birth year], 62 y.o.   MRN: [id number]  HPI   CPE with pap  1.  Pap:  Hysterectomy in 1994 for prolapsed uterus.  Sister with history of cervical cancer.  2.  Mammogram:  Last mammogram 08/06/2017, normal.  No family history of breast cancer.  3.  Osteoprevention:  Occasional intake of yogurt or cheese. She does like milk. She likes almond milk.  Has an exercise program every other day.  She will start walking more when cooler temps.  Has never had DEXA.     4.  Guaiac Cards:  Performed before--negative  5.  Colonoscopy:  Suboptimal colonoscopy in 10/2017 due to poor prep.  She has been unable to reschedule due to lack of transport by someone else.    6.  Immunizations:   Immunization History  Administered Date(s) Administered  . Influenza, Quadrivalent, Recombinant, Inj, Pf 08/01/2016  . Influenza,inj,Quad PF,6+ Mos 12/28/2015  . Influenza-Unspecified 07/10/2017  . Pneumococcal Polysaccharide-23 08/13/1996, 07/22/2013  . Td 11/12/1996  . Tdap 04/18/2016    7.  Glucose/Cholesterol:  At goal with DM and cholesterol.   A1C was 6.1%  Lipid Panel     Component Value Date/Time   CHOL 136 10/12/2017 1000   TRIG 79 10/12/2017 1000   HDL 48 10/12/2017 1000   LDLCALC 72 10/12/2017 1000   LDLDIRECT 134 (H) 07/22/2013 1620   Current Meds  Medication Sig  . amLODipine (NORVASC) 5 MG tablet TAKE 1 TABLET BY MOUTH ONCE DAILY  . aspirin 81 MG tablet Take 81 mg by mouth daily.  . cetirizine (ZYRTEC) 10 MG tablet TAKE 1 TABLET BY MOUTH ONCE DAILY  . Cholecalciferol (VITAMIN D) 2000 units CAPS Take by mouth. 1 daily  . GARLIC PO Take 409 mg by mouth. 2 daily  . glimepiride (AMARYL) 4 MG tablet TAKE 1 TABLET BY MOUTH ONCE DAILY WITH BREAKFAST  . lisinopril (PRINIVIL,ZESTRIL) 40 MG tablet TAKE 1 TABLET BY MOUTH ONCE DAILY  . MILK THISTLE PO Take by mouth daily.  . Misc Natural Products (TART CHERRY ADVANCED PO) Take by mouth  daily.  . mometasone (NASONEX) 50 MCG/ACT nasal spray USE 2 SPRAY(S) IN EACH NOSTRIL ONCE DAILY  . Multiple Vitamins-Minerals (MULTIVITAMIN WITH MINERALS) tablet Take 1 tablet by mouth daily.  . nitroGLYCERIN (NITROSTAT) 0.4 MG SL tablet 1 tab under tongue for chest pain.  May repeat every 5 minutes x 2 if chest pain not relieved  . omega-3 acid ethyl esters (LOVAZA) 1 g capsule Take 1 g by mouth 2 (two) times daily.  . rosuvastatin (CRESTOR) 10 MG tablet Take 1 tablet (10 mg total) by mouth daily.  . vitamin C (ASCORBIC ACID) 500 MG tablet Take 500 mg by mouth daily.  Marland Kitchen zinc gluconate 50 MG tablet Take 50 mg by mouth daily.    Allergies  Allergen Reactions  . Praluent [Alirocumab]     Had significant fatigue and memory loss--was lost driving in town.  Joint pain and nausea as well.  Marland Kitchen Zetia [Ezetimibe] Other (See Comments)    Joint pain and ear ringing  . Atorvastatin Other (See Comments)    Pt states causes bilateral lower extremity muscle cramps  . Pravastatin Nausea And Vomiting and Other (See Comments)    Causes dizziness   Past Medical History:  Diagnosis Date  . Allergy   . Depression   . Diabetes mellitus without complication (HCC)   . Ganglion cyst of  wrist, right 05/09/2017  . Hyperlipidemia   . Hypertension   . Shingles    Past Surgical History:  Procedure Laterality Date  . ABDOMINAL HYSTERECTOMY  1994   Laparoscopic  . CARDIAC CATHETERIZATION N/A 10/23/2016   Procedure: Left Heart Cath and Coronary Angiography;  Surgeon: Kathleene Hazel, MD;  Location: [facility] INVASIVE CV LAB;  Service: Cardiovascular;  Laterality: N/A;  . CHOLECYSTECTOMY  1997   Laparoscopic  . COLONOSCOPY  2005-at age 72   in [place]-polyps removed per pt   . WISDOM TOOTH EXTRACTION      Social History   Socioeconomic History  . Marital status: Single    Spouse name: Not on file  . Number of children: 2  . Years of education: 12+  . Highest education level: Not on file   Occupational History  . 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 Financial resource strain: Not very hard  . Food insecurity:    Worry: Never true    Inability: Never true  . Transportation needs:    Medical: No    Non-medical: No  Tobacco Use  . Smoking status: Never Smoker  . Smokeless tobacco: Never Used  Substance and Sexual Activity  . Alcohol use: Yes    Alcohol/week: 0.0 standard drinks    Comment: occasionally-1 to 2 drinks monthly   . Drug use: No  . Sexual activity: Never  Lifestyle  . Physical activity:    Days per week: 3 days    Minutes per session: 30 min  . Stress: Only a little  Relationships  . Social connections:    Talks on phone: More than three times a week    Gets together: More than three times a week    Attends religious service: 1 to 4 times per year    Active member of club or organization: No    Attends meetings of clubs or organizations: Never    Relationship status: Divorced  . Intimate partner violence:    Fear of current or ex partner: No    Emotionally abused: No    Physically abused: No    Forced sexual activity: No  Other Topics Concern  . Not on file  Social History Narrative   Did get some post high school training/education   Lives alone    Family History  Problem Relation Age of Onset  . Diabetes Father   . Stroke Maternal Aunt        Multiple aunts died from strokes-maternal  . Hypertension Son   . Diabetes Son   . Hypertension Brother   . Diabetes Brother   . Diabetes Sister        prediabetic  . Cancer Sister 18       uterine cancer  . Seizures Brother   . Colon cancer Neg Hx   . Stomach cancer Neg Hx     Review of Systems  Constitutional: Negative for appetite change and fatigue.  HENT: Positive for hearing loss (Tinnitus) and tinnitus. Negative for dental problem.   Eyes: Negative for visual disturbance (Bifocals.  Last eye check with Dr. Dione Booze 08/14/2017).  Respiratory: Positive  for shortness of breath (Occasional with palpitations.  She has this daily with short lived palpitations. No light headedness. May be related to coffe intake.). Negative for cough.   Cardiovascular: Positive for palpitations. Negative for chest pain.  Gastrointestinal: Positive for abdominal pain (Suprapubic area.  Deep throbbing pain.  Rare.  May  have for 2 days with pain on and off.  Can awaken from sleep.  No position is comfortable.  Last episode was 3 months ago.  Has had for 20 years and had been evaluated before without findings.  S/P hyster.). Negative for blood in stool (No melena), constipation and diarrhea.  Genitourinary: Negative for dysuria, frequency, vaginal bleeding and vaginal discharge.  Musculoskeletal: Positive for arthralgias (Left lateral thigh hurts at times.).  Skin:       Abdominal abscess wound healing nicely.  Constipation resolved when stopped Clindamycin  Neurological: Negative for weakness and numbness.  Psychiatric/Behavioral: Positive for sleep disturbance (May be depressed at times, but not long lasting as before.  Problems for a month.  Able to initiate sleep, but waking frequently and difficulty reestablishing sleep.  Stays in bed. ). Negative for dysphoric mood. The patient is not nervous/anxious.        Objective:   Physical Exam  Constitutional: She is oriented to person, place, and time. She appears well-developed and well-nourished.  HENT:  Head: Normocephalic and atraumatic.  Right Ear: Hearing, tympanic membrane, external ear and ear canal normal.  Left Ear: Hearing, tympanic membrane, external ear and ear canal normal.  Nose: Nose normal.  Mouth/Throat: Uvula is midline, oropharynx is clear and moist and mucous membranes are normal.  Eyes: Pupils are equal, round, and reactive to light. Conjunctivae and EOM are normal.  Discs sharp bilaterally.  Neck: Normal range of motion and full passive range of motion without pain. Neck supple. No thyromegaly  present.  Cardiovascular: Normal rate and regular rhythm. Exam reveals no S3, no S4 and no friction rub.  No murmur heard. Pulses:      Dorsalis pedis pulses are 2+ on the left side.       Posterior tibial pulses are 2+ on the right side, and 2+ on the left side.  No carotid bruits.  Carotid, radial, femoral, DP and PT pulses normal and equal.   Pulmonary/Chest: Effort normal and breath sounds normal. Right breast exhibits no inverted nipple, no mass, no nipple discharge, no skin change and no tenderness. Left breast exhibits no inverted nipple, no mass, no nipple discharge, no skin change and no tenderness.  Abdominal: Soft. Bowel sounds are normal. She exhibits no mass. There is no hepatosplenomegaly. There is no tenderness. No hernia.  Genitourinary: Rectum normal. Rectal exam shows no mass and guaiac negative stool.  Genitourinary Comments: Normal external female genitalia.   No adnexal area mass or tenderness. Mild tenderness mid suprapubic area.  No rebound or peritoneal signs.    Musculoskeletal: Normal range of motion.       Right foot: There is normal range of motion and no deformity.       Left foot: There is normal range of motion and no deformity.  Feet:  Right Foot:  Protective Sensation: 10 sites tested. 10 sites sensed.  Skin Integrity: Negative for ulcer or skin breakdown.  Left Foot:  Protective Sensation: 10 sites tested. 10 sites sensed.  Skin Integrity: Negative for ulcer or skin breakdown.  Lymphadenopathy:       Head (right side): No submental and no submandibular adenopathy present.       Head (left side): No submental and no submandibular adenopathy present.    She has no cervical adenopathy.    She has no axillary adenopathy.       Right: No inguinal and no supraclavicular adenopathy present.       Left: No inguinal and no  supraclavicular adenopathy present.  Neurological: She is alert and oriented to person, place, and time. She has normal strength and normal  reflexes. No cranial nerve deficit or sensory deficit. She exhibits normal muscle tone. Coordination and gait normal.  Skin: Skin is warm. Capillary refill takes less than 2 seconds. No rash noted.  Psychiatric: She has a normal mood and affect. Her speech is normal and behavior is normal. Judgment and thought content normal. Cognition and memory are normal.      TEnder over suprapubic area midline only Assessment/Plan:    1.  CPE without pap   To call for influenza vaccine clinic dates. Will need PCV 13 when 63 To check with [facility] to find out which Medicare plan to pursue. DXA Bone density:  Postmenopausal and financially can pursue at this time. Needs to get set back up with Colonoscopy.  Not ready to do so currently. Mammogram   2.  DM:  Increase Glimepiride to 6 mg daily with breakfast.  3.  Hypercholesterolemia: FLP, CMP  4.  Elevated Liver enzymes:  CMP

## 2018-07-10 ENCOUNTER — Other Ambulatory Visit: Payer: Self-pay | Admitting: Internal Medicine

## 2018-07-10 DIAGNOSIS — Z1231 Encounter for screening mammogram for malignant neoplasm of breast: Secondary | ICD-10-CM

## 2018-07-23 ENCOUNTER — Other Ambulatory Visit: Payer: BLUE CROSS/BLUE SHIELD

## 2018-07-23 DIAGNOSIS — E7800 Pure hypercholesterolemia, unspecified: Secondary | ICD-10-CM

## 2018-07-23 DIAGNOSIS — Z79899 Other long term (current) drug therapy: Secondary | ICD-10-CM

## 2018-07-23 DIAGNOSIS — E119 Type 2 diabetes mellitus without complications: Secondary | ICD-10-CM

## 2018-07-23 DIAGNOSIS — Z1211 Encounter for screening for malignant neoplasm of colon: Secondary | ICD-10-CM

## 2018-07-23 DIAGNOSIS — R002 Palpitations: Secondary | ICD-10-CM

## 2018-07-23 LAB — POC HEMOCCULT BLD/STL (HOME/3-CARD/SCREEN)
Card #2 Fecal Occult Blod, POC: NEGATIVE
FECAL OCCULT BLD: NEGATIVE
Fecal Occult Blood, POC: NEGATIVE

## 2018-07-23 NOTE — Addendum Note (Signed)
Addended by: Marcelino FreestoneHAZLEY, Deamonte Sayegh on: 07/23/2018 05:09 PM   Modules accepted: Orders

## 2018-07-23 NOTE — Addendum Note (Signed)
Addended by: Marcelino FreestoneHAZLEY, Aadarsh Cozort on: 07/23/2018 05:08 PM   Modules accepted: Orders

## 2018-07-24 LAB — LIPID PANEL W/O CHOL/HDL RATIO
Cholesterol, Total: 119 mg/dL (ref 100–199)
HDL: 46 mg/dL (ref >39)
LDL CALC: 55 mg/dL (ref 0–99)
Triglycerides: 88 mg/dL (ref 0–149)
VLDL CHOLESTEROL CAL: 18 mg/dL (ref 5–40)

## 2018-07-24 LAB — COMPREHENSIVE METABOLIC PANEL
ALK PHOS: 53 IU/L (ref 39–117)
ALT: 35 IU/L — ABNORMAL HIGH (ref 0–32)
AST: 24 IU/L (ref 0–40)
Albumin/Globulin Ratio: 1.4 (ref 1.2–2.2)
Albumin: 3.8 g/dL (ref 3.6–4.8)
BUN/Creatinine Ratio: 10 — ABNORMAL LOW (ref 12–28)
BUN: 11 mg/dL (ref 8–27)
Bilirubin Total: 0.4 mg/dL (ref 0.0–1.2)
CO2: 22 mmol/L (ref 20–29)
CREATININE: 1.09 mg/dL — AB (ref 0.57–1.00)
Calcium: 9.5 mg/dL (ref 8.7–10.3)
Chloride: 105 mmol/L (ref 96–106)
GFR calc Af Amer: 62 mL/min/{1.73_m2} (ref >59)
GFR calc non Af Amer: 54 mL/min/{1.73_m2} — ABNORMAL LOW (ref >59)
GLUCOSE: 91 mg/dL (ref 65–99)
Globulin, Total: 2.7 g/dL (ref 1.5–4.5)
Potassium: 4 mmol/L (ref 3.5–5.2)
Sodium: 141 mmol/L (ref 134–144)
Total Protein: 6.5 g/dL (ref 6.0–8.5)

## 2018-07-24 LAB — TSH: TSH: 3.06 u[IU]/mL (ref 0.450–4.500)

## 2018-07-24 LAB — MICROALBUMIN / CREATININE URINE RATIO
Creatinine, Urine: 68.4 mg/dL
Microalb/Creat Ratio: <4.4 mg/g creat (ref 0.0–30.0)
Microalbumin, Urine: <3 ug/mL

## 2018-07-24 LAB — HGB A1C W/O EAG: HEMOGLOBIN A1C: 6.4 % — AB (ref 4.8–5.6)

## 2018-09-12 ENCOUNTER — Ambulatory Visit
Admission: RE | Admit: 2018-09-12 | Discharge: 2018-09-12 | Disposition: A | Discharge status: Home / Self Care | Payer: BLUE CROSS/BLUE SHIELD | Source: Ambulatory Visit | Attending: Internal Medicine | Admitting: Internal Medicine

## 2018-09-12 DIAGNOSIS — Z780 Asymptomatic menopausal state: Secondary | ICD-10-CM

## 2018-09-12 DIAGNOSIS — Z1231 Encounter for screening mammogram for malignant neoplasm of breast: Secondary | ICD-10-CM

## 2018-10-08 ENCOUNTER — Ambulatory Visit: Payer: BLUE CROSS/BLUE SHIELD | Admitting: Internal Medicine

## 2018-10-08 ENCOUNTER — Encounter: Payer: Self-pay | Admitting: Internal Medicine

## 2018-10-08 VITALS — BP 152/98 | HR 76 | Resp 12 | Ht 61.25 in | Wt 230.0 lb

## 2018-10-08 DIAGNOSIS — R079 Chest pain, unspecified: Secondary | ICD-10-CM | POA: Diagnosis not present

## 2018-10-08 DIAGNOSIS — E7800 Pure hypercholesterolemia, unspecified: Secondary | ICD-10-CM

## 2018-10-08 DIAGNOSIS — G8929 Other chronic pain: Secondary | ICD-10-CM

## 2018-10-08 DIAGNOSIS — E119 Type 2 diabetes mellitus without complications: Secondary | ICD-10-CM | POA: Diagnosis not present

## 2018-10-08 DIAGNOSIS — I10 Essential (primary) hypertension: Secondary | ICD-10-CM

## 2018-10-08 DIAGNOSIS — M545 Low back pain: Secondary | ICD-10-CM

## 2018-10-08 DIAGNOSIS — H9313 Tinnitus, bilateral: Secondary | ICD-10-CM | POA: Diagnosis not present

## 2018-10-08 MED ORDER — CYCLOBENZAPRINE HCL 10 MG PO TABS: ORAL_TABLET | ORAL | 0 refills | Status: DC

## 2018-10-08 MED ORDER — METOPROLOL TARTRATE 25 MG PO TABS: 25.0000 mg | ORAL_TABLET | Freq: Two times a day (BID) | ORAL | 11 refills | Status: DC

## 2018-10-08 NOTE — Progress Notes (Signed)
Subjective:    Patient ID: Haley [name], female    DOB: 1956-08-17, 62 y.o.   MRN: [id number]  HPI   1.  DM:  Has not been eating well with the holidays.  She is now using a diabetic pack of different vitamins and minerals.  Highest her sugar has run is 115.  Generally, in 90s.  She really just checks in the afternoon after lunch.   Last eye exam in October.    2.  CAD:  Taking nitroglycerin frequently in past month.  Having chest pain every other day.  Previously, was fairly rare.   She is STRESSED with different family matters.  Worried about her daughter.  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 She is also having difficulties getting up to her 3rd floor apt. Gets quite winded.  Even with walking across the parking. Previously, was only with exertion.   Recently at rest, she gets palpitations, then a tight squeezing feeling in her substernal area.  Takes a SL NTG and resolved within 5 minutes.    3.  Hyperlipidemia:  Has been off Rosuvastatin for about 5 weeks to see if the ringing in her ears would subside.  She states the ringing has decreased, but has not resolved.   She has been seen by [facility] and does have hearing loss, but not to level of needing hearing aid.  4.  Hypertension:  BP well in general, but  running up to 140/80 range on the Lisinopril and Amlodipine since developed back pain.  She has previously taken Metoprolol, but was stopped in 2015 as she was having exertional dyspnea and apparently concern for beta blockade and pulmonary function.  She has never had asthma.    5.  Right Low Back pain:  Started 6 months ago and was coming and going.  States she can feel a knot.  If she presses against a chair back, hurts more.   Past 3 days, she has been lifting items helping her friend move.   She turned and pain radiated around to front low abdomen.  Felt like a pulling sensation with radiation.   Has taken Ibuprofen 600 mg a few days ago when very  painful.  Helped, but did not take completely go away.  When she lies down to sleep, the pain does not bother her.  When she awakens, does not have the pain until she is up and around.    Current Meds  Medication Sig  . amLODipine (NORVASC) 5 MG tablet TAKE 1 TABLET BY MOUTH ONCE DAILY  . aspirin 81 MG tablet Take 81 mg by mouth daily.  . cetirizine (ZYRTEC) 10 MG tablet TAKE 1 TABLET BY MOUTH ONCE DAILY  . Cholecalciferol (VITAMIN D) 2000 units CAPS Take by mouth. 1 daily  . GARLIC PO Take 147 mg by mouth. 2 daily  . glimepiride (AMARYL) 2 MG tablet 1 1/2 tabs by mouth once daily with breakfast  . ibuprofen (ADVIL,MOTRIN) 200 MG tablet Take 600 mg by mouth every 6 (six) hours as needed for headache.  . lisinopril (PRINIVIL,ZESTRIL) 40 MG tablet TAKE 1 TABLET BY MOUTH ONCE DAILY  . MILK THISTLE PO Take by mouth daily.  . Misc Natural Products (TART CHERRY ADVANCED PO) Take by mouth daily.  . mometasone (NASONEX) 50 MCG/ACT nasal spray USE 2 SPRAY(S) IN EACH NOSTRIL ONCE DAILY  . Multiple Vitamins-Minerals (MULTIVITAMIN WITH MINERALS) tablet Take 1 tablet by mouth daily.  . nitroGLYCERIN (NITROSTAT) 0.4 MG  SL tablet 1 tab under tongue for chest pain.  May repeat every 5 minutes x 2 if chest pain not relieved  . omega-3 acid ethyl esters (LOVAZA) 1 g capsule Take 1 g by mouth 2 (two) times daily.  . vitamin C (ASCORBIC ACID) 500 MG tablet Take 500 mg by mouth daily.  Marland Kitchen. zinc gluconate 50 MG tablet Take 50 mg by mouth daily.    Allergies  Allergen Reactions  . Praluent [Alirocumab]     Had significant fatigue and memory loss--was lost driving in town.  Joint pain and nausea as well.  Marland Kitchen. Zetia [Ezetimibe] Other (See Comments)    Joint pain and ear ringing  . Atorvastatin Other (See Comments)    Pt states causes bilateral lower extremity muscle cramps  . Pravastatin Nausea And Vomiting and Other (See Comments)    Causes dizziness    Review of Systems     Objective:   Physical  Exam NAD Lungs:  CTA CV:  RRR without murmur or rub.  Radial and DP pulses normal and equal.   Abd:  S, NT, No HSM or mass, + BS Back:  NT over spinous processes.  Quite tender over right paraspinous musculature around to right mid flank.  LE:  No edema Neuro:  LE:  Motor 5/5, DTRs 2+/4, Light touch normal.    Diabetic Foot Exam - Simple   Simple Foot Form Diabetic Foot exam was performed with the following findings:  Yes 10/08/2018 10:14 AM  Visual Inspection No deformities, no ulcerations, no other skin breakdown bilaterally:  Yes Sensation Testing Intact to touch and monofilament testing bilaterally:  Yes Pulse Check Posterior Tibialis and Dorsalis pulse intact bilaterally:  Yes Comments        ECG:  No significant change from 06/2018.  Inferior ST/T wave changes Assessment & Plan:  1.  DM:  Sounds well controlled, but check A1C  2.  Chest pain/palpitations/CAD:  ECG.  Metoprolol 25 mg twice daily.  Referral back to cardiology.    3.  Hyperlipidemia:  Restart Rosuvastatin.  FLP in 6 weeks.  Watch for worsening tinnitus again.  4.  Tinnitus/hearing loss:  Not clear how much if any related to Rosuvastatin, which worked well for her.  To call if worsens.  Send for [facility] report.  5. Hypertension:  Adding Metoprolol   6.  Right low back pain:  Muscular.  Cyclobenzaprine at bedtime as needed.  PT referral.

## 2018-10-09 LAB — HGB A1C W/O EAG: HEMOGLOBIN A1C: 6.2 % — AB (ref 4.8–5.6)

## 2018-10-10 ENCOUNTER — Telehealth: Payer: Self-pay | Admitting: Internal Medicine

## 2018-10-10 NOTE — Telephone Encounter (Signed)
Spoke with Dr. Lindaann SloughNelson's office. Patient scheduled for OV on 10/16/18 @ 9:00 am. Patient to see PA Herma CarsonMichelle Lenze. Spoke with patient and informed of time and date. Patient verbalized understanding.

## 2018-10-10 NOTE — Telephone Encounter (Signed)
Called at time of closing last night:  10/09/2018 States she is not having any problems with regards to her bp meds. Just got started on Metoprolol Tuesday night. Climbed 3 flights of stairs today and did not have the dyspnea or chest pain. Not feeling weak or light headed Discussed to stay on her 2 bp meds plus Metoprolol for now and until at least she undergoes evaluation by Cardiology.

## 2018-10-11 ENCOUNTER — Encounter: Payer: Self-pay | Admitting: Physical Therapy

## 2018-10-11 ENCOUNTER — Ambulatory Visit: Payer: BLUE CROSS/BLUE SHIELD | Attending: Internal Medicine | Admitting: Physical Therapy

## 2018-10-11 DIAGNOSIS — M6281 Muscle weakness (generalized): Secondary | ICD-10-CM | POA: Diagnosis present

## 2018-10-11 DIAGNOSIS — G8929 Other chronic pain: Secondary | ICD-10-CM

## 2018-10-11 DIAGNOSIS — M545 Low back pain: Secondary | ICD-10-CM | POA: Diagnosis present

## 2018-10-11 NOTE — Therapy (Signed)
[facility] [address] [place], [place], [address] Phone: [phone]   Fax:  [phone]  Physical Therapy Evaluation  Patient Details  Name: Haley [name] MRN: [id number] Date of Birth: October 28, 1956 Referring Provider (PT): Julieanne Manson, MD   Encounter Date: 10/11/2018  PT End of Session - 10/11/18 1025    Visit Number  1    Number of Visits  12    Date for PT Re-Evaluation  11/22/18    Authorization Type  [employer]-30 visit limit    PT Start Time  1017    PT Stop Time  1112    PT Time Calculation (min)  55 min    Activity Tolerance  Patient tolerated treatment well    Behavior During Therapy  [facility] for tasks assessed/performed       Past Medical History:  Diagnosis Date  . Allergy   . Depression   . Diabetes mellitus without complication (HCC)   . Ganglion cyst of wrist, right 05/09/2017  . Hyperlipidemia   . Hypertension   . Shingles   . Stroke [facility])    per pt. report had stroke 10-15 years ago    Past Surgical History:  Procedure Laterality Date  . ABDOMINAL HYSTERECTOMY  1994   Laparoscopic  . CARDIAC CATHETERIZATION N/A 10/23/2016   Procedure: Left Heart Cath and Coronary Angiography;  Surgeon: Kathleene Hazel, MD;  Location: [facility] INVASIVE CV LAB;  Service: Cardiovascular;  Laterality: N/A;  . CHOLECYSTECTOMY  1997   Laparoscopic  . COLONOSCOPY  2005-at age 13   in [place]-polyps removed per pt   . WISDOM TOOTH EXTRACTION      There were no vitals filed for this visit.   Subjective Assessment - 10/11/18 1105    Subjective  Pt. reports 6 month history of right-sided lumbar pain. No overt mechanism of injury but notes soreness with picking up grandchild. She reports that it feels like she has a "knot" in her back. No radiating pain, parasthesias, or bowel/bladder changes noted other than some recent constipation. Pt. does also report that the right side of her body feels weaker than her left over  the past month. 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    Pertinent History  obesity, pt. reported remote history CVA    Limitations  Standing;Lifting;Walking    How long can you sit comfortably?  no limitations    How long can you stand comfortably?  45 minutes    How long can you walk comfortably?  30 minutes with difficulty    Patient Stated Goals  Get rid of back pain    Currently in Pain?  Yes    Pain Score  5     Pain Location  Back    Pain Orientation  Right;Lower    Pain Descriptors / Indicators  Dull    Pain Type  Chronic pain    Pain Onset  More than a month ago    Pain Frequency  Intermittent    Aggravating Factors   standing and walking, lifting activities    Pain Relieving Factors  rest    Effect of Pain on Daily Activities  limits tolerance standing and walking for IADLs, community ambulation, limits ability to pick up grandchild         [facility] PT Assessment - 10/11/18 0001      Assessment   Medical Diagnosis  Right muscular LBP without sciatica    Referring Provider (PT)  Julieanne Manson,  MD    Onset Date/Surgical Date  04/06/18   estimated per pt. report of 6 month history   Hand Dominance  Right    Prior Therapy  none      Precautions   Precautions  None      Restrictions   Weight Bearing Restrictions  No      Balance Screen   Has the patient fallen in the past 6 months  No      Home Environment   Living Environment  Private residence      Prior Function   Level of Independence  Independent with basic ADLs      Cognition   Overall Cognitive Status  Within Functional Limits for tasks assessed      Observation/Other Assessments   Focus on Therapeutic Outcomes (FOTO)   53% limited      Sensation   Additional Comments  Pt. reports each side feels "different" depending on the spot with LE dermatomal screen-no clear radicular pattern      ROM / Strength   AROM / PROM / Strength  AROM;Strength      AROM   AROM Assessment Site   Hip;Lumbar    Right/Left Hip  Right    Right Hip Flexion  80   increased LBP   Right Hip External Rotation   20   increased LBP   Right Hip Internal Rotation   20    Right Hip ABduction  40    Lumbar Flexion  70    Lumbar Extension  20    Lumbar - Right Side Bend  25   increased LBP   Lumbar - Left Side Bend  20    Lumbar - Right Rotation  20%    Lumbar - Left Rotation  70%      Strength   Overall Strength Comments  No UE strength deficits noted, bllat. UE grossly 5/5    Strength Assessment Site  Hip;Knee;Ankle    Right/Left Hip  Right;Left    Right Hip Flexion  4-/5    Right Hip External Rotation   4/5    Right Hip Internal Rotation  4/5    Left Hip Flexion  4/5    Left Hip External Rotation  5/5    Left Hip Internal Rotation  5/5    Right/Left Knee  Right;Left    Right Knee Flexion  4/5    Right Knee Extension  4/5    Left Knee Flexion  5/5    Left Knee Extension  5/5    Right/Left Ankle  Right;Left    Right Ankle Dorsiflexion  4-/5    Right Ankle Inversion  4-/5    Right Ankle Eversion  4-/5    Left Ankle Dorsiflexion  4/5    Left Ankle Inversion  4/5    Left Ankle Eversion  4/5      Flexibility   Soft Tissue Assessment /Muscle Length  yes    Hamstrings  SLR 50 deg right, 70 deg left with hamstring tightness and lumbar soreness on right>left    Piriformis  tightness on right      Palpation   Palpation comment  Tightness/TTP right lumbar longissimus L4-5 region      Special Tests   Other special tests  SLR (-)                Objective measurements completed on examination: See above findings.      [facility] Adult PT Treatment/Exercise - 10/11/18 0001  Exercises   Exercises  Lumbar      Lumbar Exercises: Stretches   Single Knee to Chest Stretch  --   brief HEP instruction/demo   Lower Trunk Rotation  --   brief HEP instruction/demo   Pelvic Tilt  --   instructed HEP with practice 5 reps     Modalities   Modalities  Moist Heat       Moist Heat Therapy   Number Minutes Moist Heat  10 Minutes    Moist Heat Location  Lumbar Spine       Trigger Point Dry Needling - 10/11/18 1139    Consent Given?  Yes    Education Handout Provided  Yes    Muscles Treated Upper Body  Longissimus   L4-5 region on right   Longissimus Response  Twitch response elicited           PT Education - 10/11/18 1139    Education Details  potential etiology symptoms, spine anatomy, POC, HEP, dry needling    Person(s) Educated  Patient    Methods  Explanation;Demonstration;Tactile cues;Handout    Comprehension  Verbalized understanding;Returned demonstration          PT Long Term Goals - 10/11/18 1147      PT LONG TERM GOAL #1   Title  Independent with HEP    Baseline  no HEP    Time  6    Period  Weeks    Status  New    Target Date  11/22/18      PT LONG TERM GOAL #2   Title  Improve FOTO score to 37% or less impairment due to LBP    Baseline  53% limited    Time  6    Period  Weeks    Status  New      PT LONG TERM GOAL #3   Title  Increase RLE strength grossly 1/2 MMT grade to improve ability for stair navigation and chores at home    Baseline  grossly 4-/5 to 4/5    Time  6    Period  Weeks    Status  New    Target Date  11/22/18      PT LONG TERM GOAL #4   Title  Tolerate standing/walking periods 30-40 min or greater for chores, IADLs, grocery shopping with LBP 2/10 or less    Baseline  5/10 pain with limited tolerance for these activities    Time  6    Period  Weeks    Status  New    Target Date  11/22/18      PT LONG TERM GOAL #5   Title  Return demos proper body mechanics for logrolling and lifting technique    Baseline  needs cues    Time  6    Period  Weeks    Status  New    Target Date  11/22/18             Plan - 10/11/18 1140    Clinical Impression Statement  Pt. presents with 6 month history right-sided lumbar pain symptoms. Suspect predominately muscular etiology given local tenderness  to palpation/symptom reproduction in paraspinals. No radicular pain but pt. does presents with right LE weakness as well as some reported altered sensation side to side with dermatomal screen though no clear radicular pattern with this. Pt. also reported UE weakness but unable to detect with MMTs. Will continue to monitor these symptoms and pt. to seek immediate medical  attention if having any signs/symptoms of stroke. Pt. would benefit from PT to address current associated functional limitations,    History and Personal Factors relevant to plan of care:  obesity, leg weakness/altered sensation    Clinical Presentation  Evolving    Clinical Presentation due to:  leg weakness and altered sensation/unclear if potential radicular component though overall symptoms seem muscular    Clinical Decision Making  Moderate    Rehab Potential  Good    Clinical Impairments Affecting Rehab Potential  expect good potential to address local lumbar symptoms though still unclear etiology leg weakness, parasthesias    PT Frequency  2x / week    PT Duration  6 weeks    PT Treatment/Interventions  ADLs/Self Care Home Management;Electrical Stimulation;Cryotherapy;Moist Heat;Neuromuscular re-education;Patient/family education;Dry needling;Manual techniques;Therapeutic activities;Therapeutic exercise    PT Next Visit Plan  Check response to dry needling paraspinals and continue as needed, lumbar ROM/stretching/early stabilization as tolerated, progress/update HEP as needed    PT Home Exercise Plan  pelvic tilts, SKTC, LTR    Consulted and Agree with Plan of Care  Patient       Patient will benefit from skilled therapeutic intervention in order to improve the following deficits and impairments:  Pain, Improper body mechanics, Decreased activity tolerance, Obesity, Impaired flexibility, Difficulty walking, Decreased strength, Decreased range of motion, Decreased endurance, Increased muscle spasms, Impaired sensation  Visit  Diagnosis: Chronic right-sided low back pain without sciatica  Muscle weakness (generalized)     Problem List Patient Active Problem List   Diagnosis Date Noted  . Cutaneous abscess of abdominal wall 06/25/2018  . Ganglion cyst of wrist, right 05/09/2017  . Coronary artery disease involving native coronary artery of native heart without angina pectoris   . Abnormal screening cardiac CT 10/16/2016  . Hyperlipidemia 08/03/2016  . Chest pain 08/03/2016  . Onychomycosis of toenail 04/18/2016  . GERD (gastroesophageal reflux disease) 02/27/2016  . Depression 02/27/2016  . Palpitation 03/19/2014  . Dyspnea on exertion 03/19/2014  . Fatigue 03/19/2014  . Essential hypertension 07/22/2013  . Obesity 07/22/2013  . Diabetes mellitus without complication (HCC) 07/22/2013  . Allergic rhinitis 07/22/2013    Lazarus Gowda, PT, DPT 10/11/18 11:54 AM  [facility] [address] [place], [place], [address] Phone: [phone]   Fax:  [phone]  Name: Haley [name] MRN: [id number] Date of Birth: Jun 11, 1956

## 2018-10-11 NOTE — Patient Instructions (Signed)

## 2018-10-15 NOTE — Progress Notes (Signed)
Cardiology Office Note    Date:  10/16/2018   ID:  Haley [name], DOB Mar 20, 1956, MRN [id number]  PCP:  Mack Hook, MD  Cardiologist: Ena Dawley, MD EPS: None  Chief Complaint  Patient presents with  . Chest Pain    History of Present Illness:  Haley [name] is a 62 y.o. female with history of coronary CTA in 2017 with calcium score of 239 which was 93 percentile, moderate diffuse CAD possible obstructive.  Cardiac catheterization 10/2016 mild nonobstructive disease in the LAD, moderate stenosis in the mid to distal PDA 50% normal LV function medical therapy recommended.  Has been on Praluent managed by lipid clinic.  Patient last saw Dr. Meda Coffee 11/2016 which time she was doing well.  48-hour monitor showed PVC's in past.  Patient also has hypertension.  Patient started having chest tightness when climbing stairs about 1 month ago. Associated with shortness of breath and heart racing. It now happens when walking about 15 min and has pushed through to complete walk 30 min.Has used about 6 NTG in a month with relief.  Now when sitting still heart is skipping and has shortness of breath. Had stopped rosuvastatin for 5 weeks because of ringing in ears but it didn't stop so she restarted it.  She was started on metoprolol 25 mg twice daily by her PCP and her symptoms have improved some.  She says she can walk up or 3 flights of stairs without getting chest pain.  If she is carrying any bags she has to stop on the second floor to catch her breath.  Palpitations have lessened as well.  Thyroid was checked in September and was normal.    Past Medical History:  Diagnosis Date  . Allergy   . Depression   . Diabetes mellitus without complication ([place])   . Ganglion cyst of wrist, right 05/09/2017  . Hyperlipidemia   . Hypertension   . Shingles   . Stroke [facility])    per pt. report had stroke 10-15 years ago    Past Surgical History:  Procedure Laterality Date  . ABDOMINAL  HYSTERECTOMY  1994   Laparoscopic  . CARDIAC CATHETERIZATION N/A 10/23/2016   Procedure: Left Heart Cath and Coronary Angiography;  Surgeon: Burnell Blanks, MD;  Location: [facility];  Service: Cardiovascular;  Laterality: N/A;  . CHOLECYSTECTOMY  1997   Laparoscopic  . COLONOSCOPY  2005-at age 49   in [place]-polyps removed per pt   . WISDOM TOOTH EXTRACTION      Current Medications: No outpatient medications have been marked as taking for the 10/16/18 encounter (Office Visit) with Imogene Burn, PA-C.     Allergies:   Praluent [alirocumab]; Zetia [ezetimibe]; Atorvastatin; and Pravastatin   Social History   Socioeconomic History  . Marital status: Single    Spouse name: Not on file  . Number of children: 2  . Years of education: 12+  . Highest education level: Not on file  Occupational History  . 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 Financial resource strain: Not very hard  . Food insecurity:    Worry: Never true    Inability: Never true  . Transportation needs:    Medical: No    Non-medical: No  Tobacco Use  . Smoking status: Never Smoker  . Smokeless tobacco: Never Used  Substance and Sexual Activity  . Alcohol use: Yes    Alcohol/week: 0.0 standard drinks  Comment: occasionally-1 to 2 drinks monthly   . Drug use: No  . Sexual activity: Never  Lifestyle  . Physical activity:    Days per week: 3 days    Minutes per session: 30 min  . Stress: Only a little  Relationships  . Social connections:    Talks on phone: More than three times a week    Gets together: More than three times a week    Attends religious service: 1 to 4 times per year    Active member of club or organization: No    Attends meetings of clubs or organizations: Never    Relationship status: Divorced  Other Topics Concern  . Not on file  Social History Narrative   Did get some post high school training/education   Lives alone      Family History:  The patient's family history includes Cancer (age of onset: 13) in her sister; Diabetes in her brother, father, sister, and son; Hypertension in her brother and son; Seizures in her brother; Stroke in her maternal aunt.   ROS:   Please see the history of present illness.    Review of Systems  Constitution: Negative.  HENT: Negative.   Eyes: Negative.   Cardiovascular: Positive for chest pain, dyspnea on exertion and palpitations.  Respiratory: Positive for snoring and wheezing.   Hematologic/Lymphatic: Negative.   Musculoskeletal: Positive for back pain and myalgias. Negative for joint pain.  Gastrointestinal: Positive for constipation.  Genitourinary: Negative.   Neurological: Negative.    All other systems reviewed and are negative.   PHYSICAL EXAM:   VS:  BP (!) 148/86   Pulse 70   Ht '5\' 1"'$  (1.549 m)   Wt 232 lb 6.4 oz (105.4 kg)   SpO2 98%   BMI 43.91 kg/m   Physical Exam  GEN: Obese, in no acute distress  Neck: no JVD, carotid bruits, or masses Cardiac:RRR; no murmurs, rubs, or gallops  Respiratory:  clear to auscultation bilaterally, normal work of breathing GI: soft, nontender, nondistended, + BS Ext: without cyanosis, clubbing, or edema, Good distal pulses bilaterally Neuro:  Alert and Oriented x 3 Psych: euthymic mood, full affect  Wt Readings from Last 3 Encounters:  10/16/18 232 lb 6.4 oz (105.4 kg)  10/08/18 230 lb (104.3 kg)  07/09/18 231 lb (104.8 kg)      Studies/Labs Reviewed:   EKG:  EKG is not ordered today.  The ekg reviewed from 10/08/2018 normal sinus rhythm nonspecific ST-T wave changes, no acute change Recent Labs: 06/28/2018: Hemoglobin 13.0; Platelets 361 07/23/2018: ALT 35; BUN 11; Creatinine, Ser 1.09; Potassium 4.0; Sodium 141; TSH 3.060   Lipid Panel    Component Value Date/Time   CHOL 119 07/23/2018 0936   TRIG 88 07/23/2018 0936   HDL 46 07/23/2018 0936   LDLCALC 55 07/23/2018 0936   LDLDIRECT 134 (H) 07/22/2013  1620    Additional studies/ records that were reviewed today include:  Coronary CTA 11/2017IMPRESSION: 1. Coronary calcium score of 239. This was 77 percentile for age and sex matched control.   2. Normal coronary origin with right dominance.   3. 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 We will send additional analysis for CT FFT evaluation once available.   4. Mildly dilated pulmonary artery measuring 31 x 27 mm suspicious for pulmonary hypertension.   Ena Dawley     Electronically Signed   By: Ena Dawley   On: 08/15/2016 21:18    Cardiac catheterization 12/2017The left ventricular systolic function  is normal.  LV end diastolic pressure is normal.  The left ventricular ejection fraction is greater than 65% by visual estimate.  There is no mitral valve regurgitation.  RPDA lesion, 50 %stenosed.  Ost LAD to Prox LAD lesion, 20 %stenosed.   1. Mild non-obstructive disease in the LAD 2. Moderate stenosis in the mid to distal segment of the moderate caliber posterior descending artery. The vessel becomes small in caliber just after the stenosis. This does not appear to be flow limiting.  3. Normal LV systolic function   Recommendations: Medical management of CAD.      ASSESSMENT:    1. Coronary artery disease involving native coronary artery of native heart without angina pectoris   2. Chest pain, unspecified type   3. Essential hypertension   4. Hyperlipidemia, unspecified hyperlipidemia type   5. Palpitation   6. Class 3 severe obesity due to excess calories with serious comorbidity and body mass index (BMI) of 40.0 to 44.9 in adult ([place])   7. Diabetes mellitus without complication ([place])      PLAN:  In order of problems listed above:  CAD status post cath showed nonobstructive CAD 10/2016 medical therapy recommended, now with increased exertional chest pain and dyspnea on exertion.  Will proceed with exercise Myoview.  Essential hypertension  blood pressure fairly well controlled on metoprolol, lisinopril and amlodipine  Hyperlipidemia LDL was 55 07/2018 on Crestor  Palpitations have improved some since metoprolol added but still having some.  Previous monitor in the remote past showed PVCs.  Will place a 30-day monitor.  Check be met and magnesium.  Thyroid was normal in September.  Obesity BMI 43.  Weight loss recommended  Diabetes mellitus hemoglobin A1c was 6.2, treated by PCP Medication Adjustments/Labs and Tests Ordered: Current medicines are reviewed at length with the patient today.  Concerns regarding medicines are outlined above.  Medication changes, Labs and Tests ordered today are listed in the Patient Instructions below. Patient Instructions  Medication Instructions:  Your physician recommends that you continue on your current medications as directed. Please refer to the Current Medication list given to you today.   If you need a refill on your cardiac medications before your next appointment, please call your pharmacy.   Lab work: TODAY:  BMET, CBC, & MAG If you have labs (blood work) drawn today and your tests are completely normal, you will receive your results only by: Marland Kitchen MyChart Message (if you have MyChart) OR . A paper copy in the mail If you have any lab test that is abnormal or we need to change your treatment, we will call you to review the results.  Testing/Procedures: Your physician has requested that you have en exercise stress myoview. For further information please visit [email or web address]. Please follow instruction sheet, as given.  Your physician has recommended that you wear an event monitor. Event monitors are medical devices that record the heart's electrical activity. Doctors most often [place] these monitors to diagnose arrhythmias. Arrhythmias are problems with the speed or rhythm of the heartbeat. The monitor is a small, portable device. You can wear one while you do your normal daily activities.  This is usually used to diagnose what is causing palpitations/syncope (passing out).    Follow-Up: Your physician recommends that you schedule a follow-up appointment in: [place], PA-C  Any Other Special Instructions Will Be Listed Below (If Applicable).  Exercise Stress Electrocardiogram An exercise stress electrocardiogram is a test to check how blood flows to  your heart. It is done to find areas of poor blood flow. You will need to walk on a treadmill for this test. The electrocardiogram will record your heartbeat when you are at rest and when you are exercising. What happens before the procedure?  Do not have drinks with caffeine or foods with caffeine for 24 hours before the test, or as told by your doctor. This includes coffee, tea (even decaf tea), sodas, chocolate, and cocoa.  Follow your doctor's instructions about eating and drinking before the test.  Ask your doctor what medicines you should or should not take before the test. Take your medicines with water unless told by your doctor not to.  If you use an inhaler, bring it with you to the test.  Bring a snack to eat after the test.  Do not  smoke for 4 hours before the test.  Do not put lotions, powders, creams, or oils on your chest before the test.  Wear comfortable shoes and clothing. What happens during the procedure?  You will have patches put on your chest. Small areas of your chest may need to be shaved. Wires will be connected to the patches.  Your heart rate will be watched while you are resting and while you are exercising.  You will walk on the treadmill. The treadmill will slowly get faster to raise your heart rate.  The test will take about 1-2 hours. What happens after the procedure?  Your heart rate and blood pressure will be watched after the test.  You may return to your normal diet, activities, and medicines or as told by your doctor. This information is not intended to  replace advice given to you by your health care provider. Make sure you discuss any questions you have with your health care provider. Document Released: 04/10/2008 Document Revised: 06/21/2016 Document Reviewed: 06/30/2013 Elsevier Interactive Patient Education  2018 [employer].    Cardiac Event Monitoring A cardiac event monitor is a small recording device that is used to detect abnormal heart rhythms (arrhythmias). The monitor is used to record your heart rhythm when you have symptoms, such as:  Fast heartbeats (palpitations), such as heart racing or fluttering.  Dizziness.  Fainting or light-headedness.  Unexplained weakness.  Some monitors are wired to electrodes placed on your chest. Electrodes are flat, sticky disks that attach to your skin. Other monitors may be hand-held or worn on the wrist. The monitor can be worn for up to 30 days. If the monitor is attached to your chest, a technician will prepare your chest for the electrode placement and show you how to work the monitor. Take time to practice using the monitor before you leave the office. Make sure you understand how to send the information from the monitor to your health care provider. In some cases, you may need to use a landline telephone instead of a cell phone. What are the risks? Generally, this device is safe to use, but it possible that the skin under the electrodes will become irritated. How to use your cardiac event monitor  Wear your monitor at all times, except when you are in water: ? Do not let the monitor get wet. ? Take the monitor off when you bathe. Do not swim or use a hot tub with it on.  Keep your skin clean. Do not put body lotion or moisturizer on your chest.  Change the electrodes as told by your health care provider or any time they stop sticking to your  skin. You may need to use medical tape to keep them on.  Try to put the electrodes in slightly different places on your chest to help prevent  skin irritation. They must remain in the area under your left breast and in the upper right section of your chest.  Make sure the monitor is safely clipped to your clothing or in a location close to your body that your health care provider recommends.  Press the button to record as soon as you feel heart-related symptoms, such as: ? Dizziness. ? Weakness. ? Light-headedness. ? Palpitations. ? Thumping or pounding in your chest. ? Shortness of breath. ? Unexplained weakness.  Keep a diary of your activities, such as walking, doing chores, and taking medicine. It is very important to note what you were doing when you pushed the button to record your symptoms. This will help your health care provider determine what might be contributing to your symptoms.  Send the recorded information as recommended by your health care provider. It may take some time for your health care provider to process the results.  Change the batteries as told by your health care provider.  Keep electronic devices away from your monitor. This includes: ? Tablets. ? MP3 players. ? Cell phones.  While wearing your monitor you should avoid: ? Electric blankets. ? Armed forces operational officer. ? Electric toothbrushes. ? Microwave ovens. ? Magnets. ? Metal detectors. Get help right away if:  You have chest pain.  You have extreme difficulty breathing or shortness of breath.  You develop a very fast heartbeat that persists.  You develop dizziness that does not go away.  You faint or constantly feel like you are about to faint. Summary  A cardiac event monitor is a small recording device that is used to help detect abnormal heart rhythms (arrhythmias).  The monitor is used to record your heart rhythm when you have heart-related symptoms.  Make sure you understand how to send the information from the monitor to your health care provider.  It is important to press the button on the monitor when you have any  heart-related symptoms.  Keep a diary of your activities, such as walking, doing chores, and taking medicine. It is very important to note what you were doing when you pushed the button to record your symptoms. This will help your health care provider learn what might be causing your symptoms. This information is not intended to replace advice given to you by your health care provider. Make sure you discuss any questions you have with your health care provider. Document Released: 08/01/2008 Document Revised: 10/07/2016 Document Reviewed: 10/07/2016 Elsevier Interactive Patient Education  2017 [place], Ermalinda Barrios, [place]  10/16/2018 9:43 AM    [facility] [address], [place], [place]  [address] Phone: [phone]; Fax: [phone]

## 2018-10-16 ENCOUNTER — Ambulatory Visit (INDEPENDENT_AMBULATORY_CARE_PROVIDER_SITE_OTHER): Payer: BLUE CROSS/BLUE SHIELD

## 2018-10-16 ENCOUNTER — Ambulatory Visit: Payer: BLUE CROSS/BLUE SHIELD | Admitting: Physician Assistant

## 2018-10-16 ENCOUNTER — Encounter: Payer: Self-pay | Admitting: Physician Assistant

## 2018-10-16 ENCOUNTER — Encounter: Payer: Self-pay | Admitting: *Deleted

## 2018-10-16 VITALS — BP 148/86 | HR 70 | Ht 61.0 in | Wt 232.4 lb

## 2018-10-16 DIAGNOSIS — I10 Essential (primary) hypertension: Secondary | ICD-10-CM | POA: Diagnosis not present

## 2018-10-16 DIAGNOSIS — R002 Palpitations: Secondary | ICD-10-CM

## 2018-10-16 DIAGNOSIS — I2510 Atherosclerotic heart disease of native coronary artery without angina pectoris: Secondary | ICD-10-CM | POA: Diagnosis not present

## 2018-10-16 DIAGNOSIS — E785 Hyperlipidemia, unspecified: Secondary | ICD-10-CM

## 2018-10-16 DIAGNOSIS — R079 Chest pain, unspecified: Secondary | ICD-10-CM

## 2018-10-16 DIAGNOSIS — Z6841 Body Mass Index (BMI) 40.0 and over, adult: Secondary | ICD-10-CM

## 2018-10-16 DIAGNOSIS — E119 Type 2 diabetes mellitus without complications: Secondary | ICD-10-CM

## 2018-10-16 LAB — BASIC METABOLIC PANEL
BUN/Creatinine Ratio: 12 (ref 12–28)
BUN: 14 mg/dL (ref 8–27)
CO2: 26 mmol/L (ref 20–29)
Calcium: 9.7 mg/dL (ref 8.7–10.3)
Chloride: 103 mmol/L (ref 96–106)
Creatinine, Ser: 1.14 mg/dL — ABNORMAL HIGH (ref 0.57–1.00)
GFR calc non Af Amer: 51 mL/min/{1.73_m2} — ABNORMAL LOW (ref >59)
GFR, EST AFRICAN AMERICAN: 59 mL/min/{1.73_m2} — AB (ref >59)
Glucose: 124 mg/dL — ABNORMAL HIGH (ref 65–99)
Potassium: 4.4 mmol/L (ref 3.5–5.2)
Sodium: 141 mmol/L (ref 134–144)

## 2018-10-16 LAB — CBC
Hematocrit: 41 % (ref 34.0–46.6)
Hemoglobin: 14 g/dL (ref 11.1–15.9)
MCH: 31.5 pg (ref 26.6–33.0)
MCHC: 34.1 g/dL (ref 31.5–35.7)
MCV: 92 fL (ref 79–97)
Platelets: 316 10*3/uL (ref 150–450)
RBC: 4.45 x10E6/uL (ref 3.77–5.28)
RDW: 12.1 % — AB (ref 12.3–15.4)
WBC: 5.6 10*3/uL (ref 3.4–10.8)

## 2018-10-16 LAB — MAGNESIUM: Magnesium: 1.9 mg/dL (ref 1.6–2.3)

## 2018-10-16 NOTE — Patient Instructions (Addendum)
Medication Instructions:  Your physician recommends that you continue on your current medications as directed. Please refer to the Current Medication list given to you today.   If you need a refill on your cardiac medications before your next appointment, please call your pharmacy.   Lab work: TODAY:  BMET, CBC, & MAG If you have labs (blood work) drawn today and your tests are completely normal, you will receive your results only by: Marland Kitchen. MyChart Message (if you have MyChart) OR . A paper copy in the mail If you have any lab test that is abnormal or we need to change your treatment, we will call you to review the results.  Testing/Procedures: Your physician has requested that you have en exercise stress myoview. For further information please visit [email or web address]. Please follow instruction sheet, as given.  Your physician has recommended that you wear an event monitor. Event monitors are medical devices that record the heart's electrical activity. Doctors most often us these monitors to diagnose arrhythmias. Arrhythmias are problems with the speed or rhythm of the heartbeat. The monitor is a small, portable device. You can wear one while you do your normal daily activities. This is usually used to diagnose what is causing palpitations/syncope (passing out).    Follow-Up: Your physician recommends that you schedule a follow-up appointment in: 6 WEEKS WITH MICHELE LENZE, PA-C  Any Other Special Instructions Will Be Listed Below (If Applicable).  Exercise Stress Electrocardiogram An exercise stress electrocardiogram is a test to check how blood flows to your heart. It is done to find areas of poor blood flow. You will need to walk on a treadmill for this test. The electrocardiogram will record your heartbeat when you are at rest and when you are exercising. What happens before the procedure?  Do not have drinks with caffeine or foods with caffeine for 24 hours before the test, or as told  by your doctor. This includes coffee, tea (even decaf tea), sodas, chocolate, and cocoa.  Follow your doctor's instructions about eating and drinking before the test.  Ask your doctor what medicines you should or should not take before the test. Take your medicines with water unless told by your doctor not to.  If you use an inhaler, bring it with you to the test.  Bring a snack to eat after the test.  Do not  smoke for 4 hours before the test.  Do not put lotions, powders, creams, or oils on your chest before the test.  Wear comfortable shoes and clothing. What happens during the procedure?  You will have patches put on your chest. Small areas of your chest may need to be shaved. Wires will be connected to the patches.  Your heart rate will be watched while you are resting and while you are exercising.  You will walk on the treadmill. The treadmill will slowly get faster to raise your heart rate.  The test will take about 1-2 hours. What happens after the procedure?  Your heart rate and blood pressure will be watched after the test.  You may return to your normal diet, activities, and medicines or as told by your doctor. This information is not intended to replace advice given to you by your health care provider. Make sure you discuss any questions you have with your health care provider. Document Released: 04/10/2008 Document Revised: 06/21/2016 Document Reviewed: 06/30/2013 Elsevier Interactive Patient Education  2018 [employer]    Cardiac Event Monitoring A cardiac event monitor is a small recording  device that is used to detect abnormal heart rhythms (arrhythmias). The monitor is used to record your heart rhythm when you have symptoms, such as:  Fast heartbeats (palpitations), such as heart racing or fluttering.  Dizziness.  Fainting or light-headedness.  Unexplained weakness.  Some monitors are wired to electrodes placed on your chest. Electrodes are flat,  sticky disks that attach to your skin. Other monitors may be hand-held or worn on the wrist. The monitor can be worn for up to 30 days. If the monitor is attached to your chest, a technician will prepare your chest for the electrode placement and show you how to work the monitor. Take time to practice using the monitor before you leave the office. Make sure you understand how to send the information from the monitor to your health care provider. In some cases, you may need to use a landline telephone instead of a cell phone. What are the risks? Generally, this device is safe to use, but it possible that the skin under the electrodes will become irritated. How to use your cardiac event monitor  Wear your monitor at all times, except when you are in water: ? Do not let the monitor get wet. ? Take the monitor off when you bathe. Do not swim or use a hot tub with it on.  Keep your skin clean. Do not put body lotion or moisturizer on your chest.  Change the electrodes as told by your health care provider or any time they stop sticking to your skin. You may need to use medical tape to keep them on.  Try to put the electrodes in slightly different places on your chest to help prevent skin irritation. They must remain in the area under your left breast and in the upper right section of your chest.  Make sure the monitor is safely clipped to your clothing or in a location close to your body that your health care provider recommends.  Press the button to record as soon as you feel heart-related symptoms, such as: ? Dizziness. ? Weakness. ? Light-headedness. ? Palpitations. ? Thumping or pounding in your chest. ? Shortness of breath. ? Unexplained weakness.  Keep a diary of your activities, such as walking, doing chores, and taking medicine. It is very important to note what you were doing when you pushed the button to record your symptoms. This will help your health care provider determine what  might be contributing to your symptoms.  Send the recorded information as recommended by your health care provider. It may take some time for your health care provider to process the results.  Change the batteries as told by your health care provider.  Keep electronic devices away from your monitor. This includes: ? Tablets. ? MP3 players. ? Cell phones.  While wearing your monitor you should avoid: ? Electric blankets. ? Firefighter. ? Electric toothbrushes. ? Microwave ovens. ? Magnets. ? Metal detectors. Get help right away if:  You have chest pain.  You have extreme difficulty breathing or shortness of breath.  You develop a very fast heartbeat that persists.  You develop dizziness that does not go away.  You faint or constantly feel like you are about to faint. Summary  A cardiac event monitor is a small recording device that is used to help detect abnormal heart rhythms (arrhythmias).  The monitor is used to record your heart rhythm when you have heart-related symptoms.  Make sure you understand how to send the information from the  monitor to your health care provider.  It is important to press the button on the monitor when you have any heart-related symptoms.  Keep a diary of your activities, such as walking, doing chores, and taking medicine. It is very important to note what you were doing when you pushed the button to record your symptoms. This will help your health care provider learn what might be causing your symptoms. This information is not intended to replace advice given to you by your health care provider. Make sure you discuss any questions you have with your health care provider. Document Released: 08/01/2008 Document Revised: 10/07/2016 Document Reviewed: 10/07/2016 Elsevier Interactive Patient Education  2017 [employer].

## 2018-10-17 ENCOUNTER — Ambulatory Visit: Payer: BLUE CROSS/BLUE SHIELD | Admitting: Physical Therapy

## 2018-10-17 ENCOUNTER — Encounter: Payer: Self-pay | Admitting: Physical Therapy

## 2018-10-17 DIAGNOSIS — M545 Low back pain: Secondary | ICD-10-CM | POA: Diagnosis not present

## 2018-10-17 DIAGNOSIS — M6281 Muscle weakness (generalized): Secondary | ICD-10-CM

## 2018-10-17 DIAGNOSIS — G8929 Other chronic pain: Secondary | ICD-10-CM

## 2018-10-17 NOTE — Therapy (Signed)
[facility] [address] [place], [place], [address] Phone: [phone]   Fax:  [phone]  Physical Therapy Treatment  Patient Details  Name: Haley [name] MRN: [id number] Date of Birth: 08/04/1956 Referring Provider (PT): Julieanne Manson, MD   Encounter Date: 10/17/2018  PT End of Session - 10/17/18 1448    Visit Number  2    Number of Visits  12    Date for PT Re-Evaluation  11/22/18    Authorization Type  BCBS-30 visit limit    PT Start Time  1410    PT Stop Time  1454    PT Time Calculation (min)  44 min    Activity Tolerance  Patient tolerated treatment well    Behavior During Therapy  [facility] for tasks assessed/performed       Past Medical History:  Diagnosis Date  . Allergy   . Depression   . Diabetes mellitus without complication (HCC)   . Ganglion cyst of wrist, right 05/09/2017  . Hyperlipidemia   . Hypertension   . Shingles   . Stroke [facility])    per pt. report had stroke 10-15 years ago    Past Surgical History:  Procedure Laterality Date  . ABDOMINAL HYSTERECTOMY  1994   Laparoscopic  . CARDIAC CATHETERIZATION N/A 10/23/2016   Procedure: Left Heart Cath and Coronary Angiography;  Surgeon: Kathleene Hazel, MD;  Location: [facility] INVASIVE CV LAB;  Service: Cardiovascular;  Laterality: N/A;  . CHOLECYSTECTOMY  1997   Laparoscopic  . COLONOSCOPY  2005-at age 89   in [place]-polyps removed per pt   . WISDOM TOOTH EXTRACTION      There were no vitals filed for this visit.  Subjective Assessment - 10/17/18 1411    Subjective  "Much better than when I came in the first time". Minimal soreness the day of last of last session or day after but more sore 2 days later. Still with pain but not as severe as at eval.    Pain Score  6     Pain Location  Back    Pain Orientation  Right;Lower    Pain Descriptors / Indicators  Dull    Pain Type  Chronic pain    Pain Onset  More than a month ago    Pain  Frequency  Intermittent    Aggravating Factors   standing and walking, lifting    Pain Relieving Factors  rest, sitting    Effect of Pain on Daily Activities  limits tolerance standing and walking for IADLs                       [facility] Adult PT Treatment/Exercise - 10/17/18 0001      Exercises   Exercises  Lumbar;Knee/Hip      Lumbar Exercises: Stretches   Passive Hamstring Stretch  Right;Left;3 reps;30 seconds    Double Knee to Chest Stretch  --   5 sec x 15 reps AAROM with 65 cm P-ball   Lower Trunk Rotation  --   x 10 emphasis toward left side   Pelvic Tilt  15 reps      Lumbar Exercises: Supine   Glut Set  15 reps    Clam  15 reps    Clam Limitations  Red Theraband    Bent Knee Raise  10 reps    Other Supine Lumbar Exercises  hip add. isometric with ball x 15 reps      Lumbar  Exercises: Sidelying   Other Sidelying Lumbar Exercises  manual R QL stretch 30 sec x 3      Manual Therapy   Manual Therapy  Soft tissue mobilization    Soft tissue mobilization  R lumbar paraspinals in left sidelying       Trigger Point Dry Needling - 10/17/18 1447    Consent Given?  Yes    Muscles Treated Upper Body  Longissimus   Right lumbar paraspinals L3-5 region, x 5 min total          PT Education - 10/17/18 1448    Education Details  POC, HEP    Person(s) Educated  Patient    Methods  Explanation    Comprehension  Verbalized understanding;Returned demonstration          PT Long Term Goals - 10/11/18 1147      PT LONG TERM GOAL #1   Title  Independent with HEP    Baseline  no HEP    Time  6    Period  Weeks    Status  New    Target Date  11/22/18      PT LONG TERM GOAL #2   Title  Improve FOTO score to 37% or less impairment due to LBP    Baseline  53% limited    Time  6    Period  Weeks    Status  New      PT LONG TERM GOAL #3   Title  Increase RLE strength grossly 1/2 MMT grade to improve ability for stair navigation and chores at home     Baseline  grossly 4-/5 to 4/5    Time  6    Period  Weeks    Status  New    Target Date  11/22/18      PT LONG TERM GOAL #4   Title  Tolerate standing/walking periods 30-40 min or greater for chores, IADLs, grocery shopping with LBP 2/10 or less    Baseline  5/10 pain with limited tolerance for these activities    Time  6    Period  Weeks    Status  New    Target Date  11/22/18      PT LONG TERM GOAL #5   Title  Return demos proper body mechanics for logrolling and lifting technique    Baseline  needs cues    Time  6    Period  Weeks    Status  New    Target Date  11/22/18            Plan - 10/17/18 1450    Clinical Impression Statement  Good response to initial tx. to decrease right lumbar muscle spasm. Pt. less guarded with movement today for bed/mat mobility and lumbar + hip ROM. Mild soreness with LTR otherwise session well-tolerated and expect pt. will continue to progress toward therapy goals from eval.    PT Frequency  2x / week    PT Duration  6 weeks    PT Treatment/Interventions  ADLs/Self Care Home Management;Electrical Stimulation;Cryotherapy;Moist Heat;Neuromuscular re-education;Patient/family education;Dry needling;Manual techniques;Therapeutic activities;Therapeutic exercise    PT Next Visit Plan  Check response to dry needling paraspinals and continue as needed, lumbar ROM/stretching/early stabilization as tolerated, progress/update HEP as needed    PT Home Exercise Plan  pelvic tilts, SKTC, LTR    Consulted and Agree with Plan of Care  Patient       Patient will benefit from skilled therapeutic intervention in  order to improve the following deficits and impairments:  Pain, Improper body mechanics, Decreased activity tolerance, Obesity, Impaired flexibility, Difficulty walking, Decreased strength, Decreased range of motion, Decreased endurance, Increased muscle spasms, Impaired sensation  Visit Diagnosis: Chronic right-sided low back pain without  sciatica  Muscle weakness (generalized)     Problem List Patient Active Problem List   Diagnosis Date Noted  . Cutaneous abscess of abdominal wall 06/25/2018  . Ganglion cyst of wrist, right 05/09/2017  . Coronary artery disease involving native coronary artery of native heart without angina pectoris   . Abnormal screening cardiac CT 10/16/2016  . Hyperlipidemia 08/03/2016  . Chest pain 08/03/2016  . Onychomycosis of toenail 04/18/2016  . GERD (gastroesophageal reflux disease) 02/27/2016  . Depression 02/27/2016  . Palpitation 03/19/2014  . Dyspnea on exertion 03/19/2014  . Fatigue 03/19/2014  . Essential hypertension 07/22/2013  . Obesity 07/22/2013  . Diabetes mellitus without complication (HCC) 07/22/2013  . Allergic rhinitis 07/22/2013    Lazarus Gowda, PT, DPT 10/17/18 3:02 PM  [facility] [address] [place], [place], [address] Phone: [phone]   Fax:  [phone]  Name: Haley [name] MRN: [id number] Date of Birth: 1956/10/24

## 2018-10-23 ENCOUNTER — Encounter: Payer: Self-pay | Admitting: Physical Therapy

## 2018-10-23 ENCOUNTER — Ambulatory Visit: Payer: BLUE CROSS/BLUE SHIELD | Admitting: Physical Therapy

## 2018-10-23 DIAGNOSIS — M6281 Muscle weakness (generalized): Secondary | ICD-10-CM

## 2018-10-23 DIAGNOSIS — M545 Low back pain: Secondary | ICD-10-CM | POA: Diagnosis not present

## 2018-10-23 DIAGNOSIS — G8929 Other chronic pain: Secondary | ICD-10-CM

## 2018-10-23 NOTE — Therapy (Signed)
Haley [name], [place], [address] Phone: [phone]   Fax:  [phone]  Physical Therapy Treatment  Patient Details  Name: Haley [name] MRN: [id number] Date of Birth: Nov 21, 1955 Referring Provider (PT): Mack Hook, MD   Encounter Date: 10/23/2018  PT End of Session - 10/23/18 1122    Visit Number  3    Number of Visits  12    Date for PT Re-Evaluation  11/22/18    Authorization Type  BCBS-30 visit limit    PT Start Time  1100    PT Stop Time  1156    PT Time Calculation (min)  56 min    Activity Tolerance  Patient tolerated treatment well    Behavior During Therapy  [facility] for tasks assessed/performed       Past Medical History:  Diagnosis Date  . Allergy   . Depression   . Diabetes mellitus without complication ([place])   . Ganglion cyst of wrist, right 05/09/2017  . Hyperlipidemia   . Hypertension   . Shingles   . Stroke [facility])    per pt. report had stroke 10-15 years ago    Past Surgical History:  Procedure Laterality Date  . ABDOMINAL HYSTERECTOMY  1994   Laparoscopic  . CARDIAC CATHETERIZATION N/A 10/23/2016   Procedure: Left Heart Cath and Coronary Angiography;  Surgeon: Burnell Blanks, MD;  Location: [place] CV LAB;  Service: Cardiovascular;  Laterality: N/A;  . CHOLECYSTECTOMY  1997   Laparoscopic  . COLONOSCOPY  2005-at age 14   in [place]-polyps removed per pt   . WISDOM TOOTH EXTRACTION      There were no vitals filed for this visit.  Subjective Assessment - 10/23/18 1102    Subjective  Did OK after last session with minimal soreness. Feels improving but still having pain intermittently with activity.    Currently in Pain?  Yes    Pain Score  4     Pain Location  Back    Pain Orientation  Right;Lower    Pain Descriptors / Indicators  Dull    Pain Type  Chronic pain    Pain Onset  More than a month ago    Pain Frequency  Intermittent    Aggravating  Factors   standing and walking, lifting    Pain Relieving Factors  rest, sitting    Effect of Pain on Daily Activities  limits tolerance for standing, walking and lifting for ADLs/IADLs                       [facility] Adult PT Treatment/Exercise - 10/23/18 0001      Exercises   Exercises  Lumbar;Knee/Hip      Lumbar Exercises: Stretches   Passive Hamstring Stretch  Right;Left;3 reps;30 seconds    Double Knee to Chest Stretch  --   x15 AAROM with 65 cm P-ball   Piriformis Stretch  Right;Left;3 reps;30 seconds      Lumbar Exercises: Aerobic   Nustep  L3 x 5 min UE/LE      Lumbar Exercises: Supine   Pelvic Tilt  15 reps    Glut Set  15 reps   glut set to partial bridge lift off   Clam  15 reps    Clam Limitations  Green Theraband    Bent Knee Raise  10 reps    Other Supine Lumbar Exercises  hip add. isometric with ball x 15 reps  Moist Heat Therapy   Number Minutes Moist Heat  10 Minutes    Moist Heat Location  Lumbar Spine      Manual Therapy   Manual Therapy  Soft tissue mobilization    Soft tissue mobilization  R lumbar paraspinals       Trigger [place] Dry Needling - 10/23/18 1147    Consent Given?  Yes    Muscles Treated Upper Body  Longissimus   brief needling R L4-5 region          PT Education - 10/23/18 1122    Education Details  POC, body mechanics    Person(s) Educated  Patient    Methods  Explanation    Comprehension  Verbalized understanding          PT Long Term Goals - 10/23/18 1126      PT LONG TERM GOAL #1   Title  Independent with HEP    Baseline  met for initial HEP, will continue to update prn    Time  6    Period  Weeks    Status  Achieved      PT LONG TERM GOAL #2   Title  Improve FOTO score to 37% or less impairment due to LBP    Baseline  53% limited at eval, not retested    Time  6    Period  Weeks    Status  On-going      PT LONG TERM GOAL #3   Title  Increase RLE strength grossly 1/2 MMT grade to  improve ability for stair navigation and chores at home    Baseline  grossly 4-/5 to 4/5    Time  6    Period  Weeks    Status  On-going      PT LONG TERM GOAL #4   Title  Tolerate standing/walking periods 30-40 min or greater for chores, IADLs, grocery shopping with LBP 2/10 or less    Baseline  4-5/10 pain with limited tolerance for these activities    Time  6    Period  Weeks    Status  On-going      PT LONG TERM GOAL #5   Title  Return demos proper body mechanics for logrolling and lifting technique    Baseline  met    Time  6    Period  Weeks            Plan - 10/23/18 1123    Clinical Impression Statement  Pt. continues to progress well with decreased LBP from baseline and improving tolerance for standing and ambulation. Still with LE and core weakness for which progress and need for continued PT to address remaining functional limitations will be ongoing.       Patient will benefit from skilled therapeutic intervention in order to improve the following deficits and impairments:     Visit Diagnosis: Chronic right-sided low back pain without sciatica  Muscle weakness (generalized)     Problem List Patient Active Problem List   Diagnosis Date Noted  . Cutaneous abscess of abdominal wall 06/25/2018  . Ganglion cyst of wrist, right 05/09/2017  . Coronary artery disease involving native coronary artery of native heart without angina pectoris   . Abnormal screening cardiac CT 10/16/2016  . Hyperlipidemia 08/03/2016  . Chest pain 08/03/2016  . Onychomycosis of toenail 04/18/2016  . GERD (gastroesophageal reflux disease) 02/27/2016  . Depression 02/27/2016  . Palpitation 03/19/2014  . Dyspnea on exertion 03/19/2014  . Fatigue  03/19/2014  . Essential hypertension 07/22/2013  . Obesity 07/22/2013  . Diabetes mellitus without complication ([place]) 01/77/9390  . Allergic rhinitis 07/22/2013    Beaulah Dinning, PT, DPT 10/23/18 11:48 AM  [facility] [address] [place], [place], [address] Phone: [phone]   Fax:  [phone]  Name: Haley [name] MRN: [id number] Date of Birth: [birth year]

## 2018-10-25 ENCOUNTER — Ambulatory Visit: Payer: BLUE CROSS/BLUE SHIELD | Admitting: Physical Therapy

## 2018-10-28 ENCOUNTER — Encounter: Payer: Self-pay | Admitting: Physical Therapy

## 2018-10-28 ENCOUNTER — Ambulatory Visit: Payer: BLUE CROSS/BLUE SHIELD | Admitting: Physical Therapy

## 2018-10-28 DIAGNOSIS — M545 Low back pain: Secondary | ICD-10-CM | POA: Diagnosis not present

## 2018-10-28 DIAGNOSIS — G8929 Other chronic pain: Secondary | ICD-10-CM

## 2018-10-28 DIAGNOSIS — M6281 Muscle weakness (generalized): Secondary | ICD-10-CM

## 2018-10-28 NOTE — Therapy (Signed)
[place], [place], [address] Phone: [phone]   Fax:  [phone]  Physical Therapy Treatment  Patient Details  Name: Haley [name] MRN: [id number] Date of Birth: [birth year] Referring Provider (PT): Mack Hook, MD   Encounter Date: 10/28/2018  PT End of Session - 10/28/18 1254    Visit Number  4    Number of Visits  12    Date for PT Re-Evaluation  11/22/18    Authorization Type  BCBS-30 visit limit    PT Start Time  8786   pt. arrived late   PT Stop Time  1230   32 min of time for timed therapy units with pt. late arrival and time for dry needling minutes not included   PT Time Calculation (min)  37 min    Activity Tolerance  Patient tolerated treatment well    Behavior During Therapy  [facility] for tasks assessed/performed       Past Medical History:  Diagnosis Date  . Allergy   . Depression   . Diabetes mellitus without complication ([place])   . Ganglion cyst of wrist, right 05/09/2017  . Hyperlipidemia   . Hypertension   . Shingles   . Stroke [facility])    per pt. report had stroke 10-15 years ago    Past Surgical History:  Procedure Laterality Date  . ABDOMINAL HYSTERECTOMY  1994   Laparoscopic  . CARDIAC CATHETERIZATION N/A 10/23/2016   Procedure: Left Heart Cath and Coronary Angiography;  Surgeon: Burnell Blanks, MD;  Location: [place] CV LAB;  Service: Cardiovascular;  Laterality: N/A;  . CHOLECYSTECTOMY  1997   Laparoscopic  . COLONOSCOPY  2005-at age 58   in [place]-polyps removed per pt   . WISDOM TOOTH EXTRACTION      There were no vitals filed for this visit.  Subjective Assessment - 10/28/18 1249    Subjective  8 min late to start tx. due to pt. running late. Pt. reports more stiff and sore today after prolonged standing and walking to visit friend in hospital, also notes more soreness with cold damp weather today. Primary pain remains in right lumbar  paraspinals.    Currently in Pain?  Yes    Pain Score  5     Pain Location  Back    Pain Orientation  Right;Lower    Pain Descriptors / Indicators  Sore    Pain Type  Chronic pain    Pain Onset  More than a month ago    Pain Frequency  Intermittent    Aggravating Factors   standing and walking, lifting activities    Pain Relieving Factors  rest, sitting    Effect of Pain on Daily Activities  limits tolerance for standing, walking and ability lifting for chores, IADLs.    Multiple Pain Sites  No         [facility] PT Assessment - 10/28/18 0001      Strength   Right Hip Flexion  4/5    Right Hip External Rotation   4/5    Right Hip Internal Rotation  4/5    Left Hip Flexion  4/5    Left Hip External Rotation  5/5    Left Hip Internal Rotation  5/5    Right Knee Flexion  5/5    Right Knee Extension  4+/5    Left Knee Flexion  5/5    Left Knee Extension  5/5    Right Ankle Dorsiflexion  4/5    Right Ankle Inversion  4/5    Right Ankle Eversion  4-/5    Left Ankle Dorsiflexion  4/5    Left Ankle Inversion  4/5    Left Ankle Eversion  4/5                   [facility] Adult PT Treatment/Exercise - 10/28/18 0001      Lumbar Exercises: Stretches   Passive Hamstring Stretch  Right;Left;3 reps;30 seconds    Double Knee to Chest Stretch  --   x15 AAROM with 65 cm P-ball   Lower Trunk Rotation  --   x15 AAROM with 65 cm P-ball emphasis to left   Piriformis Stretch  Right;Left;3 reps;30 seconds      Lumbar Exercises: Aerobic   Nustep  L4 x 5 min UE/LE      Lumbar Exercises: Supine   Glut Set  15 reps;5 seconds    Clam  15 reps    Clam Limitations  Green Theraband    Bent Knee Raise  --   12 reps   Other Supine Lumbar Exercises  hip add. iso. with ball combo with PPT x 15 reps      Manual Therapy   Manual Therapy  Soft tissue mobilization    Manual therapy comments  skilled palpation of trigger points    Soft tissue mobilization  R lumbar paraspinals       Trigger  Point Dry Needling - 10/28/18 1251    Consent Given?  Yes    Muscles Treated Upper Body  Longissimus   R side at L4-5 region in left sidelying   Longissimus Response  Twitch response elicited           PT Education - 10/28/18 1254    Education Details  HEP, POC    Person(s) Educated  Patient    Methods  Explanation    Comprehension  Verbalized understanding          PT Long Term Goals - 10/28/18 1259      PT LONG TERM GOAL #1   Title  Independent with HEP    Baseline  met for initial HEP, will continue to update prn    Time  6    Period  Weeks    Status  Achieved      PT LONG TERM GOAL #2   Title  Improve FOTO score to 37% or less impairment due to LBP    Baseline  53% limited at eval, not retested    Time  6    Period  Weeks    Status  On-going      PT LONG TERM GOAL #3   Title  Increase RLE strength grossly 1/2 MMT grade to improve ability for stair navigation and chores at home    Baseline  grossly 4-/5 to 4/5-see objective (rechecked today)    Time  6    Period  Weeks    Status  On-going      PT LONG TERM GOAL #4   Title  Tolerate standing/walking periods 30-40 min or greater for chores, IADLs, grocery shopping with LBP 2/10 or less    Baseline  4-5/10 pain with limited tolerance for these activities    Time  6    Period  Weeks    Status  On-going      PT LONG TERM GOAL #5   Title  Return demos proper body mechanics for logrolling and lifting technique  Baseline  met    Time  6    Period  Weeks    Status  Achieved            Plan - 10/28/18 1256    Clinical Impression Statement  Mild setback with soreness as noted in subjective but pt. does continue to improve with functional abilities for bed/mat mobility and transfers from baseline. Given chronic symptom history expect progress will be gradual to strengthening to take effect. Good tx. response to decrease c/o spasm right lumbar paraspinal region. Pt. would benefit from continued PT for  further progress to address remaining functional limitations.    Clinical Decision Making  Moderate    Rehab Potential  Good    Clinical Impairments Affecting Rehab Potential  expect good potential to address local lumbar symptoms though still unclear etiology leg weakness, parasthesias    PT Frequency  2x / week    PT Duration  6 weeks    PT Treatment/Interventions  ADLs/Self Care Home Management;Electrical Stimulation;Cryotherapy;Moist Heat;Neuromuscular re-education;Patient/family education;Dry needling;Manual techniques;Therapeutic activities;Therapeutic exercise    PT Next Visit Plan  try adding gentle core stabilization to lumbar ROM exercises for HEP, continue dry needling right lumbar paraspinals as needed, lumbar ROM/stretching/early stabilization as tolerated, progress/update HEP as needed    PT Home Exercise Plan  pelvic tilts, SKTC, LTR    Consulted and Agree with Plan of Care  Patient       Patient will benefit from skilled therapeutic intervention in order to improve the following deficits and impairments:  Pain, Improper body mechanics, Decreased activity tolerance, Obesity, Impaired flexibility, Difficulty walking, Decreased strength, Decreased range of motion, Decreased endurance, Increased muscle spasms, Impaired sensation  Visit Diagnosis: Chronic right-sided low back pain without sciatica  Muscle weakness (generalized)     Problem List Patient Active Problem List   Diagnosis Date Noted  . Cutaneous abscess of abdominal wall 06/25/2018  . Ganglion cyst of wrist, right 05/09/2017  . Coronary artery disease involving native coronary artery of native heart without angina pectoris   . Abnormal screening cardiac CT 10/16/2016  . Hyperlipidemia 08/03/2016  . Chest pain 08/03/2016  . Onychomycosis of toenail 04/18/2016  . GERD (gastroesophageal reflux disease) 02/27/2016  . Depression 02/27/2016  . Palpitation 03/19/2014  . Dyspnea on exertion 03/19/2014  . Fatigue  03/19/2014  . Essential hypertension 07/22/2013  . Obesity 07/22/2013  . Diabetes mellitus without complication ([place]) 19/16/6060  . Allergic rhinitis 07/22/2013    Beaulah Dinning, PT, DPT 10/28/18 1:02 PM  [facility] [address] [place], [place], [address] Phone: [phone]   Fax:  [phone]  Name: Haley [name] MRN: [id number] Date of Birth: 03/13/1956

## 2018-10-31 ENCOUNTER — Ambulatory Visit: Payer: BLUE CROSS/BLUE SHIELD | Admitting: Physical Therapy

## 2018-10-31 ENCOUNTER — Encounter: Payer: Self-pay | Admitting: Physical Therapy

## 2018-10-31 DIAGNOSIS — M545 Low back pain: Principal | ICD-10-CM

## 2018-10-31 DIAGNOSIS — G8929 Other chronic pain: Secondary | ICD-10-CM

## 2018-10-31 DIAGNOSIS — M6281 Muscle weakness (generalized): Secondary | ICD-10-CM

## 2018-10-31 NOTE — Therapy (Signed)
[place], [place], [address] Phone: [phone]   Fax:  [phone]  Physical Therapy Treatment  Patient Details  Name: Haley [name] MRN: [id number] Date of Birth: [birth year] Referring Provider (PT): Mack Hook, MD   Encounter Date: 10/31/2018  PT End of Session - 10/31/18 1105    Visit Number  5    Number of Visits  12    Date for PT Re-Evaluation  11/22/18    Authorization Type  BCBS-30 visit limit    PT Start Time  1105    PT Stop Time  1159    PT Time Calculation (min)  54 min    Activity Tolerance  Patient tolerated treatment well       Past Medical History:  Diagnosis Date  . Allergy   . Depression   . Diabetes mellitus without complication ([place])   . Ganglion cyst of wrist, right 05/09/2017  . Hyperlipidemia   . Hypertension   . Shingles   . Stroke [facility])    per pt. report had stroke 10-15 years ago    Past Surgical History:  Procedure Laterality Date  . ABDOMINAL HYSTERECTOMY  1994   Laparoscopic  . CARDIAC CATHETERIZATION N/A 10/23/2016   Procedure: Left Heart Cath and Coronary Angiography;  Surgeon: Burnell Blanks, MD;  Location: [facility];  Service: Cardiovascular;  Laterality: N/A;  . CHOLECYSTECTOMY  1997   Laparoscopic  . COLONOSCOPY  2005-at age 1   in [place]-polyps removed per pt   . WISDOM TOOTH EXTRACTION      There were no vitals filed for this visit.  Subjective Assessment - 10/31/18 1106    Subjective  Pt reports she is feeling better, not in pain however still has some soreness at times.  She almost didn't come.     Patient Stated Goals  Get rid of back pain    Currently in Pain?  No/denies    Aggravating Factors   doing exercise and activity                       [facility] Adult PT Treatment/Exercise - 10/31/18 0001      Self-Care   Self-Care  Other Self-Care Comments    Other Self-Care Comments   explained the  importance of continuing wiht PT to learn stabilization/strengtheing exercise to prevent pain from returning and not just stopping therapy because she feels better for the day.       Exercises   Exercises  Lumbar      Lumbar Exercises: Stretches   Single Knee to Chest Stretch  Left;Right;20 seconds    Lower Trunk Rotation  20 seconds;3 reps   with arms out to the sides.    Other Lumbar Stretch Exercise  good morning stretch for abdominal muscles      Lumbar Exercises: Aerobic   Stationary Bike  L2x5'    Nustep  --      Lumbar Exercises: Supine   Pelvic Tilt  10 reps;5 seconds    Clam  10 reps    Bridge  5 reps   4 sets   Bridge with March  --    Isometric Hip Flexion  10 reps;5 seconds   bilat   Other Supine Lumbar Exercises  10 reps TA contraction with leg extension      Modalities   Modalities  Moist Heat      Moist Heat Therapy   Number Minutes  Moist Heat  15 Minutes    Moist Heat Location  Lumbar Spine             PT Education - 10/31/18 1131    Education Details  initial core stabilization - TA activation and work    [employer]) Educated  Patient    Methods  Explanation;Handout    Comprehension  Verbal cues required;Returned demonstration;Verbalized understanding          PT Long Term Goals - 10/28/18 1259      PT LONG TERM GOAL #1   Title  Independent with HEP    Baseline  met for initial HEP, will continue to update prn    Time  6    Period  Weeks    Status  Achieved      PT LONG TERM GOAL #2   Title  Improve FOTO score to 37% or less impairment due to LBP    Baseline  53% limited at eval, not retested    Time  6    Period  Weeks    Status  On-going      PT LONG TERM GOAL #3   Title  Increase RLE strength grossly 1/2 MMT grade to improve ability for stair navigation and chores at home    Baseline  grossly 4-/5 to 4/5-see objective (rechecked today)    Time  6    Period  Weeks    Status  On-going      PT LONG TERM GOAL #4   Title   Tolerate standing/walking periods 30-40 min or greater for chores, IADLs, grocery shopping with LBP 2/10 or less    Baseline  4-5/10 pain with limited tolerance for these activities    Time  6    Period  Weeks    Status  On-going      PT LONG TERM GOAL #5   Title  Return demos proper body mechanics for logrolling and lifting technique    Baseline  met    Time  6    Period  Weeks    Status  Achieved            Plan - 10/31/18 1242    Clinical Impression Statement  Haley [name] did well with her exercise progression today.  She was able to feel her deep abdominal muscles engaging.  She did have some tightness/soreness after tx that settled down with heat.  Overall reports her pain is improving, has not started performing all her IADLs yet    PT Treatment/Interventions  ADLs/Self Care Home Management;Electrical Stimulation;Cryotherapy;Moist Heat;Neuromuscular re-education;Patient/family education;Dry needling;Manual techniques;Therapeutic activities;Therapeutic exercise    PT Next Visit Plan  assess response to new HEP, continue with core stabilization.     Consulted and Agree with Plan of Care  Patient       Patient will benefit from skilled therapeutic intervention in order to improve the following deficits and impairments:  Pain, Improper body mechanics, Decreased activity tolerance, Obesity, Impaired flexibility, Difficulty walking, Decreased strength, Decreased range of motion, Decreased endurance, Increased muscle spasms, Impaired sensation  Visit Diagnosis: Chronic right-sided low back pain without sciatica  Muscle weakness (generalized)     Problem List Patient Active Problem List   Diagnosis Date Noted  . Cutaneous abscess of abdominal wall 06/25/2018  . Ganglion cyst of wrist, right 05/09/2017  . Coronary artery disease involving native coronary artery of native heart without angina pectoris   . Abnormal screening cardiac CT 10/16/2016  . Hyperlipidemia 08/03/2016  .  Chest  pain 08/03/2016  . Onychomycosis of toenail 04/18/2016  . GERD (gastroesophageal reflux disease) 02/27/2016  . Depression 02/27/2016  . Palpitation 03/19/2014  . Dyspnea on exertion 03/19/2014  . Fatigue 03/19/2014  . Essential hypertension 07/22/2013  . Obesity 07/22/2013  . Diabetes mellitus without complication ([place]) 25/06/7198  . Allergic rhinitis 07/22/2013    Jeral Pinch PT  10/31/2018, 12:44 PM  [facility] [address] [place], [place], [address] Phone: [phone]   Fax:  [phone]  Name: Haley [name] MRN: [id number] Date of Birth: October 23, 1956

## 2018-11-04 ENCOUNTER — Encounter: Payer: Self-pay | Admitting: Physical Therapy

## 2018-11-04 ENCOUNTER — Ambulatory Visit: Payer: BLUE CROSS/BLUE SHIELD | Admitting: Physical Therapy

## 2018-11-04 ENCOUNTER — Telehealth (HOSPITAL_COMMUNITY): Payer: Self-pay | Admitting: *Deleted

## 2018-11-04 DIAGNOSIS — M545 Low back pain, unspecified: Secondary | ICD-10-CM

## 2018-11-04 DIAGNOSIS — G8929 Other chronic pain: Secondary | ICD-10-CM

## 2018-11-04 DIAGNOSIS — M6281 Muscle weakness (generalized): Secondary | ICD-10-CM

## 2018-11-04 NOTE — Telephone Encounter (Signed)
Patient given detailed instructions per Myocardial Perfusion Study Information Sheet for the test on 11/11/18 at 1315. Patient notified to arrive 15 minutes early and that it is imperative to arrive on time for appointment to keep from having the test rescheduled.  If you need to cancel or reschedule your appointment, please call the office within 24 hours of your appointment. . Patient verbalized understanding.Renita Brocks, Adelene IdlerCynthia W

## 2018-11-04 NOTE — Therapy (Signed)
[place], [place], [address] Phone: [phone]   Fax:  [phone]  Physical Therapy Treatment/Discharge  Patient Details  Name: Haley [name] MRN: [id number] Date of Birth: Jan 01, 1956 Referring Provider (PT): Mack Hook, MD   Encounter Date: 11/04/2018  PT End of Session - 11/04/18 1155    Visit Number  6    Number of Visits  12    Date for PT Re-Evaluation  11/22/18    Authorization Type  BCBS-30 visit limit    PT Start Time  1151    PT Stop Time  1233    PT Time Calculation (min)  42 min    Activity Tolerance  Patient tolerated treatment well    Behavior During Therapy  [facility] for tasks assessed/performed       Past Medical History:  Diagnosis Date  . Allergy   . Depression   . Diabetes mellitus without complication ([place])   . Ganglion cyst of wrist, right 05/09/2017  . Hyperlipidemia   . Hypertension   . Shingles   . Stroke [facility])    per pt. report had stroke 10-15 years ago    Past Surgical History:  Procedure Laterality Date  . ABDOMINAL HYSTERECTOMY  1994   Laparoscopic  . CARDIAC CATHETERIZATION N/A 10/23/2016   Procedure: Left Heart Cath and Coronary Angiography;  Surgeon: Burnell Blanks, MD;  Location: [facility];  Service: Cardiovascular;  Laterality: N/A;  . CHOLECYSTECTOMY  1997   Laparoscopic  . COLONOSCOPY  2005-at age 61   in [place]-polyps removed per pt   . WISDOM TOOTH EXTRACTION      There were no vitals filed for this visit.  Subjective Assessment - 11/04/18 1153    Subjective  Pt. rates improvement of LBP at 90% from baseline status at eval. She reports ready for d/c today and plan will be to continue with home exercises.    How long can you sit comfortably?  soreness with sitting at times but no significant difficulties    How long can you stand comfortably?  no limitation    How long can you walk comfortably?  no limitations    Patient  Stated Goals  Get rid of back pain    Currently in Pain?  No/denies    Pain Score  0-No pain         [facility] PT Assessment - 11/04/18 0001      Observation/Other Assessments   Focus on Therapeutic Outcomes (FOTO)   17% limited      AROM   Right Hip Flexion  95    Right Hip External Rotation   32    Right Hip Internal Rotation   20    Right Hip ABduction  50    Lumbar Flexion  95    Lumbar Extension  25    Lumbar - Right Side Bend  20    Lumbar - Left Side Bend  20    Lumbar - Right Rotation  75%    Lumbar - Left Rotation  60%      Strength   Right Hip Flexion  4/5    Right Hip External Rotation   4+/5    Right Hip Internal Rotation  5/5    Left Hip Flexion  4+/5    Left Hip External Rotation  5/5    Left Hip Internal Rotation  5/5    Right Knee Flexion  5/5    Right Knee  Extension  4+/5    Left Knee Flexion  5/5    Left Knee Extension  5/5    Right Ankle Dorsiflexion  5/5    Right Ankle Inversion  4+/5    Right Ankle Eversion  4+/5    Left Ankle Dorsiflexion  5/5    Left Ankle Inversion  5/5    Left Ankle Eversion  5/5                   [facility] Adult PT Treatment/Exercise - 11/04/18 0001      Lumbar Exercises: Stretches   Passive Hamstring Stretch  Right;Left;3 reps;30 seconds    Single Knee to Chest Stretch  Right;Left;20 seconds    Lower Trunk Rotation  3 reps;20 seconds    Piriformis Stretch  Right;Left;3 reps;30 seconds      Lumbar Exercises: Aerobic   Nustep  L4x5 min UE/LE      Lumbar Exercises: Seated   Other Seated Lumbar Exercises  demo seated pelvic rocks and marches on P-ball      Lumbar Exercises: Supine   Pelvic Tilt  15 reps    Clam  15 reps    Clam Limitations  Green Theraband    Bent Knee Raise  15 reps    Bridge  15 reps    Isometric Hip Flexion  10 reps;5 seconds    Other Supine Lumbar Exercises  10 reps TA contraction             PT Education - 11/04/18 1234    Education Details  HEP, POC, body mechanics     Person(s) Educated  Patient    Methods  Explanation;Demonstration;Verbal cues    Comprehension  Verbalized understanding          PT Long Term Goals - 11/04/18 1224      PT LONG TERM GOAL #1   Title  Independent with HEP    Baseline  met    Time  6    Period  Weeks    Status  Achieved      PT LONG TERM GOAL #2   Title  Improve FOTO score to 37% or less impairment due to LBP    Baseline  met, 17% limited    Time  6    Period  Weeks    Status  Achieved      PT LONG TERM GOAL #3   Title  Increase RLE strength grossly 1/2 MMT grade to improve ability for stair navigation and chores at home    Baseline  see objective    Time  6    Period  Weeks    Status  Partially Met      PT LONG TERM GOAL #4   Title  Tolerate standing/walking periods 30-40 min or greater for chores, IADLs, grocery shopping with LBP 2/10 or less    Baseline  met    Time  6    Period  Weeks    Status  Achieved      PT LONG TERM GOAL #5   Title  Return demos proper body mechanics for logrolling and lifting technique    Baseline  met    Time  6    Period  Weeks    Status  Achieved            Plan - 11/04/18 1234    Clinical Impression Statement  Pt. has made excellent progress with therapy for decreased LBP and improved functional status. Still with  some mild right>left LE weakness but otherwise therapy goals met. Expect pt. can continue progress independently via HEP and pt. will follow up with MD if having any changes in status.    Rehab Potential  Good    PT Frequency  2x / week    PT Duration  6 weeks    PT Treatment/Interventions  ADLs/Self Care Home Management;Electrical Stimulation;Cryotherapy;Moist Heat;Neuromuscular re-education;Patient/family education;Dry needling;Manual techniques;Therapeutic activities;Therapeutic exercise    PT Next Visit Plan  NA    PT Home Exercise Plan  pelvic tilts, SKTC, LTR, bent knee raises, TA contraction isometric, alt. hip flexion isometrics    Consulted  and Agree with Plan of Care  Patient       Patient will benefit from skilled therapeutic intervention in order to improve the following deficits and impairments:  Pain, Improper body mechanics, Decreased activity tolerance, Obesity, Impaired flexibility, Difficulty walking, Decreased strength, Decreased range of motion, Decreased endurance, Increased muscle spasms, Impaired sensation  Visit Diagnosis: Chronic right-sided low back pain without sciatica  Muscle weakness (generalized)     Problem List Patient Active Problem List   Diagnosis Date Noted  . Cutaneous abscess of abdominal wall 06/25/2018  . Ganglion cyst of wrist, right 05/09/2017  . Coronary artery disease involving native coronary artery of native heart without angina pectoris   . Abnormal screening cardiac CT 10/16/2016  . Hyperlipidemia 08/03/2016  . Chest pain 08/03/2016  . Onychomycosis of toenail 04/18/2016  . GERD (gastroesophageal reflux disease) 02/27/2016  . Depression 02/27/2016  . Palpitation 03/19/2014  . Dyspnea on exertion 03/19/2014  . Fatigue 03/19/2014  . Essential hypertension 07/22/2013  . Obesity 07/22/2013  . Diabetes mellitus without complication ([place]) 43/73/5789  . Allergic rhinitis 07/22/2013        PHYSICAL THERAPY DISCHARGE SUMMARY  Visits from Start of Care: 6  Current functional level related to goals / functional outcomes: Patient has made significant improvements with decreased LBP and improved functional status for walking tolerance and ability bed mobility, tranfers.   Remaining deficits: LE weakness   Education / Equipment: HEP, POC Plan: Patient agrees to discharge.  Patient goals were partially met. Patient is being discharged due to being pleased with the current functional level.  ?????          Beaulah Dinning, PT, DPT 11/04/18 12:40 PM    [facility] [address] [place], [place], [address] Phone:  [phone]   Fax:  [phone]  Name: Haley [name] MRN: [id number] Date of Birth: 20-Mar-1956

## 2018-11-07 ENCOUNTER — Ambulatory Visit: Payer: BLUE CROSS/BLUE SHIELD | Admitting: Physical Therapy

## 2018-11-11 ENCOUNTER — Ambulatory Visit (HOSPITAL_COMMUNITY): Payer: BLUE CROSS/BLUE SHIELD | Attending: Internal Medicine

## 2018-11-11 DIAGNOSIS — R079 Chest pain, unspecified: Secondary | ICD-10-CM | POA: Diagnosis present

## 2018-11-11 MED ORDER — TECHNETIUM TC 99M TETROFOSMIN IV KIT
31.7000 | PACK | Freq: Once | INTRAVENOUS | Status: AC | PRN
  Administered 2018-11-11: 31.7 via INTRAVENOUS
  Filled 2018-11-11: qty 32

## 2018-11-12 ENCOUNTER — Ambulatory Visit (HOSPITAL_COMMUNITY): Payer: BLUE CROSS/BLUE SHIELD | Attending: Cardiovascular Disease

## 2018-11-12 ENCOUNTER — Ambulatory Visit (HOSPITAL_COMMUNITY): Payer: BLUE CROSS/BLUE SHIELD

## 2018-11-12 LAB — MYOCARDIAL PERFUSION IMAGING
Estimated workload: 7 METS
Exercise duration (min): 5 min
Exercise duration (sec): 0 s
LV dias vol: 54 mL (ref 46–106)
LV sys vol: 17 mL
MPHR: 156 {beats}/min
Peak HR: 141 {beats}/min
Percent HR: 90 %
Rest HR: 88 {beats}/min
SDS: 0
SRS: 0
SSS: 0
TID: 0.75

## 2018-11-12 MED ORDER — TECHNETIUM TC 99M TETROFOSMIN IV KIT
32.6000 | PACK | Freq: Once | INTRAVENOUS | Status: AC | PRN
  Administered 2018-11-12: 32.6 via INTRAVENOUS
  Filled 2018-11-12: qty 33

## 2018-11-21 ENCOUNTER — Telehealth: Payer: Self-pay | Admitting: Cardiology

## 2018-11-21 NOTE — Telephone Encounter (Signed)
  Patient calling stating that she is returning a call

## 2018-11-21 NOTE — Telephone Encounter (Signed)
Notes recorded by Lars Masson, MD on 11/21/2018 at 12:43 PM EST Normal event monitor. Continue the same dose of metoprolol.   Spoke with the pt and informed her that per Dr Delton See, she had a normal event monitor and she recommends that she continue taking her current dose of metoprolol.  Pt verbalized understanding and agrees with this plan.

## 2019-01-02 ENCOUNTER — Telehealth: Payer: Self-pay | Admitting: Internal Medicine

## 2019-01-02 NOTE — Telephone Encounter (Signed)
Patient called stating is having  right leg pain x about two weeks. She states everything started with pain in the knee.  Patient is not taking anything for it  and will like to be seen for this matter.   Please advise.

## 2019-01-02 NOTE — Telephone Encounter (Signed)
Pt . Scheduled to come in tomorrow 01/02/2019 at 12:30 pm

## 2019-01-03 ENCOUNTER — Ambulatory Visit (INDEPENDENT_AMBULATORY_CARE_PROVIDER_SITE_OTHER): Payer: Medicare Other | Admitting: Internal Medicine

## 2019-01-03 ENCOUNTER — Encounter: Payer: Self-pay | Admitting: Internal Medicine

## 2019-01-03 VITALS — BP 136/80 | HR 76 | Resp 12 | Ht 61.25 in | Wt 226.0 lb

## 2019-01-03 DIAGNOSIS — F329 Major depressive disorder, single episode, unspecified: Secondary | ICD-10-CM

## 2019-01-03 DIAGNOSIS — M5416 Radiculopathy, lumbar region: Secondary | ICD-10-CM | POA: Diagnosis not present

## 2019-01-03 DIAGNOSIS — M25561 Pain in right knee: Secondary | ICD-10-CM | POA: Diagnosis not present

## 2019-01-03 DIAGNOSIS — F32A Depression, unspecified: Secondary | ICD-10-CM

## 2019-01-03 MED ORDER — MELOXICAM 7.5 MG PO TABS: 7.5000 mg | ORAL_TABLET | Freq: Every day | ORAL | 1 refills | Status: DC

## 2019-01-03 NOTE — Progress Notes (Signed)
Subjective:    Patient ID: Haley [name], female   DOB: 1956/08/26, 63 y.o.   MRN: [id number]   HPI   Started with right knee pain about 1 month ago.  Has chronic recurrent issues with knee pain.   Started walking to see if will help.   Then leg started swelling.  Points to greater trochanter area as a new source of pain about 1 week into walking.   Then developed swelling and pain in medial superior area above knee 2 weeks. If pressed on the area of swelling, pain shoots down lower leg About 1 week ago, numbness down back of leg and feels like her leg will give out, but has not actually done so.   Initially numbness was with sitting only, but has become more consistent in past week, no matter her position.    Concerned this is related to her diabetes, but sugars have been running generally below 100 in the morning.  High 90s in the afternoons and evenings as well.    At end of exam, patient shared recurrence of depressive symptoms.  Describes difficult situations in her past growing up.  She has been having deeper conversations with her sister, which have been causing her to relive some of these traumatic experiences.  She really does not want to talk about them again, but feels she must be a listener for her sister. Previously, she worked with our LCSW, Samul Dada, who is no longer with [employer].    Current Meds  Medication Sig  . amLODipine (NORVASC) 5 MG tablet TAKE 1 TABLET BY MOUTH ONCE DAILY  . aspirin 81 MG tablet Take 81 mg by mouth daily.  . cetirizine (ZYRTEC) 10 MG tablet TAKE 1 TABLET BY MOUTH ONCE DAILY  . Cholecalciferol (VITAMIN D) 2000 units CAPS Take by mouth. 1 daily  . GARLIC PO Take 725 mg by mouth. 2 daily  . glimepiride (AMARYL) 2 MG tablet 1 1/2 tabs by mouth once daily with breakfast  . ibuprofen (ADVIL,MOTRIN) 200 MG tablet Take 600 mg by mouth every 6 (six) hours as needed for headache.  . lisinopril (PRINIVIL,ZESTRIL) 40 MG tablet TAKE 1 TABLET BY  MOUTH ONCE DAILY  . metoprolol tartrate (LOPRESSOR) 25 MG tablet Take 1 tablet (25 mg total) by mouth 2 (two) times daily.  Marland Kitchen MILK THISTLE PO Take by mouth daily.  . Misc Natural Products (TART CHERRY ADVANCED PO) Take by mouth daily.  . mometasone (NASONEX) 50 MCG/ACT nasal spray USE 2 SPRAY(S) IN EACH NOSTRIL ONCE DAILY  . Multiple Vitamins-Minerals (MULTIVITAMIN WITH MINERALS) tablet Take 1 tablet by mouth daily.  . nitroGLYCERIN (NITROSTAT) 0.4 MG SL tablet 1 tab under tongue for chest pain.  May repeat every 5 minutes x 2 if chest pain not relieved  . omega-3 acid ethyl esters (LOVAZA) 1 g capsule Take 1 g by mouth 2 (two) times daily.  . rosuvastatin (CRESTOR) 10 MG tablet Take 1 tablet (10 mg total) by mouth daily.  . vitamin C (ASCORBIC ACID) 500 MG tablet Take 500 mg by mouth daily.  Marland Kitchen zinc gluconate 50 MG tablet Take 50 mg by mouth daily.   Allergies  Allergen Reactions  . Praluent [Alirocumab]     Had significant fatigue and memory loss--was lost driving in town.  Joint pain and nausea as well.  Marland Kitchen Zetia [Ezetimibe] Other (See Comments)    Joint pain and ear ringing  . Atorvastatin Other (See Comments)    Pt states  causes bilateral lower extremity muscle cramps  . Pravastatin Nausea And Vomiting and Other (See Comments)    Causes dizziness     Review of Systems    Objective:   BP 136/80 (BP Location: Left Arm, Patient Position: Sitting, Cuff Size: Large)   Pulse 76   Resp 12   Ht 5' 1.25" (1.556 m)   Wt 226 lb (102.5 kg)   BMI 42.35 kg/m   Physical Exam NAD Low back/Right hip/Right knee:  Mild tenderness, right lower paraspinous musclature.  Mild tenderness over greater trochanter, but does not elicit significant discomfort down leg with palpation.   Full ROM at hip. Full ROM at knee   Knee without palpable effusion.  Tender over right superior medial knee soft tissue.  Mild tenderness with compression against anterior patella.  Mild tenderness along medial and  lateral joint lines.  No laxity or increased pain with stress of cruciates and collateral ligaments.  NT without mass of posterior knee.   Full ROM DP and PT pulses normal and equal  Assessment & Plan   1.  Right low back pain with radiculopathy:  Underwent PT for this.  Check LS spine films.  Exam not supportive of more of a trochanteric bursitis Meloxicam 7.5 mg daily with meal as needed  2.  Right knee pain:  Suspect arthritis.  Meloxicam as above.  3.  ?PTSD and depression:  Checking with Ree Kida Register to see if has ability to see someone with Medicare for these diagnoses.

## 2019-01-06 ENCOUNTER — Ambulatory Visit
Admission: RE | Admit: 2019-01-06 | Discharge: 2019-01-06 | Disposition: A | Discharge status: Home / Self Care | Payer: BLUE CROSS/BLUE SHIELD | Source: Ambulatory Visit | Attending: Internal Medicine | Admitting: Internal Medicine

## 2019-01-06 DIAGNOSIS — M5416 Radiculopathy, lumbar region: Secondary | ICD-10-CM

## 2019-01-06 DIAGNOSIS — M25561 Pain in right knee: Secondary | ICD-10-CM

## 2019-01-10 ENCOUNTER — Telehealth: Payer: Self-pay | Admitting: Internal Medicine

## 2019-01-10 NOTE — Telephone Encounter (Signed)
To Dr. Mulberry to review results 

## 2019-01-10 NOTE — Telephone Encounter (Signed)
Patient called requesting information regarding her X-rays results that were done last Monday. Patient stated was instructed to call at the end of this week.  Please advise.

## 2019-01-19 ENCOUNTER — Encounter: Payer: Self-pay | Admitting: Internal Medicine

## 2019-01-19 DIAGNOSIS — M25561 Pain in right knee: Secondary | ICD-10-CM | POA: Insufficient documentation

## 2019-01-19 DIAGNOSIS — M5416 Radiculopathy, lumbar region: Secondary | ICD-10-CM | POA: Insufficient documentation

## 2019-01-19 DIAGNOSIS — G8929 Other chronic pain: Secondary | ICD-10-CM | POA: Insufficient documentation

## 2019-01-22 NOTE — Telephone Encounter (Signed)
Spoke with patient at length on 01/19/2019 regarding knee and lumbar xray results. 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 Her main pain is going up the stairs.  I did fill out forms for her to get a 1st floor apartment. She does feel the PT helped with back. She never filled the Meloxicam due to her concerns with heart. Her cardiac work up showed no increase in CAD She would prefer to use natural alternatives.   Has started using ginger, which she feels helps. Continues on tart cherry as well. Discussed Tylenol 650 to 1000 twice daily as well. Also quad strengthening exercises as long as knee joint pain does not worsen.

## 2019-02-27 ENCOUNTER — Other Ambulatory Visit: Payer: Self-pay | Admitting: Internal Medicine

## 2019-02-27 MED ORDER — GLIMEPIRIDE 2 MG PO TABS: ORAL_TABLET | ORAL | 3 refills | Status: DC

## 2019-02-27 MED ORDER — LISINOPRIL 40 MG PO TABS: 40.0000 mg | ORAL_TABLET | Freq: Every day | ORAL | 3 refills | Status: DC

## 2019-03-11 ENCOUNTER — Other Ambulatory Visit: Payer: Self-pay

## 2019-03-11 MED ORDER — NITROGLYCERIN 0.4 MG SL SUBL: SUBLINGUAL_TABLET | SUBLINGUAL | 1 refills | Status: DC

## 2019-04-01 ENCOUNTER — Telehealth: Payer: Self-pay | Admitting: Internal Medicine

## 2019-04-01 NOTE — Telephone Encounter (Signed)
Pt. Called states she has not heard anything regarding her diabetic supplies and was advise by Cherice to call today to follow up

## 2019-04-03 NOTE — Telephone Encounter (Signed)
Spoke with patient. She will contact [employer] and have them fax over another form for her diabetic supplies to have sent in again. While on the phone patient disclosed she has a boil on her stomach again same as last year for 2 days now. Painful, red and had a head to it. It busted and drained the day it busted but since then no drainage. Per Dr. Delrae Alfred will have patient do virtual visit tomorrow and will call in antibiotics to the pharmacy. Patient Rx for clindamycin will be sent to [facility] on [address]. Patient verbalized understanding of appointment tomorrow and picking up Rx today.

## 2019-04-04 ENCOUNTER — Other Ambulatory Visit: Payer: Self-pay

## 2019-04-04 ENCOUNTER — Telehealth (INDEPENDENT_AMBULATORY_CARE_PROVIDER_SITE_OTHER): Payer: Medicare Other | Admitting: Internal Medicine

## 2019-04-04 DIAGNOSIS — L02211 Cutaneous abscess of abdominal wall: Secondary | ICD-10-CM | POA: Diagnosis not present

## 2019-04-04 MED ORDER — AMOXICILLIN-POT CLAVULANATE 875-125 MG PO TABS: ORAL_TABLET | ORAL | 0 refills | Status: DC

## 2019-04-04 MED ORDER — FLUCONAZOLE 150 MG PO TABS: ORAL_TABLET | ORAL | 0 refills | Status: DC

## 2019-04-04 NOTE — Progress Notes (Signed)
    Subjective:    Patient ID: Haley [name], female   DOB: 1956/11/05, 62 y.o.   MRN: [id number]   HPI   Noted a pustule on her right upper abdomen since 5 days ago.  Did have a fever 3 nights ago for 2 nights.   Has been applying Bacitracin ointment, but continued to enlarge. She has been applying warm packs since last evening after speaking with Cherice. Sugars have been well controlled  Current Meds  Medication Sig  . amLODipine (NORVASC) 5 MG tablet TAKE 1 TABLET BY MOUTH ONCE DAILY  . aspirin 81 MG tablet Take 81 mg by mouth daily.  . cetirizine (ZYRTEC) 10 MG tablet TAKE 1 TABLET BY MOUTH ONCE DAILY  . Cholecalciferol (VITAMIN D) 2000 units CAPS Take by mouth. 1 daily  . GARLIC PO Take 149 mg by mouth. 2 daily  . glimepiride (AMARYL) 2 MG tablet 1 1/2 tabs by mouth once daily with breakfast  . lisinopril (ZESTRIL) 40 MG tablet Take 1 tablet (40 mg total) by mouth daily.  . metoprolol tartrate (LOPRESSOR) 25 MG tablet Take 1 tablet (25 mg total) by mouth 2 (two) times daily.  . Misc Natural Products (TART CHERRY ADVANCED PO) Take by mouth daily.  . mometasone (NASONEX) 50 MCG/ACT nasal spray USE 2 SPRAY(S) IN EACH NOSTRIL ONCE DAILY  . Multiple Vitamins-Minerals (MULTIVITAMIN WITH MINERALS) tablet Take 1 tablet by mouth daily.  Marland Kitchen omega-3 acid ethyl esters (LOVAZA) 1 g capsule Take 1 g by mouth 2 (two) times daily.  . psyllium (REGULOID) 0.52 g capsule Take 0.52 g by mouth daily. 1 tab by mouth once daily  . rosuvastatin (CRESTOR) 10 MG tablet Take 1 tablet (10 mg total) by mouth daily.  . vitamin C (ASCORBIC ACID) 500 MG tablet Take 500 mg by mouth daily.  Marland Kitchen zinc gluconate 50 MG tablet Take 50 mg by mouth daily.   Allergies  Allergen Reactions  . Praluent [Alirocumab]     Had significant fatigue and memory loss--was lost driving in town.  Joint pain and nausea as well.  Marland Kitchen Zetia [Ezetimibe] Other (See Comments)    Joint pain and ear ringing  . Atorvastatin Other (See  Comments)    Pt states causes bilateral lower extremity muscle cramps  . Pravastatin Nausea And Vomiting and Other (See Comments)    Causes dizziness     Review of Systems    Objective:   There were no vitals taken for this visit.  Physical Exam   Abdominal skin/RUQ area:  circular area of erythema with central raised area.  Patient states central area hard, but has has scant bloody discharge from there.  Would estimate entire area about 3 cm in diameter via virtual visualization. Assessment & Plan   Abdominal skin abscess or induration/cellulitis Augmentin 875/125 mg twice daily for 10 days. Heating pad at medium heat for 20 minutes about 30 minutes after each dose of antibiotics Fluconazole once daily for 2 doses should she develop a yeast infection.

## 2019-04-04 NOTE — Patient Instructions (Signed)
Call progress on Sunday to clinic number

## 2019-04-07 ENCOUNTER — Telehealth: Payer: Self-pay | Admitting: Internal Medicine

## 2019-04-07 NOTE — Telephone Encounter (Signed)
Started with phone call: She stated yesterday her wound was now draining. She did have a higher fever on Friday night along with chills, but that has resolved. The redness is a bit decreased.  Went to virtual video to see the area of infection   Has about 1 cm opening with minimal drainage.   The surrounding erythema is decreased.  She is feeling better.  Still tender.    Will give it another 24 hours to see if significant improvement before having her come in. She is reticent to come out of her apartment with the pandemic

## 2019-04-11 ENCOUNTER — Telehealth: Payer: Self-pay | Admitting: Internal Medicine

## 2019-04-11 ENCOUNTER — Other Ambulatory Visit: Payer: Self-pay

## 2019-04-11 ENCOUNTER — Ambulatory Visit (INDEPENDENT_AMBULATORY_CARE_PROVIDER_SITE_OTHER): Payer: Medicare Other | Admitting: Internal Medicine

## 2019-04-11 ENCOUNTER — Encounter: Payer: Self-pay | Admitting: Internal Medicine

## 2019-04-11 VITALS — BP 138/80 | HR 74 | Resp 12 | Ht 61.25 in | Wt 226.0 lb

## 2019-04-11 DIAGNOSIS — L02211 Cutaneous abscess of abdominal wall: Secondary | ICD-10-CM

## 2019-04-11 MED ORDER — AMOXICILLIN-POT CLAVULANATE 875-125 MG PO TABS: ORAL_TABLET | ORAL | 0 refills | Status: DC

## 2019-04-11 NOTE — Telephone Encounter (Signed)
The abscess is draining a lot, and less erythematous, but still with areas of significant induration. Visualized on Up[employer].   Will have her come in today around 3 p.m to see if needs I and D. Extend Augmentin 4 more days depending on how extensive this is when she comes in.

## 2019-04-11 NOTE — Patient Instructions (Signed)
Keep covered with dry gauze and change daily or as needed with drainage

## 2019-04-11 NOTE — Progress Notes (Signed)
Subjective:    Patient ID: Haley [name], female   DOB: [birth year], 63 y.o.   MRN: [id number]   HPI   Right abdominal wall abscess:  See previous virtual visits and phone calls.  Here to have this evaluated for I & D    Current Meds  Medication Sig  . amLODipine (NORVASC) 5 MG tablet TAKE 1 TABLET BY MOUTH ONCE DAILY  . amoxicillin-clavulanate (AUGMENTIN) 875-125 MG tablet 1 tab by mouth twice daily for 5 more days  . aspirin 81 MG tablet Take 81 mg by mouth daily.  . cetirizine (ZYRTEC) 10 MG tablet TAKE 1 TABLET BY MOUTH ONCE DAILY  . Cholecalciferol (VITAMIN D) 2000 units CAPS Take by mouth. 1 daily  . GARLIC PO Take 761 mg by mouth. 2 daily  . glimepiride (AMARYL) 2 MG tablet 1 1/2 tabs by mouth once daily with breakfast  . lisinopril (ZESTRIL) 40 MG tablet Take 1 tablet (40 mg total) by mouth daily.  . metoprolol tartrate (LOPRESSOR) 25 MG tablet Take 1 tablet (25 mg total) by mouth 2 (two) times daily.  . Misc Natural Products (TART CHERRY ADVANCED PO) Take by mouth daily.  . mometasone (NASONEX) 50 MCG/ACT nasal spray USE 2 SPRAY(S) IN EACH NOSTRIL ONCE DAILY  . Multiple Vitamins-Minerals (MULTIVITAMIN WITH MINERALS) tablet Take 1 tablet by mouth daily.  . nitroGLYCERIN (NITROSTAT) 0.4 MG SL tablet 1 tab under tongue for chest pain.  May repeat every 5 minutes x 2 if chest pain not relieved  . omega-3 acid ethyl esters (LOVAZA) 1 g capsule Take 1 g by mouth 2 (two) times daily.  . psyllium (REGULOID) 0.52 g capsule Take 0.52 g by mouth daily. 1 tab by mouth once daily  . rosuvastatin (CRESTOR) 10 MG tablet Take 1 tablet (10 mg total) by mouth daily.  . vitamin C (ASCORBIC ACID) 500 MG tablet Take 500 mg by mouth daily.  Marland Kitchen zinc gluconate 50 MG tablet Take 50 mg by mouth daily.  . [DISCONTINUED] amoxicillin-clavulanate (AUGMENTIN) 875-125 MG tablet 1 tab by mouth twice daily for 10 days.   Allergies  Allergen Reactions  . Praluent [Alirocumab]     Had significant  fatigue and memory loss--was lost driving in town.  Joint pain and nausea as well.  Marland Kitchen Zetia [Ezetimibe] Other (See Comments)    Joint pain and ear ringing  . Atorvastatin Other (See Comments)    Pt states causes bilateral lower extremity muscle cramps  . Pravastatin Nausea And Vomiting and Other (See Comments)    Causes dizziness     Review of Systems    Objective:   BP 138/80 (BP Location: Left Arm, Patient Position: Sitting, Cuff Size: Large)   Pulse 74   Resp 12   Ht 5' 1.25" (1.556 m)   Wt 226 lb (102.5 kg)   BMI 42.35 kg/m   Physical Exam   Abscess area cleaned in sterile fashion. Dime sized opening with proteinaceous plug obstructing opening.  Surrounded by 6 cm of induration. And minimal erythema.  Minimal tenderness.  No significant fluctuance. Total of 6 cc of 2% lidocaine ultimately injected to subcutaneous tissue surrounding opening. Plug gently removed with 11 blade and hemostat.  No pocket of fluctuance to otherwise incise. Hemostat used to sound the opening below and gently break up tissue.  Minimal thin pustular/sanguinous fluid expressed. Short wick to pack small resulting pocket Dry dressing applied.   Assessment & Plan   Right abdominal wall abscess:  I and  D.   Change dressing at least daily and as needed for drainage. Continue warm packs Extending Augmentin for 5 more days. Call if worsens and on Monday for progress.

## 2019-05-13 ENCOUNTER — Other Ambulatory Visit: Payer: Self-pay | Admitting: Internal Medicine

## 2019-05-13 DIAGNOSIS — I10 Essential (primary) hypertension: Secondary | ICD-10-CM

## 2019-05-15 ENCOUNTER — Telehealth: Payer: Self-pay | Admitting: Internal Medicine

## 2019-05-15 NOTE — Telephone Encounter (Signed)
Patient called back a provided info where to fax form over. [facility] [employer]. Ph. [phone]  Fax [phone]  Routing to Cherice to Follow up.

## 2019-05-15 NOTE — Telephone Encounter (Signed)
Patient called stating she has not heard or received anything yet regarding her test trips. After information shared with Dr. Amil Amen called patient and informed Cherice has had fax form over multiples.patient was advise to call [employer] to find out if something different has to be done or the exact information for the person that is holding the order. Patient states will call one more time and will call us back to share information.

## 2019-05-20 ENCOUNTER — Other Ambulatory Visit: Payer: Self-pay

## 2019-05-21 NOTE — Telephone Encounter (Signed)
Spoke with patient . States the first time we sent in Rx for test strips and lancets patient had [employer]. Patient has since switched to [facility]. Patient never received test strips because she is no longer with [employer]. Informed I will call tomorrow and get her set up with [facility]. Patient states she is asking because she has not been feeling well the last 2 weeks. Patient states she is very fatigued, thirsty and wants to sleep a lot. Patient denies any other symptoms associated with COVID-19. Not sure if it is her diabetes or what.

## 2019-05-23 NOTE — Telephone Encounter (Signed)
Spoke with patient. Informed she will have to get a new glucometer and test strips.patient verbalized understanding and she is fine with new testing supplies. Form filled and faxed back to company.

## 2019-07-11 ENCOUNTER — Telehealth: Payer: Self-pay | Admitting: Internal Medicine

## 2019-07-11 NOTE — Telephone Encounter (Signed)
Patient called requesting test trips to be called in at [facility] on [address].  Please advise.

## 2019-07-16 ENCOUNTER — Other Ambulatory Visit: Payer: Self-pay

## 2019-07-16 MED ORDER — CONTOUR TEST VI STRP: ORAL_STRIP | 12 refills | Status: DC

## 2019-07-16 NOTE — Telephone Encounter (Signed)
Spoke with patient. She is using [employer] tests strips. States she is still using her old machine while she is waiting for the new one to arrive. Rx sent to [facility] on [address]. Patient has been informed.

## 2019-07-30 ENCOUNTER — Other Ambulatory Visit: Payer: Self-pay

## 2019-07-30 DIAGNOSIS — Z20822 Contact with and (suspected) exposure to covid-19: Secondary | ICD-10-CM

## 2019-08-01 LAB — NOVEL CORONAVIRUS, NAA: SARS-CoV-2, NAA: NOT DETECTED

## 2019-08-01 LAB — SPECIMEN STATUS REPORT

## 2019-08-18 ENCOUNTER — Other Ambulatory Visit: Payer: Self-pay

## 2019-08-18 DIAGNOSIS — I10 Essential (primary) hypertension: Secondary | ICD-10-CM

## 2019-08-18 MED ORDER — CETIRIZINE HCL 10 MG PO TABS: 10.0000 mg | ORAL_TABLET | Freq: Every day | ORAL | 3 refills | Status: DC

## 2019-08-18 MED ORDER — ROSUVASTATIN CALCIUM 10 MG PO TABS: 10.0000 mg | ORAL_TABLET | Freq: Every day | ORAL | 3 refills | Status: DC

## 2019-08-18 MED ORDER — AMLODIPINE BESYLATE 5 MG PO TABS: 5.0000 mg | ORAL_TABLET | Freq: Every day | ORAL | 3 refills | Status: DC

## 2019-08-18 MED ORDER — METOPROLOL TARTRATE 25 MG PO TABS: 25.0000 mg | ORAL_TABLET | Freq: Two times a day (BID) | ORAL | 3 refills | Status: DC

## 2019-09-02 ENCOUNTER — Telehealth: Payer: Self-pay | Admitting: *Deleted

## 2019-09-02 DIAGNOSIS — Z006 Encounter for examination for normal comparison and control in clinical research program: Secondary | ICD-10-CM

## 2019-09-02 NOTE — Telephone Encounter (Signed)
GOULD EDUInformed Consent  Subject Name:Haley [name]  Subject met inclusion and exclusion criteria. The informed consent form, study requirements and expectations were reviewed with the subject and questions and concerns were addressed prior to the signing of the consent form. The subject verbalized understanding of the trial requirements. The subject agreed to participate in the [place] and gave verbal consent to participate in the [place] 08/29/2019. The informed consent was obtained prior to performance of any protocol-specific procedures for the subject. A copy of the signed informed consent was mailedto the subject and a copy was placed in the subject's medical record.  Oletta Cohn.

## 2019-09-19 ENCOUNTER — Ambulatory Visit (INDEPENDENT_AMBULATORY_CARE_PROVIDER_SITE_OTHER): Payer: Medicaid Other

## 2019-09-19 DIAGNOSIS — Z23 Encounter for immunization: Secondary | ICD-10-CM | POA: Diagnosis not present

## 2019-10-28 ENCOUNTER — Other Ambulatory Visit: Payer: Self-pay | Admitting: Internal Medicine

## 2019-10-28 DIAGNOSIS — Z1231 Encounter for screening mammogram for malignant neoplasm of breast: Secondary | ICD-10-CM

## 2019-10-29 ENCOUNTER — Ambulatory Visit
Admission: RE | Admit: 2019-10-29 | Discharge: 2019-10-29 | Disposition: A | Discharge status: Home / Self Care | Payer: Medicare Other | Source: Ambulatory Visit | Attending: Internal Medicine | Admitting: Internal Medicine

## 2019-10-29 ENCOUNTER — Other Ambulatory Visit: Payer: Self-pay

## 2019-10-29 DIAGNOSIS — Z1231 Encounter for screening mammogram for malignant neoplasm of breast: Secondary | ICD-10-CM

## 2019-12-10 ENCOUNTER — Other Ambulatory Visit: Payer: Self-pay | Admitting: Internal Medicine

## 2019-12-17 ENCOUNTER — Ambulatory Visit (INDEPENDENT_AMBULATORY_CARE_PROVIDER_SITE_OTHER): Payer: Medicare Other | Admitting: Internal Medicine

## 2019-12-17 ENCOUNTER — Encounter: Payer: Self-pay | Admitting: Internal Medicine

## 2019-12-17 VITALS — BP 152/98 | HR 78 | Resp 12 | Ht 61.25 in | Wt 225.0 lb

## 2019-12-17 DIAGNOSIS — R5383 Other fatigue: Secondary | ICD-10-CM

## 2019-12-17 DIAGNOSIS — J3089 Other allergic rhinitis: Secondary | ICD-10-CM

## 2019-12-17 DIAGNOSIS — J0140 Acute pansinusitis, unspecified: Secondary | ICD-10-CM

## 2019-12-17 LAB — POC COVID19 BINAXNOW: SARS Coronavirus 2 Ag: NEGATIVE

## 2019-12-17 MED ORDER — AZITHROMYCIN 500 MG PO TABS: ORAL_TABLET | ORAL | 0 refills | Status: DC

## 2019-12-17 MED ORDER — MOMETASONE FUROATE 50 MCG/ACT NA SUSP: NASAL | 11 refills | Status: DC

## 2019-12-17 NOTE — Progress Notes (Signed)
      Subjective:    Patient ID: Haley [name], female   DOB: 23-Jul-1956, 64 y.o.   MRN: [id number]   HPI   Pain in left maxillary area last week.  Headache. Last week, developed episodes of vertigo.  Has had a day where she felt her equilibrium was off.   Left ear now hurting and into her throat. Had one day of throat scratchiness. No fever.   Clearing throat.   Tested negative just before allowed in clinic for COVID19--rapid testing.  Allergies  Allergen Reactions  . Praluent [Alirocumab]     Had significant fatigue and memory loss--was lost driving in town.  Joint pain and nausea as well.  Marland Kitchen Zetia [Ezetimibe] Other (See Comments)    Joint pain and ear ringing  . Atorvastatin Other (See Comments)    Pt states causes bilateral lower extremity muscle cramps  . Pravastatin Nausea And Vomiting and Other (See Comments)    Causes dizziness     Review of Systems    Objective:   Physical Exam  NAD HEENT:  PERRL, EOMI, tender over bilateral frontal and maxillary sinus areas.  TMs pearly gray.  Nasal mucosa swollen and red.  Throat without injection Neck:  Supple, No adenopathy Chest:  CTA CV:  RRR without murmur or rub  Radial and DP pulses normal and equal Abd:  S, NT, No HSM or mass.  Assessment & Plan   1.  Acute sinusitis and possibly seasonal allergies:  Rapid Covid testing negative as above.  Azithromycin 500 mg daily for 5 days.  Nasonex 2 sprays each nostril daily.  Drink fluids

## 2019-12-30 ENCOUNTER — Other Ambulatory Visit: Payer: Self-pay

## 2019-12-30 MED ORDER — FLUCONAZOLE 150 MG PO TABS: ORAL_TABLET | ORAL | 0 refills | Status: DC

## 2020-03-01 ENCOUNTER — Other Ambulatory Visit: Payer: Self-pay | Admitting: Internal Medicine

## 2020-03-03 ENCOUNTER — Encounter: Payer: Self-pay | Admitting: Gastroenterology

## 2020-03-03 ENCOUNTER — Other Ambulatory Visit: Payer: Self-pay

## 2020-03-03 ENCOUNTER — Ambulatory Visit (INDEPENDENT_AMBULATORY_CARE_PROVIDER_SITE_OTHER): Payer: Medicare Other | Admitting: Internal Medicine

## 2020-03-03 ENCOUNTER — Encounter: Payer: Self-pay | Admitting: Internal Medicine

## 2020-03-03 VITALS — BP 142/82 | HR 78 | Resp 12 | Ht 61.25 in | Wt 226.0 lb

## 2020-03-03 DIAGNOSIS — R5383 Other fatigue: Secondary | ICD-10-CM

## 2020-03-03 DIAGNOSIS — J3089 Other allergic rhinitis: Secondary | ICD-10-CM | POA: Insufficient documentation

## 2020-03-03 DIAGNOSIS — Z1211 Encounter for screening for malignant neoplasm of colon: Secondary | ICD-10-CM | POA: Diagnosis not present

## 2020-03-03 DIAGNOSIS — Z0000 Encounter for general adult medical examination without abnormal findings: Secondary | ICD-10-CM | POA: Diagnosis not present

## 2020-03-03 DIAGNOSIS — E7800 Pure hypercholesterolemia, unspecified: Secondary | ICD-10-CM

## 2020-03-03 DIAGNOSIS — I10 Essential (primary) hypertension: Secondary | ICD-10-CM

## 2020-03-03 DIAGNOSIS — E119 Type 2 diabetes mellitus without complications: Secondary | ICD-10-CM

## 2020-03-03 MED ORDER — FEXOFENADINE HCL 180 MG PO TABS: 180.0000 mg | ORAL_TABLET | Freq: Every day | ORAL | 11 refills | Status: DC

## 2020-03-03 NOTE — Patient Instructions (Addendum)
Can google "advance directives, [place]"  And bring up form from Secretary of [place]. Print and fill out Or can go to "5 wishes"  Which is also in Spanish and fill out--this costs $5--perhaps easier to use. Designate a Medical Power of Attorney to speak for you if you are unable to speak for yourself when ill or injured   You will need a pneumococcal 23 vaccine end of November.   As you know, encourage you to get a COvID 19 vaccine.

## 2020-03-03 NOTE — Progress Notes (Signed)
Subjective:    Patient ID: Haley [name], female   DOB: 1956/05/09, 64 y.o.   MRN: [id number]   HPI   CPE without pap  1.  Pap:  No history of abnormal pap prior to Laparoscopic abdominal hysterectomy.  2.  Mammogram:  Normal mammogram 12.23.2020.  No family history of breast cancer.  3.  Osteoprevention:  Normal DEXA in 09/2018.  Not exercising.  Not eating dairy every day.  Not taking Calcium or Vitamin D regularly.  Would be willing to drink Almond Milk, unsweetened 3 eight ounce cups daily.  4.  Guaiac Cards:  07/2018 and negative.    5.  Colonoscopy:  Had suboptimal colonoscopy due to poor prep 12.2018 with Dr. Silverio Decamp.  She has not set up a repeat as recommended in 2018.  6.  Immunizations:   Immunization History  Administered Date(s) Administered  . Influenza, High Dose Seasonal PF 07/24/2019  . Influenza, Quadrivalent, Recombinant, Inj, Pf 08/01/2016  . Influenza,inj,Quad PF,6+ Mos 12/28/2015, 07/27/2017, 08/04/2018  . Influenza-Unspecified 07/10/2017, 07/24/2019  . Pneumococcal Conjugate-13 09/19/2019  . Pneumococcal Polysaccharide-23 08/13/1996, 07/22/2013  . Td 11/12/1996  . Tdap 04/18/2016   Still not convinced she needs the COVID19 vaccine.  We have had multiple discussions about vaccination.  7.  Glucose/Cholesterol:  Did not come in for fasting labs during [place] last year.  Last labs 12.2019.  Has DM and hyperlipidemia.  Generally controlled    Current Meds  Medication Sig  . amLODipine (NORVASC) 5 MG tablet Take 1 tablet (5 mg total) by mouth daily.  Marland Kitchen aspirin 81 MG tablet Take 81 mg by mouth daily.  . cetirizine (ZYRTEC) 10 MG tablet Take 1 tablet (10 mg total) by mouth daily.  . Cholecalciferol (VITAMIN D) 2000 units CAPS Take by mouth. 1 daily  . GARLIC PO Take 829 mg by mouth. 2 daily  . glimepiride (AMARYL) 2 MG tablet TAKE 1 & 1/2 (ONE & ONE-HALF) TABLETS BY MOUTH ONCE DAILY WITH BREAKFAST  . lisinopril (ZESTRIL) 40 MG tablet Take 1 tablet (40  mg total) by mouth daily.  . meloxicam (MOBIC) 7.5 MG tablet Take 1 tablet (7.5 mg total) by mouth daily.  . metoprolol tartrate (LOPRESSOR) 25 MG tablet Take 1 tablet (25 mg total) by mouth 2 (two) times daily.  . Misc Natural Products (TART CHERRY ADVANCED PO) Take by mouth daily.  . mometasone (NASONEX) 50 MCG/ACT nasal spray USE 2 SPRAY(S) IN EACH NOSTRIL ONCE DAILY  . Multiple Vitamins-Minerals (MULTIVITAMIN WITH MINERALS) tablet Take 1 tablet by mouth daily.  . nitroGLYCERIN (NITROSTAT) 0.4 MG SL tablet DISSOLVE ONE TABLET UNDER THE TONGUE EVERY 5 MINUTES AS NEEDED FOR CHEST PAIN.  DO NOT EXCEED A TOTAL OF 3 DOSES IN 15 MINUTES  . omega-3 acid ethyl esters (LOVAZA) 1 g capsule Take 1 g by mouth 2 (two) times daily.  . rosuvastatin (CRESTOR) 10 MG tablet Take 1 tablet (10 mg total) by mouth daily.  . vitamin C (ASCORBIC ACID) 500 MG tablet Take 500 mg by mouth daily.  Marland Kitchen zinc gluconate 50 MG tablet Take 50 mg by mouth daily.   Allergies  Allergen Reactions  . Praluent [Alirocumab]     Had significant fatigue and memory loss--was lost driving in town.  Joint pain and nausea as well.  Marland Kitchen Zetia [Ezetimibe] Other (See Comments)    Joint pain and ear ringing  . Atorvastatin Other (See Comments)    Pt states causes bilateral lower extremity muscle cramps  .  Pravastatin Nausea And Vomiting and Other (See Comments)    Causes dizziness   Past Medical History:  Diagnosis Date  . Allergy   . Depression   . DM type 2, controlled, with complication (HCC) 07/22/2013   Type II DM diagnosed in ~ 2006 per pt hx.   . Ganglion cyst of wrist, right 05/09/2017  . Hyperlipidemia   . Hypertension   . Shingles   . Stroke [facility]) 2000   per pt. report    Past Surgical History:  Procedure Laterality Date  . ABDOMINAL HYSTERECTOMY  1994   Laparoscopic; ovaries still intact.  Performed for prolapsed uterus.  Marland Kitchen CARDIAC CATHETERIZATION N/A 10/23/2016   Procedure: Left Heart Cath and Coronary Angiography;   Surgeon: Kathleene Hazel, MD;  Location: [facility] INVASIVE CV LAB;  Service: Cardiovascular;  Laterality: N/A;  . CHOLECYSTECTOMY  1997   Laparoscopic  . COLONOSCOPY  2005-at age 71   in [place]-polyps removed per pt   . WISDOM TOOTH EXTRACTION      Family History  Problem Relation Age of Onset  . Diabetes Father   . Stroke Maternal Aunt        Multiple aunts died from strokes-maternal  . Hypertension Son   . Diabetes Son   . Hypertension Brother   . Diabetes Brother   . Diabetes Sister        prediabetic  . Cancer Sister 51       uterine cancer  . Seizures Brother   . Colon cancer Neg Hx   . Stomach cancer Neg Hx   . Breast cancer Neg Hx     Social History   Socioeconomic History  . Marital status: Single    Spouse name: Not on file  . Number of children: 2  . Years of education: 12+  . Highest education level: Some college, no degree  Occupational History  . Occupation: Print production planner    Comment: Retired now  Tobacco Use  . Smoking status: Never Smoker  . Smokeless tobacco: Never Used  Substance and Sexual Activity  . Alcohol use: Yes    Alcohol/week: 0.0 standard drinks    Comment: occasionally-1 to 2 drinks monthly   . Drug use: No  . Sexual activity: Never  Other Topics Concern  . Not on file  Social History Narrative   Did get some post high school training/education   Lives alone   Daughter lives in [place] and checks in regularly.   Social Determinants of Health   Financial Resource Strain:   . Difficulty of Paying Living Expenses:   Food Insecurity:   . Worried About Programme researcher, broadcasting/film/video in the Last Year:   . Barista in the Last Year:   Transportation Needs:   . Freight forwarder (Medical):   Marland Kitchen Lack of Transportation (Non-Medical):   Physical Activity:   . Days of Exercise per Week:   . Minutes of Exercise per Session:   Stress:   . Feeling of Stress :   Social Connections:   . Frequency of Communication with Friends and  Family:   . Frequency of Social Gatherings with Friends and Family:   . Attends Religious Services:   . Active Member of Clubs or Organizations:   . Attends Banker Meetings:   Marland Kitchen Marital Status:   Intimate Partner Violence:   . Fear of Current or Ex-Partner:   . Emotionally Abused:   Marland Kitchen Physically Abused:   . Sexually Abused:  Review of Systems  Constitutional: Positive for fatigue (Not getting out with pandemic--has become a couch potato.). Negative for appetite change (Eating more junk food.).  HENT: Positive for dental problem (Getting partial replaced), postnasal drip, rhinorrhea and sneezing. Negative for hearing loss.   Eyes: Positive for discharge (watery from allergies with pollen).  Respiratory: Negative for cough and shortness of breath.   Cardiovascular: Negative for chest pain (States having gas in high epigastrium/left chest and when belches, goes away.), palpitations and leg swelling.  Gastrointestinal: Positive for abdominal pain (With gas, some discomfort from time RLQ.). Negative for blood in stool (no melena), constipation and diarrhea.  Genitourinary: Positive for frequency. Negative for dysuria.  Musculoskeletal:       Left lateral thigh pain when sits too long.   Some pain at base of left thumb.  Skin: Negative for rash.  Neurological: Negative for weakness and numbness.  Psychiatric/Behavioral: Positive for dysphoric mood (Feeling down with pandemic.). The patient is not nervous/anxious.       Objective:   BP (!) 142/82 (BP Location: Left Arm, Patient Position: Sitting, Cuff Size: Large)   Pulse 78   Resp 12   Ht 5' 1.25" (1.556 m)   Wt 226 lb (102.5 kg)   BMI 42.35 kg/m   Physical Exam  Constitutional: She is oriented to person, place, and time. She appears well-developed and well-nourished.  HENT:  Head: Normocephalic and atraumatic.  Right Ear: Tympanic membrane and external ear normal.  Left Ear: Tympanic membrane, external ear  and ear canal normal.  Nose: Mucosal edema and rhinorrhea (clear) present.  Mouth/Throat: Oropharynx is clear and moist.  Eyes: Pupils are equal, round, and reactive to light. Conjunctivae and EOM are normal. Right eye exhibits discharge (Clear watery.). Left eye exhibits discharge (Clear watery.).  Neck: No thyromegaly present.  Cardiovascular: Normal rate, regular rhythm, S1 normal and S2 normal. Exam reveals no S3, no S4 and no friction rub.  No murmur heard. No carotid bruits.  Carotid, radial, femoral, DP and PT pulses normal and equal.   Pulmonary/Chest: Effort normal and breath sounds normal. Right breast exhibits no inverted nipple, no mass, no nipple discharge, no skin change and no tenderness. Left breast exhibits no inverted nipple, no mass, no nipple discharge, no skin change and no tenderness.  Abdominal: Soft. Bowel sounds are normal. She exhibits no mass. There is no hepatosplenomegaly. There is no abdominal tenderness. A hernia is present.    Genitourinary:    Genitourinary Comments: Normal external female genitalia:  No adnexal mass or tenderness.   Musculoskeletal:        General: Normal range of motion.     Cervical back: Full passive range of motion without pain, normal range of motion and neck supple.  Lymphadenopathy:       Head (right side): No submental and no submandibular adenopathy present.       Head (left side): No submental and no submandibular adenopathy present.    She has no cervical adenopathy.    She has no axillary adenopathy.       Right: No inguinal and no supraclavicular adenopathy present.       Left: No inguinal and no supraclavicular adenopathy present.  Neurological: She is alert and oriented to person, place, and time. She has normal strength and normal reflexes. No cranial nerve deficit or sensory deficit. Coordination and gait normal.  Diabetic foot exam was performed with the following findings:   No deformities, ulcerations, or other skin  breakdown  Normal sensation of 10g monofilament Intact posterior tibialis and dorsalis pedis pulses     Skin: Skin is warm. No rash noted.  Psychiatric: She has a normal mood and affect. Her speech is normal and behavior is normal. Judgment and thought content normal. Cognition and memory are normal.    Assessment & Plan   1.  CPE without pap Had mammogram 4 months ago and normal Referral for repeat screening colonoscopy as one in 2018 with suboptimal prep Had eye exam in October with Dr. Dione Booze Due for last Pneumococcal 23 vaccine after end of November.  CBC, CMP, FLP, A1C, Urine microalbumin/crea, TSH  2.  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  Encouraged her to get COVID vaccine so can safely get out more.  3.  Allergies:  Switch to Allegra from Zyrtec and continue Nasonex.  4.  Hypertension:  Controlled  5.  DM:  Labs  6.  Hypercholesterolemia:  Labs.

## 2020-03-04 LAB — COMPREHENSIVE METABOLIC PANEL
ALT: 20 IU/L (ref 0–32)
AST: 20 IU/L (ref 0–40)
Albumin/Globulin Ratio: 1.4 (ref 1.2–2.2)
Albumin: 4.1 g/dL (ref 3.8–4.8)
Alkaline Phosphatase: 65 IU/L (ref 39–117)
BUN/Creatinine Ratio: 8 — ABNORMAL LOW (ref 12–28)
BUN: 8 mg/dL (ref 8–27)
Bilirubin Total: 0.5 mg/dL (ref 0.0–1.2)
CO2: 26 mmol/L (ref 20–29)
Calcium: 9.9 mg/dL (ref 8.7–10.3)
Chloride: 102 mmol/L (ref 96–106)
Creatinine, Ser: 0.99 mg/dL (ref 0.57–1.00)
GFR calc Af Amer: 69 mL/min/{1.73_m2} (ref >59)
GFR calc non Af Amer: 60 mL/min/{1.73_m2} (ref >59)
Globulin, Total: 2.9 g/dL (ref 1.5–4.5)
Glucose: 85 mg/dL (ref 65–99)
Potassium: 4.3 mmol/L (ref 3.5–5.2)
Sodium: 141 mmol/L (ref 134–144)
Total Protein: 7 g/dL (ref 6.0–8.5)

## 2020-03-04 LAB — CBC WITH DIFFERENTIAL/PLATELET
Basophils Absolute: 0.1 10*3/uL (ref 0.0–0.2)
Basos: 1 %
EOS (ABSOLUTE): 0.1 10*3/uL (ref 0.0–0.4)
Eos: 2 %
Hematocrit: 43.1 % (ref 34.0–46.6)
Hemoglobin: 14.1 g/dL (ref 11.1–15.9)
Immature Grans (Abs): 0 10*3/uL (ref 0.0–0.1)
Immature Granulocytes: 0 %
Lymphocytes Absolute: 2.5 10*3/uL (ref 0.7–3.1)
Lymphs: 33 %
MCH: 31 pg (ref 26.6–33.0)
MCHC: 32.7 g/dL (ref 31.5–35.7)
MCV: 95 fL (ref 79–97)
Monocytes Absolute: 0.8 10*3/uL (ref 0.1–0.9)
Monocytes: 10 %
Neutrophils Absolute: 4.2 10*3/uL (ref 1.4–7.0)
Neutrophils: 54 %
Platelets: 273 10*3/uL (ref 150–450)
RBC: 4.55 x10E6/uL (ref 3.77–5.28)
RDW: 12.3 % (ref 11.7–15.4)
WBC: 7.6 10*3/uL (ref 3.4–10.8)

## 2020-03-04 LAB — MICROALBUMIN / CREATININE URINE RATIO
Creatinine, Urine: 44.9 mg/dL
Microalb/Creat Ratio: <7 mg/g creat (ref 0–29)
Microalbumin, Urine: <3 ug/mL

## 2020-03-04 LAB — LIPID PANEL W/O CHOL/HDL RATIO
Cholesterol, Total: 120 mg/dL (ref 100–199)
HDL: 52 mg/dL (ref >39)
LDL Chol Calc (NIH): 50 mg/dL (ref 0–99)
Triglycerides: 93 mg/dL (ref 0–149)
VLDL Cholesterol Cal: 18 mg/dL (ref 5–40)

## 2020-03-04 LAB — HGB A1C W/O EAG: Hgb A1c MFr Bld: 6.1 % — ABNORMAL HIGH (ref 4.8–5.6)

## 2020-03-04 LAB — TSH: TSH: 3.25 u[IU]/mL (ref 0.450–4.500)

## 2020-03-23 ENCOUNTER — Other Ambulatory Visit: Payer: Self-pay | Admitting: Internal Medicine

## 2020-04-16 ENCOUNTER — Other Ambulatory Visit: Payer: Self-pay

## 2020-04-16 ENCOUNTER — Ambulatory Visit (AMBULATORY_SURGERY_CENTER): Payer: Self-pay | Admitting: *Deleted

## 2020-04-16 VITALS — Ht 61.25 in | Wt 225.0 lb

## 2020-04-16 DIAGNOSIS — Z1211 Encounter for screening for malignant neoplasm of colon: Secondary | ICD-10-CM

## 2020-04-16 MED ORDER — SUTAB 1479-225-188 MG PO TABS: 24.0000 | ORAL_TABLET | ORAL | 0 refills | Status: DC

## 2020-04-16 NOTE — Progress Notes (Signed)

## 2020-04-19 ENCOUNTER — Telehealth: Payer: Self-pay | Admitting: Cardiology

## 2020-04-19 ENCOUNTER — Telehealth: Payer: Self-pay | Admitting: Gastroenterology

## 2020-04-19 DIAGNOSIS — Z1211 Encounter for screening for malignant neoplasm of colon: Secondary | ICD-10-CM

## 2020-04-19 MED ORDER — SUTAB 1479-225-188 MG PO TABS: 24.0000 | ORAL_TABLET | ORAL | 0 refills | Status: DC

## 2020-04-19 NOTE — Telephone Encounter (Signed)
Pt is calling in to schedule an appt in the near future for complaints of exertional chest pain for several months now. Pt states her best friend advised her to call and to quit ignoring her symptoms, since she has been having these issues for so long.  Pt states she has had intermittent episodes of exertional chest pain for almost 6 months now.  She states the chest pain occurs while exercising, walking up the stairs, lifting her grandchildren, or doing anything strenuous. Pt states she does not have any chest pain at rest.  Pt states when the episodes occur, she does get slightly dizzy, has mild sob, and does have to sit down at times, to alleviate the chest pain.  Pt states when the chest pain occurs, its mid-sternal in location.  Pt states  this does not radiate in nature.  She states when the episodes occur,she does not become diaphoretic, have N/V, or experience pre-syncopal or syncopal episodes. Pt states she has no lower extremity edema or any other swelling.  Pt states she has not utilized her Nitro when her CP occurs, for she states she can usually sit down and rest, and her symptoms go away.  Pt states she has been on an exercise regimen and has lost weight. She is being very mindful of what she eats and maintaining a low sodium diet. She states her BPs have been good running systolic 120s diastolic 70s and HR between 58-60s.  Pt states she is taking all her prescribed medications. Pt states she is not having any chest pain at this moment, and has not had any issues so far today.  Scheduled the pt to come in and see Dr. Delton See on this Thursday 6/17 at 0800.  Advised her to arrive 15 mins prior to this appt.  Advised the pt that if her symptoms return, worsen, or increase in nature, then she should seek immediate medical attention.  Pt education provided on what warrants immediate medical attention.  Informed the pt that I will send this message to Dr. Delton See as an Lorain Childes, to make her aware of  this plan. Pt education provided to her on how to use her Nitro as needed for chest pain, and when to call 911.  Pt verbalized understanding and agrees with this plan. Pt appreciative for all the assistance provided.

## 2020-04-19 NOTE — Telephone Encounter (Signed)
Resent Sutab RX to [facility] and notified pt of this

## 2020-04-19 NOTE — Telephone Encounter (Signed)
New Message    Pt c/o of Chest Pain: STAT if CP now or developed within 24 hours  1. Are you having CP right now? Not now, only happens when she gets upset about something, exercising and when she lifts anything heavy, like her grand kids, Or climbing stairs   2. Are you experiencing any other symptoms (ex. SOB, nausea, vomiting, sweating)? She gets SOB and sweating when she experiences the heaviness in her chest   3. How long have you been experiencing CP? 6-8 months   4. Is your CP continuous or coming and going? Eventually it does go away after maybe 20 minutes or so. If she stops what she is doing and sit still or lays down, it does go away   5. Have you taken Nitroglycerin? She says she should have used it, but she has not used it because it goes away  ?

## 2020-04-19 NOTE — Telephone Encounter (Signed)
Pt stated that [facility] pharmacy did not receive Sutab rx.  Please resend.

## 2020-04-20 MED ORDER — SUTAB 1479-225-188 MG PO TABS: 24.0000 | ORAL_TABLET | ORAL | 0 refills | Status: DC

## 2020-04-20 NOTE — Telephone Encounter (Signed)
Pt stated that [facility] still did not receive rx.  Please call in prescription.

## 2020-04-20 NOTE — Telephone Encounter (Signed)
Sutab  Script with [employer] code sent to [facility]

## 2020-04-20 NOTE — Addendum Note (Signed)
Addended by: Gillermina Hu on: 04/20/2020 10:43 AM   Modules accepted: Orders

## 2020-04-21 ENCOUNTER — Other Ambulatory Visit (INDEPENDENT_AMBULATORY_CARE_PROVIDER_SITE_OTHER): Payer: Medicare Other

## 2020-04-21 ENCOUNTER — Telehealth: Payer: Self-pay | Admitting: Cardiology

## 2020-04-21 DIAGNOSIS — R0981 Nasal congestion: Secondary | ICD-10-CM

## 2020-04-21 DIAGNOSIS — J029 Acute pharyngitis, unspecified: Secondary | ICD-10-CM | POA: Diagnosis not present

## 2020-04-21 LAB — POC COVID19 BINAXNOW: SARS Coronavirus 2 Ag: NEGATIVE

## 2020-04-21 NOTE — Telephone Encounter (Signed)
Patient called stating she took the COVID test and it is negative.

## 2020-04-21 NOTE — Telephone Encounter (Signed)
Pt is calling in to let us know that she went to see her PCP today and she did a rapid COVID test on her, and it was negative.  Pt states her PCP diagnosed her with allergies and has treated this.  PCP advised it is ok for her to keep her appt with Dr. Delton See, as scheduled for tomorrow 6/17 at 0800.  Advised the pt that we will see her then, and to wear her mask to the appt.  Pt verbalized understanding and agrees with this plan. Pt was more than gracious for all the assistance provided.

## 2020-04-21 NOTE — Telephone Encounter (Signed)
Pt is calling in to let Dr. Delton See and myself know that she woke up this morning with a bad head cold, sinus drainage, and cough.  Pt did not report having a fever.  She did not report acute CP or worsening SOB with new onset cold-like symptoms.  Pt states she is scheduled to see Dr. Delton See in clinic tomorrow 6/17, for her complaints of chest pain she called about on 6/14. Pt states she just wanted to give up as heads up about this.  Asked the pt if she is vaccinated for covid, and/or been around anyone with known COVID.   Pt states she is unsure if she has been around anyone with COVID, and she is NOT vaccinated.  Pt states she is more than likely going to get in with her PCP today, and if she does, her PCP will run a rapid covid test on her.  Pt states she would like for [place] to keep her scheduled appt to see Dr. Delton See for tomorrow, and she will call me back before 5 pm today, to report if she had her COVID test done by her PCP, and the results, and if she cannot obtain, she will have me cancel the appt and reschedule her to another day. Informed the pt that I will await her call back today before 5 pm, to tell us if she was able to get tested or not.  Informed the pt that we will cancel the appt or reschedule, based on if she was tested with results today.  Informed the pt that I will route this message to Dr. Delton See as a general FYI, to make her aware of this plan. Pt verbalized understanding and agrees with this plan.  She reports she will update me later on this afternoon.

## 2020-04-21 NOTE — Telephone Encounter (Signed)
New message:   Pt says she woke up with a head cold. She wants to know if she should  keep her appt with Dr Delton See tomorrow?

## 2020-04-22 ENCOUNTER — Other Ambulatory Visit: Payer: Self-pay

## 2020-04-22 ENCOUNTER — Encounter: Payer: Self-pay | Admitting: Cardiology

## 2020-04-22 ENCOUNTER — Ambulatory Visit (INDEPENDENT_AMBULATORY_CARE_PROVIDER_SITE_OTHER): Payer: Medicare Other | Admitting: Cardiology

## 2020-04-22 ENCOUNTER — Encounter: Payer: Self-pay | Admitting: *Deleted

## 2020-04-22 VITALS — BP 136/86 | HR 79 | Ht 61.0 in | Wt 223.4 lb

## 2020-04-22 DIAGNOSIS — I10 Essential (primary) hypertension: Secondary | ICD-10-CM | POA: Diagnosis not present

## 2020-04-22 DIAGNOSIS — R079 Chest pain, unspecified: Secondary | ICD-10-CM

## 2020-04-22 DIAGNOSIS — E782 Mixed hyperlipidemia: Secondary | ICD-10-CM

## 2020-04-22 DIAGNOSIS — Z01812 Encounter for preprocedural laboratory examination: Secondary | ICD-10-CM

## 2020-04-22 DIAGNOSIS — R0600 Dyspnea, unspecified: Secondary | ICD-10-CM

## 2020-04-22 DIAGNOSIS — I2510 Atherosclerotic heart disease of native coronary artery without angina pectoris: Secondary | ICD-10-CM

## 2020-04-22 DIAGNOSIS — R0609 Other forms of dyspnea: Secondary | ICD-10-CM

## 2020-04-22 LAB — CBC
Hematocrit: 44.9 % (ref 34.0–46.6)
Hemoglobin: 14.5 g/dL (ref 11.1–15.9)
MCH: 30.4 pg (ref 26.6–33.0)
MCHC: 32.3 g/dL (ref 31.5–35.7)
MCV: 94 fL (ref 79–97)
Platelets: 280 10*3/uL (ref 150–450)
RBC: 4.77 x10E6/uL (ref 3.77–5.28)
RDW: 12.5 % (ref 11.7–15.4)
WBC: 4.7 10*3/uL (ref 3.4–10.8)

## 2020-04-22 LAB — BASIC METABOLIC PANEL
BUN/Creatinine Ratio: 11 — ABNORMAL LOW (ref 12–28)
BUN: 13 mg/dL (ref 8–27)
CO2: 25 mmol/L (ref 20–29)
Calcium: 10.2 mg/dL (ref 8.7–10.3)
Chloride: 101 mmol/L (ref 96–106)
Creatinine, Ser: 1.2 mg/dL — ABNORMAL HIGH (ref 0.57–1.00)
GFR calc Af Amer: 54 mL/min/{1.73_m2} — ABNORMAL LOW (ref >59)
GFR calc non Af Amer: 47 mL/min/{1.73_m2} — ABNORMAL LOW (ref >59)
Glucose: 119 mg/dL — ABNORMAL HIGH (ref 65–99)
Potassium: 3.9 mmol/L (ref 3.5–5.2)
Sodium: 140 mmol/L (ref 134–144)

## 2020-04-22 NOTE — H&P (View-Only) (Signed)
Cardiology Office Note    Date:  04/22/2020   ID:  Haley [name], DOB [birth year], MRN [id number]  PCP:  Mack Hook, MD  Cardiologist: Ena Dawley, MD EPS: None  Chief complaint: Dyspnea on exertion, chest pain  History of Present Illness:  Haley [name] is a 64 y.o. female with history of coronary CTA in 2017 with calcium score of 239 which was 93 percentile, moderate diffuse CAD possible obstructive.  Cardiac catheterization 10/2016 mild nonobstructive disease in the LAD, moderate stenosis in the mid to distal PDA 50% normal LV function medical therapy recommended.  Has been on Praluent managed by lipid clinic.  Patient last saw Dr. Meda Coffee 11/2016 which time she was doing well.  48-hour monitor showed PVC's in past.  Patient also has hypertension.  The patient is coming after 2 years, in the last year she has developed worsening dyspnea on exertion to the point that she had to move from 3rd-1st floor because she was unable to walk stairs.  She gets chest tightness radiating to her arm every time she walks.  This is associated with some dizziness.  She has an tried sublingual nitroglycerin.  She has been compliant with her medications and is tolerating them well.  She brings blood pressure diary and her blood pressures are all under 130 mmHg.   Past Medical History:  Diagnosis Date  . Allergy   . Back pain   . Depression   . DM type 2, controlled, with complication ([place]) 0/94/7096   Type II DM diagnosed in ~ 2006 per pt hx.   . Ganglion cyst of wrist, right 05/09/2017  . Hyperlipidemia   . Hypertension   . Itching   . Shingles   . Stroke [facility]) 2000   per pt. report    Past Surgical History:  Procedure Laterality Date  . ABDOMINAL HYSTERECTOMY  1994   Laparoscopic; ovaries still intact.  Performed for prolapsed uterus.  Marland Kitchen CARDIAC CATHETERIZATION N/A 10/23/2016   Procedure: Left Heart Cath and Coronary Angiography;  Surgeon: Burnell Blanks, MD;   Location: [place] [facility];  Service: Cardiovascular;  Laterality: N/A;  . CHOLECYSTECTOMY  1997   Laparoscopic  . COLONOSCOPY  2005-at age 45   in [place]-polyps removed per pt   . POLYPECTOMY    . WISDOM TOOTH EXTRACTION      Current Medications: Current Meds  Medication Sig  . amLODipine (NORVASC) 5 MG tablet Take 1 tablet (5 mg total) by mouth daily.  Marland Kitchen aspirin 81 MG tablet Take 81 mg by mouth daily.  . cetirizine (ZYRTEC) 10 MG tablet Take 1 tablet (10 mg total) by mouth daily.  . Cholecalciferol (VITAMIN D) 2000 units CAPS Take by mouth. 1 daily  . fexofenadine (ALLEGRA) 180 MG tablet Take 1 tablet (180 mg total) by mouth daily.  Marland Kitchen GARLIC PO Take 283 mg by mouth. 2 daily  . glimepiride (AMARYL) 2 MG tablet TAKE 1 & 1/2 (ONE & ONE-HALF) TABLETS BY MOUTH ONCE DAILY WITH BREAKFAST  . lisinopril (ZESTRIL) 40 MG tablet Take 1 tablet by mouth once daily  . meloxicam (MOBIC) 7.5 MG tablet Take 7.5 mg by mouth as needed for pain.  . metoprolol tartrate (LOPRESSOR) 25 MG tablet Take 1 tablet (25 mg total) by mouth 2 (two) times daily.  . Misc Natural Products (TART CHERRY ADVANCED PO) Take by mouth daily.  . mometasone (NASONEX) 50 MCG/ACT nasal spray USE 2 SPRAY(S) IN EACH NOSTRIL ONCE DAILY  . Multiple  Vitamins-Minerals (MULTIVITAMIN WITH MINERALS) tablet Take 1 tablet by mouth daily.  . nitroGLYCERIN (NITROSTAT) 0.4 MG SL tablet DISSOLVE ONE TABLET UNDER THE TONGUE EVERY 5 MINUTES AS NEEDED FOR CHEST PAIN.  DO NOT EXCEED A TOTAL OF 3 DOSES IN 15 MINUTES  . omega-3 acid ethyl esters (LOVAZA) 1 g capsule Take 1 g by mouth 2 (two) times daily.  . rosuvastatin (CRESTOR) 10 MG tablet Take 1 tablet (10 mg total) by mouth daily.  . Sodium Sulfate-Mag Sulfate-KCl (SUTAB) [phone] MG TABS Take 24 tablets by mouth as directed.  . Sodium Sulfate-Mag Sulfate-KCl (SUTAB) [phone] MG TABS Take 24 tablets by mouth as directed.  . vitamin C (ASCORBIC ACID) 500 MG tablet Take 500 mg by  mouth daily.  Marland Kitchen zinc gluconate 50 MG tablet Take 50 mg by mouth daily.     Allergies:   Praluent [alirocumab], Zetia [ezetimibe], Atorvastatin, and Pravastatin   Social History   Socioeconomic History  . Marital status: Single    Spouse name: Not on file  . Number of children: 2  . Years of education: 12+  . Highest education level: Some college, no degree  Occupational History  . Occupation: Print production planner    Comment: Retired now  Tobacco Use  . Smoking status: Never Smoker  . Smokeless tobacco: Never Used  Vaping Use  . Vaping Use: Never used  Substance and Sexual Activity  . Alcohol use: Yes    Alcohol/week: 0.0 standard drinks    Comment: occasionally-1 to 2 drinks monthly   . Drug use: No  . Sexual activity: Never  Other Topics Concern  . Not on file  Social History Narrative   Did get some post high school training/education   Lives alone   Daughter lives in [place] and checks in regularly.   Social Determinants of Health   Financial Resource Strain:   . Difficulty of Paying Living Expenses:   Food Insecurity:   . Worried About Programme researcher, broadcasting/film/video in the Last Year:   . Barista in the Last Year:   Transportation Needs:   . Freight forwarder (Medical):   Marland Kitchen Lack of Transportation (Non-Medical):   Physical Activity:   . Days of Exercise per Week:   . Minutes of Exercise per Session:   Stress:   . Feeling of Stress :   Social Connections:   . Frequency of Communication with Friends and Family:   . Frequency of Social Gatherings with Friends and Family:   . Attends Religious Services:   . Active Member of Clubs or Organizations:   . Attends Banker Meetings:   Marland Kitchen Marital Status:      Family History:  The patient's family history includes Cancer (age of onset: 55) in her sister; Diabetes in her brother, father, sister, and son; Hypertension in her brother and son; Seizures in her brother; Stroke in her maternal aunt.   ROS:    Please see the history of present illness.    Review of Systems  Constitutional: Negative.  HENT: Negative.   Eyes: Negative.   Cardiovascular: Positive for chest pain, dyspnea on exertion and palpitations.  Respiratory: Positive for snoring and wheezing.   Hematologic/Lymphatic: Negative.   Musculoskeletal: Positive for back pain and myalgias. Negative for joint pain.  Gastrointestinal: Positive for constipation.  Genitourinary: Negative.   Neurological: Negative.    All other systems reviewed and are negative.   PHYSICAL EXAM:   VS:  BP 136/86   Pulse  79   Ht 5\' 1"  (1.549 m)   Wt 223 lb 6.4 oz (101.3 kg)   SpO2 93%   BMI 42.21 kg/m   Physical Exam  GEN: Obese, in no acute distress  Neck: no JVD, carotid bruits, or masses Cardiac:RRR; no murmurs, rubs, or gallops  Respiratory:  clear to auscultation bilaterally, normal work of breathing GI: soft, nontender, nondistended, + BS Ext: without cyanosis, clubbing, or edema, Good distal pulses bilaterally Neuro:  Alert and Oriented x 3 Psych: euthymic mood, full affect  Wt Readings from Last 3 Encounters:  04/22/20 223 lb 6.4 oz (101.3 kg)  04/16/20 225 lb (102.1 kg)  03/03/20 226 lb (102.5 kg)      Studies/Labs Reviewed:   EKG:  EKG is not ordered today.  The ekg reviewed from 10/08/2018 normal sinus rhythm nonspecific ST-T wave changes, no acute change Recent Labs: 03/03/2020: ALT 20; BUN 8; Creatinine, Ser 0.99; Hemoglobin 14.1; Platelets 273; Potassium 4.3; Sodium 141; TSH 3.250   Lipid Panel    Component Value Date/Time   CHOL 120 03/03/2020 1144   TRIG 93 03/03/2020 1144   HDL 52 03/03/2020 1144   LDLCALC 50 03/03/2020 1144   LDLDIRECT 134 (H) 07/22/2013 1620    Additional studies/ records that were reviewed today include:  Coronary CTA 11/2017IMPRESSION: 1. Coronary calcium score of 239. This was 9 percentile for age and sex matched control.   2. Normal coronary origin with right dominance.   3.  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 We will send additional analysis for CT FFT evaluation once available.   4. Mildly dilated pulmonary artery measuring 31 x 27 mm suspicious for pulmonary hypertension.   83     Electronically Signed   By: Tobias Alexander   On: 08/15/2016 21:18    Cardiac catheterization 12/2017The left ventricular systolic function is normal.  LV end diastolic pressure is normal.  The left ventricular ejection fraction is greater than 65% by visual estimate.  There is no mitral valve regurgitation.  RPDA lesion, 50 %stenosed.  Ost LAD to Prox LAD lesion, 20 %stenosed.   1. Mild non-obstructive disease in the LAD 2. Moderate stenosis in the mid to distal segment of the moderate caliber posterior descending artery. The vessel becomes small in caliber just after the stenosis. This does not appear to be flow limiting.  3. Normal LV systolic function   Recommendations: Medical management of CAD.    ASSESSMENT:    1. Coronary artery disease involving native coronary artery of native heart without angina pectoris   2. Essential hypertension   3. Mixed hyperlipidemia   4. Chest pain, unspecified type   5. DOE (dyspnea on exertion)   6. Pre-procedure lab exam    PLAN:  In order of problems listed above:  CAD status post cath showed nonobstructive CAD 10/2016 medical therapy recommended, now increased exertional chest pain and dyspnea on exertion, plan for repeat cardiac catheterization.  We will obtain repeat echocardiogram.  LVEF 55% in 2015.  Her EKG today shows sinus rhythm, low voltage, nonspecific ST-T wave abnormalities.  Essential hypertension blood pressure well controlled on current regimen.  Hyperlipidemia LDL was 55 07/2018 on Crestor and fish oil, HDL 52, triglycerides 93.  Palpitations have improved some since metoprolol added.  Obesity BMI 42.  Weight loss recommended  Diabetes mellitus hemoglobin A1c was 6.2, treated  by PCP Medication Adjustments/Labs and Tests Ordered: Current medicines are reviewed at length with the patient today.  Concerns regarding medicines are  outlined above.  Medication changes, Labs and Tests ordered today are listed in the Patient Instructions below. Patient Instructions  Medication Instructions:   Your physician recommends that you continue on your current medications as directed. Please refer to the Current Medication list given to you today.  *If you need a refill on your cardiac medications before your next appointment, please call your pharmacy*  Lab Work:  [facility] LABS--BMET AND CBC  If you have labs (blood work) drawn today and your tests are completely normal, you will receive your results only by: Marland Kitchen MyChart Message (if you have MyChart) OR . A paper copy in the mail If you have any lab test that is abnormal or we need to change your treatment, we will call you to review the results.  Testing/Procedures:  Your physician has requested that you have an echocardiogram. Echocardiography is a painless test that uses sound waves to create images of your heart. It provides your doctor with information about the size and shape of your heart and how well your heart's chambers and valves are working. This procedure takes approximately one hour. There are no restrictions for this procedure.   Your physician has requested that you have a cardiac catheterization. Cardiac catheterization is used to diagnose and/or treat various heart conditions. Doctors may recommend this procedure for a number of different reasons. The most common reason is to evaluate chest pain. Chest pain can be a symptom of coronary artery disease (CAD), and cardiac catheterization can show whether plaque is narrowing or blocking your heart's arteries. This procedure is also used to evaluate the valves, as well as measure the blood flow and oxygen levels in different parts of your heart. For further  information please visit [email or web address] Please follow instruction sheet, as given.  YOUR CARDIAC CATH IS SCHEDULED FOR Tuesday 05/04/20 AT 5:30 AM WITH DR. [place] TO DO AT [place] [facility]--PLEASE FOLLOW INSTRUCTION LETTER PROVIDED TO YOU TODAY TO HELP PREPARE FOR YOUR PROCEDURE.    Follow-Up:  WILL BE BASED ON YOUR CARDIAC CATH RESULTS--THIS WILL BE ARRANGED AFTER YOU ARE DISCHARGED FROM THE HOSPITAL FROM THIS PROCEDURE.      Signed, Tobias Alexander, MD  04/22/2020 9:05 AM    [facility] [address] [place], [place], [place]  [address] Phone: [phone]; Fax: [phone]

## 2020-04-22 NOTE — Addendum Note (Signed)
Addended by: Lars Masson on: 04/22/2020 03:28 PM   Modules accepted: Orders, SmartSet

## 2020-04-22 NOTE — Patient Instructions (Addendum)
Medication Instructions:   Your physician recommends that you continue on your current medications as directed. Please refer to the Current Medication list given to you today.  *If you need a refill on your cardiac medications before your next appointment, please call your pharmacy*  Lab Work:  [facility] LABS--BMET AND CBC  If you have labs (blood work) drawn today and your tests are completely normal, you will receive your results only by: Marland Kitchen MyChart Message (if you have MyChart) OR . A paper copy in the mail If you have any lab test that is abnormal or we need to change your treatment, we will call you to review the results.  Testing/Procedures:  Your physician has requested that you have an echocardiogram. Echocardiography is a painless test that uses sound waves to create images of your heart. It provides your doctor with information about the size and shape of your heart and how well your heart's chambers and valves are working. This procedure takes approximately one hour. There are no restrictions for this procedure.   Your physician has requested that you have a cardiac catheterization. Cardiac catheterization is used to diagnose and/or treat various heart conditions. Doctors may recommend this procedure for a number of different reasons. The most common reason is to evaluate chest pain. Chest pain can be a symptom of coronary artery disease (CAD), and cardiac catheterization can show whether plaque is narrowing or blocking your heart's arteries. This procedure is also used to evaluate the valves, as well as measure the blood flow and oxygen levels in different parts of your heart. For further information please visit [email or web address] Please follow instruction sheet, as given.  YOUR CARDIAC CATH IS SCHEDULED FOR Tuesday 05/04/20 AT 5:30 AM WITH DR. [place] TO DO AT [place]--PLEASE FOLLOW INSTRUCTION LETTER PROVIDED TO YOU TODAY TO HELP PREPARE FOR YOUR  PROCEDURE.    Follow-Up:  WILL BE BASED ON YOUR CARDIAC CATH RESULTS--THIS WILL BE ARRANGED AFTER YOU ARE DISCHARGED FROM THE HOSPITAL FROM THIS PROCEDURE.

## 2020-04-22 NOTE — Progress Notes (Signed)
Cardiology Office Note    Date:  04/22/2020   ID:  Haley [name], DOB [birth year], MRN [id number]  PCP:  Mack Hook, MD  Cardiologist: Ena Dawley, MD EPS: None  Chief complaint: Dyspnea on exertion, chest pain  History of Present Illness:  Haley [name] is a 64 y.o. female with history of coronary CTA in 2017 with calcium score of 239 which was 93 percentile, moderate diffuse CAD possible obstructive.  Cardiac catheterization 10/2016 mild nonobstructive disease in the LAD, moderate stenosis in the mid to distal PDA 50% normal LV function medical therapy recommended.  Has been on Praluent managed by lipid clinic.  Patient last saw Dr. Meda Coffee 11/2016 which time she was doing well.  48-hour monitor showed PVC's in past.  Patient also has hypertension.  The patient is coming after 2 years, in the last year she has developed worsening dyspnea on exertion to the point that she had to move from 3rd-1st floor because she was unable to walk stairs.  She gets chest tightness radiating to her arm every time she walks.  This is associated with some dizziness.  She has an tried sublingual nitroglycerin.  She has been compliant with her medications and is tolerating them well.  She brings blood pressure diary and her blood pressures are all under 130 mmHg.   Past Medical History:  Diagnosis Date  . Allergy   . Back pain   . Depression   . DM type 2, controlled, with complication ([place]) 0/94/7096   Type II DM diagnosed in ~ 2006 per pt hx.   . Ganglion cyst of wrist, right 05/09/2017  . Hyperlipidemia   . Hypertension   . Itching   . Shingles   . Stroke [facility]) 2000   per pt. report    Past Surgical History:  Procedure Laterality Date  . ABDOMINAL HYSTERECTOMY  1994   Laparoscopic; ovaries still intact.  Performed for prolapsed uterus.  Marland Kitchen CARDIAC CATHETERIZATION N/A 10/23/2016   Procedure: Left Heart Cath and Coronary Angiography;  Surgeon: Burnell Blanks, MD;   Location: [place] [facility];  Service: Cardiovascular;  Laterality: N/A;  . CHOLECYSTECTOMY  1997   Laparoscopic  . COLONOSCOPY  2005-at age 45   in [place]-polyps removed per pt   . POLYPECTOMY    . WISDOM TOOTH EXTRACTION      Current Medications: Current Meds  Medication Sig  . amLODipine (NORVASC) 5 MG tablet Take 1 tablet (5 mg total) by mouth daily.  Marland Kitchen aspirin 81 MG tablet Take 81 mg by mouth daily.  . cetirizine (ZYRTEC) 10 MG tablet Take 1 tablet (10 mg total) by mouth daily.  . Cholecalciferol (VITAMIN D) 2000 units CAPS Take by mouth. 1 daily  . fexofenadine (ALLEGRA) 180 MG tablet Take 1 tablet (180 mg total) by mouth daily.  Marland Kitchen GARLIC PO Take 283 mg by mouth. 2 daily  . glimepiride (AMARYL) 2 MG tablet TAKE 1 & 1/2 (ONE & ONE-HALF) TABLETS BY MOUTH ONCE DAILY WITH BREAKFAST  . lisinopril (ZESTRIL) 40 MG tablet Take 1 tablet by mouth once daily  . meloxicam (MOBIC) 7.5 MG tablet Take 7.5 mg by mouth as needed for pain.  . metoprolol tartrate (LOPRESSOR) 25 MG tablet Take 1 tablet (25 mg total) by mouth 2 (two) times daily.  . Misc Natural Products (TART CHERRY ADVANCED PO) Take by mouth daily.  . mometasone (NASONEX) 50 MCG/ACT nasal spray USE 2 SPRAY(S) IN EACH NOSTRIL ONCE DAILY  . Multiple  Vitamins-Minerals (MULTIVITAMIN WITH MINERALS) tablet Take 1 tablet by mouth daily.  . nitroGLYCERIN (NITROSTAT) 0.4 MG SL tablet DISSOLVE ONE TABLET UNDER THE TONGUE EVERY 5 MINUTES AS NEEDED FOR CHEST PAIN.  DO NOT EXCEED A TOTAL OF 3 DOSES IN 15 MINUTES  . omega-3 acid ethyl esters (LOVAZA) 1 g capsule Take 1 g by mouth 2 (two) times daily.  . rosuvastatin (CRESTOR) 10 MG tablet Take 1 tablet (10 mg total) by mouth daily.  . Sodium Sulfate-Mag Sulfate-KCl (SUTAB) 1479-225-188 MG TABS Take 24 tablets by mouth as directed.  . Sodium Sulfate-Mag Sulfate-KCl (SUTAB) 1479-225-188 MG TABS Take 24 tablets by mouth as directed.  . vitamin C (ASCORBIC ACID) 500 MG tablet Take 500 mg by  mouth daily.  . zinc gluconate 50 MG tablet Take 50 mg by mouth daily.     Allergies:   Praluent [alirocumab], Zetia [ezetimibe], Atorvastatin, and Pravastatin   Social History   Socioeconomic History  . Marital status: Single    Spouse name: Not on file  . Number of children: 2  . Years of education: 12+  . Highest education level: Some college, no degree  Occupational History  . Occupation: Leasing Agent    Comment: Retired now  Tobacco Use  . Smoking status: Never Smoker  . Smokeless tobacco: Never Used  Vaping Use  . Vaping Use: Never used  Substance and Sexual Activity  . Alcohol use: Yes    Alcohol/week: 0.0 standard drinks    Comment: occasionally-1 to 2 drinks monthly   . Drug use: No  . Sexual activity: Never  Other Topics Concern  . Not on file  Social History Narrative   Did get some post high school training/education   Lives alone   Daughter lives in [place] and checks in regularly.   Social Determinants of Health   Financial Resource Strain:   . Difficulty of Paying Living Expenses:   Food Insecurity:   . Worried About Running Out of Food in the Last Year:   . Ran Out of Food in the Last Year:   Transportation Needs:   . Lack of Transportation (Medical):   . Lack of Transportation (Non-Medical):   Physical Activity:   . Days of Exercise per Week:   . Minutes of Exercise per Session:   Stress:   . Feeling of Stress :   Social Connections:   . Frequency of Communication with Friends and Family:   . Frequency of Social Gatherings with Friends and Family:   . Attends Religious Services:   . Active Member of Clubs or Organizations:   . Attends Club or Organization Meetings:   . Marital Status:      Family History:  The patient's family history includes Cancer (age of onset: 40) in her sister; Diabetes in her brother, father, sister, and son; Hypertension in her brother and son; Seizures in her brother; Stroke in her maternal aunt.   ROS:    Please see the history of present illness.    Review of Systems  Constitutional: Negative.  HENT: Negative.   Eyes: Negative.   Cardiovascular: Positive for chest pain, dyspnea on exertion and palpitations.  Respiratory: Positive for snoring and wheezing.   Hematologic/Lymphatic: Negative.   Musculoskeletal: Positive for back pain and myalgias. Negative for joint pain.  Gastrointestinal: Positive for constipation.  Genitourinary: Negative.   Neurological: Negative.    All other systems reviewed and are negative.   PHYSICAL EXAM:   VS:  BP 136/86   Pulse   79   Ht 5' 1" (1.549 m)   Wt 223 lb 6.4 oz (101.3 kg)   SpO2 93%   BMI 42.21 kg/m   Physical Exam  GEN: Obese, in no acute distress  Neck: no JVD, carotid bruits, or masses Cardiac:RRR; no murmurs, rubs, or gallops  Respiratory:  clear to auscultation bilaterally, normal work of breathing GI: soft, nontender, nondistended, + BS Ext: without cyanosis, clubbing, or edema, Good distal pulses bilaterally Neuro:  Alert and Oriented x 3 Psych: euthymic mood, full affect  Wt Readings from Last 3 Encounters:  04/22/20 223 lb 6.4 oz (101.3 kg)  04/16/20 225 lb (102.1 kg)  03/03/20 226 lb (102.5 kg)      Studies/Labs Reviewed:   EKG:  EKG is not ordered today.  The ekg reviewed from 10/08/2018 normal sinus rhythm nonspecific ST-T wave changes, no acute change Recent Labs: 03/03/2020: ALT 20; BUN 8; Creatinine, Ser 0.99; Hemoglobin 14.1; Platelets 273; Potassium 4.3; Sodium 141; TSH 3.250   Lipid Panel    Component Value Date/Time   CHOL 120 03/03/2020 1144   TRIG 93 03/03/2020 1144   HDL 52 03/03/2020 1144   LDLCALC 50 03/03/2020 1144   LDLDIRECT 134 (H) 07/22/2013 1620    Additional studies/ records that were reviewed today include:  Coronary CTA 11/2017IMPRESSION: 1. Coronary calcium score of 239. This was 93 percentile for age and sex matched control.   2. Normal coronary origin with right dominance.   3.  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 We will send additional analysis for CT FFT evaluation once available.   4. Mildly dilated pulmonary artery measuring 31 x 27 mm suspicious for pulmonary hypertension.   Auria Mckinlay Kimbrough     Electronically Signed   By: Rosabel Sermeno  Parcell   On: 08/15/2016 21:18    Cardiac catheterization 12/2017The left ventricular systolic function is normal.  LV end diastolic pressure is normal.  The left ventricular ejection fraction is greater than 65% by visual estimate.  There is no mitral valve regurgitation.  RPDA lesion, 50 %stenosed.  Ost LAD to Prox LAD lesion, 20 %stenosed.   1. Mild non-obstructive disease in the LAD 2. Moderate stenosis in the mid to distal segment of the moderate caliber posterior descending artery. The vessel becomes small in caliber just after the stenosis. This does not appear to be flow limiting.  3. Normal LV systolic function   Recommendations: Medical management of CAD.    ASSESSMENT:    1. Coronary artery disease involving native coronary artery of native heart without angina pectoris   2. Essential hypertension   3. Mixed hyperlipidemia   4. Chest pain, unspecified type   5. DOE (dyspnea on exertion)   6. Pre-procedure lab exam    PLAN:  In order of problems listed above:  CAD status post cath showed nonobstructive CAD 10/2016 medical therapy recommended, now increased exertional chest pain and dyspnea on exertion, plan for repeat cardiac catheterization.  We will obtain repeat echocardiogram.  LVEF 55% in 2015.  Her EKG today shows sinus rhythm, low voltage, nonspecific ST-T wave abnormalities.  Essential hypertension blood pressure well controlled on current regimen.  Hyperlipidemia LDL was 55 07/2018 on Crestor and fish oil, HDL 52, triglycerides 93.  Palpitations have improved some since metoprolol added.  Obesity BMI 42.  Weight loss recommended  Diabetes mellitus hemoglobin A1c was 6.2, treated  by PCP Medication Adjustments/Labs and Tests Ordered: Current medicines are reviewed at length with the patient today.  Concerns regarding medicines are   outlined above.  Medication changes, Labs and Tests ordered today are listed in the Patient Instructions below. Patient Instructions  Medication Instructions:   Your physician recommends that you continue on your current medications as directed. Please refer to the Current Medication list given to you today.  *If you need a refill on your cardiac medications before your next appointment, please call your pharmacy*  Lab Work:  TODAY--PRE-CATH LABS--BMET AND CBC  If you have labs (blood work) drawn today and your tests are completely normal, you will receive your results only by: . MyChart Message (if you have MyChart) OR . A paper copy in the mail If you have any lab test that is abnormal or we need to change your treatment, we will call you to review the results.  Testing/Procedures:  Your physician has requested that you have an echocardiogram. Echocardiography is a painless test that uses sound waves to create images of your heart. It provides your doctor with information about the size and shape of your heart and how well your heart's chambers and valves are working. This procedure takes approximately one hour. There are no restrictions for this procedure.   Your physician has requested that you have a cardiac catheterization. Cardiac catheterization is used to diagnose and/or treat various heart conditions. Doctors may recommend this procedure for a number of different reasons. The most common reason is to evaluate chest pain. Chest pain can be a symptom of coronary artery disease (CAD), and cardiac catheterization can show whether plaque is narrowing or blocking your heart's arteries. This procedure is also used to evaluate the valves, as well as measure the blood flow and oxygen levels in different parts of your heart. For further  information please visit www.cardiosmart.org. Please follow instruction sheet, as given.  YOUR CARDIAC CATH IS SCHEDULED FOR Tuesday 05/04/20 AT 5:30 AM WITH DR. JORDAN TO DO AT [facility]--PLEASE FOLLOW INSTRUCTION LETTER PROVIDED TO YOU TODAY TO HELP PREPARE FOR YOUR PROCEDURE.    Follow-Up:  WILL BE BASED ON YOUR CARDIAC CATH RESULTS--THIS WILL BE ARRANGED AFTER YOU ARE DISCHARGED FROM THE HOSPITAL FROM THIS PROCEDURE.      Signed, Hawkins Seaman Scouten, MD  04/22/2020 9:05 AM    [facility] [address], [place], [place]  [address] Phone: [phone]; Fax: [phone]    

## 2020-04-28 ENCOUNTER — Other Ambulatory Visit: Payer: Self-pay | Admitting: Gastroenterology

## 2020-04-28 ENCOUNTER — Ambulatory Visit (INDEPENDENT_AMBULATORY_CARE_PROVIDER_SITE_OTHER): Payer: Medicare Other

## 2020-04-28 DIAGNOSIS — Z1159 Encounter for screening for other viral diseases: Secondary | ICD-10-CM

## 2020-04-29 LAB — SARS CORONAVIRUS 2 (TAT 6-24 HRS): SARS Coronavirus 2: NEGATIVE

## 2020-04-30 ENCOUNTER — Encounter: Payer: Self-pay | Admitting: Gastroenterology

## 2020-04-30 ENCOUNTER — Ambulatory Visit (AMBULATORY_SURGERY_CENTER): Payer: Medicare Other | Admitting: Gastroenterology

## 2020-04-30 ENCOUNTER — Other Ambulatory Visit: Payer: Self-pay

## 2020-04-30 VITALS — BP 121/70 | HR 55 | Temp 97.1°F | Resp 24 | Ht 61.25 in | Wt 225.0 lb

## 2020-04-30 DIAGNOSIS — Z1211 Encounter for screening for malignant neoplasm of colon: Secondary | ICD-10-CM

## 2020-04-30 MED ORDER — SODIUM CHLORIDE 0.9 % IV SOLN: 500.0000 mL | Freq: Once | INTRAVENOUS | Status: DC

## 2020-04-30 NOTE — Patient Instructions (Signed)
Handouts given for hemorrhoids, diverticulosis and high fiber diet.  Handout given for hemorrhoid banding, please call the office if you are interested in this procedure.  YOU HAD AN ENDOSCOPIC PROCEDURE TODAY AT THE [facility]:   Refer to the procedure report that was given to you for any specific questions about what was found during the examination.  If the procedure report does not answer your questions, please call your gastroenterologist to clarify.  If you requested that your care partner not be given the details of your procedure findings, then the procedure report has been included in a sealed envelope for you to review at your convenience later.  YOU SHOULD EXPECT: Some feelings of bloating in the abdomen. Passage of more gas than usual.  Walking can help get rid of the air that was put into your GI tract during the procedure and reduce the bloating. If you had a lower endoscopy (such as a colonoscopy or flexible sigmoidoscopy) you may notice spotting of blood in your stool or on the toilet paper. If you underwent a bowel prep for your procedure, you may not have a normal bowel movement for a few days.  Please Note:  You might notice some irritation and congestion in your nose or some drainage.  This is from the oxygen used during your procedure.  There is no need for concern and it should clear up in a day or so.  SYMPTOMS TO REPORT IMMEDIATELY:   Following lower endoscopy (colonoscopy or flexible sigmoidoscopy):  Excessive amounts of blood in the stool  Significant tenderness or worsening of abdominal pains  Swelling of the abdomen that is new, acute  Fever of 100F or higher  For urgent or emergent issues, a gastroenterologist can be reached at any hour by calling [phone]. Do not use MyChart messaging for urgent concerns.    DIET:  We do recommend a small meal at first, but then you may proceed to your regular diet.  Drink plenty of fluids but you should avoid  alcoholic beverages for 24 hours.  ACTIVITY:  You should plan to take it easy for the rest of today and you should NOT DRIVE or use heavy machinery until tomorrow (because of the sedation medicines used during the test).    FOLLOW UP: Our staff will call the number listed on your records 48-72 hours following your procedure to check on you and address any questions or concerns that you may have regarding the information given to you following your procedure. If we do not reach you, we will leave a message.  We will attempt to reach you two times.  During this call, we will ask if you have developed any symptoms of COVID 19. If you develop any symptoms (ie: fever, flu-like symptoms, shortness of breath, cough etc.) before then, please call [phone].  If you test positive for Covid 19 in the 2 weeks post procedure, please call and report this information to [place].    If any biopsies were taken you will be contacted by phone or by letter within the next 1-3 weeks.  Please call us at [phone] if you have not heard about the biopsies in 3 weeks.    SIGNATURES/CONFIDENTIALITY: You and/or your care partner have signed paperwork which will be entered into your electronic medical record.  These signatures attest to the fact that that the information above on your After Visit Summary has been reviewed and is understood.  Full responsibility of the confidentiality of this  discharge information lies with you and/or your care-partner.

## 2020-04-30 NOTE — Op Note (Signed)
[facility] Patient Name: Haley [name] Procedure Date: 04/30/2020 9:50 AM MRN: [id number] Endoscopist: Napoleon Form , MD Age: 64 Referring MD:  Date of Birth: [birth year] Gender: Female Account #: [id number] Procedure:                Colonoscopy Indications:              Screening for colorectal malignant neoplasm Medicines:                Monitored Anesthesia Care Procedure:                Pre-Anesthesia Assessment:                           - Prior to the procedure, a History and Physical                            was performed, and patient medications and                            allergies were reviewed. The patient's tolerance of                            previous anesthesia was also reviewed. The risks                            and benefits of the procedure and the sedation                            options and risks were discussed with the patient.                            All questions were answered, and informed consent                            was obtained. Prior Anticoagulants: The patient has                            taken no previous anticoagulant or antiplatelet                            agents. ASA Grade Assessment: III - A patient with                            severe systemic disease. After reviewing the risks                            and benefits, the patient was deemed in                            satisfactory condition to undergo the procedure.                           After obtaining informed consent, the colonoscope  was passed under direct vision. Throughout the                            procedure, the patient's blood pressure, pulse, and                            oxygen saturations were monitored continuously. The                            Colonoscope was introduced through the anus and                            advanced to the the cecum, identified by                            appendiceal  orifice and ileocecal valve. The                            colonoscopy was performed without difficulty. The                            patient tolerated the procedure well. The quality                            of the bowel preparation was adequate. The                            ileocecal valve, appendiceal orifice, and rectum                            were photographed. Scope In: 10:00:56 AM Scope Out: 10:17:16 AM Scope Withdrawal Time: 0 hours 9 minutes 2 seconds  Total Procedure Duration: 0 hours 16 minutes 20 seconds  Findings:                 The perianal and digital rectal examinations were                            normal.                           Multiple small and large-mouthed diverticula were                            found in the sigmoid colon, descending colon,                            transverse colon and ascending colon. There was                            evidence of diverticular spasm. Peri-diverticular                            erythema was seen. There was evidence of an  impacted diverticulum.                           Non-bleeding internal hemorrhoids were found during                            retroflexion. The hemorrhoids were medium-sized.                           The exam was otherwise without abnormality. Complications:            No immediate complications. Estimated Blood Loss:     Estimated blood loss was minimal. Impression:               - Severe diverticulosis in the sigmoid colon, in                            the descending colon, in the transverse colon and                            in the ascending colon. There was evidence of                            diverticular spasm. Peri-diverticular erythema was                            seen. There was evidence of an impacted                            diverticulum.                           - Non-bleeding internal hemorrhoids.                           - The  examination was otherwise normal.                           - No specimens collected. Recommendation:           - Patient has a contact number available for                            emergencies. The signs and symptoms of potential                            delayed complications were discussed with the                            patient. Return to normal activities tomorrow.                            Written discharge instructions were provided to the                            patient.                           -  Resume previous diet.                           - Continue present medications.                           - Repeat colonoscopy in 10 years for screening                            purposes. Napoleon Form, MD 04/30/2020 10:28:23 AM This report has been signed electronically.

## 2020-04-30 NOTE — Progress Notes (Signed)
pt tolerated well. VSS. awake and to recovery. Report given to RN.  

## 2020-04-30 NOTE — Progress Notes (Signed)
Pt. Reports no change in her medical or surgical history since her pre-visit 04/16/2020. 

## 2020-05-03 ENCOUNTER — Telehealth: Payer: Self-pay

## 2020-05-03 NOTE — Telephone Encounter (Signed)
Pt contacted pre-catheterization scheduled at [facility] for: 05/04/2020 at 5:30 am  Verified arrival time and place: [facility] Main Entrance [facility]) at: 5:30  No solid food after midnight prior to cath, clear liquids until 5 AM day of procedure. Pt aware   AM meds can be  taken pre-cath with sips of water including: ASA 81 mg  Pt is to hold Amaryl and Lisinopril Due to GFR=54.  Confirmed patient has responsible adult to drive home post procedure and observe 24 hours after arriving home:  Yes You are allowed ONE visitor in the waiting room during your procedure. Both you and your visitor must wear masks. Pt aware      COVID-19 Pre-Screening Questions:  . In the past 7 to 10 days have you had a new cough, shortness of breath, headache, congestion, fever (100 or greater) unexplained body aches, new sore throat, or sudden loss of taste or sense of smell? No . In the past 7 to 10 days have you been around anyone with known Covid 19? No

## 2020-05-04 ENCOUNTER — Other Ambulatory Visit: Payer: Self-pay

## 2020-05-04 ENCOUNTER — Encounter (HOSPITAL_COMMUNITY)
Admission: RE | Disposition: A | Discharge status: Home / Self Care | Payer: Self-pay | Source: Home / Self Care | Attending: Cardiology

## 2020-05-04 ENCOUNTER — Ambulatory Visit (HOSPITAL_COMMUNITY)
Admission: RE | Admit: 2020-05-04 | Discharge: 2020-05-04 | Disposition: A | Discharge status: Home / Self Care | Payer: Medicare Other | Attending: Cardiology | Admitting: Cardiology

## 2020-05-04 ENCOUNTER — Telehealth: Payer: Self-pay

## 2020-05-04 ENCOUNTER — Telehealth: Payer: Self-pay | Admitting: *Deleted

## 2020-05-04 DIAGNOSIS — I209 Angina pectoris, unspecified: Secondary | ICD-10-CM | POA: Diagnosis present

## 2020-05-04 DIAGNOSIS — Z79899 Other long term (current) drug therapy: Secondary | ICD-10-CM | POA: Insufficient documentation

## 2020-05-04 DIAGNOSIS — R0609 Other forms of dyspnea: Secondary | ICD-10-CM | POA: Diagnosis not present

## 2020-05-04 DIAGNOSIS — Z90710 Acquired absence of both cervix and uterus: Secondary | ICD-10-CM | POA: Insufficient documentation

## 2020-05-04 DIAGNOSIS — E782 Mixed hyperlipidemia: Secondary | ICD-10-CM | POA: Diagnosis not present

## 2020-05-04 DIAGNOSIS — I25119 Atherosclerotic heart disease of native coronary artery with unspecified angina pectoris: Secondary | ICD-10-CM | POA: Insufficient documentation

## 2020-05-04 DIAGNOSIS — Z7982 Long term (current) use of aspirin: Secondary | ICD-10-CM | POA: Insufficient documentation

## 2020-05-04 DIAGNOSIS — E669 Obesity, unspecified: Secondary | ICD-10-CM | POA: Diagnosis not present

## 2020-05-04 DIAGNOSIS — I2510 Atherosclerotic heart disease of native coronary artery without angina pectoris: Secondary | ICD-10-CM

## 2020-05-04 DIAGNOSIS — E119 Type 2 diabetes mellitus without complications: Secondary | ICD-10-CM | POA: Insufficient documentation

## 2020-05-04 DIAGNOSIS — E118 Type 2 diabetes mellitus with unspecified complications: Secondary | ICD-10-CM | POA: Diagnosis present

## 2020-05-04 DIAGNOSIS — Z833 Family history of diabetes mellitus: Secondary | ICD-10-CM | POA: Diagnosis not present

## 2020-05-04 DIAGNOSIS — Z8673 Personal history of transient ischemic attack (TIA), and cerebral infarction without residual deficits: Secondary | ICD-10-CM | POA: Diagnosis not present

## 2020-05-04 DIAGNOSIS — R002 Palpitations: Secondary | ICD-10-CM | POA: Insufficient documentation

## 2020-05-04 DIAGNOSIS — Z6841 Body Mass Index (BMI) 40.0 and over, adult: Secondary | ICD-10-CM | POA: Diagnosis not present

## 2020-05-04 DIAGNOSIS — Z8249 Family history of ischemic heart disease and other diseases of the circulatory system: Secondary | ICD-10-CM | POA: Diagnosis not present

## 2020-05-04 DIAGNOSIS — Z7984 Long term (current) use of oral hypoglycemic drugs: Secondary | ICD-10-CM | POA: Insufficient documentation

## 2020-05-04 DIAGNOSIS — I10 Essential (primary) hypertension: Secondary | ICD-10-CM | POA: Diagnosis not present

## 2020-05-04 DIAGNOSIS — E785 Hyperlipidemia, unspecified: Secondary | ICD-10-CM | POA: Diagnosis present

## 2020-05-04 HISTORY — PX: LEFT HEART CATH AND CORONARY ANGIOGRAPHY: CATH118249

## 2020-05-04 LAB — GLUCOSE, CAPILLARY: Glucose-Capillary: 113 mg/dL — ABNORMAL HIGH (ref 70–99)

## 2020-05-04 SURGERY — LEFT HEART CATH AND CORONARY ANGIOGRAPHY
Anesthesia: LOCAL

## 2020-05-04 MED ORDER — HEPARIN (PORCINE) IN NACL 1000-0.9 UT/500ML-% IV SOLN
INTRAVENOUS | Status: DC | PRN
  Administered 2020-05-04 (×2): 500 mL

## 2020-05-04 MED ORDER — FENTANYL CITRATE (PF) 100 MCG/2ML IJ SOLN
INTRAMUSCULAR | Status: AC
  Filled 2020-05-04: qty 2

## 2020-05-04 MED ORDER — ONDANSETRON HCL 4 MG/2ML IJ SOLN: 4.0000 mg | Freq: Four times a day (QID) | INTRAMUSCULAR | Status: DC | PRN

## 2020-05-04 MED ORDER — HEPARIN SODIUM (PORCINE) 1000 UNIT/ML IJ SOLN
INTRAMUSCULAR | Status: DC | PRN
  Administered 2020-05-04: 5000 [IU] via INTRAVENOUS

## 2020-05-04 MED ORDER — MIDAZOLAM HCL 2 MG/2ML IJ SOLN
INTRAMUSCULAR | Status: AC
  Filled 2020-05-04: qty 2

## 2020-05-04 MED ORDER — SODIUM CHLORIDE 0.9 % WEIGHT BASED INFUSION: 1.0000 mL/kg/h | INTRAVENOUS | Status: DC

## 2020-05-04 MED ORDER — SODIUM CHLORIDE 0.9% FLUSH: 3.0000 mL | Freq: Two times a day (BID) | INTRAVENOUS | Status: DC

## 2020-05-04 MED ORDER — HEPARIN (PORCINE) IN NACL 1000-0.9 UT/500ML-% IV SOLN
INTRAVENOUS | Status: AC
  Filled 2020-05-04: qty 1000

## 2020-05-04 MED ORDER — SODIUM CHLORIDE 0.9% FLUSH: 3.0000 mL | INTRAVENOUS | Status: DC | PRN

## 2020-05-04 MED ORDER — ASPIRIN 81 MG PO CHEW
81.0000 mg | CHEWABLE_TABLET | ORAL | Status: AC
  Administered 2020-05-04: 81 mg via ORAL

## 2020-05-04 MED ORDER — SODIUM CHLORIDE 0.9 % IV SOLN: 250.0000 mL | INTRAVENOUS | Status: DC | PRN

## 2020-05-04 MED ORDER — MIDAZOLAM HCL 2 MG/2ML IJ SOLN
INTRAMUSCULAR | Status: DC | PRN
  Administered 2020-05-04: 1 mg via INTRAVENOUS

## 2020-05-04 MED ORDER — VERAPAMIL HCL 2.5 MG/ML IV SOLN
INTRAVENOUS | Status: DC | PRN
  Administered 2020-05-04: 10 mL via INTRA_ARTERIAL

## 2020-05-04 MED ORDER — LIDOCAINE HCL (PF) 1 % IJ SOLN
INTRAMUSCULAR | Status: DC | PRN
  Administered 2020-05-04: 2 mL

## 2020-05-04 MED ORDER — HEPARIN SODIUM (PORCINE) 1000 UNIT/ML IJ SOLN
INTRAMUSCULAR | Status: AC
  Filled 2020-05-04: qty 1

## 2020-05-04 MED ORDER — IOHEXOL 350 MG/ML SOLN
INTRAVENOUS | Status: DC | PRN
  Administered 2020-05-04: 90 mL

## 2020-05-04 MED ORDER — ACETAMINOPHEN 325 MG PO TABS: 650.0000 mg | ORAL_TABLET | ORAL | Status: DC | PRN

## 2020-05-04 MED ORDER — LIDOCAINE HCL (PF) 1 % IJ SOLN
INTRAMUSCULAR | Status: AC
  Filled 2020-05-04: qty 30

## 2020-05-04 MED ORDER — VERAPAMIL HCL 2.5 MG/ML IV SOLN
INTRAVENOUS | Status: AC
  Filled 2020-05-04: qty 2

## 2020-05-04 MED ORDER — HYDRALAZINE HCL 20 MG/ML IJ SOLN: 10.0000 mg | INTRAMUSCULAR | Status: DC | PRN

## 2020-05-04 MED ORDER — ASPIRIN 81 MG PO CHEW
81.0000 mg | CHEWABLE_TABLET | ORAL | Status: DC
  Filled 2020-05-04: qty 1

## 2020-05-04 MED ORDER — FENTANYL CITRATE (PF) 100 MCG/2ML IJ SOLN
INTRAMUSCULAR | Status: DC | PRN
  Administered 2020-05-04: 25 ug via INTRAVENOUS

## 2020-05-04 MED ORDER — SODIUM CHLORIDE 0.9 % WEIGHT BASED INFUSION
3.0000 mL/kg/h | INTRAVENOUS | Status: AC
  Administered 2020-05-04: 3 mL/kg/h via INTRAVENOUS

## 2020-05-04 SURGICAL SUPPLY — 12 items
CATH 5FR JL3.5 JR4 ANG PIG MP (CATHETERS) ×1 IMPLANT
DEVICE RAD COMP TR BAND LRG (VASCULAR PRODUCTS) ×1 IMPLANT
GLIDESHEATH SLEND SS 6F .021 (SHEATH) ×1 IMPLANT
GUIDEWIRE INQWIRE 1.5J.035X260 (WIRE) IMPLANT
INQWIRE 1.5J .035X260CM (WIRE) ×4
KIT HEART LEFT (KITS) ×2 IMPLANT
PACK CARDIAC CATHETERIZATION (CUSTOM PROCEDURE TRAY) ×2 IMPLANT
SHEATH PROBE COVER 6X72 (BAG) ×1 IMPLANT
SYR MEDRAD MARK 7 150ML (SYRINGE) ×2 IMPLANT
TRANSDUCER W/STOPCOCK (MISCELLANEOUS) ×2 IMPLANT
TUBING CIL FLEX 10 FLL-RA (TUBING) ×2 IMPLANT
WIRE HI TORQ VERSACORE-J 145CM (WIRE) ×1 IMPLANT

## 2020-05-04 NOTE — Telephone Encounter (Signed)
No answer for post procedure callback. Left message and will call back later today. 

## 2020-05-04 NOTE — Discharge Instructions (Signed)
Radial Site Care  This sheet gives you information about how to care for yourself after your procedure. Your health care provider may also give you more specific instructions. If you have problems or questions, contact your health care provider. What can I expect after the procedure? After the procedure, it is common to have:  Bruising and tenderness at the catheter insertion area. Follow these instructions at home: Medicines  Take over-the-counter and prescription medicines only as told by your health care provider. Insertion site care  Follow instructions from your health care provider about how to take care of your insertion site. Make sure you: ? Wash your hands with soap and water before you change your bandage (dressing). If soap and water are not available, use hand sanitizer. ? Change your dressing as told by your health care provider. ? Leave stitches (sutures), skin glue, or adhesive strips in place. These skin closures may need to stay in place for 2 weeks or longer. If adhesive strip edges start to loosen and curl up, you may trim the loose edges. Do not remove adhesive strips completely unless your health care provider tells you to do that.  Check your insertion site every day for signs of infection. Check for: ? Redness, swelling, or pain. ? Fluid or blood. ? Pus or a bad smell. ? Warmth.  Do not take baths, swim, or use a hot tub until your health care provider approves.  You may shower 24-48 hours after the procedure, or as directed by your health care provider. ? Remove the dressing and gently wash the site with plain soap and water. ? Pat the area dry with a clean towel. ? Do not rub the site. That could cause bleeding.  Do not apply powder or lotion to the site. Activity   For 24 hours after the procedure, or as directed by your health care provider: ? Do not flex or bend the affected arm. ? Do not push or pull heavy objects with the affected arm. ? Do not  drive yourself home from the hospital or clinic. You may drive 24 hours after the procedure unless your health care provider tells you not to. ? Do not operate machinery or power tools.  Do not lift anything that is heavier than 10 lb (4.5 kg), or the limit that you are told, until your health care provider says that it is safe.  Ask your health care provider when it is okay to: ? Return to work or school. ? Resume usual physical activities or sports. ? Resume sexual activity. General instructions  If the catheter site starts to bleed, raise your arm and put firm pressure on the site. If the bleeding does not stop, get help right away. This is a medical emergency.  If you went home on the same day as your procedure, a responsible adult should be with you for the first 24 hours after you arrive home.  Keep all follow-up visits as told by your health care provider. This is important. Contact a health care provider if:  You have a fever.  You have redness, swelling, or yellow drainage around your insertion site. Get help right away if:  You have unusual pain at the radial site.  The catheter insertion area swells very fast.  The insertion area is bleeding, and the bleeding does not stop when you hold steady pressure on the area.  Your arm or hand becomes pale, cool, tingly, or numb. These symptoms may represent a serious problem   that is an emergency. Do not wait to see if the symptoms will go away. Get medical help right away. Call your local emergency services (911 in the [place] Do not drive yourself to the hospital. Summary  After the procedure, it is common to have bruising and tenderness at the site.  Follow instructions from your health care provider about how to take care of your radial site wound. Check the wound every day for signs of infection.  Do not lift anything that is heavier than 10 lb (4.5 kg), or the limit that you are told, until your health care provider says  that it is safe. This information is not intended to replace advice given to you by your health care provider. Make sure you discuss any questions you have with your health care provider. Document Revised: 11/28/2017 Document Reviewed: 11/28/2017 Elsevier Patient Education  2020 Elsevier Inc.  

## 2020-05-04 NOTE — Interval H&P Note (Signed)
History and Physical Interval Note:  05/04/2020 7:18 AM  Haley [name]  has presented today for surgery, with the diagnosis of chest pain CAD.  The various methods of treatment have been discussed with the patient and family. After consideration of risks, benefits and other options for treatment, the patient has consented to  Procedure(s): LEFT HEART CATH AND CORONARY ANGIOGRAPHY (N/A) as a surgical intervention.  The patient's history has been reviewed, patient examined, no change in status, stable for surgery.  I have reviewed the patient's chart and labs.  Questions were answered to the patient's satisfaction.   Cath Lab Visit (complete for each Cath Lab visit)  Clinical Evaluation Leading to the Procedure:   ACS: No.  Non-ACS:    Anginal Classification: CCS II  Anti-ischemic medical therapy: Maximal Therapy (2 or more classes of medications)  Non-Invasive Test Results: No non-invasive testing performed  Prior CABG: No previous CABG        Theron Arista [facility] 05/04/2020 7:18 AM

## 2020-05-04 NOTE — Telephone Encounter (Signed)
Second attempt follow up call to pt, lm on vm. 

## 2020-05-05 ENCOUNTER — Encounter (HOSPITAL_COMMUNITY): Payer: Self-pay | Admitting: Cardiology

## 2020-05-12 ENCOUNTER — Ambulatory Visit (HOSPITAL_COMMUNITY): Payer: Medicare Other | Attending: Cardiology

## 2020-05-12 ENCOUNTER — Other Ambulatory Visit: Payer: Self-pay

## 2020-05-12 DIAGNOSIS — I2510 Atherosclerotic heart disease of native coronary artery without angina pectoris: Secondary | ICD-10-CM | POA: Diagnosis present

## 2020-05-12 DIAGNOSIS — I10 Essential (primary) hypertension: Secondary | ICD-10-CM | POA: Insufficient documentation

## 2020-05-12 DIAGNOSIS — E782 Mixed hyperlipidemia: Secondary | ICD-10-CM

## 2020-05-12 DIAGNOSIS — R079 Chest pain, unspecified: Secondary | ICD-10-CM | POA: Diagnosis not present

## 2020-05-12 DIAGNOSIS — R0600 Dyspnea, unspecified: Secondary | ICD-10-CM | POA: Insufficient documentation

## 2020-05-12 DIAGNOSIS — R0609 Other forms of dyspnea: Secondary | ICD-10-CM

## 2020-05-12 MED ORDER — PERFLUTREN LIPID MICROSPHERE
1.0000 mL | INTRAVENOUS | Status: AC | PRN
  Administered 2020-05-12: 3 mL via INTRAVENOUS

## 2020-05-13 ENCOUNTER — Telehealth: Payer: Self-pay | Admitting: *Deleted

## 2020-05-13 MED ORDER — FUROSEMIDE 20 MG PO TABS
20.0000 mg | ORAL_TABLET | Freq: Every day | ORAL | 0 refills | Status: DC
Start: 2020-05-13 — End: 2020-06-04

## 2020-05-13 NOTE — Telephone Encounter (Signed)
RE: [facility] 05/04/20-no follow up scheduled Received: Today Haley Masson, MD  Haley Krauss, RN Cc: Loa Socks, LPN Please add lasix 20 mg po daily to her regimen and schedule her with me or a PA/NP in 2 months.  Above copied from 05/13/20 staff message.  Spoke with patient and discussed Dr Lindaann Slough recommendation to add lasix, discussed eating foods high in potassium, and follow up appt scheduled with Jacolyn Reedy, PA 07/13/20 1:15 PM. Pt states she does have leg cramps, mostly at night, has had them for a while.  Pt advised to call office if these seem to get worse after starting lasix.   Pt verbalized understanding.

## 2020-06-04 ENCOUNTER — Telehealth: Payer: Self-pay | Admitting: Cardiology

## 2020-06-04 NOTE — Telephone Encounter (Signed)
Spoke with the patient who states that she was prescribed Lasix 20 mg daily. She states that she has not been taking it because she is not sure why it was prescribed. Phone note from 07/08 shows a message from Dr. Delton See to start the patient on Lasix 20 mg daily. I was unable to find reason for starting Lasix in patient's notes. Patient states that she is not having any swelling or sob. She states her BP has been good and on the lower side. She states that she is already dehydrated and does not drink a lot of water so she does not want to take a diuretic.

## 2020-06-04 NOTE — Telephone Encounter (Signed)
Spoke with the patient and advised her that per Dr. Delton See she does not have to take the Lasix. Patient verbalized understanding and will not be taking it.

## 2020-06-04 NOTE — Telephone Encounter (Signed)
She doesn't have to take it.

## 2020-06-04 NOTE — Telephone Encounter (Signed)
Pt called and wanted to ask Dr. Delton See or nurse some questions that are important. Please call back

## 2020-06-09 ENCOUNTER — Other Ambulatory Visit: Payer: Self-pay | Admitting: Internal Medicine

## 2020-06-25 ENCOUNTER — Other Ambulatory Visit: Payer: Self-pay

## 2020-06-25 MED ORDER — CETIRIZINE HCL 10 MG PO TABS: 10.0000 mg | ORAL_TABLET | Freq: Every day | ORAL | 3 refills | Status: DC

## 2020-06-25 MED ORDER — MOMETASONE FUROATE 50 MCG/ACT NA SUSP: NASAL | 11 refills | Status: DC

## 2020-07-08 NOTE — Progress Notes (Signed)
Cardiology Office Note    Date:  07/13/2020   ID:  Haley [name], DOB [birth year], MRN [id number]  PCP:  Julieanne Manson, MD  Cardiologist: Tobias Alexander, MD EPS: None  Chief Complaint  Patient presents with  . Follow-up    History of Present Illness:  Haley [name] is a 64 y.o. female  with history of coronary CTA in 2017 with calcium score of 239 which was 93 percentile, moderate diffuse CAD possible obstructive.  Cardiac catheterization 10/2016 mild nonobstructive disease in the LAD, moderate stenosis in the mid to distal PDA 50% normal LV function medical therapy recommended.  Has been on Praluent managed by lipid clinic.  Patient last saw Dr. Delton See 04/23/20 complaining of exertional chest pain and dyspnea on exertion. Repeat cath 05/04/20 nonobstructive CAD similar to cath 2017, mildly elevated LVEDP started on Lasix but since stopped.echo also normal.   Patient comes in for hospital f/u. Still complains of shortness of breath and chest pressure into left arm if she goes up stairs or climbs a hill, or dance. No problem if she walks on flat ground. Never took the lasix because she was told she was dehydrated before the cath and no swelling. Doesn't drink hardly any water at all.  Past Medical History:  Diagnosis Date  . Allergy   . Back pain   . Depression   . DM type 2, controlled, with complication (HCC) 07/22/2013   Type II DM diagnosed in ~ 2006 per pt hx.   . Ganglion cyst of wrist, right 05/09/2017  . Hyperlipidemia   . Hypertension   . Itching   . Shingles   . Stroke [facility]) 2000   per pt. report    Past Surgical History:  Procedure Laterality Date  . ABDOMINAL HYSTERECTOMY  1994   Laparoscopic; ovaries still intact.  Performed for prolapsed uterus.  Marland Kitchen CARDIAC CATHETERIZATION N/A 10/23/2016   Procedure: Left Heart Cath and Coronary Angiography;  Surgeon: Kathleene Hazel, MD;  Location: [facility] INVASIVE CV LAB;  Service: Cardiovascular;  Laterality: N/A;    . CHOLECYSTECTOMY  1997   Laparoscopic  . COLONOSCOPY  2005-at age 86   in [place]-polyps removed per pt   . LEFT HEART CATH AND CORONARY ANGIOGRAPHY N/A 05/04/2020   Procedure: LEFT HEART CATH AND CORONARY ANGIOGRAPHY;  Surgeon: Swaziland, Peter M, MD;  Location[facility] INVASIVE CV LAB;  Service: Cardiovascular;  Laterality: N/A;  . POLYPECTOMY    . WISDOM TOOTH EXTRACTION      Current Medications: Current Meds  Medication Sig  . amLODipine (NORVASC) 5 MG tablet Take 1 tablet (5 mg total) by mouth daily.  Marland Kitchen aspirin 81 MG tablet Take 81 mg by mouth daily.  . cetirizine (ZYRTEC) 10 MG tablet Take 1 tablet (10 mg total) by mouth daily.  . Cholecalciferol (VITAMIN D) 2000 units CAPS Take 2,000 Units by mouth daily. 1 daily   . GARLIC PO Take 2 capsules by mouth daily. 2 daily   . glimepiride (AMARYL) 2 MG tablet TAKE 1 & 1/2 (ONE & ONE-HALF) TABLETS BY MOUTH ONCE DAILY WITH BREAKFAST  . lisinopril (ZESTRIL) 40 MG tablet Take 1 tablet by mouth once daily  . meloxicam (MOBIC) 7.5 MG tablet Take 7.5 mg by mouth daily as needed for pain.   . metoprolol tartrate (LOPRESSOR) 25 MG tablet Take 1 tablet (25 mg total) by mouth 2 (two) times daily.  . Misc Natural Products (TART CHERRY ADVANCED PO) Take 5 mLs by mouth daily.   Marland Kitchen  mometasone (NASONEX) 50 MCG/ACT nasal spray USE 2 SPRAY(S) IN EACH NOSTRIL ONCE DAILY  . Multiple Vitamins-Minerals (MULTIVITAMIN WITH MINERALS) tablet Take 1 tablet by mouth daily.  . nitroGLYCERIN (NITROSTAT) 0.4 MG SL tablet DISSOLVE ONE TABLET UNDER THE TONGUE EVERY 5 MINUTES AS NEEDED FOR CHEST PAIN.  DO NOT EXCEED A TOTAL OF 3 DOSES IN 15 MINUTES  . omega-3 acid ethyl esters (LOVAZA) 1 g capsule Take 1 g by mouth 2 (two) times daily.  . rosuvastatin (CRESTOR) 10 MG tablet Take 1 tablet (10 mg total) by mouth daily.  . vitamin C (ASCORBIC ACID) 500 MG tablet Take 500 mg by mouth daily.  Marland Kitchen. zinc gluconate 50 MG tablet Take 50 mg by mouth daily.     Allergies:   Praluent  [alirocumab], Zetia [ezetimibe], Atorvastatin, and Pravastatin   Social History   Socioeconomic History  . Marital status: Single    Spouse name: Not on file  . Number of children: 2  . Years of education: 12+  . Highest education level: Some college, no degree  Occupational History  . Occupation: Print production plannerLeasing Agent    Comment: Retired now  Tobacco Use  . Smoking status: Never Smoker  . Smokeless tobacco: Never Used  Vaping Use  . Vaping Use: Never used  Substance and Sexual Activity  . Alcohol use: Yes    Alcohol/week: 0.0 standard drinks    Comment: occasionally-1 to 2 drinks monthly   . Drug use: No  . Sexual activity: Never  Other Topics Concern  . Not on file  Social History Narrative   Did get some post high school training/education   Lives alone   Daughter lives in [place] and checks in regularly.   Social Determinants of Health   Financial Resource Strain:   . Difficulty of Paying Living Expenses: Not on file  Food Insecurity:   . Worried About Programme researcher, broadcasting/film/videounning Out of Food in the Last Year: Not on file  . Ran Out of Food in the Last Year: Not on file  Transportation Needs:   . Lack of Transportation (Medical): Not on file  . Lack of Transportation (Non-Medical): Not on file  Physical Activity:   . Days of Exercise per Week: Not on file  . Minutes of Exercise per Session: Not on file  Stress:   . Feeling of Stress : Not on file  Social Connections:   . Frequency of Communication with Friends and Family: Not on file  . Frequency of Social Gatherings with Friends and Family: Not on file  . Attends Religious Services: Not on file  . Active Member of Clubs or Organizations: Not on file  . Attends BankerClub or Organization Meetings: Not on file  . Marital Status: Not on file     Family History:  The patient's   family history includes Cancer (age of onset: 4840) in her sister; Diabetes in her brother, father, sister, and son; Hypertension in her brother and son; Seizures in  her brother; Stroke in her maternal aunt.   ROS:   Please see the history of present illness.    ROS All other systems reviewed and are negative.   PHYSICAL EXAM:   VS:  BP 132/78   Pulse 77   Ht 5\' 1"  (1.549 m)   Wt 225 lb (102.1 kg)   SpO2 94%   BMI 42.51 kg/m   Physical Exam  GEN: Obese, in no acute distress  Neck: no JVD, carotid bruits, or masses Cardiac:RRR;S4 Respiratory:  clear to  auscultation bilaterally, normal work of breathing GI: soft, nontender, nondistended, + BS Ext: without cyanosis, clubbing, or edema, Good distal pulses bilaterally Neuro:  Alert and Oriented x 3 Psych: euthymic mood, full affect  Wt Readings from Last 3 Encounters:  07/13/20 225 lb (102.1 kg)  05/04/20 225 lb (102.1 kg)  04/30/20 225 lb (102.1 kg)      Studies/Labs Reviewed:   EKG:  EKG is not ordered today.  Recent Labs: 03/03/2020: ALT 20; TSH 3.250 04/22/2020: BUN 13; Creatinine, Ser 1.20; Hemoglobin 14.5; Platelets 280; Potassium 3.9; Sodium 140   Lipid Panel    Component Value Date/Time   CHOL 120 03/03/2020 1144   TRIG 93 03/03/2020 1144   HDL 52 03/03/2020 1144   LDLCALC 50 03/03/2020 1144   LDLDIRECT 134 (H) 07/22/2013 1620    Additional studies/ records that were reviewed today include:  2Decho 05/12/20 IMPRESSIONS     1. Left ventricular ejection fraction, by estimation, is 60 to 65%. The  left ventricle has normal function. The left ventricle has no regional  wall motion abnormalities. There is mild concentric left ventricular  hypertrophy. Left ventricular diastolic  parameters are indeterminate.   2. Right ventricular systolic function is normal. The right ventricular  size is normal. Tricuspid regurgitation signal is inadequate for assessing  PA pressure.   3. The mitral valve is normal in structure. Trivial mitral valve  regurgitation. No evidence of mitral stenosis.   4. The aortic valve is tricuspid. Aortic valve regurgitation is not  visualized. No  aortic stenosis is present.   5. The inferior vena cava is normal in size with greater than 50%  respiratory variability, suggesting right atrial pressure of 3 mmHg.   Comparison(s): No significant change from prior study.   Conclusion(s)/Recommendation(s): Normal biventricular function without  evidence of hemodynamically significant valvular heart disease.  Cardiac Cath 05/04/20  Ost LAD to Prox LAD lesion is 30% stenosed.  RPDA lesion is 50% stenosed.  The left ventricular systolic function is normal.  LV end diastolic pressure is mildly elevated.  The left ventricular ejection fraction is 55-65% by visual estimate.   1. Nonobstructive CAD. Unchanged from 2017. 2. Normal LV function 3. Mildly elevated LVEDP 20 mm Hg   Plan: medical management.      Coronary CTA IMPRESSION: 1. Coronary calcium score of 239. This was 20 percentile for age and sex matched control.   2. Normal coronary origin with right dominance.   3. 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 We will send additional analysis for CT FFT evaluation once available.   4. Mildly dilated pulmonary artery measuring 31 x 27 mm suspicious for pulmonary hypertension.   Tobias Alexander     Electronically Signed   By: Tobias Alexander   On: 08/15/2016 21:18       ASSESSMENT:    1. Coronary artery disease involving native coronary artery of native heart without angina pectoris   2. Essential hypertension   3. Hyperlipidemia, unspecified hyperlipidemia type   4. Palpitations   5. Class 3 severe obesity due to excess calories with serious comorbidity and body mass index (BMI) of 40.0 to 44.9 in adult (HCC)   6. Diabetes mellitus without complication (HCC)      PLAN:  In order of problems listed above: CAD status post cath showed nonobstructive CAD 05/04/20 no change from 2017. medical therapy recommended. Echo also normal.  Patient continues to have exertional chest pain and her left arm and dyspnea  on exertion when going  up stairs an incline or dancing.  No problems on level ground.  We will try low-dose Imdur 15 mg once daily.  She will call us if this is not helping.  Essential hypertension blood pressure controlled  Hyperlipidemia LDL 52 02/2020  Palpitations controlled on metoprolol  Obesity BMI 42 weight loss essential for overall health  DM type 2 treated by PCP      Medication Adjustments/Labs and Tests Ordered: Current medicines are reviewed at length with the patient today.  Concerns regarding medicines are outlined above.  Medication changes, Labs and Tests ordered today are listed in the Patient Instructions below. Patient Instructions  Medication Instructions:  Your physician has recommended you make the following change in your medication:   START: isosorbide mononitrate (imdur) 30 mg tablet: Take 1/2 tablet (15 mg) once a day  *If you need a refill on your cardiac medications before your next appointment, please call your pharmacy*   Lab Work: None  If you have labs (blood work) drawn today and your tests are completely normal, you will receive your results only by: Marland Kitchen MyChart Message (if you have MyChart) OR . A paper copy in the mail If you have any lab test that is abnormal or we need to change your treatment, we will call you to review the results.   Testing/Procedures: None    Follow-Up: At [facility], you and your health needs are our priority.  As part of our continuing mission to provide you with exceptional heart care, we have created designated Provider Care Teams.  These Care Teams include your primary Cardiologist (physician) and Advanced Practice Providers (APPs -  Physician Assistants and Nurse Practitioners) who all work together to provide you with the care you need, when you need it.  We recommend signing up for the patient portal called "MyChart".  Sign up information is provided on this After Visit Summary.  MyChart is used to  connect with patients for Virtual Visits (Telemedicine).  Patients are able to view lab/test results, encounter notes, upcoming appointments, etc.  Non-urgent messages can be sent to your provider as well.   To learn more about what you can do with MyChart, go to [email or web address].    Your next appointment:   2-3 month(s)  The format for your next appointment:   In Person  Provider:   Tobias Alexander, MD or Jacolyn Reedy, PA-C   Other Instructions None      Signed, Jacolyn Reedy, PA-C  07/13/2020 2:00 PM    [facility] [address] [place], [place], [place]  [address] Phone: [phone]; Fax: [phone]

## 2020-07-13 ENCOUNTER — Other Ambulatory Visit: Payer: Self-pay

## 2020-07-13 ENCOUNTER — Encounter: Payer: Self-pay | Admitting: Physician Assistant

## 2020-07-13 ENCOUNTER — Ambulatory Visit (INDEPENDENT_AMBULATORY_CARE_PROVIDER_SITE_OTHER): Payer: Medicare Other | Admitting: Physician Assistant

## 2020-07-13 VITALS — BP 132/78 | HR 77 | Ht 61.0 in | Wt 225.0 lb

## 2020-07-13 DIAGNOSIS — Z6841 Body Mass Index (BMI) 40.0 and over, adult: Secondary | ICD-10-CM

## 2020-07-13 DIAGNOSIS — I2510 Atherosclerotic heart disease of native coronary artery without angina pectoris: Secondary | ICD-10-CM

## 2020-07-13 DIAGNOSIS — E785 Hyperlipidemia, unspecified: Secondary | ICD-10-CM | POA: Diagnosis not present

## 2020-07-13 DIAGNOSIS — R002 Palpitations: Secondary | ICD-10-CM | POA: Diagnosis not present

## 2020-07-13 DIAGNOSIS — E119 Type 2 diabetes mellitus without complications: Secondary | ICD-10-CM

## 2020-07-13 DIAGNOSIS — I10 Essential (primary) hypertension: Secondary | ICD-10-CM | POA: Diagnosis not present

## 2020-07-13 MED ORDER — ISOSORBIDE MONONITRATE ER 30 MG PO TB24
15.0000 mg | ORAL_TABLET | Freq: Every day | ORAL | 3 refills | Status: DC
Start: 2020-07-13 — End: 2021-05-06

## 2020-07-13 NOTE — Patient Instructions (Signed)
Medication Instructions:  Your physician has recommended you make the following change in your medication:   START: isosorbide mononitrate (imdur) 30 mg tablet: Take 1/2 tablet (15 mg) once a day  *If you need a refill on your cardiac medications before your next appointment, please call your pharmacy*   Lab Work: None  If you have labs (blood work) drawn today and your tests are completely normal, you will receive your results only by: Marland Kitchen MyChart Message (if you have MyChart) OR . A paper copy in the mail If you have any lab test that is abnormal or we need to change your treatment, we will call you to review the results.   Testing/Procedures: None    Follow-Up: At [facility], you and your health needs are our priority.  As part of our continuing mission to provide you with exceptional heart care, we have created designated Provider Care Teams.  These Care Teams include your primary Cardiologist (physician) and Advanced Practice Providers (APPs -  Physician Assistants and Nurse Practitioners) who all work together to provide you with the care you need, when you need it.  We recommend signing up for the patient portal called "MyChart".  Sign up information is provided on this After Visit Summary.  MyChart is used to connect with patients for Virtual Visits (Telemedicine).  Patients are able to view lab/test results, encounter notes, upcoming appointments, etc.  Non-urgent messages can be sent to your provider as well.   To learn more about what you can do with MyChart, go to [email or web address].    Your next appointment:   2-3 month(s)  The format for your next appointment:   In Person  Provider:   Tobias Alexander, MD or Jacolyn Reedy, PA-C   Other Instructions None

## 2020-07-15 ENCOUNTER — Telehealth: Payer: Self-pay | Admitting: Cardiology

## 2020-07-15 NOTE — Telephone Encounter (Signed)
Pt would like for Ivy to give her a call.

## 2020-07-15 NOTE — Telephone Encounter (Signed)
Spoke with the pt.  She was just following up to endorse her second covid vaccine is scheduled for 9/15.  Pt states she will call back as needed, if she has any issues.  Pt gracious for all the assistance provided.

## 2020-08-19 ENCOUNTER — Telehealth: Payer: Self-pay | Admitting: Internal Medicine

## 2020-08-19 NOTE — Telephone Encounter (Signed)
Pt. Will like to know if we have shingles vaccination available  here at the clinic. Pt. Stated will like to get one.

## 2020-08-20 NOTE — Telephone Encounter (Signed)
Pt. called back  today inquiring on shingles vaccine information requested yesterday @ 10:57 am

## 2020-08-20 NOTE — Telephone Encounter (Signed)
We do not have it in office Per Dr. Delrae Alfred- pt. can get it anywhere with her insurance. To advise patient to call her insurance company and check where its covered

## 2020-08-20 NOTE — Telephone Encounter (Signed)
Pt. Informed.

## 2020-08-26 ENCOUNTER — Other Ambulatory Visit: Payer: Self-pay | Admitting: Internal Medicine

## 2020-08-26 MED ORDER — ROSUVASTATIN CALCIUM 10 MG PO TABS: 10.0000 mg | ORAL_TABLET | Freq: Every day | ORAL | 3 refills | Status: DC

## 2020-08-26 NOTE — Telephone Encounter (Signed)
Called and notified Rx sent to [facility]

## 2020-09-03 ENCOUNTER — Other Ambulatory Visit: Payer: Self-pay

## 2020-09-03 ENCOUNTER — Encounter: Payer: Self-pay | Admitting: Internal Medicine

## 2020-09-03 ENCOUNTER — Ambulatory Visit (INDEPENDENT_AMBULATORY_CARE_PROVIDER_SITE_OTHER): Payer: Medicare Other | Admitting: Internal Medicine

## 2020-09-03 VITALS — BP 128/84 | HR 73 | Resp 12 | Ht 61.5 in | Wt 229.0 lb

## 2020-09-03 DIAGNOSIS — E119 Type 2 diabetes mellitus without complications: Secondary | ICD-10-CM | POA: Diagnosis not present

## 2020-09-03 DIAGNOSIS — E7800 Pure hypercholesterolemia, unspecified: Secondary | ICD-10-CM

## 2020-09-03 DIAGNOSIS — M722 Plantar fascial fibromatosis: Secondary | ICD-10-CM

## 2020-09-03 NOTE — Patient Instructions (Addendum)
LL Bean Wicked Good Slippers for women--clogs--cost about $80.    Frog leg on bed to stretch achilles and plantar foot or curb stretching Frozen bottle roll with barefoot. Heel cups/supports for both feet.

## 2020-09-03 NOTE — Progress Notes (Signed)
Subjective:    Patient ID: Haley [name], female   DOB: October 02, 1956, 64 y.o.   MRN: [id number]   HPI   1.  Right foot--pain on plantar aspect of arch.  Noted about 1 week ago.  Not physically active.  Hurts only when bears weight.  No history of injury.  Barefoot on hard floors at home.  She does have diabetes.  She did wear some really thin soled sneakers recently.    2.  DM:  Checking sugars and generally in low 100s--below 130.  She has been snacking more at night before bed.  Has a lot of unhealthy snacks as she keeps her great grandchildren frequently.  3.  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  She was placed on Imdur by Jacolyn Reedy, PA-C.  She forgot to take the Imdur after a couple of weeks and noted much depleted energy.  Went back to taking daily and feels much better.   Has a follow up with her 11/10.  4.  Hyperlipidemia:  Cholesterol was fine in April.  Again, not eating well and not physically active as in past.  Stays in a lot with the pandemic.  May be a little depressed.    Current Meds  Medication Sig   amLODipine (NORVASC) 5 MG tablet Take 1 tablet (5 mg total) by mouth daily.   aspirin 81 MG tablet Take 81 mg by mouth daily.   cetirizine (ZYRTEC) 10 MG tablet Take 1 tablet (10 mg total) by mouth daily.   Cholecalciferol (VITAMIN D) 2000 units CAPS Take 2,000 Units by mouth daily. 1 daily    GARLIC PO Take 2 capsules by mouth daily. 2 daily    glimepiride (AMARYL) 2 MG tablet TAKE 1 & 1/2 (ONE & ONE-HALF) TABLETS BY MOUTH ONCE DAILY WITH BREAKFAST   isosorbide mononitrate (IMDUR) 30 MG 24 hr tablet Take 0.5 tablets (15 mg total) by mouth daily.   lisinopril (ZESTRIL) 40 MG tablet Take 1 tablet by mouth once daily   meloxicam (MOBIC) 7.5 MG tablet Take 7.5 mg by mouth daily as needed for pain.    metoprolol tartrate (LOPRESSOR) 25 MG tablet Take 1 tablet (25 mg total) by mouth 2 (two) times daily.   Misc Natural Products (TART CHERRY ADVANCED PO) Take 5 mLs  by mouth daily.    mometasone (NASONEX) 50 MCG/ACT nasal spray USE 2 SPRAY(S) IN EACH NOSTRIL ONCE DAILY   Multiple Vitamins-Minerals (MULTIVITAMIN WITH MINERALS) tablet Take 1 tablet by mouth daily.   omega-3 acid ethyl esters (LOVAZA) 1 g capsule Take 1 g by mouth 2 (two) times daily.   rosuvastatin (CRESTOR) 10 MG tablet Take 1 tablet (10 mg total) by mouth daily.   vitamin C (ASCORBIC ACID) 500 MG tablet Take 500 mg by mouth daily.   zinc gluconate 50 MG tablet Take 50 mg by mouth daily.   Allergies  Allergen Reactions   Praluent [Alirocumab]     Had significant fatigue and memory loss--was lost driving in town.  Joint pain and nausea as well.   Zetia [Ezetimibe] Other (See Comments)    Joint pain and ear ringing   Atorvastatin Other (See Comments)    Pt states causes bilateral lower extremity muscle cramps   Pravastatin Nausea And Vomiting and Other (See Comments)    Causes dizziness     Review of Systems    Objective:   BP 128/84 (BP Location: Left Arm, Patient Position: Sitting, Cuff Size: Large)   Pulse 73  Resp 12   Ht 5' 1.5" (1.562 m)   Wt 229 lb (103.9 kg)   BMI 42.57 kg/m   Physical Exam NAD HEENT:  PERRL, EOMI, TMs pearly gray Neck:  Supple, No adenopathy Chest:  CTA CV:  RRR with normal S1 and S2, No S3, S4 or murmur.  No carotid bruits.  Carotid, radial and DP pulses normal and equal. LE:  No edema.  Right plantar heel and arch with tenderness to palpation.   Assessment & Plan    DM:  A1C and eye referral.  To continue to look for ways to be physically active and eat in a healthy way during pandemic.    2.  Right plantar fasciitis:  heel cups.  No bare feet on hard floors.  Stretches given  3.  Hypercholesterolemia:  FLP  4.  HM:  pneumococcal 23 next month after the 13th.

## 2020-09-04 LAB — HGB A1C W/O EAG: Hgb A1c MFr Bld: 6.1 % — ABNORMAL HIGH (ref 4.8–5.6)

## 2020-09-08 NOTE — Progress Notes (Signed)
Cardiology Office Note    Date:  09/15/2020   ID:  Haley [name], DOB 1956-05-02, MRN [id number]  PCP:  Julieanne MansonMulberry, Elizabeth, MD  Cardiologist: Tobias AlexanderKatarina Ports, MD EPS: None  No chief complaint on file.   History of Present Illness:  Haley [name] is a 64 y.o. female with history of coronary CTA in 2017 with calcium score of 239 which was 93 percentile, moderate diffuse CAD possible obstructive.  Cardiac catheterization 10/2016 mild nonobstructive disease in the LAD, moderate stenosis in the mid to distal PDA 50% normal LV function medical therapy recommended.  Has been on Praluent managed by lipid clinic.   Patient last saw Dr. Delton SeeNelson 04/23/20 complaining of exertional chest pain and dyspnea on exertion. Repeat cath 05/04/20 nonobstructive CAD similar to cath 2017, mildly elevated LVEDP started on Lasix but since stopped.echo also normal.  I saw patient 07/13/2020 and she was still complaining of shortness of breath and chest pressure and her left arm if she goes up stairs or climbs a hill or dances.  No problem if she was walking on flat ground.  She never took Lasix because she was told she was dehydrated before the cath and had no swelling.  She rarely would drink water.  I added low-dose Imdur to see if this would help.  Patient comes in for f/u. Imdur has made a dramatic improvement. Able to dance without symptoms. Walking about 1 mile 2-3 days/week. BP up a little here today but has been controlled at home ~120-130/70-80. Hasn't had most of her meds yet. No palpitations unless she has caffeine. Denies chest pain, dyspnea or cardiac complaints       Past Medical History:  Diagnosis Date  . Allergy   . Back pain   . Depression   . DM type 2, controlled, with complication (HCC) 07/22/2013   Type II DM diagnosed in ~ 2006 per pt hx.   . Ganglion cyst of wrist, right 05/09/2017  . Hyperlipidemia   . Hypertension   . Itching   . Shingles   . Stroke [facility](HCC) 2000   per pt. report     Past Surgical History:  Procedure Laterality Date  . ABDOMINAL HYSTERECTOMY  1994   Laparoscopic; ovaries still intact.  Performed for prolapsed uterus.  Marland Kitchen. CARDIAC CATHETERIZATION N/A 10/23/2016   Procedure: Left Heart Cath and Coronary Angiography;  Surgeon: Kathleene Hazelhristopher D McAlhany, MD;  [facility];  Service: Cardiovascular;  Laterality: N/A;  . CHOLECYSTECTOMY  1997   Laparoscopic  . COLONOSCOPY  2005-at age 64   in [place]-polyps removed per pt   . LEFT HEART CATH AND CORONARY ANGIOGRAPHY N/A 05/04/2020   Procedure: LEFT HEART CATH AND CORONARY ANGIOGRAPHY;  Surgeon: SwazilandJordan, Peter M, MD;  [facility];  Service: Cardiovascular;  Laterality: N/A;  . POLYPECTOMY    . WISDOM TOOTH EXTRACTION      Current Medications: Current Meds  Medication Sig  . amLODipine (NORVASC) 5 MG tablet Take 1 tablet (5 mg total) by mouth daily.  Marland Kitchen. aspirin 81 MG tablet Take 81 mg by mouth daily.  . cetirizine (ZYRTEC) 10 MG tablet Take 1 tablet (10 mg total) by mouth daily.  . Cholecalciferol (VITAMIN D) 2000 units CAPS Take 2,000 Units by mouth daily. 1 daily   . GARLIC PO Take 2 capsules by mouth daily. 2 daily   . glimepiride (AMARYL) 2 MG tablet TAKE 1 & 1/2 (ONE & ONE-HALF) TABLETS BY MOUTH ONCE DAILY  WITH BREAKFAST  . isosorbide mononitrate (IMDUR) 30 MG 24 hr tablet Take 0.5 tablets (15 mg total) by mouth daily.  Marland Kitchen lisinopril (ZESTRIL) 40 MG tablet Take 1 tablet by mouth once daily  . meloxicam (MOBIC) 7.5 MG tablet Take 7.5 mg by mouth daily as needed for pain.   . metoprolol tartrate (LOPRESSOR) 25 MG tablet Take 1 tablet (25 mg total) by mouth 2 (two) times daily.  . Misc Natural Products (TART CHERRY ADVANCED PO) Take 5 mLs by mouth daily.   . mometasone (NASONEX) 50 MCG/ACT nasal spray USE 2 SPRAY(S) IN EACH NOSTRIL ONCE DAILY  . Multiple Vitamins-Minerals (MULTIVITAMIN WITH MINERALS) tablet Take 1 tablet by mouth daily.  . nitroGLYCERIN (NITROSTAT) 0.4  MG SL tablet DISSOLVE ONE TABLET UNDER THE TONGUE EVERY 5 MINUTES AS NEEDED FOR CHEST PAIN.  DO NOT EXCEED A TOTAL OF 3 DOSES IN 15 MINUTES  . omega-3 acid ethyl esters (LOVAZA) 1 g capsule Take 1 g by mouth 2 (two) times daily.  . rosuvastatin (CRESTOR) 10 MG tablet Take 1 tablet (10 mg total) by mouth daily.  . vitamin C (ASCORBIC ACID) 500 MG tablet Take 500 mg by mouth daily.  Marland Kitchen zinc gluconate 50 MG tablet Take 50 mg by mouth daily.     Allergies:   Praluent [alirocumab], Zetia [ezetimibe], Atorvastatin, and Pravastatin   Social History   Socioeconomic History  . Marital status: Single    Spouse name: Not on file  . Number of children: 2  . Years of education: 12+  . Highest education level: Some college, no degree  Occupational History  . Occupation: Print production planner    Comment: Retired now  Tobacco Use  . Smoking status: Never Smoker  . Smokeless tobacco: Never Used  Vaping Use  . Vaping Use: Never used  Substance and Sexual Activity  . Alcohol use: Yes    Alcohol/week: 0.0 standard drinks    Comment: occasionally-1 to 2 drinks monthly   . Drug use: No  . Sexual activity: Never  Other Topics Concern  . Not on file  Social History Narrative   Did get some post high school training/education   Lives alone   Daughter lives in [place] and checks in regularly.   Social Determinants of Health   Financial Resource Strain:   . Difficulty of Paying Living Expenses: Not on file  Food Insecurity:   . Worried About Programme researcher, broadcasting/film/video in the Last Year: Not on file  . Ran Out of Food in the Last Year: Not on file  Transportation Needs:   . Lack of Transportation (Medical): Not on file  . Lack of Transportation (Non-Medical): Not on file  Physical Activity:   . Days of Exercise per Week: Not on file  . Minutes of Exercise per Session: Not on file  Stress:   . Feeling of Stress : Not on file  Social Connections:   . Frequency of Communication with Friends and Family: Not  on file  . Frequency of Social Gatherings with Friends and Family: Not on file  . Attends Religious Services: Not on file  . Active Member of Clubs or Organizations: Not on file  . Attends Banker Meetings: Not on file  . Marital Status: Not on file     Family History:  The patient's family history includes Cancer (age of onset: 46) in her sister; Diabetes in her brother, father, sister, and son; Hypertension in her brother and son; Seizures in her brother;  Stroke in her maternal aunt.   ROS:   Please see the history of present illness.    ROS All other systems reviewed and are negative.   PHYSICAL EXAM:   VS:  BP (!) 154/80   Pulse 73   Ht 5' 1.5" (1.562 m)   Wt 230 lb 6.4 oz (104.5 kg)   SpO2 96%   BMI 42.83 kg/m   Physical Exam  GEN: Obese, in no acute distress  Neck: no JVD, carotid bruits, or masses Cardiac:RRR; no murmurs, rubs, or gallops  Respiratory:  clear to auscultation bilaterally, normal work of breathing GI: soft, nontender, nondistended, + BS Ext: without cyanosis, clubbing, or edema, Good distal pulses bilaterally Neuro:  Alert and Oriented x 3 Psych: euthymic mood, full affect  Wt Readings from Last 3 Encounters:  09/15/20 230 lb 6.4 oz (104.5 kg)  09/03/20 229 lb (103.9 kg)  07/13/20 225 lb (102.1 kg)      Studies/Labs Reviewed:   EKG:  EKG is  ordered today.   Recent Labs: 03/03/2020: ALT 20; TSH 3.250 04/22/2020: BUN 13; Creatinine, Ser 1.20; Hemoglobin 14.5; Platelets 280; Potassium 3.9; Sodium 140   Lipid Panel    Component Value Date/Time   CHOL 120 03/03/2020 1144   TRIG 93 03/03/2020 1144   HDL 52 03/03/2020 1144   LDLCALC 50 03/03/2020 1144   LDLDIRECT 134 (H) 07/22/2013 1620    Additional studies/ records that were reviewed today include:    2Decho 05/12/20 IMPRESSIONS     1. Left ventricular ejection fraction, by estimation, is 60 to 65%. The  left ventricle has normal function. The left ventricle has no regional   wall motion abnormalities. There is mild concentric left ventricular  hypertrophy. Left ventricular diastolic  parameters are indeterminate.   2. Right ventricular systolic function is normal. The right ventricular  size is normal. Tricuspid regurgitation signal is inadequate for assessing  PA pressure.   3. The mitral valve is normal in structure. Trivial mitral valve  regurgitation. No evidence of mitral stenosis.   4. The aortic valve is tricuspid. Aortic valve regurgitation is not  visualized. No aortic stenosis is present.   5. The inferior vena cava is normal in size with greater than 50%  respiratory variability, suggesting right atrial pressure of 3 mmHg.   Comparison(s): No significant change from prior study.   Conclusion(s)/Recommendation(s): Normal biventricular function without  evidence of hemodynamically significant valvular heart disease.  Cardiac Cath 05/04/20  Ost LAD to Prox LAD lesion is 30% stenosed.  RPDA lesion is 50% stenosed.  The left ventricular systolic function is normal.  LV end diastolic pressure is mildly elevated.  The left ventricular ejection fraction is 55-65% by visual estimate.   1. Nonobstructive CAD. Unchanged from 2017. 2. Normal LV function 3. Mildly elevated LVEDP 20 mm Hg   Plan: medical management.          Coronary CTA IMPRESSION: 1. Coronary calcium score of 239. This was 30 percentile for age and sex matched control.   2. Normal coronary origin with right dominance.   3. 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 We will send additional analysis for CT FFT evaluation once available.   4. Mildly dilated pulmonary artery measuring 31 x 27 mm suspicious for pulmonary hypertension.   Tobias Alexander     Electronically Signed   By: Tobias Alexander   On: 08/15/2016 21:18         ASSESSMENT:    1. Coronary artery disease involving  native coronary artery of native heart without angina pectoris   2. Essential  hypertension   3. Hyperlipidemia, unspecified hyperlipidemia type   4. Palpitations   5. Class 3 severe obesity due to excess calories with serious comorbidity and body mass index (BMI) of 40.0 to 44.9 in adult (HCC)   6. Diabetes mellitus without complication (HCC)      PLAN:  In order of problems listed above:  CAD status post cath showed nonobstructive CAD 05/04/20 no change from 2017. medical therapy recommended. Echo also normal.  Patient continued to have exertional chest pain and her left arm and dyspnea on exertion when going up stairs, an incline or dancing.  I added low-dose Imdur 15 mg once daily.   She has noticed a dramatic difference and no longer having chest pain. Will continue current treatment.   Essential hypertension blood pressure up today but hasn't had most of her meds. Controlled at home   Hyperlipidemia LDL 52 02/2020   Palpitations controlled on metoprolol   Obesity BMI 42 weight loss essential for overall health, 150 min exercise.   DM type 2 treated by PCP          Medication Adjustments/Labs and Tests Ordered: Current medicines are reviewed at length with the patient today.  Concerns regarding medicines are outlined above.  Medication changes, Labs and Tests ordered today are listed in the Patient Instructions below. Patient Instructions  Medication Instructions:  Your physician recommends that you continue on your current medications as directed. Please refer to the Current Medication list given to you today.  *If you need a refill on your cardiac medications before your next appointment, please call your pharmacy*   Lab Work: None If you have labs (blood work) drawn today and your tests are completely normal, you will receive your results only by: Marland Kitchen MyChart Message (if you have MyChart) OR . A paper copy in the mail If you have any lab test that is abnormal or we need to change your treatment, we will call you to review the  results.   Follow-Up: At [facility], you and your health needs are our priority.  As part of our continuing mission to provide you with exceptional heart care, we have created designated Provider Care Teams.  These Care Teams include your primary Cardiologist (physician) and Advanced Practice Providers (APPs -  Physician Assistants and Nurse Practitioners) who all work together to provide you with the care you need, when you need it.  We recommend signing up for the patient portal called "MyChart".  Sign up information is provided on this After Visit Summary.  MyChart is used to connect with patients for Virtual Visits (Telemedicine).  Patients are able to view lab/test results, encounter notes, upcoming appointments, etc.  Non-urgent messages can be sent to your provider as well.   To learn more about what you can do with MyChart, go to [email or web address].    Your next appointment:   6 month(s)  The format for your next appointment:   In Person  Provider:   You may see Tobias Alexander, MD or one of the following Advanced Practice Providers on your designated Care Team:    Ronie Spies, PA-C  Jacolyn Reedy, PA-C    Other Instructions Your provider recommends that you maintain 150 minutes per week of moderate aerobic activity.      Elson Clan, PA-C  09/15/2020 7:58 AM    [facility] [address] [place], [place], [place]  [address] Phone: [phone]; Fax: [phone]

## 2020-09-15 ENCOUNTER — Encounter: Payer: Self-pay | Admitting: Physician Assistant

## 2020-09-15 ENCOUNTER — Ambulatory Visit (INDEPENDENT_AMBULATORY_CARE_PROVIDER_SITE_OTHER): Payer: Medicare Other | Admitting: Physician Assistant

## 2020-09-15 ENCOUNTER — Other Ambulatory Visit: Payer: Self-pay

## 2020-09-15 VITALS — BP 154/80 | HR 73 | Ht 61.5 in | Wt 230.4 lb

## 2020-09-15 DIAGNOSIS — E119 Type 2 diabetes mellitus without complications: Secondary | ICD-10-CM

## 2020-09-15 DIAGNOSIS — R002 Palpitations: Secondary | ICD-10-CM

## 2020-09-15 DIAGNOSIS — Z6841 Body Mass Index (BMI) 40.0 and over, adult: Secondary | ICD-10-CM

## 2020-09-15 DIAGNOSIS — I2510 Atherosclerotic heart disease of native coronary artery without angina pectoris: Secondary | ICD-10-CM

## 2020-09-15 DIAGNOSIS — I10 Essential (primary) hypertension: Secondary | ICD-10-CM

## 2020-09-15 DIAGNOSIS — E785 Hyperlipidemia, unspecified: Secondary | ICD-10-CM

## 2020-09-15 NOTE — Patient Instructions (Signed)
Medication Instructions:  Your physician recommends that you continue on your current medications as directed. Please refer to the Current Medication list given to you today.  *If you need a refill on your cardiac medications before your next appointment, please call your pharmacy*   Lab Work: None If you have labs (blood work) drawn today and your tests are completely normal, you will receive your results only by: Marland Kitchen MyChart Message (if you have MyChart) OR . A paper copy in the mail If you have any lab test that is abnormal or we need to change your treatment, we will call you to review the results.   Follow-Up: At [facility], you and your health needs are our priority.  As part of our continuing mission to provide you with exceptional heart care, we have created designated Provider Care Teams.  These Care Teams include your primary Cardiologist (physician) and Advanced Practice Providers (APPs -  Physician Assistants and Nurse Practitioners) who all work together to provide you with the care you need, when you need it.  We recommend signing up for the patient portal called "MyChart".  Sign up information is provided on this After Visit Summary.  MyChart is used to connect with patients for Virtual Visits (Telemedicine).  Patients are able to view lab/test results, encounter notes, upcoming appointments, etc.  Non-urgent messages can be sent to your provider as well.   To learn more about what you can do with MyChart, go to [email or web address].    Your next appointment:   6 month(s)  The format for your next appointment:   In Person  Provider:   You may see Tobias Alexander, MD or one of the following Advanced Practice Providers on your designated Care Team:    Ronie Spies, PA-C  Jacolyn Reedy, PA-C    Other Instructions Your provider recommends that you maintain 150 minutes per week of moderate aerobic activity.

## 2020-09-20 ENCOUNTER — Telehealth: Payer: Self-pay | Admitting: Internal Medicine

## 2020-09-20 ENCOUNTER — Other Ambulatory Visit: Payer: Self-pay

## 2020-09-20 ENCOUNTER — Other Ambulatory Visit: Payer: Self-pay | Admitting: Internal Medicine

## 2020-09-20 DIAGNOSIS — I10 Essential (primary) hypertension: Secondary | ICD-10-CM

## 2020-09-20 MED ORDER — AMLODIPINE BESYLATE 5 MG PO TABS: 5.0000 mg | ORAL_TABLET | Freq: Every day | ORAL | 3 refills | Status: DC

## 2020-09-20 NOTE — Telephone Encounter (Signed)
Rx done patient aware 

## 2020-09-21 ENCOUNTER — Ambulatory Visit (INDEPENDENT_AMBULATORY_CARE_PROVIDER_SITE_OTHER): Payer: Medicare Other

## 2020-09-21 ENCOUNTER — Other Ambulatory Visit: Payer: Self-pay

## 2020-09-21 DIAGNOSIS — Z23 Encounter for immunization: Secondary | ICD-10-CM

## 2020-10-26 IMAGING — MG DIGITAL SCREENING BILATERAL MAMMOGRAM WITH TOMO AND CAD
8 of 14 series · 8 of 40 positions shown · non-contrast
Comparison: Previous exam(s).

CLINICAL DATA: Screening.

EXAM:
DIGITAL SCREENING BILATERAL MAMMOGRAM WITH TOMO AND CAD

[L CC synth-2D]
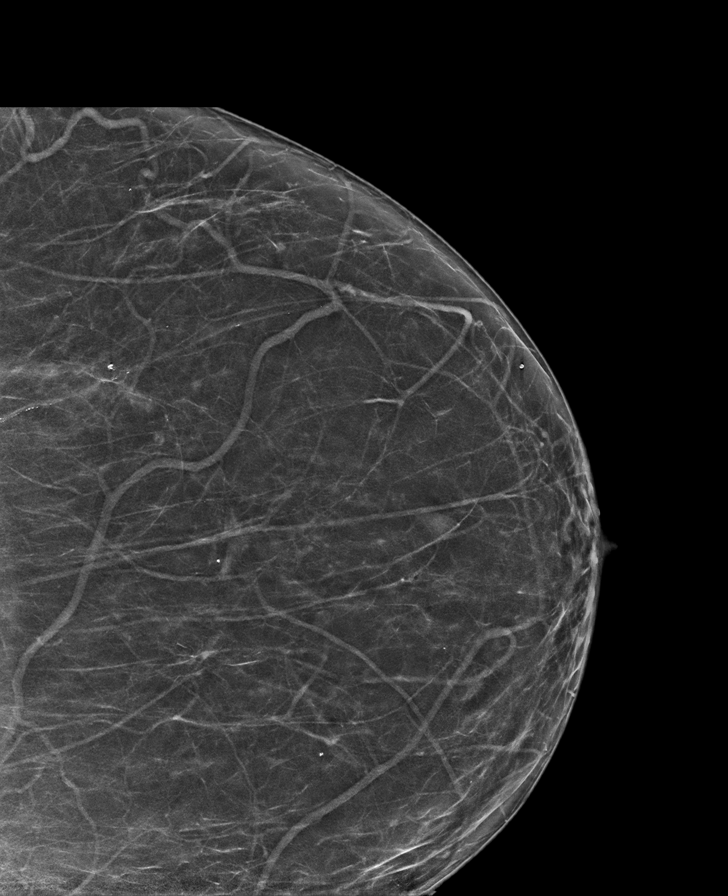

[R CC synth-2D]
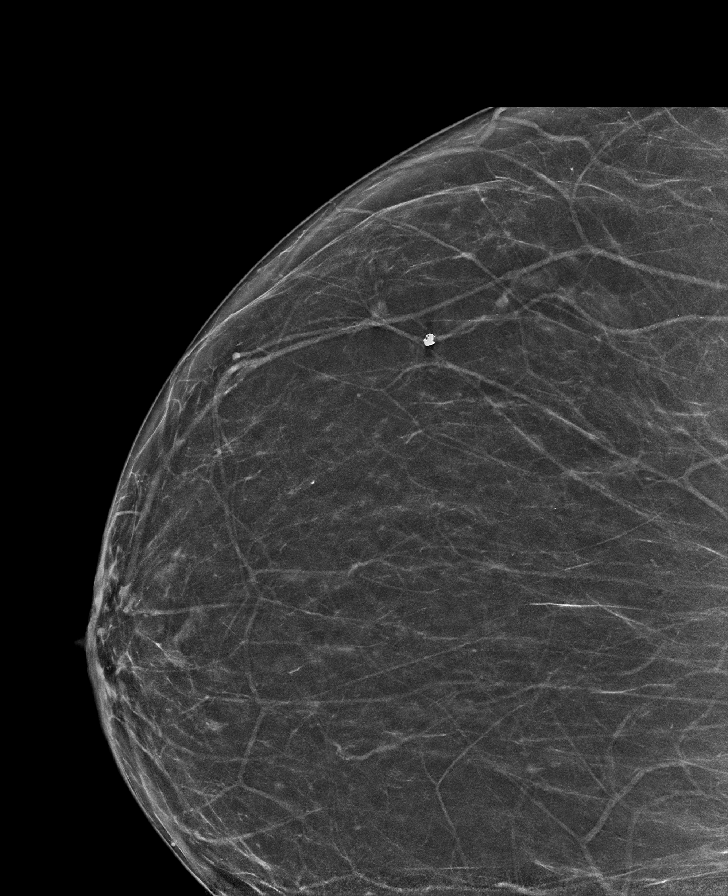

[R MLO synth-2D (1 of 2)]
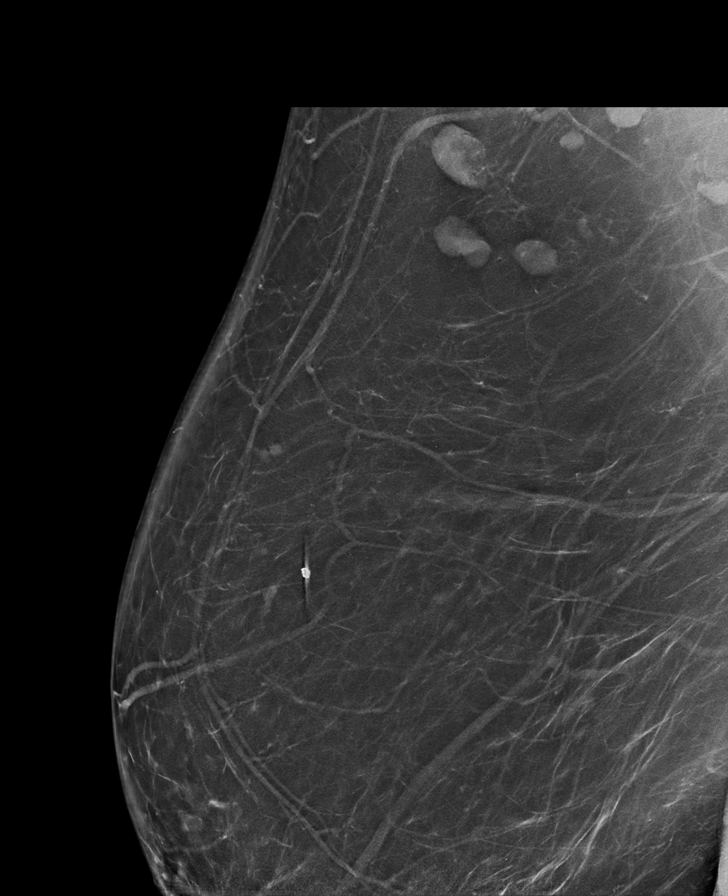

[R MLO synth-2D (2 of 2)]
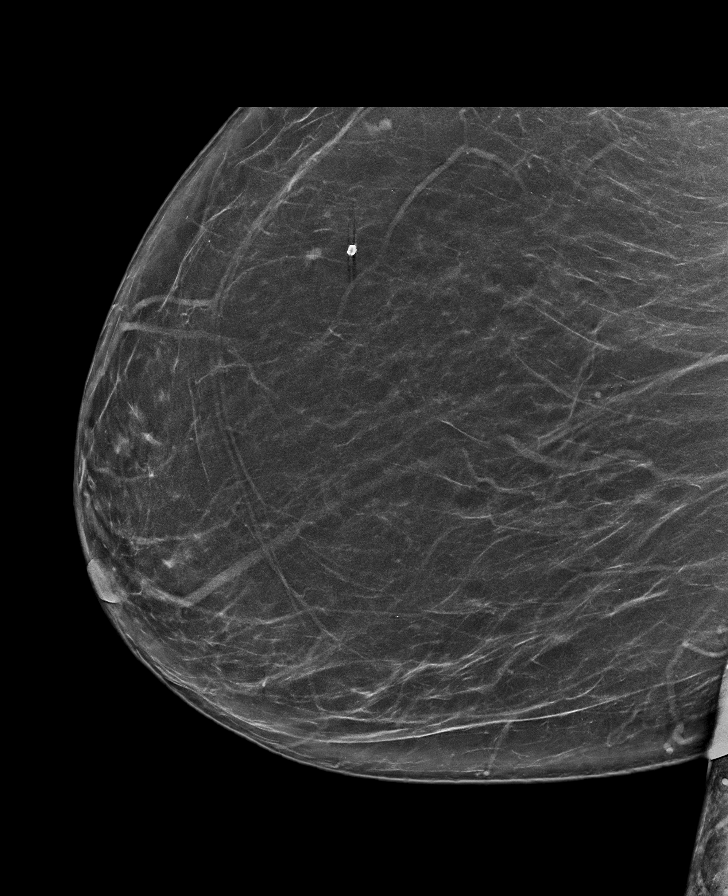

[L CV synth-2D]
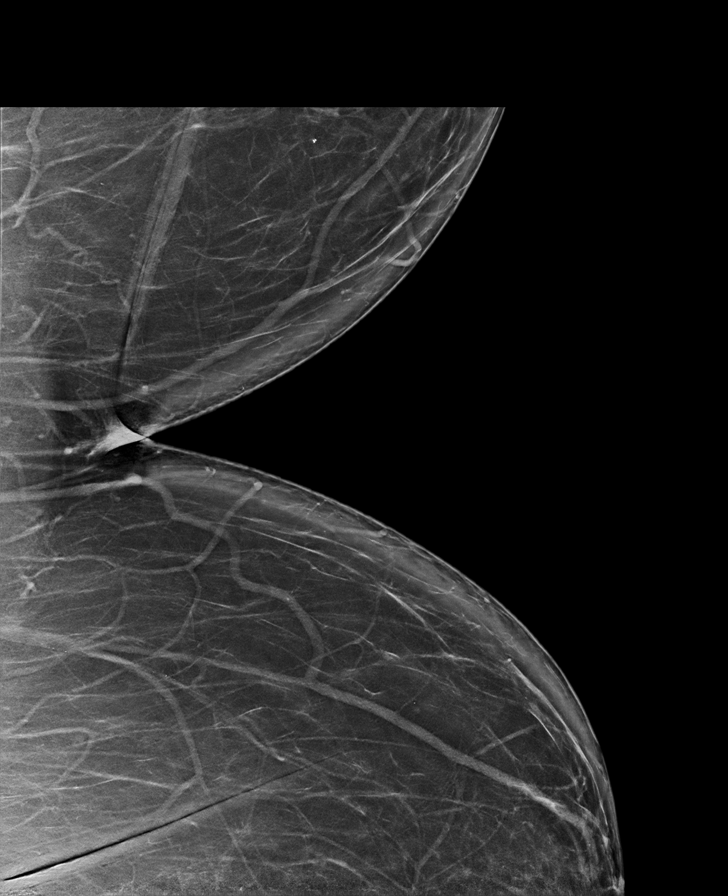

[L MLO synth-2D (1 of 2)]
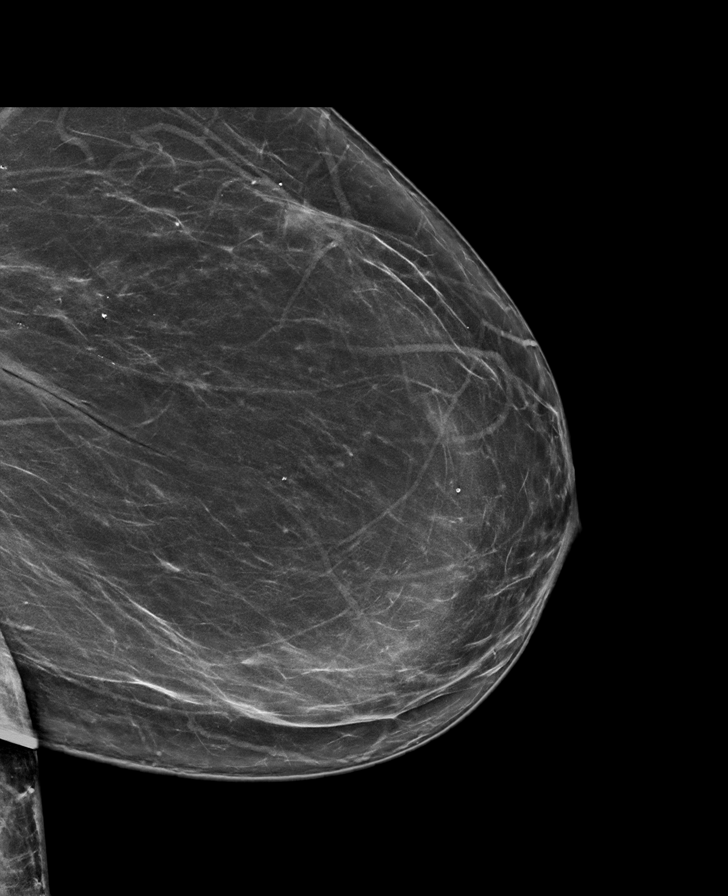

[L MLO synth-2D (2 of 2)]
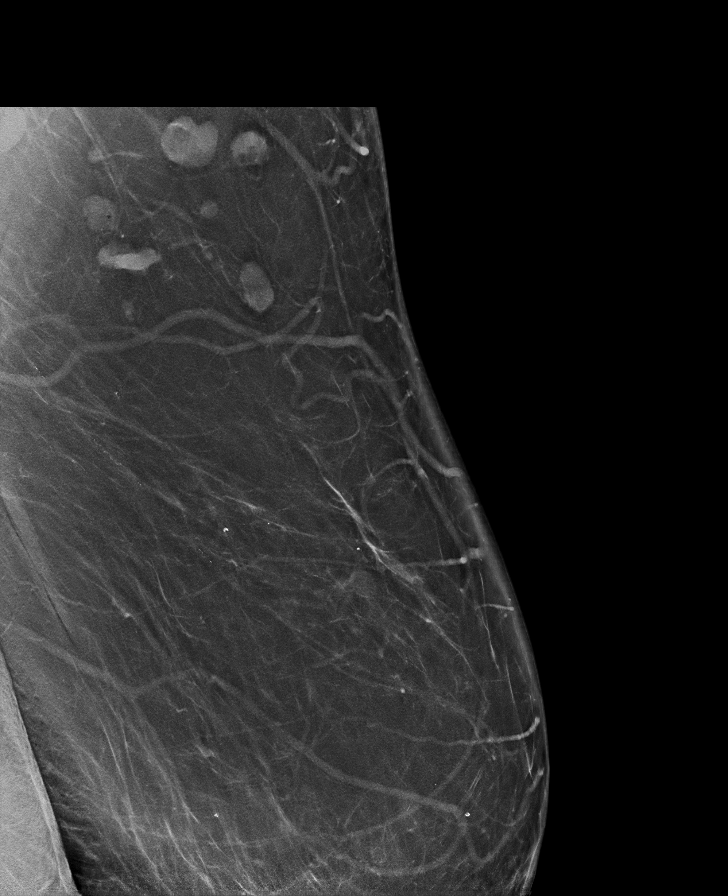

[L MLO tomo · tomo slice 44/87.0]
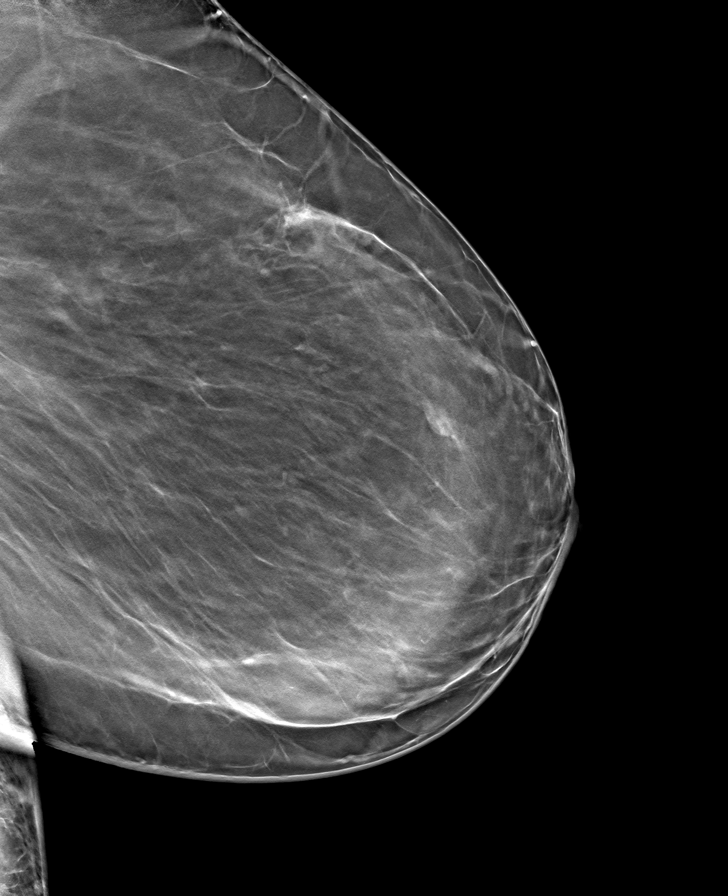

[8 of 40 positions shown; findings below may reference images not displayed]

ACR Breast Density Category b: There are scattered areas of
fibroglandular density.
FINDINGS: There are no findings suspicious for malignancy. Images were
processed with CAD.
IMPRESSION: No mammographic evidence of malignancy. A result letter of this
screening mammogram will be mailed directly to the patient.

RECOMMENDATION:
Screening mammogram in one year. (Code:[id number])

BI-RADS CATEGORY  1: Negative.

## 2021-01-25 ENCOUNTER — Ambulatory Visit (INDEPENDENT_AMBULATORY_CARE_PROVIDER_SITE_OTHER): Payer: Medicare Other | Admitting: Internal Medicine

## 2021-01-25 ENCOUNTER — Other Ambulatory Visit: Payer: Self-pay

## 2021-01-25 ENCOUNTER — Encounter: Payer: Self-pay | Admitting: Internal Medicine

## 2021-01-25 VITALS — BP 130/80 | HR 72 | Resp 12 | Ht 61.5 in | Wt 221.5 lb

## 2021-01-25 DIAGNOSIS — M5441 Lumbago with sciatica, right side: Secondary | ICD-10-CM | POA: Diagnosis not present

## 2021-01-25 DIAGNOSIS — G8929 Other chronic pain: Secondary | ICD-10-CM

## 2021-01-25 DIAGNOSIS — M546 Pain in thoracic spine: Secondary | ICD-10-CM | POA: Diagnosis not present

## 2021-01-25 MED ORDER — CYCLOBENZAPRINE HCL 10 MG PO TABS: ORAL_TABLET | ORAL | 2 refills | Status: DC

## 2021-01-25 MED ORDER — IBUPROFEN 200 MG PO TABS: ORAL_TABLET | ORAL | 0 refills | Status: DC

## 2021-01-25 NOTE — Patient Instructions (Signed)
Take the ibuprofen 400 to 800 mg twice daily with meals for 2 weeks and then use every 6 hours as needed. Take Cyclobenzaprine 1/2 to 1 tab at bedtime for 2 weeks and then only as needed thereafter

## 2021-01-25 NOTE — Progress Notes (Signed)
Subjective:    Patient ID: Haley [name], female   DOB: [birth year], 65 y.o.   MRN: [id number]   HPI   Right flank down to low back/pelvic and up to mid thoracic pain for months. No history of injury. When gets down into low back, can cross over to left low back.  At times, feels like goes into posterior right thigh.   Back hurts more if going upstairs/hills or bending forward.  No loss of bowel or urine control .   No definite foot drop. Has some old flexeril she has used with some improvement. Ran out of Meloxicam some time ago. Naproxen and Tylenol without help. Generally, has better response to ibuprofen, but has not tried for this particular pain.  Current Meds  Medication Sig  . amLODipine (NORVASC) 5 MG tablet Take 1 tablet (5 mg total) by mouth daily.  Marland Kitchen aspirin 81 MG tablet Take 81 mg by mouth daily.  . cetirizine (ZYRTEC) 10 MG tablet Take 1 tablet (10 mg total) by mouth daily.  . Cholecalciferol (VITAMIN D) 2000 units CAPS Take 2,000 Units by mouth daily. 1 daily  . GARLIC PO Take 2 capsules by mouth daily. 2 daily  . glimepiride (AMARYL) 2 MG tablet TAKE 1 & 1/2 (ONE & ONE-HALF) TABLETS BY MOUTH ONCE DAILY WITH BREAKFAST  . isosorbide mononitrate (IMDUR) 30 MG 24 hr tablet Take 0.5 tablets (15 mg total) by mouth daily.  Marland Kitchen lisinopril (ZESTRIL) 40 MG tablet Take 1 tablet by mouth once daily  . metoprolol tartrate (LOPRESSOR) 25 MG tablet Take 1 tablet (25 mg total) by mouth 2 (two) times daily.  . Misc Natural Products (TART CHERRY ADVANCED PO) Take 5 mLs by mouth daily.   . mometasone (NASONEX) 50 MCG/ACT nasal spray USE 2 SPRAY(S) IN EACH NOSTRIL ONCE DAILY  . Multiple Vitamins-Minerals (MULTIVITAMIN WITH MINERALS) tablet Take 1 tablet by mouth daily.  . nitroGLYCERIN (NITROSTAT) 0.4 MG SL tablet DISSOLVE ONE TABLET UNDER THE TONGUE EVERY 5 MINUTES AS NEEDED FOR CHEST PAIN.  DO NOT EXCEED A TOTAL OF 3 DOSES IN 15 MINUTES  . omega-3 acid ethyl esters (LOVAZA) 1 g  capsule Take 1 g by mouth 2 (two) times daily.  . rosuvastatin (CRESTOR) 10 MG tablet Take 1 tablet (10 mg total) by mouth daily.  . vitamin C (ASCORBIC ACID) 500 MG tablet Take 500 mg by mouth daily.  Marland Kitchen zinc gluconate 50 MG tablet Take 50 mg by mouth daily.  . [DISCONTINUED] amLODipine (NORVASC) 5 MG tablet Take 1 tablet by mouth once daily   Allergies  Allergen Reactions  . Praluent [Alirocumab]     Had significant fatigue and memory loss--was lost driving in town.  Joint pain and nausea as well.  Marland Kitchen Zetia [Ezetimibe] Other (See Comments)    Joint pain and ear ringing  . Atorvastatin Other (See Comments)    Pt states causes bilateral lower extremity muscle cramps  . Pravastatin Nausea And Vomiting and Other (See Comments)    Causes dizziness     Review of Systems    Objective:   BP 130/80 (BP Location: Right Arm, Patient Position: Sitting, Cuff Size: Large)   Pulse 72   Resp 12   Ht 5' 1.5" (1.562 m)   Wt 221 lb 8 oz (100.5 kg)   BMI 41.17 kg/m   Physical Exam  NAD Back:  Tender over cervical spinous processes, but not thoracic or L/S spinous processes.  Tender over traps bilaterally and down  medial to right scapula with palpable muscle spasm on right as well. Tender over right more so than left L/S paraspinous musculature.   Neuro:  A & O x 3, CN  II-XII grossly intact Motor 5/5 and DTRs 2+/4 throughout.  Gait normal. Sensation to light touch normal.   Assessment & Plan   Back pain:  Mainly cervical/upper thoracic and L/S spine predominantly on Right Appears to have right S2 radiculopathy Ibuprofen 400-800 mg twice daily with food for 2 weeks Cyclobenzaprine 5-10 mg at bedtime for 2 weeks Both meds to be used only as needed thereafter. Remain physically active--discussed going to pool and walking laps or actually learning how to swim! PT referral

## 2021-01-26 DIAGNOSIS — G8929 Other chronic pain: Secondary | ICD-10-CM | POA: Insufficient documentation

## 2021-01-26 DIAGNOSIS — M545 Low back pain, unspecified: Secondary | ICD-10-CM | POA: Insufficient documentation

## 2021-01-27 ENCOUNTER — Other Ambulatory Visit: Payer: Self-pay | Admitting: Internal Medicine

## 2021-02-15 ENCOUNTER — Ambulatory Visit: Payer: Medicare Other | Attending: Internal Medicine

## 2021-02-15 ENCOUNTER — Other Ambulatory Visit: Payer: Self-pay

## 2021-02-15 DIAGNOSIS — R269 Unspecified abnormalities of gait and mobility: Secondary | ICD-10-CM | POA: Diagnosis present

## 2021-02-15 DIAGNOSIS — Z7409 Other reduced mobility: Secondary | ICD-10-CM | POA: Insufficient documentation

## 2021-02-15 DIAGNOSIS — M6281 Muscle weakness (generalized): Secondary | ICD-10-CM | POA: Diagnosis not present

## 2021-02-15 NOTE — Therapy (Signed)
[facility] Outpatient Rehabilitation [facility] [address] [place], [place], [address] Phone: [phone]   Fax:  [phone]  Physical Therapy Evaluation  Patient Details  Name: Haley [name] MRN: [id number] Date of Birth: [birth year] Referring Provider (PT): Julieanne Manson, MD   Encounter Date: 02/15/2021   PT End of Session - 02/15/21 1333    Visit Number 1    Number of Visits 21    Date for PT Re-Evaluation 04/29/21    Authorization Type UHC MCR/MCD    Progress Note Due on Visit 10    PT Start Time 1130    PT Stop Time 1216    PT Time Calculation (min) 46 min    Activity Tolerance Patient limited by pain    Behavior During Therapy [facility] for tasks assessed/performed           Past Medical History:  Diagnosis Date  . Allergy   . Back pain   . Depression   . DM type 2, controlled, with complication (HCC) 07/22/2013   Type II DM diagnosed in ~ 2006 per pt hx.   . Ganglion cyst of wrist, right 05/09/2017  . Hyperlipidemia   . Hypertension   . Itching   . Shingles   . Stroke [facility]) 2000   per pt. report    Past Surgical History:  Procedure Laterality Date  . ABDOMINAL HYSTERECTOMY  1994   Laparoscopic; ovaries still intact.  Performed for prolapsed uterus.  Marland Kitchen CARDIAC CATHETERIZATION N/A 10/23/2016   Procedure: Left Heart Cath and Coronary Angiography;  Surgeon: Kathleene Hazel, MD;  Location: [facility] INVASIVE CV LAB;  Service: Cardiovascular;  Laterality: N/A;  . CHOLECYSTECTOMY  1997   Laparoscopic  . COLONOSCOPY  2005-at age 56   in [place]-polyps removed per pt   . LEFT HEART CATH AND CORONARY ANGIOGRAPHY N/A 05/04/2020   Procedure: LEFT HEART CATH AND CORONARY ANGIOGRAPHY;  Surgeon: Swaziland, Peter M, MD;  Location: [facility] INVASIVE CV LAB;  Service: Cardiovascular;  Laterality: N/A;  . POLYPECTOMY    . WISDOM TOOTH EXTRACTION      There were no vitals filed for this visit.    Subjective Assessment - 02/15/21 1133    Subjective Pt is  a pleasant 65 y/o F who presents to PT with reports of chronic LBP and discomfort. She notes most of her pain is when she rises from a forward flexed position. Pt denies referral of pain into LEs, but does report pain in ride side of lower back. She has had previous therapy with success for right sided LBP. Pt also denies red flag items such as bowel/bladder changes or saddle anesthesia. She had an instance a few months ago of parethesias down her R LE, although this resolved quickly and has not bothered her since. No recent MOI and she has been using techniques from previous therapy to address pain with some success.    Limitations Standing;Walking;Sitting    How long can you sit comfortably? 2-3 hours    How long can you stand comfortably? 30 minutes    How long can you walk comfortably? 10-15 minutes    Patient Stated Goals patient would like to decrease LBP in order to improve comfort and waking ability    Currently in Pain? Yes    Pain Score 5     Pain Location Back    Pain Orientation Right;Lower    Pain Descriptors / Indicators Sharp    Pain Type Chronic pain    Aggravating Factors  rising from forward flexed position, standing, walking - 10/10 at worst    Pain Relieving Factors compression to painful side              [facility] PT Assessment - 02/15/21 0001      Assessment   Medical Diagnosis M54.41,G89.29 (ICD-10-CM) - Chronic bilateral low back pain with right-sided sciatica    Referring Provider (PT) Julieanne Manson, MD    Hand Dominance Right    Prior Therapy Yes - for R sided LBP, with success      Precautions   Precautions None      Restrictions   Weight Bearing Restrictions No      Balance Screen   Has the patient fallen in the past 6 months No    Has the patient had a decrease in activity level because of a fear of falling?  No    Is the patient reluctant to leave their home because of a fear of falling?  No      Home Environment   Living Environment Private  residence    Living Arrangements Alone    Type of Home Apartment    Home Access Level entry    Home Layout One level    Home Equipment None      Prior Function   Level of Independence Independent    Vocation Retired      Observation/Other Assessments   Focus on Therapeutic Outcomes (FOTO)  61%      Sensation   Light Touch Appears Intact      Posture/Postural Control   Posture Comments --   increased lumbar lordosis, slouched sitting posture     ROM / Strength   AROM / PROM / Strength AROM;Strength      AROM   Lumbar Flexion 72    Lumbar Extension 18   pain     Strength   Right Hip Flexion 3/5   painful   Right Hip ABduction 3/5    Right Hip ADduction 3/5    Left Hip Flexion 3+/5    Left Hip ABduction 3/5    Left Hip ADduction 3/5    Right Knee Flexion 3+/5    Right Knee Extension 4/5    Left Knee Flexion 3+/5    Left Knee Extension 4/5    Right Ankle Dorsiflexion 5/5    Right Ankle Plantar Flexion 5/5    Left Ankle Dorsiflexion 5/5    Left Ankle Plantar Flexion 5/5      Palpation   Palpation comment very TTP to R PSIS, lumbar paraspinals      Bed Mobility   Bed Mobility Supine to Sit    Supine to Sit --   log roll technique, difficult     Transfers   Five time sit to stand comments  41 seconds      Ambulation/Gait   Ambulation/Gait Yes    Ambulation/Gait Assistance 7: Independent    Ambulation Distance (Feet) 50 Feet    Gait Pattern Wide base of support;Antalgic;Trendelenburg    Ambulation Surface Level                      Objective measurements completed on examination: See above findings.       [facility] Adult PT Treatment/Exercise - 02/15/21 0001      Knee/Hip Exercises: Stretches   Other Knee/Hip Stretches seated hamstring stretch x 30 sec R LE      Knee/Hip Exercises: Seated   Ball Squeeze x 10 -  5 sec hold    Clamshell with TheraBand Blue   x 10                 PT Education - 02/15/21 1333    Education Details  education on current condition, eval findings, POC, and HEP    Person(s) Educated Patient    Methods Explanation;Demonstration;Handout    Comprehension Verbalized understanding;Returned demonstration            PT Short Term Goals - 02/15/21 1340      PT SHORT TERM GOAL #1   Title Pt will be compliant and knowledgeable with 90% of HEP in order to improve carryover between sessions    Baseline initial HEP given    Time 3    Period Weeks    Status New    Target Date 03/08/21      PT SHORT TERM GOAL #2   Title Pt will decrease 5xSTS time to no greater than 20 seconds in order to improve safety and functional mobility    Baseline 41 seconds    Time 3    Period Weeks    Status New    Target Date 03/08/21             PT Long Term Goals - 02/15/21 1341      PT LONG TERM GOAL #1   Title Pt will improve FOTO score to at least 64% in order to improve confidence and functional ability    Baseline 61% initial    Time 10    Period Weeks    Status New    Target Date 04/26/21      PT LONG TERM GOAL #2   Title Pt will be able to stand comfortably for 1 hour in order to improve activity tolerance and increase activity level    Baseline 30 minutes    Time 10    Period Weeks    Status New    Target Date 04/26/21      PT LONG TERM GOAL #3   Title Increase LE strength grossly 1 MMT grade to improve ability for stair navigation and chores at home    Baseline see flowsheet    Time 10    Period Weeks    Status New    Target Date 04/26/21      PT LONG TERM GOAL #4   Title Pt will self report LBP no greater than 3/10 at worst in order to improve comfort and functional ability    Baseline 10/10 at worst    Time 10    Period Weeks    Status New    Target Date 04/26/21                  Plan - 02/15/21 1335    Clinical Impression Statement Pt is a pleasant 65 y/o F who presents to PT with reports of chronic LBP and discomfort. Physical findings are consistent with MD  impression, as pt has marked decrease in functional mobility and pain with palpation to R sided lumbar paraspinals and R PSIS. When trying to lay supine pt was unable to maintain position, as this greatly increased her LBP. She tolerated R sidelying much better and the rest of exam was performed in seated position. Pt is at a an increased risk for fall secondary to 5xSTS score. She would benefit from skilled PT services working on improve core endurance and proximal hip strengthening in order to decrease pain and improve functional ability.  Personal Factors and Comorbidities Comorbidity 2    Comorbidities DMII, HTN    Examination-Activity Limitations Sit;Sleep;Squat;Stand;Stairs;Bend    Examination-Participation Restrictions Shop;Yard Work;Community Activity    Stability/Clinical Decision Making Stable/Uncomplicated    Clinical Decision Making Low    Rehab Potential Good    PT Frequency 2x / week    PT Duration Other (comment)   10 weeks   PT Treatment/Interventions ADLs/Self Care Home Management;Electrical Stimulation;Moist Heat;Traction;Gait training;Stair training;Functional mobility training;Therapeutic activities;Therapeutic exercise;Balance training;Neuromuscular re-education;Patient/family education;Manual techniques;Dry needling;Spinal Manipulations;Joint Manipulations    PT Next Visit Plan assess response to initial HEP, check pt's bed mobility, manual to decrease LBP, progress strengthening exercises as able    PT Home Exercise Plan [id number]    Consulted and Agree with Plan of Care Patient           Patient will benefit from skilled therapeutic intervention in order to improve the following deficits and impairments:  Abnormal gait,Decreased activity tolerance,Decreased balance,Decreased range of motion,Decreased endurance,Decreased strength,Difficulty walking,Pain  Visit Diagnosis: Muscle weakness (generalized) - Plan: PT plan of care cert/re-cert  Impaired gait - Plan: PT plan  of care cert/re-cert  Impaired functional mobility, balance, and endurance - Plan: PT plan of care cert/re-cert     Problem List Patient Active Problem List   Diagnosis Date Noted  . Chronic bilateral low back pain with right-sided sciatica 01/26/2021  . Chronic right-sided thoracic back pain 01/26/2021  . Coronary artery disease involving native coronary artery of native heart with angina pectoris (HCC) 05/04/2020  . Diabetes mellitus without complication (HCC) 03/03/2020  . Environmental and seasonal allergies 03/03/2020  . Right lumbar radiculopathy 01/19/2019  . Acute pain of right knee 01/19/2019  . Cutaneous abscess of abdominal wall 06/25/2018  . Ganglion cyst of wrist, right 05/09/2017  . Coronary artery disease involving native coronary artery of native heart without angina pectoris   . Abnormal screening cardiac CT 10/16/2016  . Hyperlipidemia 08/03/2016  . Chest pain 08/03/2016  . Onychomycosis of toenail 04/18/2016  . GERD (gastroesophageal reflux disease) 02/27/2016  . Depression 02/27/2016  . Palpitation 03/19/2014  . Dyspnea on exertion 03/19/2014  . Fatigue 03/19/2014  . Essential hypertension 07/22/2013  . Obesity 07/22/2013  . DM type 2, controlled, with complication (HCC) 07/22/2013  . Allergic rhinitis 07/22/2013  . Stroke [facility](HCC) 2000    Eloy Endavid C Cecylia Brazill, PT, DPT 02/15/21 1:49 PM  [facility] [address] [place], [place], [address] Phone: [phone]   Fax:  [phone]  Name: Haley [name] MRN: [id number] Date of Birth: [birth year]

## 2021-02-22 ENCOUNTER — Encounter: Payer: Self-pay | Admitting: Internal Medicine

## 2021-02-22 ENCOUNTER — Other Ambulatory Visit: Payer: Self-pay

## 2021-02-22 ENCOUNTER — Ambulatory Visit (INDEPENDENT_AMBULATORY_CARE_PROVIDER_SITE_OTHER): Payer: Medicare Other | Admitting: Internal Medicine

## 2021-02-22 VITALS — BP 170/90 | HR 68 | Resp 20 | Ht 62.0 in | Wt 230.8 lb

## 2021-02-22 DIAGNOSIS — Z79899 Other long term (current) drug therapy: Secondary | ICD-10-CM

## 2021-02-22 DIAGNOSIS — E7800 Pure hypercholesterolemia, unspecified: Secondary | ICD-10-CM

## 2021-02-22 DIAGNOSIS — E119 Type 2 diabetes mellitus without complications: Secondary | ICD-10-CM | POA: Diagnosis not present

## 2021-02-22 DIAGNOSIS — Z1231 Encounter for screening mammogram for malignant neoplasm of breast: Secondary | ICD-10-CM

## 2021-02-22 DIAGNOSIS — Z0000 Encounter for general adult medical examination without abnormal findings: Secondary | ICD-10-CM | POA: Diagnosis not present

## 2021-02-22 DIAGNOSIS — I10 Essential (primary) hypertension: Secondary | ICD-10-CM

## 2021-02-22 MED ORDER — METOPROLOL SUCCINATE ER 50 MG PO TB24: 50.0000 mg | ORAL_TABLET | Freq: Every day | ORAL | 3 refills | Status: DC

## 2021-02-22 NOTE — Progress Notes (Signed)
Subjective:    Patient ID: Haley [name], female   DOB: Aug 17, 1956, 65 y.o.   MRN: [id number]   HPI   CPE without pap  1.  Pap:  S/P hysterectomy.  Always normal prior.  2.  Mammogram:  Last was 12.23.20.  States never received letter.    3.  Osteoprevention:  Normal bone density in 2019.  Taking Vitamin D 1,000 units daily.  Takes calcium, but not clear how much in a combination with magnesium and zinc.  Back to walking.     4.  Guaiac Cards:  Last in 2019.  5.  Colonoscopy:  Colonoscopy with Dr Lavon Paganini 02/2020.  Diverticulosis.  No polyps.  History of hyperplastic polyp in 2005.  6.  Immunizations:   Immunization History  Administered Date(s) Administered  . Influenza, High Dose Seasonal PF 07/24/2019  . Influenza, Quadrivalent, Recombinant, Inj, Pf 08/01/2016  . Influenza,inj,Quad PF,6+ Mos 12/28/2015, 07/27/2017, 08/04/2018  . Influenza-Unspecified 07/10/2017, 07/24/2019, 08/04/2020  . Moderna Sars-Covid-2 Vaccination 06/23/2020, 07/21/2020, 01/19/2021  . Pneumococcal Conjugate-13 09/19/2019  . Pneumococcal Polysaccharide-23 08/13/1996, 07/22/2013, 09/21/2020  . Td 11/12/1996  . Tdap 04/18/2016     7.  Glucose/Cholesterol:  Last A1C 6.1% 08/2020.  States 5.3% with [facility] nurse yesterday.  Last cholesterol panel quite good 1 year ago Lipid Panel     Component Value Date/Time   CHOL 120 03/03/2020 1144   TRIG 93 03/03/2020 1144   HDL 52 03/03/2020 1144   LDLCALC 50 03/03/2020 1144   LDLDIRECT 134 (H) 07/22/2013 1620   LABVLDL 18 03/03/2020 1144     Current Meds  Medication Sig  . amLODipine (NORVASC) 5 MG tablet Take 1 tablet (5 mg total) by mouth daily.  Marland Kitchen aspirin 81 MG tablet Take 81 mg by mouth daily.  . cetirizine (ZYRTEC) 10 MG tablet Take 1 tablet (10 mg total) by mouth daily.  . Cholecalciferol (VITAMIN D) 2000 units CAPS Take 2,000 Units by mouth daily. 1 daily  . cyclobenzaprine (FLEXERIL) 10 MG tablet 1/2 to 1 tab at bedtime as needed for  back pain  . GARLIC PO Take 2 capsules by mouth daily. 2 daily  . glimepiride (AMARYL) 2 MG tablet TAKE 1 & 1/2 (ONE & ONE-HALF) TABLETS BY MOUTH ONCE DAILY WITH BREAKFAST  . ibuprofen (ADVIL) 200 MG tablet 2-4 tabs by mouth twice daily with meals as needed  . isosorbide mononitrate (IMDUR) 30 MG 24 hr tablet Take 0.5 tablets (15 mg total) by mouth daily.  Marland Kitchen lisinopril (ZESTRIL) 40 MG tablet Take 1 tablet by mouth once daily  . metoprolol tartrate (LOPRESSOR) 25 MG tablet Take 1 tablet by mouth twice daily  . Misc Natural Products (TART CHERRY ADVANCED PO) Take 5 mLs by mouth daily.   . mometasone (NASONEX) 50 MCG/ACT nasal spray USE 2 SPRAY(S) IN EACH NOSTRIL ONCE DAILY  . Multiple Vitamins-Minerals (MULTIVITAMIN WITH MINERALS) tablet Take 1 tablet by mouth daily.  . nitroGLYCERIN (NITROSTAT) 0.4 MG SL tablet DISSOLVE ONE TABLET UNDER THE TONGUE EVERY 5 MINUTES AS NEEDED FOR CHEST PAIN.  DO NOT EXCEED A TOTAL OF 3 DOSES IN 15 MINUTES  . omega-3 acid ethyl esters (LOVAZA) 1 g capsule Take 1 g by mouth 2 (two) times daily.  . rosuvastatin (CRESTOR) 10 MG tablet Take 1 tablet (10 mg total) by mouth daily.  . vitamin C (ASCORBIC ACID) 500 MG tablet Take 500 mg by mouth daily.  Marland Kitchen zinc gluconate 50 MG tablet Take 50 mg by mouth daily.  Allergies  Allergen Reactions  . Praluent [Alirocumab]     Had significant fatigue and memory loss--was lost driving in town.  Joint pain and nausea as well.  Marland Kitchen. Zetia [Ezetimibe] Other (See Comments)    Joint pain and ear ringing  . Atorvastatin Other (See Comments)    Pt states causes bilateral lower extremity muscle cramps  . Pravastatin Nausea And Vomiting and Other (See Comments)    Causes dizziness   Past Medical History:  Diagnosis Date  . Back pain   . Depression   . DM type 2, controlled, with complication (HCC) 07/22/2013   Type II DM diagnosed in ~ 2006 per pt hx.   . Environmental and seasonal allergies   . Ganglion cyst of wrist, right 05/09/2017   . Hyperlipidemia   . Hypertension   . Itching   . Shingles   . Stroke [facility](HCC) 2000   per pt. report:   TIA    Past Surgical History:  Procedure Laterality Date  . ABDOMINAL HYSTERECTOMY  1994   Laparoscopic; ovaries still intact.  Performed for prolapsed uterus.  Marland Kitchen. CARDIAC CATHETERIZATION N/A 10/23/2016   Procedure: Left Heart Cath and Coronary Angiography;  Surgeon: Kathleene Hazelhristopher D McAlhany, MD;  [facility];  Service: Cardiovascular;  Laterality: N/A;  . CHOLECYSTECTOMY  1997   Laparoscopic  . COLONOSCOPY  2005-at age 65   Hyperplastic polyp--[place]  . LEFT HEART CATH AND CORONARY ANGIOGRAPHY N/A 05/04/2020   Procedure: LEFT HEART CATH AND CORONARY ANGIOGRAPHY;  Surgeon: SwazilandJordan, Peter M, MD;  [facility];  Service: Cardiovascular;  Laterality: N/A;  . WISDOM TOOTH EXTRACTION      Family History  Problem Relation Age of Onset  . Diabetes Father   . Stroke Maternal Aunt        Multiple aunts died from strokes-maternal  . Hypertension Son   . Diabetes Son   . Hypertension Brother   . Diabetes Brother   . Diabetes Sister        prediabetic  . Cancer Sister 4340       uterine cancer  . Seizures Brother    Social History   Socioeconomic History  . Marital status: Single    Spouse name: Not on file  . Number of children: 2  . Years of education: 12+  . Highest education level: Some college, no degree  Occupational History  . Occupation: Print production plannerLeasing Agent    Comment: Retired now  Tobacco Use  . Smoking status: Never Smoker  . Smokeless tobacco: Never Used  Vaping Use  . Vaping Use: Never used  Substance and Sexual Activity  . Alcohol use: Yes    Alcohol/week: 0.0 standard drinks    Comment: occasionally-1 to 2 drinks monthly   . Drug use: No  . Sexual activity: Not Currently    Birth control/protection: Surgical  Other Topics Concern  . Not on file  Social History Narrative   Did get some post high school training/education    Lives alone   [place] 2 great grandchildren frequently   [place]aughter lives in [place] and checks in regularly.   Social Determinants of Health   Financial Resource Strain: Low Risk   . Difficulty of Paying Living Expenses: Not hard at all  Food Insecurity: No Food Insecurity  . Worried About Programme researcher, broadcasting/film/videounning Out of Food in the Last Year: Never true  . Ran Out of Food in the Last Year: Never true  Transportation Needs: No Transportation Needs  .  Lack of Transportation (Medical): No  . Lack of Transportation (Non-Medical): No  Physical Activity: Not on file  Stress: Not on file  Social Connections: Not on file  Intimate Partner Violence: Not on file    Review of Systems  Constitutional: Negative for fatigue.  Respiratory: Negative for cough and shortness of breath.   Cardiovascular: Positive for palpitations (Misses metoprolol with evening dose frequently and her palpitations are in the evening mainly.). Negative for chest pain and leg swelling.  Gastrointestinal: Positive for constipation (periodically.). Negative for abdominal pain, blood in stool (No melena.) and diarrhea.  Genitourinary: Negative for vaginal bleeding and vaginal discharge.  Musculoskeletal: Positive for back pain (Not constantly in pain as before.  Has been going to PT.  Only one visit thus far--twice monthly for 10 weeks.).  Skin: Negative for rash.       Dry skin.  Neurological: Negative for weakness and numbness.  Psychiatric/Behavioral: Positive for dysphoric mood (Moments of depression.  Lots of dreams or quick memories of her father.  Not clear these dreams or memories associated with concerning feelings.  She also has been isolating since pandemic started. ).      Objective:   BP (!) 170/90 (BP Location: Left Arm, Patient Position: Sitting, Cuff Size: Large)   Pulse 68   Resp 20   Ht 5\' 2"  (1.575 m)   Wt 230 lb 12 oz (104.7 kg)   BMI 42.20 kg/m   Physical Exam Constitutional:      Appearance: She is  obese.  HENT:     Head: Normocephalic and atraumatic.     Right Ear: Hearing, tympanic membrane, ear canal and external ear normal.     Left Ear: Hearing, tympanic membrane, ear canal and external ear normal.     Nose: Nose normal.     Mouth/Throat:     Mouth: Mucous membranes are moist.     Pharynx: Oropharynx is clear.  Eyes:     Extraocular Movements: Extraocular movements intact.     Conjunctiva/sclera: Conjunctivae normal.     Pupils: Pupils are equal, round, and reactive to light.     Funduscopic exam:    Right eye: Red reflex present.     Comments: Discs sharp bilaterally.   Neck:     Thyroid: No thyromegaly.  Cardiovascular:     Rate and Rhythm: Normal rate and regular rhythm.     Pulses:          Dorsalis pedis pulses are 2+ on the right side and 2+ on the left side.       Posterior tibial pulses are 2+ on the right side and 2+ on the left side.     Heart sounds: S1 normal and S2 normal. No murmur heard. No friction rub. No S3 or S4 sounds.      Comments: No carotid bruits.  Carotid, radial, femoral, DP and PT pulses normal and equal.  Pulmonary:     Effort: Pulmonary effort is normal.     Breath sounds: Normal breath sounds.  Chest:  Breasts:     Right: No swelling, inverted nipple, mass, nipple discharge, skin change, tenderness, axillary adenopathy or supraclavicular adenopathy.     Left: No swelling, inverted nipple, mass, nipple discharge, skin change, tenderness, axillary adenopathy or supraclavicular adenopathy.    Abdominal:     General: Bowel sounds are normal.     Palpations: Abdomen is soft. There is no hepatomegaly, splenomegaly or mass.     Tenderness: There is no  abdominal tenderness.     Hernia: A hernia (Suprumbilical.  Unable to feel size of hernial opening.  Not reducible, but soft and no overlying discoloration.) is present.  Genitourinary:    Comments: Normal external female genitalia.  No uterine or adnexal mass or tenderness. Musculoskeletal:         General: Normal range of motion.     Cervical back: Normal range of motion and neck supple.     Right lower leg: No edema.     Left lower leg: No edema.     Right foot: No deformity.     Left foot: No deformity.  Feet:     Right foot:     Protective Sensation: 10 sites tested. 10 sites sensed.     Skin integrity: Dry skin (To ankle area) present.     Toenail Condition: Right toenails are normal.     Left foot:     Protective Sensation: 10 sites tested. 10 sites sensed.     Skin integrity: Dry skin (To ankle area) present.     Toenail Condition: Left toenails are normal.  Lymphadenopathy:     Head:     Right side of head: No submental or submandibular adenopathy.     Left side of head: No submental or submandibular adenopathy.     Cervical: No cervical adenopathy.     Upper Body:     Right upper body: No supraclavicular or axillary adenopathy.     Left upper body: No supraclavicular or axillary adenopathy.     Lower Body: No right inguinal adenopathy. No left inguinal adenopathy.  Skin:    General: Skin is warm.     Capillary Refill: Capillary refill takes less than 2 seconds.     Findings: No rash.  Neurological:     General: No focal deficit present.     Mental Status: She is alert.     Cranial Nerves: Cranial nerves are intact.     Sensory: Sensation is intact.     Motor: Motor function is intact.     Coordination: Coordination is intact.     Gait: Gait is intact.     Deep Tendon Reflexes: Reflexes are normal and symmetric.  Psychiatric:        Mood and Affect: Mood normal.        Behavior: Behavior normal.        Thought Content: Thought content normal.        Judgment: Judgment normal.      Assessment & Plan  1.  CPE Guaiac cards x 3 to return in 2 weeks. Mammogram ordered. CBC, CMP, A1C, FLP, urine microalbumin/crea. Recommend Herpes Zoster vaccination.   2.  Diverticulosis and constipation:  Metamucil with water increase to prevent constipation and  worsening of condition.  Went over how to increase dietary fiber as well.  3.  Hypertension:  Not well controlled.  Continue with walking.  Switch Beta blockade to 24 hour preparation.  Switch bp meds back to morning as missing when takes only in evening. Repeat bp check in 1 month.  4.  DM/hypercholesterolemia:  Labs above.

## 2021-02-22 NOTE — Patient Instructions (Addendum)
Encourage Herpes Zoster (shingles) vaccine  Eucerin Cream for Eczema Relief or Gold Bond foot cream more specifically for feet and ankles.

## 2021-02-23 LAB — LIPID PANEL W/O CHOL/HDL RATIO
Cholesterol, Total: 148 mg/dL (ref 100–199)
HDL: 45 mg/dL (ref >39)
LDL Chol Calc (NIH): 84 mg/dL (ref 0–99)
Triglycerides: 100 mg/dL (ref 0–149)
VLDL Cholesterol Cal: 19 mg/dL (ref 5–40)

## 2021-02-23 LAB — CBC WITH DIFFERENTIAL/PLATELET
Basophils Absolute: 0.1 10*3/uL (ref 0.0–0.2)
Basos: 1 %
EOS (ABSOLUTE): 0.2 10*3/uL (ref 0.0–0.4)
Eos: 3 %
Hematocrit: 43.6 % (ref 34.0–46.6)
Hemoglobin: 14.4 g/dL (ref 11.1–15.9)
Immature Grans (Abs): 0 10*3/uL (ref 0.0–0.1)
Immature Granulocytes: 0 %
Lymphocytes Absolute: 2.1 10*3/uL (ref 0.7–3.1)
Lymphs: 38 %
MCH: 31 pg (ref 26.6–33.0)
MCHC: 33 g/dL (ref 31.5–35.7)
MCV: 94 fL (ref 79–97)
Monocytes Absolute: 0.6 10*3/uL (ref 0.1–0.9)
Monocytes: 10 %
Neutrophils Absolute: 2.7 10*3/uL (ref 1.4–7.0)
Neutrophils: 48 %
Platelets: 239 10*3/uL (ref 150–450)
RBC: 4.64 x10E6/uL (ref 3.77–5.28)
RDW: 12.4 % (ref 11.7–15.4)
WBC: 5.6 10*3/uL (ref 3.4–10.8)

## 2021-02-23 LAB — MICROALBUMIN / CREATININE URINE RATIO
Creatinine, Urine: 25.4 mg/dL
Microalb/Creat Ratio: 39 mg/g creat — ABNORMAL HIGH (ref 0–29)
Microalbumin, Urine: 9.9 ug/mL

## 2021-02-23 LAB — COMPREHENSIVE METABOLIC PANEL
ALT: 25 IU/L (ref 0–32)
AST: 16 IU/L (ref 0–40)
Albumin/Globulin Ratio: 1.7 (ref 1.2–2.2)
Albumin: 4.6 g/dL (ref 3.8–4.8)
Alkaline Phosphatase: 58 IU/L (ref 44–121)
BUN/Creatinine Ratio: 12 (ref 12–28)
BUN: 13 mg/dL (ref 8–27)
Bilirubin Total: 0.2 mg/dL (ref 0.0–1.2)
CO2: 19 mmol/L — ABNORMAL LOW (ref 20–29)
Calcium: 9.6 mg/dL (ref 8.7–10.3)
Chloride: 105 mmol/L (ref 96–106)
Creatinine, Ser: 1.05 mg/dL — ABNORMAL HIGH (ref 0.57–1.00)
Globulin, Total: 2.7 g/dL (ref 1.5–4.5)
Glucose: 82 mg/dL (ref 65–99)
Potassium: 4.2 mmol/L (ref 3.5–5.2)
Sodium: 142 mmol/L (ref 134–144)
Total Protein: 7.3 g/dL (ref 6.0–8.5)
eGFR: 58 mL/min/{1.73_m2} — ABNORMAL LOW (ref >59)

## 2021-02-23 LAB — HGB A1C W/O EAG: Hgb A1c MFr Bld: 6 % — ABNORMAL HIGH (ref 4.8–5.6)

## 2021-03-04 ENCOUNTER — Other Ambulatory Visit: Payer: Self-pay

## 2021-03-04 ENCOUNTER — Ambulatory Visit: Payer: Medicare Other

## 2021-03-04 DIAGNOSIS — M6281 Muscle weakness (generalized): Secondary | ICD-10-CM

## 2021-03-04 DIAGNOSIS — Z7409 Other reduced mobility: Secondary | ICD-10-CM

## 2021-03-04 DIAGNOSIS — R269 Unspecified abnormalities of gait and mobility: Secondary | ICD-10-CM

## 2021-03-04 NOTE — Therapy (Signed)
[facility] [address] [place], [place], [address] Phone: [phone]   Fax:  [phone]  Physical Therapy Treatment  Patient Details  Name: Haley [name] MRN: [id number] Date of Birth: [birth year] Referring Provider (PT): Julieanne Manson, MD   Encounter Date: 03/04/2021   PT End of Session - 03/04/21 1237    Visit Number 2    Number of Visits 21    Date for PT Re-Evaluation 04/29/21    Authorization Type UHC MCR/MCD    Progress Note Due on Visit 10    PT Start Time 1233    PT Stop Time 1315    PT Time Calculation (min) 42 min    Activity Tolerance Patient limited by pain;Patient tolerated treatment well    Behavior During Therapy [facility] for tasks assessed/performed           Past Medical History:  Diagnosis Date  . Back pain   . Depression   . DM type 2, controlled, with complication (HCC) 07/22/2013   Type II DM diagnosed in ~ 2006 per pt hx.   . Environmental and seasonal allergies   . Ganglion cyst of wrist, right 05/09/2017  . Hyperlipidemia   . Hypertension   . Itching   . Shingles   . Stroke [facility]) 2000   per pt. report:   TIA    Past Surgical History:  Procedure Laterality Date  . ABDOMINAL HYSTERECTOMY  1994   Laparoscopic; ovaries still intact.  Performed for prolapsed uterus.  Marland Kitchen CARDIAC CATHETERIZATION N/A 10/23/2016   Procedure: Left Heart Cath and Coronary Angiography;  Surgeon: Kathleene Hazel, MD;  Location: [facility] INVASIVE CV LAB;  Service: Cardiovascular;  Laterality: N/A;  . CHOLECYSTECTOMY  1997   Laparoscopic  . COLONOSCOPY  2005-at age 55   Hyperplastic polyp--[place]  . LEFT HEART CATH AND CORONARY ANGIOGRAPHY N/A 05/04/2020   Procedure: LEFT HEART CATH AND CORONARY ANGIOGRAPHY;  Surgeon: [place], Peter M, MD;  Location: [facility] INVASIVE CV LAB;  Service: Cardiovascular;  Laterality: N/A;  . WISDOM TOOTH EXTRACTION      There were no vitals filed for this visit.   Subjective  Assessment - 03/04/21 1236    Subjective Pt reports her HEP is helping. "Last time I was here (2019), I could lay on my back, but for the last 6 months". The pain comes and goes, so unsure if she is doing something to aggravate it and then it goes away with pain meds.    Limitations Standing;Walking;Sitting    How long can you sit comfortably? 2-3 hours    How long can you stand comfortably? 30 minutes    How long can you walk comfortably? 10-15 minutes    Patient Stated Goals patient would like to decrease LBP in order to improve comfort and waking ability    Currently in Pain? Yes    Pain Score 5     Pain Location Back    Pain Orientation Right;Lower    Pain Descriptors / Indicators Sharp              [facility] PT Assessment - 03/04/21 0001      Palpation   Palpation comment significant TTP to R 12th rib and lumbar soft tissue inferior to lowest rib                         [facility] Adult PT Treatment/Exercise - 03/04/21 0001      Self-Care  Self-Care Other Self-Care Comments    Other Self-Care Comments  see pt edu      Exercises   Exercises Lumbar;Knee/Hip      Lumbar Exercises: Stretches   Other Lumbar Stretch Exercise able to tolerate supine lying for a few minutes with R LE extended      Lumbar Exercises: Aerobic   Nustep L5 LE only 6 min      Lumbar Exercises: Seated   Sit to Stand 10 reps    Sit to Stand Limitations with TrA activation before standing and sitting    Other Seated Lumbar Exercises hip ADD with ball, inhale to relax and exhale to squeeze to activate TrA      Lumbar Exercises: Sidelying   Other Sidelying Lumbar Exercises B open books with HBH and arm outstretched x 10 ea                  PT Education - 03/04/21 1435    Education Details Extensive education on posture, TrA activation with any transfers or position changes, pelvic tilting to neutral from anterior position she is in using skeleton to demonstrate, transferring from  sit <> sidelying versus supine <> Sitting to reduce back pain, added to HEP    Person(s) Educated Patient    Methods Explanation;Demonstration;Tactile cues;Verbal cues;Handout    Comprehension Verbalized understanding;Returned demonstration;Verbal cues required;Tactile cues required            PT Short Term Goals - 02/15/21 1340      PT SHORT TERM GOAL #1   Title Pt will be compliant and knowledgeable with 90% of HEP in order to improve carryover between sessions    Baseline initial HEP given    Time 3    Period Weeks    Status New    Target Date 03/08/21      PT SHORT TERM GOAL #2   Title Pt will decrease 5xSTS time to no greater than 20 seconds in order to improve safety and functional mobility    Baseline 41 seconds    Time 3    Period Weeks    Status New    Target Date 03/08/21             PT Long Term Goals - 02/15/21 1341      PT LONG TERM GOAL #1   Title Pt will improve FOTO score to at least 64% in order to improve confidence and functional ability    Baseline 61% initial    Time 10    Period Weeks    Status New    Target Date 04/26/21      PT LONG TERM GOAL #2   Title Pt will be able to stand comfortably for 1 hour in order to improve activity tolerance and increase activity level    Baseline 30 minutes    Time 10    Period Weeks    Status New    Target Date 04/26/21      PT LONG TERM GOAL #3   Title Increase LE strength grossly 1 MMT grade to improve ability for stair navigation and chores at home    Baseline see flowsheet    Time 10    Period Weeks    Status New    Target Date 04/26/21      PT LONG TERM GOAL #4   Title Pt will self report LBP no greater than 3/10 at worst in order to improve comfort and functional ability    Baseline  10/10 at worst    Time 10    Period Weeks    Status New    Target Date 04/26/21                 Plan - 03/04/21 1237    Clinical Impression Statement Pt presents with continued right-sided low back  pain today. She did well with modified sit <> supine to sidelying instead with no report of pain. She tolerated sit <> stand transfers well with no pain today after verbal and visual cues to activate TrA before sitting or standing with improved fluidity of transfer. Educated pt heavily on neutral spine and pelvic alignment with skeleton, pt verbalizing understanding, as well as optimal muscle fiber orientation for firing. Added to HEP and provided updated handout.    Personal Factors and Comorbidities Comorbidity 2    Comorbidities DMII, HTN    Examination-Activity Limitations Sit;Sleep;Squat;Stand;Stairs;Bend    Examination-Participation Restrictions Shop;Yard Work;Community Activity    Stability/Clinical Decision Making Stable/Uncomplicated    Rehab Potential Good    PT Frequency 2x / week    PT Duration Other (comment)   10 weeks   PT Treatment/Interventions ADLs/Self Care Home Management;Electrical Stimulation;Moist Heat;Traction;Gait training;Stair training;Functional mobility training;Therapeutic activities;Therapeutic exercise;Balance training;Neuromuscular re-education;Patient/family education;Manual techniques;Dry needling;Spinal Manipulations;Joint Manipulations    PT Next Visit Plan assess response to initial HEP, check pt's bed mobility, manual to decrease LBP, progress strengthening exercises as able    PT Home Exercise Plan [email or web address]    Consulted and Agree with Plan of Care Patient           Patient will benefit from skilled therapeutic intervention in order to improve the following deficits and impairments:  Abnormal gait,Decreased activity tolerance,Decreased balance,Decreased range of motion,Decreased endurance,Decreased strength,Difficulty walking,Pain  Visit Diagnosis: Muscle weakness (generalized)  Impaired gait  Impaired functional mobility, balance, and endurance     Problem List Patient Active Problem List   Diagnosis Date Noted  . Chronic bilateral low back  pain with right-sided sciatica 01/26/2021  . Chronic right-sided thoracic back pain 01/26/2021  . Coronary artery disease involving native coronary artery of native heart with angina pectoris (HCC) 05/04/2020  . Diabetes mellitus without complication (HCC) 03/03/2020  . Environmental and seasonal allergies 03/03/2020  . Right lumbar radiculopathy 01/19/2019  . Acute pain of right knee 01/19/2019  . Cutaneous abscess of abdominal wall 06/25/2018  . Ganglion cyst of wrist, right 05/09/2017  . Coronary artery disease involving native coronary artery of native heart without angina pectoris   . Abnormal screening cardiac CT 10/16/2016  . Hyperlipidemia 08/03/2016  . Chest pain 08/03/2016  . Onychomycosis of toenail 04/18/2016  . GERD (gastroesophageal reflux disease) 02/27/2016  . Depression 02/27/2016  . Palpitation 03/19/2014  . Dyspnea on exertion 03/19/2014  . Fatigue 03/19/2014  . Essential hypertension 07/22/2013  . Obesity 07/22/2013  . DM type 2, controlled, with complication (HCC) 07/22/2013  . Allergic rhinitis 07/22/2013  . Stroke [facility]) 2000    Marcelline Mates, PT, DPT 03/04/2021, 3:13 PM  [facility] [address] [place], [place], [address] Phone: [phone]   Fax:  [phone]  Name: Haley [name] MRN: [id number] Date of Birth: December 21, 1955

## 2021-03-08 ENCOUNTER — Other Ambulatory Visit: Payer: Self-pay

## 2021-03-08 ENCOUNTER — Encounter: Payer: Medicare Other | Admitting: Internal Medicine

## 2021-03-08 ENCOUNTER — Ambulatory Visit: Payer: Medicare Other | Attending: Internal Medicine

## 2021-03-08 DIAGNOSIS — R269 Unspecified abnormalities of gait and mobility: Secondary | ICD-10-CM | POA: Diagnosis present

## 2021-03-08 DIAGNOSIS — Z7409 Other reduced mobility: Secondary | ICD-10-CM | POA: Diagnosis present

## 2021-03-08 DIAGNOSIS — M6281 Muscle weakness (generalized): Secondary | ICD-10-CM | POA: Diagnosis not present

## 2021-03-08 NOTE — Patient Instructions (Signed)
Access Code: [email or web address] URL: [email or web address] Date: 03/08/2021 Prepared by: Gardiner Rhyme  Exercises Seated Hip Adduction Isometrics with Newman Pies - 1 x daily - 7 x weekly - 3 sets - 10 reps - 5 hold Seated Hip Abduction with Resistance - 1 x daily - 7 x weekly - 3 sets - 10 reps - 3 hold Seated Hamstring Stretch - 1 x daily - 7 x weekly - 1 sets - 3 reps - 30 hold Open Books - 1-2 x daily - 7 x weekly - 1 sets - 10 reps Sidelying Thoracic Rotation with Open Book - 1-2 x daily - 7 x weekly - 1 sets - 10 reps Sit to Stand - 1-2 x daily - 7 x weekly - 1-2 sets - 8-12 reps Clamshell with Resistance - 1 x daily - 7 x weekly - 1 sets - 10 reps Supine Figure 4 Piriformis Stretch - 1-2 x daily - 7 x weekly - 2-3 sets - 20-30 seconds hold Supine Piriformis Stretch with Foot on Ground - 1-2 x daily - 7 x weekly - 2-3 sets - 10 reps - 20-30 seconds hold

## 2021-03-08 NOTE — Therapy (Signed)
[place], [place], [address] Phone: [phone]   Fax:  [phone]  Physical Therapy Treatment  Patient Details  Name: Haley [name] MRN: [id number] Date of Birth: [birth year] Referring Provider (PT): Mack Hook, MD   Encounter Date: 03/08/2021   PT End of Session - 03/08/21 1538    Visit Number 3    Number of Visits 21    Date for PT Re-Evaluation 04/29/21    Authorization Type UHC MCR/MCD    Progress Note Due on Visit 10    PT Start Time 1333    PT Stop Time 1427    PT Time Calculation (min) 54 min    Activity Tolerance Patient limited by pain;Patient tolerated treatment well    Behavior During Therapy [facility] for tasks assessed/performed           Past Medical History:  Diagnosis Date  . Back pain   . Depression   . DM type 2, controlled, with complication ([place]) 2/64/1583   Type II DM diagnosed in ~ 2006 per pt hx.   . Environmental and seasonal allergies   . Ganglion cyst of wrist, right 05/09/2017  . Hyperlipidemia   . Hypertension   . Itching   . Shingles   . Stroke [facility]) 2000   per pt. report:   TIA    Past Surgical History:  Procedure Laterality Date  . ABDOMINAL HYSTERECTOMY  1994   Laparoscopic; ovaries still intact.  Performed for prolapsed uterus.  Marland Kitchen CARDIAC CATHETERIZATION N/A 10/23/2016   Procedure: Left Heart Cath and Coronary Angiography;  Surgeon: Burnell Blanks, MD;  Location: [place] CV LAB;  Service: Cardiovascular;  Laterality: N/A;  . CHOLECYSTECTOMY  1997   Laparoscopic  . COLONOSCOPY  28-at age 68   Hyperplastic polyp--[place]  . LEFT HEART CATH AND CORONARY ANGIOGRAPHY N/A 05/04/2020   Procedure: LEFT HEART CATH AND CORONARY ANGIOGRAPHY;  Surgeon: [place], Peter M, MD;  Location: [place] CV LAB;  Service: Cardiovascular;  Laterality: N/A;  . WISDOM TOOTH EXTRACTION      There were no vitals filed for this visit.   Subjective  Assessment - 03/08/21 1537    Subjective Pt reports she felt good after her last session. She went home and kept up what she was doing and feels a little better. She additionally reports her hips were really sore after her walks on Sunday and Monday.    Limitations Standing;Walking;Sitting    How long can you sit comfortably? 2-3 hours    How long can you stand comfortably? 30 minutes    How long can you walk comfortably? 10-15 minutes    Patient Stated Goals patient would like to decrease LBP in order to improve comfort and waking ability    Currently in Pain? No/denies    Pain Score 0-No pain    Pain Location Back                             [facility] Adult PT Treatment/Exercise - 03/08/21 0001      Transfers   Five time sit to stand comments  17.73s      Self-Care   Other Self-Care Comments  see pt edu      Lumbar Exercises: Stretches   Piriformis Stretch Right;Left;1 rep;20 seconds    Figure 4 Stretch 1 rep;20 seconds    Figure 4 Stretch Limitations tighter and some pain on R -  cues to be gentle    Other Lumbar Stretch Exercise tolerated supine lying pain free today      Lumbar Exercises: Aerobic   Nustep L5 LE only 6 min      Lumbar Exercises: Seated   Sit to Stand 10 reps    Sit to Stand Limitations with TrA activation before standing and sitting    Other Seated Lumbar Exercises hip ADD with ball, inhale to relax and exhale to squeeze to activate TrA and pelvic floor x15    Other Seated Lumbar Exercises hip ABD with band      Lumbar Exercises: Sidelying   Clam Both;10 reps    Clam Limitations red theraband   holding table to assist in maintaining pelvic stability   Other Sidelying Lumbar Exercises B open books with HBH x 15 B, 10 with arm outstretched                  PT Education - 03/08/21 1647    Education Details continued education on core as powerhouse and initiator of movement, firing TrA before transfers to prevent back pain, addition to  HEP    Person(s) Educated Patient    Methods Explanation;Demonstration;Tactile cues;Verbal cues;Handout    Comprehension Verbalized understanding;Returned demonstration;Verbal cues required;Tactile cues required            PT Short Term Goals - 03/08/21 1544      PT SHORT TERM GOAL #1   Title Pt will be compliant and knowledgeable with 90% of HEP in order to improve carryover between sessions    Baseline initial HEP given    Time 3    Period Weeks    Status Achieved    Target Date 03/08/21      PT SHORT TERM GOAL #2   Title Pt will decrease 5xSTS time to no greater than 20 seconds in order to improve safety and functional mobility    Baseline 17.73 seconds    Time 3    Period Weeks    Status Achieved    Target Date 03/08/21             PT Long Term Goals - 03/08/21 1545      PT LONG TERM GOAL #1   Title Pt will improve FOTO score to at least 64% in order to improve confidence and functional ability    Baseline 61% initial    Time 10    Period Weeks    Status New      PT LONG TERM GOAL #2   Title Pt will be able to stand comfortably for 1 hour in order to improve activity tolerance and increase activity level    Baseline 30 minutes    Time 10    Period Weeks    Status New      PT LONG TERM GOAL #3   Title Increase LE strength grossly 1 MMT grade to improve ability for stair navigation and chores at home    Baseline see flowsheet    Time 10    Period Weeks    Status New      PT LONG TERM GOAL #4   Title Pt will self report LBP no greater than 3/10 at worst in order to improve comfort and functional ability    Baseline 10/10 at worst    Time 10    Period Weeks    Status New      PT LONG TERM GOAL #5   Title Pt will decrease 5XSTS  time to 12 seconds or less, in order to demonstrate increased LE power production and core coordination for no back pain.    Baseline 17.73s today (visit 3)    Time 6    Period Weeks    Status New    Target Date 04/26/21                  Plan - 03/08/21 1539    Clinical Impression Statement Pt presents with no pain today and considerable ease in transfers noted. She met both STG #1 and #2, reducing 5xSTS time from 41 seconds at initial evaluation to 17.73 seconds today. No provocation of back pain throughout session with exception of one brief instance when pt was repositioning to turn from R to L sidelying, but pain quickly abolished with reminder to fire TrA. Initiated hip stretching in supine with some c/o pain into hip ER, none with piriformis KTOS. Added these stretches to HEP, as well as resisted clamshells, and advised pt to be gentle and apply gradual pressure to hip stretching on R.    Personal Factors and Comorbidities Comorbidity 2    Comorbidities DMII, HTN    Examination-Activity Limitations Sit;Sleep;Squat;Stand;Stairs;Bend    Examination-Participation Restrictions Shop;Yard Work;Community Activity    Stability/Clinical Decision Making Stable/Uncomplicated    Rehab Potential Good    PT Frequency 2x / week    PT Duration Other (comment)   10 weeks   PT Treatment/Interventions ADLs/Self Care Home Management;Electrical Stimulation;Moist Heat;Traction;Gait training;Stair training;Functional mobility training;Therapeutic activities;Therapeutic exercise;Balance training;Neuromuscular re-education;Patient/family education;Manual techniques;Dry needling;Spinal Manipulations;Joint Manipulations    PT Next Visit Plan Assess response to HEP/update PRN, manual PRN to address LBP, progress core stabilization and hip strengthening exercises as able, hip flexibility, try band above knees sit <> stands, could try posterior pelvic tilt again to see if it still aggravates LBP    PT Home Exercise Plan [email or web address]    Consulted and Agree with Plan of Care Patient           Patient will benefit from skilled therapeutic intervention in order to improve the following deficits and impairments:  Abnormal gait,Decreased  activity tolerance,Decreased balance,Decreased range of motion,Decreased endurance,Decreased strength,Difficulty walking,Pain  Visit Diagnosis: Muscle weakness (generalized)  Impaired gait  Impaired functional mobility, balance, and endurance     Problem List Patient Active Problem List   Diagnosis Date Noted  . Chronic bilateral low back pain with right-sided sciatica 01/26/2021  . Chronic right-sided thoracic back pain 01/26/2021  . Coronary artery disease involving native coronary artery of native heart with angina pectoris ([place]) 05/04/2020  . Diabetes mellitus without complication ([place]) 94/76/5465  . Environmental and seasonal allergies 03/03/2020  . Right lumbar radiculopathy 01/19/2019  . Acute pain of right knee 01/19/2019  . Cutaneous abscess of abdominal wall 06/25/2018  . Ganglion cyst of wrist, right 05/09/2017  . Coronary artery disease involving native coronary artery of native heart without angina pectoris   . Abnormal screening cardiac CT 10/16/2016  . Hyperlipidemia 08/03/2016  . Chest pain 08/03/2016  . Onychomycosis of toenail 04/18/2016  . GERD (gastroesophageal reflux disease) 02/27/2016  . Depression 02/27/2016  . Palpitation 03/19/2014  . Dyspnea on exertion 03/19/2014  . Fatigue 03/19/2014  . Essential hypertension 07/22/2013  . Obesity 07/22/2013  . DM type 2, controlled, with complication ([place]) 03/54/6568  . Allergic rhinitis 07/22/2013  . Stroke [facility]) [place], PT, DPT 03/08/2021, 5:20 PM  [place],  [place], [address] Phone: [phone]   Fax:  [phone]  Name: Haley [name] MRN: [id number] Date of Birth: 08-16-1956

## 2021-03-10 ENCOUNTER — Other Ambulatory Visit: Payer: Self-pay

## 2021-03-10 ENCOUNTER — Other Ambulatory Visit: Payer: Self-pay | Admitting: Internal Medicine

## 2021-03-10 ENCOUNTER — Ambulatory Visit: Payer: Medicare Other

## 2021-03-10 DIAGNOSIS — Z1231 Encounter for screening mammogram for malignant neoplasm of breast: Secondary | ICD-10-CM

## 2021-03-10 DIAGNOSIS — Z7409 Other reduced mobility: Secondary | ICD-10-CM

## 2021-03-10 DIAGNOSIS — M6281 Muscle weakness (generalized): Secondary | ICD-10-CM | POA: Diagnosis not present

## 2021-03-10 DIAGNOSIS — R269 Unspecified abnormalities of gait and mobility: Secondary | ICD-10-CM

## 2021-03-10 NOTE — Therapy (Signed)
[facility] [address] [place], [place], [address] Phone: [phone]   Fax:  [phone]  Physical Therapy Treatment  Patient Details  Name: Haley [name] MRN: [id number] Date of Birth: [birth year] Referring Provider (PT): Julieanne Manson, MD   Encounter Date: 03/10/2021   PT End of Session - 03/10/21 1454    Visit Number 4    Number of Visits 21    Date for PT Re-Evaluation 04/29/21    Authorization Type UHC MCR/MCD    Progress Note Due on Visit 10    PT Start Time 1452    PT Stop Time 1528    PT Time Calculation (min) 36 min    Activity Tolerance Patient limited by pain;Patient tolerated treatment well    Behavior During Therapy [facility] for tasks assessed/performed           Past Medical History:  Diagnosis Date  . Back pain   . Depression   . DM type 2, controlled, with complication (HCC) 07/22/2013   Type II DM diagnosed in ~ 2006 per pt hx.   . Environmental and seasonal allergies   . Ganglion cyst of wrist, right 05/09/2017  . Hyperlipidemia   . Hypertension   . Itching   . Shingles   . Stroke [facility]) 2000   per pt. report:   TIA    Past Surgical History:  Procedure Laterality Date  . ABDOMINAL HYSTERECTOMY  1994   Laparoscopic; ovaries still intact.  Performed for prolapsed uterus.  Marland Kitchen CARDIAC CATHETERIZATION N/A 10/23/2016   Procedure: Left Heart Cath and Coronary Angiography;  Surgeon: Kathleene Hazel, MD;  Location: [facility] INVASIVE CV LAB;  Service: Cardiovascular;  Laterality: N/A;  . CHOLECYSTECTOMY  1997   Laparoscopic  . COLONOSCOPY  2005-at age 63   Hyperplastic polyp--  . LEFT HEART CATH AND CORONARY ANGIOGRAPHY N/A 05/04/2020   Procedure: LEFT HEART CATH AND CORONARY ANGIOGRAPHY;  Surgeon: Swaziland, Peter M, MD;  Location: [facility] INVASIVE CV LAB;  Service: Cardiovascular;  Laterality: N/A;  . WISDOM TOOTH EXTRACTION      There were no vitals filed for this visit.   Subjective  Assessment - 03/10/21 1455    Subjective Pt presents to PT with reports of slight lower back pain. She has been compliant with her HEP with no adverse effect .Pt is ready to begin PT treatment at this time.    Currently in Pain? Yes    Pain Score 3     Pain Location Back    Pain Orientation Lower                             [facility] Adult PT Treatment/Exercise - 03/10/21 0001      Lumbar Exercises: Aerobic   Nustep L5 LE only x 3 min while taking subjective      Lumbar Exercises: Seated   Sit to Stand 10 reps    Sit to Stand Limitations x 2 w/ 5lb DB    Other Seated Lumbar Exercises seated ball rollouts 2x10 - green swiss ball - pain relieving    Other Seated Lumbar Exercises hip ADD with ball, inhale to relax and exhale to squeeze to activate TrA and pelvic floor x15      Lumbar Exercises: Supine   Pelvic Tilt 5 reps;5 seconds    Other Supine Lumbar Exercises hamstring ball rollout x 10      Lumbar Exercises: Sidelying   Clam  10 reps;Both    Clam Limitations green tband                  PT Education - 03/10/21 1526    Education Details HEP    Person(s) Educated Patient    Methods Explanation;Demonstration    Comprehension Verbalized understanding;Returned demonstration            PT Short Term Goals - 03/08/21 1544      PT SHORT TERM GOAL #1   Title Pt will be compliant and knowledgeable with 90% of HEP in order to improve carryover between sessions    Baseline initial HEP given    Time 3    Period Weeks    Status Achieved    Target Date 03/08/21      PT SHORT TERM GOAL #2   Title Pt will decrease 5xSTS time to no greater than 20 seconds in order to improve safety and functional mobility    Baseline 17.73 seconds    Time 3    Period Weeks    Status Achieved    Target Date 03/08/21             PT Long Term Goals - 03/08/21 1545      PT LONG TERM GOAL #1   Title Pt will improve FOTO score to at least 64% in order to improve  confidence and functional ability    Baseline 61% initial    Time 10    Period Weeks    Status New      PT LONG TERM GOAL #2   Title Pt will be able to stand comfortably for 1 hour in order to improve activity tolerance and increase activity level    Baseline 30 minutes    Time 10    Period Weeks    Status New      PT LONG TERM GOAL #3   Title Increase LE strength grossly 1 MMT grade to improve ability for stair navigation and chores at home    Baseline see flowsheet    Time 10    Period Weeks    Status New      PT LONG TERM GOAL #4   Title Pt will self report LBP no greater than 3/10 at worst in order to improve comfort and functional ability    Baseline 10/10 at worst    Time 10    Period Weeks    Status New      PT LONG TERM GOAL #5   Title Pt will decrease 5XSTS time to 12 seconds or less, in order to demonstrate increased LE power production and core coordination for no back pain.    Baseline 17.73s today (visit 3)    Time 6    Period Weeks    Status New    Target Date 04/26/21                 Plan - 03/10/21 1519    Clinical Impression Statement Pt was able to complete all prescribed exercises with no adverse effect or increase in pian. She demonstrated flexion preference, with decreased pain after repeated flexion in sitting position. Pt is progressing very well with therapy thus far, showing improving strength, transfer ability, and activity tolerance. PT will continue to progress as tolerated per POC as prescribed.    Personal Factors and Comorbidities Comorbidity 2    Comorbidities DMII, HTN    Examination-Activity Limitations Sit;Sleep;Squat;Stand;Stairs;Bend    Examination-Participation Restrictions Shop;Yard Work;Community Activity  Stability/Clinical Decision Making Stable/Uncomplicated    Rehab Potential Good    PT Frequency 2x / week    PT Duration Other (comment)   10 weeks   PT Treatment/Interventions ADLs/Self Care Home Management;Electrical  Stimulation;Moist Heat;Traction;Gait training;Stair training;Functional mobility training;Therapeutic activities;Therapeutic exercise;Balance training;Neuromuscular re-education;Patient/family education;Manual techniques;Dry needling;Spinal Manipulations;Joint Manipulations    PT Next Visit Plan Assess response to HEP/update PRN, manual PRN to address LBP, progress core stabilization and hip strengthening exercises as able, hip flexibility, try band above knees sit <> stands, could try posterior pelvic tilt again to see if it still aggravates LBP    PT Home Exercise Plan [email or web address]    Consulted and Agree with Plan of Care Patient           Patient will benefit from skilled therapeutic intervention in order to improve the following deficits and impairments:  Abnormal gait,Decreased activity tolerance,Decreased balance,Decreased range of motion,Decreased endurance,Decreased strength,Difficulty walking,Pain  Visit Diagnosis: Muscle weakness (generalized)  Impaired gait  Impaired functional mobility, balance, and endurance     Problem List Patient Active Problem List   Diagnosis Date Noted  . Chronic bilateral low back pain with right-sided sciatica 01/26/2021  . Chronic right-sided thoracic back pain 01/26/2021  . Coronary artery disease involving native coronary artery of native heart with angina pectoris (HCC) 05/04/2020  . Diabetes mellitus without complication (HCC) 03/03/2020  . Environmental and seasonal allergies 03/03/2020  . Right lumbar radiculopathy 01/19/2019  . Acute pain of right knee 01/19/2019  . Cutaneous abscess of abdominal wall 06/25/2018  . Ganglion cyst of wrist, right 05/09/2017  . Coronary artery disease involving native coronary artery of native heart without angina pectoris   . Abnormal screening cardiac CT 10/16/2016  . Hyperlipidemia 08/03/2016  . Chest pain 08/03/2016  . Onychomycosis of toenail 04/18/2016  . GERD (gastroesophageal reflux disease)  02/27/2016  . Depression 02/27/2016  . Palpitation 03/19/2014  . Dyspnea on exertion 03/19/2014  . Fatigue 03/19/2014  . Essential hypertension 07/22/2013  . Obesity 07/22/2013  . DM type 2, controlled, with complication (HCC) 07/22/2013  . Allergic rhinitis 07/22/2013  . Stroke [facility]) 2000    Eloy End, PT, DPT 03/10/21 3:30 PM  [facility] [address] [place], [place], [address] Phone: [phone]   Fax:  [phone]  Name: YISELL SPRUNGER MRN: [id number] Date of Birth: 09/13/1956

## 2021-03-11 ENCOUNTER — Other Ambulatory Visit: Payer: Self-pay | Admitting: Internal Medicine

## 2021-03-14 ENCOUNTER — Other Ambulatory Visit: Payer: Self-pay

## 2021-03-14 ENCOUNTER — Ambulatory Visit: Payer: Medicare Other

## 2021-03-14 DIAGNOSIS — Z7409 Other reduced mobility: Secondary | ICD-10-CM

## 2021-03-14 DIAGNOSIS — M6281 Muscle weakness (generalized): Secondary | ICD-10-CM | POA: Diagnosis not present

## 2021-03-14 DIAGNOSIS — R269 Unspecified abnormalities of gait and mobility: Secondary | ICD-10-CM

## 2021-03-14 NOTE — Therapy (Signed)
[facility] [address] [place], [place], [address] Phone: [phone]   Fax:  [phone]  Physical Therapy Treatment  Patient Details  Name: Haley [name] MRN: [id number] Date of Birth: Sep 11, 1956 Referring Provider (PT): Julieanne Manson, MD   Encounter Date: 03/14/2021   PT End of Session - 03/14/21 1411    Visit Number 5    Number of Visits 21    Date for PT Re-Evaluation 04/29/21    Authorization Type UHC MCR/MCD    Progress Note Due on Visit 10    PT Start Time 1409   pt arrived late   PT Stop Time 1448    PT Time Calculation (min) 39 min    Activity Tolerance Patient limited by pain;Patient tolerated treatment well    Behavior During Therapy [facility] for tasks assessed/performed           Past Medical History:  Diagnosis Date  . Back pain   . Depression   . DM type 2, controlled, with complication (HCC) 07/22/2013   Type II DM diagnosed in ~ 2006 per pt hx.   . Environmental and seasonal allergies   . Ganglion cyst of wrist, right 05/09/2017  . Hyperlipidemia   . Hypertension   . Itching   . Shingles   . Stroke [facility]) 2000   per pt. report:   TIA    Past Surgical History:  Procedure Laterality Date  . ABDOMINAL HYSTERECTOMY  1994   Laparoscopic; ovaries still intact.  Performed for prolapsed uterus.  Marland Kitchen CARDIAC CATHETERIZATION N/A 10/23/2016   Procedure: Left Heart Cath and Coronary Angiography;  Surgeon: Kathleene Hazel, MD;  Location: [facility] INVASIVE CV LAB;  Service: Cardiovascular;  Laterality: N/A;  . CHOLECYSTECTOMY  1997   Laparoscopic  . COLONOSCOPY  2005-at age 54   Hyperplastic polyp--[place]  . LEFT HEART CATH AND CORONARY ANGIOGRAPHY N/A 05/04/2020   Procedure: LEFT HEART CATH AND CORONARY ANGIOGRAPHY;  Surgeon: Swazil[place], Peter M, MD;  Location: [facility] INVASIVE CV LAB;  Service: Cardiovascular;  Laterality: N/A;  . WISDOM TOOTH EXTRACTION      There were no vitals filed for this visit.    Subjective Assessment - 03/14/21 1412    Subjective Pt presents to PT with reports of no current LBP. Pt has been compliant with her HEP with no adverse effect. She is ready to begin PT treatment at this time.    Currently in Pain? No/denies    Pain Score 0-No pain                             [facility] Adult PT Treatment/Exercise - 03/14/21 0001      Lumbar Exercises: Aerobic   Nustep L4 LE only x 3 min while taking subjective      Lumbar Exercises: Seated   Sit to Stand 10 reps    Sit to Stand Limitations w/ 5lb DB    Other Seated Lumbar Exercises seated ball rollouts 2x15 - green swiss ball - pain relieving      Lumbar Exercises: Supine   Pelvic Tilt 5 reps;5 seconds    Bridge Limitations 2x8 - 3 sec hold    Other Supine Lumbar Exercises hamstring ball rollout 2 x 10      Knee/Hip Exercises: Standing   Hip Abduction 2 sets;10 reps;Both    Hip Extension 10 reps;Both    Functional Squat 2 sets;5 reps  PT Short Term Goals - 03/08/21 1544      PT SHORT TERM GOAL #1   Title Pt will be compliant and knowledgeable with 90% of HEP in order to improve carryover between sessions    Baseline initial HEP given    Time 3    Period Weeks    Status Achieved    Target Date 03/08/21      PT SHORT TERM GOAL #2   Title Pt will decrease 5xSTS time to no greater than 20 seconds in order to improve safety and functional mobility    Baseline 17.73 seconds    Time 3    Period Weeks    Status Achieved    Target Date 03/08/21             PT Long Term Goals - 03/08/21 1545      PT LONG TERM GOAL #1   Title Pt will improve FOTO score to at least 64% in order to improve confidence and functional ability    Baseline 61% initial    Time 10    Period Weeks    Status New      PT LONG TERM GOAL #2   Title Pt will be able to stand comfortably for 1 hour in order to improve activity tolerance and increase activity level    Baseline 30 minutes     Time 10    Period Weeks    Status New      PT LONG TERM GOAL #3   Title Increase LE strength grossly 1 MMT grade to improve ability for stair navigation and chores at home    Baseline see flowsheet    Time 10    Period Weeks    Status New      PT LONG TERM GOAL #4   Title Pt will self report LBP no greater than 3/10 at worst in order to improve comfort and functional ability    Baseline 10/10 at worst    Time 10    Period Weeks    Status New      PT LONG TERM GOAL #5   Title Pt will decrease 5XSTS time to 12 seconds or less, in order to demonstrate increased LE power production and core coordination for no back pain.    Baseline 17.73s today (visit 3)    Time 6    Period Weeks    Status New    Target Date 04/26/21                 Plan - 03/14/21 1451    Clinical Impression Statement Pt was once again able to complete prescribed exercises and was able to progress to standing strengthening exercises with no adverse effect. She continues to demo flexion preference, as repeated spinal flexion greatly decreases onset of low back discomfort during exercise. Overall pt is progressing well and shows improving activity tolerance. Will continue to progress exercises as able per POC as prescribed.    Personal Factors and Comorbidities Comorbidity 2    Comorbidities DMII, HTN    Examination-Activity Limitations Sit;Sleep;Squat;Stand;Stairs;Bend    Examination-Participation Restrictions Shop;Yard Work;Community Activity    Stability/Clinical Decision Making Stable/Uncomplicated    Rehab Potential Good    PT Frequency 2x / week    PT Duration Other (comment)   10 weeks   PT Treatment/Interventions ADLs/Self Care Home Management;Electrical Stimulation;Moist Heat;Traction;Gait training;Stair training;Functional mobility training;Therapeutic activities;Therapeutic exercise;Balance training;Neuromuscular re-education;Patient/family education;Manual techniques;Dry needling;Spinal  Manipulations;Joint Manipulations    PT Next Visit  Plan assess response to last visit and progress standing exercises as tolerated    PT Home Exercise Plan [email or web address]    Consulted and Agree with Plan of Care Patient           Patient will benefit from skilled therapeutic intervention in order to improve the following deficits and impairments:  Abnormal gait,Decreased activity tolerance,Decreased balance,Decreased range of motion,Decreased endurance,Decreased strength,Difficulty walking,Pain  Visit Diagnosis: Muscle weakness (generalized)  Impaired gait  Impaired functional mobility, balance, and endurance     Problem List Patient Active Problem List   Diagnosis Date Noted  . Chronic bilateral low back pain with right-sided sciatica 01/26/2021  . Chronic right-sided thoracic back pain 01/26/2021  . Coronary artery disease involving native coronary artery of native heart with angina pectoris (HCC) 05/04/2020  . Diabetes mellitus without complication (HCC) 03/03/2020  . Environmental and seasonal allergies 03/03/2020  . Right lumbar radiculopathy 01/19/2019  . Acute pain of right knee 01/19/2019  . Cutaneous abscess of abdominal wall 06/25/2018  . Ganglion cyst of wrist, right 05/09/2017  . Coronary artery disease involving native coronary artery of native heart without angina pectoris   . Abnormal screening cardiac CT 10/16/2016  . Hyperlipidemia 08/03/2016  . Chest pain 08/03/2016  . Onychomycosis of toenail 04/18/2016  . GERD (gastroesophageal reflux disease) 02/27/2016  . Depression 02/27/2016  . Palpitation 03/19/2014  . Dyspnea on exertion 03/19/2014  . Fatigue 03/19/2014  . Essential hypertension 07/22/2013  . Obesity 07/22/2013  . DM type 2, controlled, with complication (HCC) 07/22/2013  . Allergic rhinitis 07/22/2013  . Stroke [facility]) 2000    Eloy End, PT, DPT 03/14/21 2:53 PM  [facility] [address] [place], [place], [address] Phone: [phone]   Fax:  [phone]  Name: Haley [name] MRN: [id number] Date of Birth: Feb 11, 1956

## 2021-03-16 ENCOUNTER — Ambulatory Visit: Payer: Medicare Other

## 2021-03-17 ENCOUNTER — Ambulatory Visit: Payer: Medicare Other

## 2021-03-17 ENCOUNTER — Other Ambulatory Visit: Payer: Self-pay

## 2021-03-17 DIAGNOSIS — M6281 Muscle weakness (generalized): Secondary | ICD-10-CM

## 2021-03-17 DIAGNOSIS — Z7409 Other reduced mobility: Secondary | ICD-10-CM

## 2021-03-17 DIAGNOSIS — R269 Unspecified abnormalities of gait and mobility: Secondary | ICD-10-CM

## 2021-03-17 NOTE — Therapy (Signed)
[facility] [address] [place], [place], [address] Phone: [phone]   Fax:  [phone]  Physical Therapy Treatment  Patient Details  Name: Haley [name] MRN: [id number] Date of Birth: [birth year] Referring Provider (PT): Julieanne Manson, MD   Encounter Date: 03/17/2021   PT End of Session - 03/17/21 1627    Visit Number 6    Number of Visits 21    Date for PT Re-Evaluation 04/29/21    Authorization Type UHC MCR/MCD    Progress Note Due on Visit 10    PT Start Time 1530    PT Stop Time 1620    PT Time Calculation (min) 50 min    Activity Tolerance Patient tolerated treatment well    Behavior During Therapy [facility] for tasks assessed/performed           Past Medical History:  Diagnosis Date  . Back pain   . Depression   . DM type 2, controlled, with complication (HCC) 07/22/2013   Type II DM diagnosed in ~ 2006 per pt hx.   . Environmental and seasonal allergies   . Ganglion cyst of wrist, right 05/09/2017  . Hyperlipidemia   . Hypertension   . Itching   . Shingles   . Stroke [facility]) 2000   per pt. report:   TIA    Past Surgical History:  Procedure Laterality Date  . ABDOMINAL HYSTERECTOMY  1994   Laparoscopic; ovaries still intact.  Performed for prolapsed uterus.  Marland Kitchen CARDIAC CATHETERIZATION N/A 10/23/2016   Procedure: Left Heart Cath and Coronary Angiography;  Surgeon: Kathleene Hazel, MD;  Location: [facility] INVASIVE CV LAB;  Service: Cardiovascular;  Laterality: N/A;  . CHOLECYSTECTOMY  1997   Laparoscopic  . COLONOSCOPY  2005-at age 78   Hyperplastic polyp--[place]  . LEFT HEART CATH AND CORONARY ANGIOGRAPHY N/A 05/04/2020   Procedure: LEFT HEART CATH AND CORONARY ANGIOGRAPHY;  Surgeon: Swaziland, Peter M, MD;  Location: [facility] INVASIVE CV LAB;  Service: Cardiovascular;  Laterality: N/A;  . WISDOM TOOTH EXTRACTION      There were no vitals filed for this visit.   Subjective Assessment - 03/17/21 1539     Subjective "I'm out of sorts today, just kind of blue. I almost didn't come today". She has pain today, likely from rearranging her house "pushing, pulling, and tugging".    Currently in Pain? Yes    Pain Score 5     Pain Location Back    Pain Orientation Lower;Right    Pain Descriptors / Indicators Sore              [facility] PT Assessment - 03/17/21 0001      Observation/Other Assessments   Focus on Therapeutic Outcomes (FOTO)  72% ability                         [facility] Adult PT Treatment/Exercise - 03/17/21 0001      Self-Care   Self-Care Other Self-Care Comments    Other Self-Care Comments  see pt edu      Therapeutic Activites    Therapeutic Activities Other Therapeutic Activities    Other Therapeutic Activities Floor <> mat transfers (see assessment), fwd/bkwd weighted walking to simulated pushing/pulling furniture at home, partial lunge holds for proper form and strength to help with floor transfers and lifting      Lumbar Exercises: Stretches   Prone on Elbows Stretch Limitations in this position on floor during mat transfer (  reported R-sided LBP)   abolished pain with 2 sets of PA grade 4 sustained pressure to R PSIS     Lumbar Exercises: Aerobic   Nustep L6 LE only x 5 min      Lumbar Exercises: Standing   Other Standing Lumbar Exercises Iso reverse lunge x10" each with opp UE support on counter    Other Standing Lumbar Exercises FM weighted fwd/bkwd walking x3 ea with 10# (handles at level 6)   VCs and visual cues for mini squat and trunk for balance and optimal core firing     Lumbar Exercises: Seated   Sit to Stand 10 reps    Sit to Stand Limitations w/ 5lb DB      Lumbar Exercises: Supine   Pelvic Tilt 5 reps;5 seconds    Bridge with [employer]sonBall Squeeze Limitations 2x8 - 3 sec hold      Lumbar Exercises: Sidelying   Other Sidelying Lumbar Exercises partial Pilates banana (legs lift together with exhale), using hand to stabilize)      Manual Therapy    Manual therapy comments R PA pressure to PSIS for lengthening of R hip flexors                  PT Education - 03/17/21 1634    Education Details Pt educated on floor <> mat transfers to help with getting on the floor at home, to only perform stretches as needed now and focus more on strength, added iso partial lunge to HEP    Person(s) Educated Patient    Methods Explanation;Demonstration;Tactile cues;Verbal cues;Handout    Comprehension Verbalized understanding;Returned demonstration;Verbal cues required;Tactile cues required            PT Short Term Goals - 03/08/21 1544      PT SHORT TERM GOAL #1   Title Pt will be compliant and knowledgeable with 90% of HEP in order to improve carryover between sessions    Baseline initial HEP given    Time 3    Period Weeks    Status Achieved    Target Date 03/08/21      PT SHORT TERM GOAL #2   Title Pt will decrease 5xSTS time to no greater than 20 seconds in order to improve safety and functional mobility    Baseline 17.73 seconds    Time 3    Period Weeks    Status Achieved    Target Date 03/08/21             PT Long Term Goals - 03/08/21 1545      PT LONG TERM GOAL #1   Title Pt will improve FOTO score to at least 64% in order to improve confidence and functional ability    Baseline 61% initial    Time 10    Period Weeks    Status New      PT LONG TERM GOAL #2   Title Pt will be able to stand comfortably for 1 hour in order to improve activity tolerance and increase activity level    Baseline 30 minutes    Time 10    Period Weeks    Status New      PT LONG TERM GOAL #3   Title Increase LE strength grossly 1 MMT grade to improve ability for stair navigation and chores at home    Baseline see flowsheet    Time 10    Period Weeks    Status New      PT LONG  TERM GOAL #4   Title Pt will self report LBP no greater than 3/10 at worst in order to improve comfort and functional ability    Baseline 10/10 at  worst    Time 10    Period Weeks    Status New      PT LONG TERM GOAL #5   Title Pt will decrease 5XSTS time to 12 seconds or less, in order to demonstrate increased LE power production and core coordination for no back pain.    Baseline 17.73s today (visit 3)    Time 6    Period Weeks    Status New    Target Date 04/26/21                 Plan - 03/17/21 1627    Clinical Impression Statement Pt presents with 5-6/10 low back soreness after moving and rearrranging furniture at home. She reports she was using her back to do so, and wasn't thinking about core engagement. Pt asks to work on getting on/off floor to do her exercises on the floor + a mat at home versus soft bed. Pt successfully performed floor <> sit transfer, demonstrating ability to position herself in prone, sidelying, supine and back to prone before transitioning to QP then 1/2 kneel using low mat table support with R LE fwd in low lunge so complete floor to mat transfer. Pt did c/o R-sided low back pain in prone prop up position that was abolished with PA pressure to posterior ilium/pelvic crest to lengthen R hip flexors and reduce stress on low back. Pt stated she felt successful in doing that and couldn't have done that before PT. Pt's FOTO score improved from 61% ability at intake to 72% today, despite not feeling as well today. Added standing iso lunges and fwd/bkwd weighted walks with free motion machine to simulate pushing/pushing furniture with pt reporting a lot of core work throughout and no low back pain. Will continue to progress core stab and lower extremity strengthening for overall improved functional mobility without low back pain.    Personal Factors and Comorbidities Comorbidity 2    Comorbidities DMII, HTN    Examination-Activity Limitations Sit;Sleep;Squat;Stand;Stairs;Bend    Examination-Participation Restrictions Shop;Yard Work;Community Activity    Stability/Clinical Decision Making Stable/Uncomplicated     Rehab Potential Good    PT Frequency 2x / week    PT Duration Other (comment)   10 weeks   PT Treatment/Interventions ADLs/Self Care Home Management;Electrical Stimulation;Moist Heat;Traction;Gait training;Stair training;Functional mobility training;Therapeutic activities;Therapeutic exercise;Balance training;Neuromuscular re-education;Patient/family education;Manual techniques;Dry needling;Spinal Manipulations;Joint Manipulations    PT Next Visit Plan Assess floor transfers (how it went at home), standing exercises for LE and core strength/stab as tolerated, continued core stabilization on mat and in standing    PT Home Exercise Plan [email or web address]    Consulted and Agree with Plan of Care Patient           Patient will benefit from skilled therapeutic intervention in order to improve the following deficits and impairments:  Abnormal gait,Decreased activity tolerance,Decreased balance,Decreased range of motion,Decreased endurance,Decreased strength,Difficulty walking,Pain  Visit Diagnosis: Muscle weakness (generalized)  Impaired gait  Impaired functional mobility, balance, and endurance     Problem List Patient Active Problem List   Diagnosis Date Noted  . Chronic bilateral low back pain with right-sided sciatica 01/26/2021  . Chronic right-sided thoracic back pain 01/26/2021  . Coronary artery disease involving native coronary artery of native heart with angina pectoris (HCC) 05/04/2020  . Diabetes mellitus without  complication (HCC) 03/03/2020  . Environmental and seasonal allergies 03/03/2020  . Right lumbar radiculopathy 01/19/2019  . Acute pain of right knee 01/19/2019  . Cutaneous abscess of abdominal wall 06/25/2018  . Ganglion cyst of wrist, right 05/09/2017  . Coronary artery disease involving native coronary artery of native heart without angina pectoris   . Abnormal screening cardiac CT 10/16/2016  . Hyperlipidemia 08/03/2016  . Chest pain 08/03/2016  . Onychomycosis  of toenail 04/18/2016  . GERD (gastroesophageal reflux disease) 02/27/2016  . Depression 02/27/2016  . Palpitation 03/19/2014  . Dyspnea on exertion 03/19/2014  . Fatigue 03/19/2014  . Essential hypertension 07/22/2013  . Obesity 07/22/2013  . DM type 2, controlled, with complication (HCC) 07/22/2013  . Allergic rhinitis 07/22/2013  . Stroke [facility]) 2000    Marcelline Mates, PT, DPT 03/17/2021, 4:40 PM  [facility] [address] [place], [place], [address] Phone: [phone]   Fax:  [phone]  Name: Haley [name] MRN: [id number] Date of Birth: November 29, 1955

## 2021-03-22 ENCOUNTER — Ambulatory Visit: Payer: Medicare Other

## 2021-03-22 ENCOUNTER — Other Ambulatory Visit: Payer: Self-pay

## 2021-03-22 DIAGNOSIS — R269 Unspecified abnormalities of gait and mobility: Secondary | ICD-10-CM

## 2021-03-22 DIAGNOSIS — M6281 Muscle weakness (generalized): Secondary | ICD-10-CM | POA: Diagnosis not present

## 2021-03-22 DIAGNOSIS — Z7409 Other reduced mobility: Secondary | ICD-10-CM

## 2021-03-22 NOTE — Patient Instructions (Signed)
Access Code: [email or web address] URL: [email or web address] Date: 03/22/2021 Prepared by: Gardiner Rhyme  Exercises Seated Hip Adduction Isometrics with Newman Pies - 1 x daily - 7 x weekly - 3 sets - 10 reps - 5 hold Seated Hip Abduction with Resistance - 1 x daily - 7 x weekly - 3 sets - 10 reps - 3 hold Seated Hamstring Stretch - 1 x daily - 7 x weekly - 1 sets - 3 reps - 30 hold Open Books - 1-2 x daily - 7 x weekly - 1 sets - 10 reps Sidelying Thoracic Rotation with Open Book - 1-2 x daily - 7 x weekly - 1 sets - 10 reps Sit to Stand - 1-2 x daily - 7 x weekly - 1-2 sets - 8-12 reps Clamshell with Resistance - 1 x daily - 7 x weekly - 1 sets - 10 reps Supine Figure 4 Piriformis Stretch - 1-2 x daily - 7 x weekly - 2-3 sets - 20-30 seconds hold Supine Posterior Pelvic Tilt - 1 x daily - 7 x weekly - 2 sets - 10 reps - 5 hold Seated Lumbar Flexion Stretch - 1 x daily - 7 x weekly - 3 sets - 10 reps - 5 hold Supine Piriformis Stretch with Foot on Ground - 1-2 x daily - 7 x weekly - 2-3 sets - 10 reps - 20-30 seconds hold Standing Partial Lunge - 3-4 x weekly - 1 sets - 5 reps - 5 seconds hold Quadruped Diaphragmatic Breathing - 1 x daily - 7 x weekly - 1 sets - 10 reps Childs Pose Knees Apart and Hands Forward - 1 x daily - 7 x weekly - 2-3 sets - 10-20 seconds hold Child's Pose with Sidebending - 1 x daily - 7 x weekly - 2-3 sets - 10 seconds hold

## 2021-03-22 NOTE — Therapy (Signed)
[facility] [address] [place], [place], [address] Phone: [phone]   Fax:  [phone]  Physical Therapy Treatment  Patient Details  Name: Haley [name] MRN: [id number] Date of Birth: 09/14/1956 Referring Provider (PT): Julieanne Manson, MD   Encounter Date: 03/22/2021   PT End of Session - 03/22/21 1409    Visit Number 7    Number of Visits 21    Date for PT Re-Evaluation 04/29/21    Authorization Type UHC MCR/MCD    Progress Note Due on Visit 10    PT Start Time 1331   pt arrived 15 min late   PT Stop Time 1400    PT Time Calculation (min) 29 min    Activity Tolerance Patient tolerated treatment well    Behavior During Therapy [facility] for tasks assessed/performed           Past Medical History:  Diagnosis Date  . Back pain   . Depression   . DM type 2, controlled, with complication (HCC) 07/22/2013   Type II DM diagnosed in ~ 2006 per pt hx.   . Environmental and seasonal allergies   . Ganglion cyst of wrist, right 05/09/2017  . Hyperlipidemia   . Hypertension   . Itching   . Shingles   . Stroke [facility]) 2000   per pt. report:   TIA    Past Surgical History:  Procedure Laterality Date  . ABDOMINAL HYSTERECTOMY  1994   Laparoscopic; ovaries still intact.  Performed for prolapsed uterus.  Marland Kitchen CARDIAC CATHETERIZATION N/A 10/23/2016   Procedure: Left Heart Cath and Coronary Angiography;  Surgeon: Kathleene Hazel, MD;  Location: [facility] INVASIVE CV LAB;  Service: Cardiovascular;  Laterality: N/A;  . CHOLECYSTECTOMY  1997   Laparoscopic  . COLONOSCOPY  2005-at age 1   Hyperplastic polyp--[place]  . LEFT HEART CATH AND CORONARY ANGIOGRAPHY N/A 05/04/2020   Procedure: LEFT HEART CATH AND CORONARY ANGIOGRAPHY;  Surgeon: Swaziland, Peter M, MD;  Location: [facility] INVASIVE CV LAB;  Service: Cardiovascular;  Laterality: N/A;  . WISDOM TOOTH EXTRACTION      There were no vitals filed for this visit.   Subjective  Assessment - 03/22/21 1334    Subjective "My back is feeling OK today, but yesterday it was hurting probably because I laid around on the couch all day".    Currently in Pain? No/denies    Pain Score 0-No pain    Pain Location Back                             [facility] Adult PT Treatment/Exercise - 03/22/21 0001      Self-Care   Self-Care Other Self-Care Comments    Other Self-Care Comments  see pt edu      Therapeutic Activites    Other Therapeutic Activities qupadruped positioning with hands on for optimal spinal alignment, shoulder/scapular positioning, and decreasing pressure on wrists      Lumbar Exercises: Standing   Other Standing Lumbar Exercises Iso reverse lunge x10" each with opp UE support on counter   5# KB     Lumbar Exercises: Seated   Sit to Stand 10 reps   2 sets   Sit to Stand Limitations w/ 5-10lb KB      Lumbar Exercises: Quadruped   Other Quadruped Lumbar Exercises sustained position with airex under knees for even arm/leg length   squeezed edge of mat to decr pressure to wrists,  added to home program via diaphragmatic breathing and TrA activation - fatigued in upper body                 PT Education - 03/22/21 1408    Education Details Try QP positioning at home to get used to weight bearing through arms and challenging core in gravity resisted position    Person(s) Educated Patient    Methods Explanation;Demonstration;Tactile cues;Verbal cues;Handout    Comprehension Verbalized understanding;Tactile cues required;Returned demonstration;Verbal cues required            PT Short Term Goals - 03/08/21 1544      PT SHORT TERM GOAL #1   Title Pt will be compliant and knowledgeable with 90% of HEP in order to improve carryover between sessions    Baseline initial HEP given    Time 3    Period Weeks    Status Achieved    Target Date 03/08/21      PT SHORT TERM GOAL #2   Title Pt will decrease 5xSTS time to no greater than 20  seconds in order to improve safety and functional mobility    Baseline 17.73 seconds    Time 3    Period Weeks    Status Achieved    Target Date 03/08/21             PT Long Term Goals - 03/08/21 1545      PT LONG TERM GOAL #1   Title Pt will improve FOTO score to at least 64% in order to improve confidence and functional ability    Baseline 61% initial    Time 10    Period Weeks    Status New      PT LONG TERM GOAL #2   Title Pt will be able to stand comfortably for 1 hour in order to improve activity tolerance and increase activity level    Baseline 30 minutes    Time 10    Period Weeks    Status New      PT LONG TERM GOAL #3   Title Increase LE strength grossly 1 MMT grade to improve ability for stair navigation and chores at home    Baseline see flowsheet    Time 10    Period Weeks    Status New      PT LONG TERM GOAL #4   Title Pt will self report LBP no greater than 3/10 at worst in order to improve comfort and functional ability    Baseline 10/10 at worst    Time 10    Period Weeks    Status New      PT LONG TERM GOAL #5   Title Pt will decrease 5XSTS time to 12 seconds or less, in order to demonstrate increased LE power production and core coordination for no back pain.    Baseline 17.73s today (visit 3)    Time 6    Period Weeks    Status New    Target Date 04/26/21                 Plan - 03/22/21 1409    Clinical Impression Statement Pt presents with 0/10 pain and tolerated session very well. Incorporated quadruped positioning to challenge weight bearing through upper body and core in gravity resisted position. Pt fatigued in upper body before core, as anticipated. She tolerated increase in weight from 5-10# KB well with sit to stands, reporting tightness in lower belly/core, as well as reverse  iso lunges with 5#.    Personal Factors and Comorbidities Comorbidity 2    Comorbidities DMII, HTN    Examination-Activity Limitations  Sit;Sleep;Squat;Stand;Stairs;Bend    Examination-Participation Restrictions Shop;Yard Work;Community Activity    Stability/Clinical Decision Making Stable/Uncomplicated    Rehab Potential Good    PT Frequency 2x / week    PT Duration Other (comment)   10 weeks   PT Treatment/Interventions ADLs/Self Care Home Management;Electrical Stimulation;Moist Heat;Traction;Gait training;Stair training;Functional mobility training;Therapeutic activities;Therapeutic exercise;Balance training;Neuromuscular re-education;Patient/family education;Manual techniques;Dry needling;Spinal Manipulations;Joint Manipulations    PT Next Visit Plan Standing exercises for LE and core strength/stab as tolerated, continued core stabilization on mat and in standing, QP position, modified plank on knees and forearms    PT Home Exercise Plan [email or web address]    Consulted and Agree with Plan of Care Patient           Patient will benefit from skilled therapeutic intervention in order to improve the following deficits and impairments:  Abnormal gait,Decreased activity tolerance,Decreased balance,Decreased range of motion,Decreased endurance,Decreased strength,Difficulty walking,Pain  Visit Diagnosis: Muscle weakness (generalized)  Impaired gait  Impaired functional mobility, balance, and endurance     Problem List Patient Active Problem List   Diagnosis Date Noted  . Chronic bilateral low back pain with right-sided sciatica 01/26/2021  . Chronic right-sided thoracic back pain 01/26/2021  . Coronary artery disease involving native coronary artery of native heart with angina pectoris (HCC) 05/04/2020  . Diabetes mellitus without complication (HCC) 03/03/2020  . Environmental and seasonal allergies 03/03/2020  . Right lumbar radiculopathy 01/19/2019  . Acute pain of right knee 01/19/2019  . Cutaneous abscess of abdominal wall 06/25/2018  . Ganglion cyst of wrist, right 05/09/2017  . Coronary artery disease involving native  coronary artery of native heart without angina pectoris   . Abnormal screening cardiac CT 10/16/2016  . Hyperlipidemia 08/03/2016  . Chest pain 08/03/2016  . Onychomycosis of toenail 04/18/2016  . GERD (gastroesophageal reflux disease) 02/27/2016  . Depression 02/27/2016  . Palpitation 03/19/2014  . Dyspnea on exertion 03/19/2014  . Fatigue 03/19/2014  . Essential hypertension 07/22/2013  . Obesity 07/22/2013  . DM type 2, controlled, with complication (HCC) 07/22/2013  . Allergic rhinitis 07/22/2013  . Stroke [facility]) 2000    Marcelline Mates, PT, DPT 03/22/2021, 2:14 PM  [facility] [address] [place], [place], [address] Phone: [phone]   Fax:  [phone]  Name: Haley [name] MRN: [id number] Date of Birth: [birth year]

## 2021-03-24 ENCOUNTER — Ambulatory Visit: Payer: Medicare Other

## 2021-03-24 ENCOUNTER — Other Ambulatory Visit: Payer: Self-pay

## 2021-03-24 DIAGNOSIS — M6281 Muscle weakness (generalized): Secondary | ICD-10-CM

## 2021-03-24 DIAGNOSIS — Z7409 Other reduced mobility: Secondary | ICD-10-CM

## 2021-03-24 DIAGNOSIS — R269 Unspecified abnormalities of gait and mobility: Secondary | ICD-10-CM

## 2021-03-24 NOTE — Therapy (Addendum)
[place], [place], [address] Phone: [phone]   Fax:  [phone]  Physical Therapy Treatment/Discharge  Patient Details  Name: Haley [name] MRN: [id number] Date of Birth: September 08, 1956 Referring Provider (PT): Mack Hook, MD   Encounter Date: 03/24/2021   PT End of Session - 03/24/21 1351    Visit Number 8    Number of Visits 21    Date for PT Re-Evaluation 04/29/21    Authorization Type UHC MCR/MCD    Progress Note Due on Visit 10    PT Start Time 1355    PT Stop Time 1436    PT Time Calculation (min) 41 min    Activity Tolerance Patient tolerated treatment well    Behavior During Therapy [facility] for tasks assessed/performed           Past Medical History:  Diagnosis Date  . Back pain   . Depression   . DM type 2, controlled, with complication ([place]) 9/76/7341   Type II DM diagnosed in ~ 2006 per pt hx.   . Environmental and seasonal allergies   . Ganglion cyst of wrist, right 05/09/2017  . Hyperlipidemia   . Hypertension   . Itching   . Shingles   . Stroke [facility]) 2000   per pt. report:   TIA    Past Surgical History:  Procedure Laterality Date  . ABDOMINAL HYSTERECTOMY  1994   Laparoscopic; ovaries still intact.  Performed for prolapsed uterus.  Marland Kitchen CARDIAC CATHETERIZATION N/A 10/23/2016   Procedure: Left Heart Cath and Coronary Angiography;  Surgeon: Burnell Blanks, MD;  Location: [facility];  Service: Cardiovascular;  Laterality: N/A;  . CHOLECYSTECTOMY  1997   Laparoscopic  . COLONOSCOPY  39-at age 9   Hyperplastic polyp--[place]  . LEFT HEART CATH AND CORONARY ANGIOGRAPHY N/A 05/04/2020   Procedure: LEFT HEART CATH AND CORONARY ANGIOGRAPHY;  Surgeon: Martinique, Peter M, MD;  Location: [facility];  Service: Cardiovascular;  Laterality: N/A;  . WISDOM TOOTH EXTRACTION      There were no vitals filed for this visit.   Subjective Assessment -  03/24/21 1351    Subjective Pt presents to PT with reports of continued R sided lower back pain. She has been compliant with her HEP with reports of increased pain with quadruped exercise, otherwise no adverse effect. Pt is ready to begin PT at this time.    Currently in Pain? Yes    Pain Score 6     Pain Location Back    Pain Orientation Right;Lower                             [facility] Adult PT Treatment/Exercise - 03/24/21 0001      Lumbar Exercises: Stretches   Other Lumbar Stretch Exercise seated ball rollouts 2 x 15 green physioball      Lumbar Exercises: Aerobic   Nustep L5 UE/LE x 5 min while taking subjective      Lumbar Exercises: Seated   Sit to Stand 10 reps   x 2     Lumbar Exercises: Supine   Pelvic Tilt 10 reps;5 seconds    Pelvic Tilt Limitations supine PPT w/ ball 2x10 - 5 sec hold    Isometric Hip Flexion Limitations supine PPT w/ march x 10    Other Supine Lumbar Exercises hamstring ball rollout 2 x 10    Other Supine Lumbar Exercises  supine PPT w/ clamshell 2x10 green tband                    PT Short Term Goals - 03/08/21 1544      PT SHORT TERM GOAL #1   Title Pt will be compliant and knowledgeable with 90% of HEP in order to improve carryover between sessions    Baseline initial HEP given    Time 3    Period Weeks    Status Achieved    Target Date 03/08/21      PT SHORT TERM GOAL #2   Title Pt will decrease 5xSTS time to no greater than 20 seconds in order to improve safety and functional mobility    Baseline 17.73 seconds    Time 3    Period Weeks    Status Achieved    Target Date 03/08/21             PT Long Term Goals - 03/08/21 1545      PT LONG TERM GOAL #1   Title Pt will improve FOTO score to at least 64% in order to improve confidence and functional ability    Baseline 61% initial    Time 10    Period Weeks    Status New      PT LONG TERM GOAL #2   Title Pt will be able to stand comfortably for 1 hour  in order to improve activity tolerance and increase activity level    Baseline 30 minutes    Time 10    Period Weeks    Status New      PT LONG TERM GOAL #3   Title Increase LE strength grossly 1 MMT grade to improve ability for stair navigation and chores at home    Baseline see flowsheet    Time 10    Period Weeks    Status New      PT LONG TERM GOAL #4   Title Pt will self report LBP no greater than 3/10 at worst in order to improve comfort and functional ability    Baseline 10/10 at worst    Time 10    Period Weeks    Status New      PT LONG TERM GOAL #5   Title Pt will decrease 5XSTS time to 12 seconds or less, in order to demonstrate increased LE power production and core coordination for no back pain.    Baseline 17.73s today (visit 3)    Time 6    Period Weeks    Status New    Target Date 04/26/21                 Plan - 03/24/21 1437    Clinical Impression Statement Pt tolerated treatment well with decreased pain noted at end of session. Exercise intensity slight decreased secondary to increased pain upon presentation. Quadruped exercise withheld from HEP today d/t reports of sharp increases in pain at home. Pt overall is progressing well and continues to show preference for lumbar flexion and neutral spine strengthening exercises. PT will continue to progress pt as tolerated per POC as prescribed.    Personal Factors and Comorbidities Comorbidity 2    Comorbidities DMII, HTN    Examination-Activity Limitations Sit;Sleep;Squat;Stand;Stairs;Bend    Examination-Participation Restrictions Shop;Yard Work;Community Activity    Stability/Clinical Decision Making Stable/Uncomplicated    Rehab Potential Good    PT Frequency 2x / week    PT Duration Other (comment)   10  weeks   PT Treatment/Interventions ADLs/Self Care Home Management;Electrical Stimulation;Moist Heat;Traction;Gait training;Stair training;Functional mobility training;Therapeutic activities;Therapeutic  exercise;Balance training;Neuromuscular re-education;Patient/family education;Manual techniques;Dry needling;Spinal Manipulations;Joint Manipulations    PT Next Visit Plan Standing exercises for LE and core strength/stab as tolerated, continued core stabilization on mat and in standing    PT Home Exercise Plan [id number]    Consulted and Agree with Plan of Care Patient           Patient will benefit from skilled therapeutic intervention in order to improve the following deficits and impairments:  Abnormal gait,Decreased activity tolerance,Decreased balance,Decreased range of motion,Decreased endurance,Decreased strength,Difficulty walking,Pain  Visit Diagnosis: Muscle weakness (generalized)  Impaired gait  Impaired functional mobility, balance, and endurance     Problem List Patient Active Problem List   Diagnosis Date Noted  . Chronic bilateral low back pain with right-sided sciatica 01/26/2021  . Chronic right-sided thoracic back pain 01/26/2021  . Coronary artery disease involving native coronary artery of native heart with angina pectoris ([place]) 05/04/2020  . Diabetes mellitus without complication ([place]) 43/32/9518  . Environmental and seasonal allergies 03/03/2020  . Right lumbar radiculopathy 01/19/2019  . Acute pain of right knee 01/19/2019  . Cutaneous abscess of abdominal wall 06/25/2018  . Ganglion cyst of wrist, right 05/09/2017  . Coronary artery disease involving native coronary artery of native heart without angina pectoris   . Abnormal screening cardiac CT 10/16/2016  . Hyperlipidemia 08/03/2016  . Chest pain 08/03/2016  . Onychomycosis of toenail 04/18/2016  . GERD (gastroesophageal reflux disease) 02/27/2016  . Depression 02/27/2016  . Palpitation 03/19/2014  . Dyspnea on exertion 03/19/2014  . Fatigue 03/19/2014  . Essential hypertension 07/22/2013  . Obesity 07/22/2013  . DM type 2, controlled, with complication ([place]) 84/16/6063  . Allergic rhinitis  07/22/2013  . Stroke [facility]) 2000    Ward Chatters, PT, DPT 03/24/21 2:40 PM  [facility] [address] 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    Remaining deficits: Unknown   Education / Equipment: HEP, theraband  Plan: Patient agrees to discharge.  Patient goals were partially met. Patient is being discharged due to not returning since the last visit.  ?????         Phill Myron. Yvette Rack, PT, DPT

## 2021-03-28 ENCOUNTER — Encounter: Payer: Medicare Other | Admitting: Internal Medicine

## 2021-03-28 ENCOUNTER — Other Ambulatory Visit (INDEPENDENT_AMBULATORY_CARE_PROVIDER_SITE_OTHER): Payer: Medicare Other | Admitting: Internal Medicine

## 2021-03-28 ENCOUNTER — Telehealth: Payer: Self-pay | Admitting: Clinical

## 2021-03-28 ENCOUNTER — Other Ambulatory Visit: Payer: Self-pay

## 2021-03-28 DIAGNOSIS — R509 Fever, unspecified: Secondary | ICD-10-CM | POA: Diagnosis not present

## 2021-03-28 DIAGNOSIS — R197 Diarrhea, unspecified: Secondary | ICD-10-CM

## 2021-03-28 DIAGNOSIS — R519 Headache, unspecified: Secondary | ICD-10-CM | POA: Diagnosis not present

## 2021-03-28 LAB — POC COVID19 BINAXNOW: SARS Coronavirus 2 Ag: NEGATIVE

## 2021-03-28 MED ORDER — CIPROFLOXACIN HCL 500 MG PO TABS: ORAL_TABLET | ORAL | 0 refills | Status: DC

## 2021-03-28 NOTE — Progress Notes (Signed)
covid Has had 4 days of fever to 102. + posterior pharyngeal drainage No cough or dyspnea, but some discomfort in sternal area at times.   Headache, particularly on right forehead to parietal/temporal area.  + abdominal cramping with diarrhea over past 4 days as well, which is more prominent symptom along with fever and headache. No exposures to anyone ill.  Grandkids all healthy.  Daughter without illness. Received Covid booster in March 2022. She stays in her apt mainly. She eats Jif peanut butter daily. Ultimately found out she last ate some 2 days ago and threw away the jar this morning in outside dumpster, so unable to obtain to assess as to whether a part of recall. Currently a Jif recall due to concerns for Salmonella.    COVID antigen testing negative.  NAD Assessed in car with concerns for COVID HEENT:  PERRL, EOMI Neck:  Supple Chest:  CTA CV:  RRR without murmur or rub.  A/P:  Febrile diarrheal illness, perhaps with mild URI symptoms as well, but has limited exposures to others.  COVID negative. Possible exposure to salmonella Cipro 500 mg twice daily for 14 days She will call a progress report tomorrow.

## 2021-03-29 ENCOUNTER — Ambulatory Visit: Payer: Medicare Other

## 2021-03-29 ENCOUNTER — Telehealth: Payer: Self-pay | Admitting: Internal Medicine

## 2021-03-29 NOTE — Telephone Encounter (Signed)
Patient called requesting an advise. Patient stated that she is feeling a bit better with the antibiotics you prescribed, however, she noticed that she has not urinating since yesterday noon. She wants to know if that is something she needs to be concern about or if it is normal due to the diarrhea. Patient stated that she is been drinking water and gatorade constantly. Please advise.

## 2021-03-30 ENCOUNTER — Ambulatory Visit: Payer: Medicare Other | Admitting: Cardiology

## 2021-03-30 NOTE — Telephone Encounter (Signed)
Please call this morning and see if her urine volume has improved.   How many diarrheal stools daily is she having?  Are they large?

## 2021-03-31 ENCOUNTER — Ambulatory Visit: Payer: Medicare Other

## 2021-03-31 NOTE — Telephone Encounter (Signed)
ERROr

## 2021-04-05 ENCOUNTER — Telehealth: Payer: Self-pay

## 2021-04-05 ENCOUNTER — Ambulatory Visit: Payer: Medicare Other

## 2021-04-05 NOTE — Telephone Encounter (Signed)
Called pt and LM VM about missed appointment today at 2pm. Pt was ill last week. Advised of next appt and asked her to call clinic number to discuss future appointments.   Bettey Mare. Corliss Marcus, PT, DPT

## 2021-04-07 ENCOUNTER — Telehealth: Payer: Self-pay

## 2021-04-07 ENCOUNTER — Ambulatory Visit: Payer: Medicare Other | Attending: Internal Medicine

## 2021-04-07 NOTE — Telephone Encounter (Signed)
Spoke with patient and she stated that she still has diarrhea and upset stomach. She is having a about 6/7 stools, no significantly large. Patient also stated that she had low BP last week but is back to normal now.

## 2021-04-07 NOTE — Telephone Encounter (Signed)
Spoke with pt around 2:28 pm after pt didn't show up for 2pm appointment. Called and left pt a message on 04/05/21 after she no showed her 2pm, to confirm her 6/2 at 2pm, and asked her to call back about future appointments. Pt is reportedly still recovering and weak from salmonella from eating JIF peanut butter. Pt agreeable to discharge based on no shows and provider required calls last week to cancel pt's appointments. She was informed to get a new Rx, should she want to return once she is feeling better.   Bettey Mare. Corliss Marcus, PT, DPT

## 2021-04-13 NOTE — Telephone Encounter (Signed)
Called and left message with patient to call and give another progress report as she should have completed her abx.

## 2021-04-14 ENCOUNTER — Ambulatory Visit: Payer: Medicare Other

## 2021-05-03 ENCOUNTER — Ambulatory Visit
Admission: RE | Admit: 2021-05-03 | Discharge: 2021-05-03 | Disposition: A | Discharge status: Home / Self Care | Payer: Medicare Other | Source: Ambulatory Visit | Attending: Internal Medicine | Admitting: Internal Medicine

## 2021-05-03 ENCOUNTER — Other Ambulatory Visit: Payer: Self-pay

## 2021-05-03 DIAGNOSIS — Z1231 Encounter for screening mammogram for malignant neoplasm of breast: Secondary | ICD-10-CM

## 2021-05-04 NOTE — Progress Notes (Signed)
Cardiology Office Note:    Date:  05/06/2021   ID:  [name], DOB [birth year], MRN [id number]  PCP:  Julieanne Manson, MD   [facility] Providers Cardiologist:  Tobias Alexander, MD (Inactive) {   Referring MD: Julieanne Manson, MD     History of Present Illness:    Haley [name] is a 65 y.o. female with a hx of nonobstructive CAD on cath in 2017, DMII, HTN, and HLD who was previously followed by Dr. Delton See who now presents to clinic for follow-up.  Per review of the record, patient underwent coronary CTA in 2017 with calcium score of 239 which was 93 percentile, moderate diffuse CAD possible obstructive.  Cardiac catheterization 10/2016 mild nonobstructive disease in the LAD, moderate stenosis in the mid to distal PDA 50%. LV function normal. Medical therapy recommended.  Saw Dr. Delton See on 04/23/20 with SOB and chest pressure. Underwent repeat LHC on 05/04/20 which showed nonobstructive CAD similar to prior.   During last visit with Jacolyn Reedy on 09/15/20 where she was doing much better and had remained active.  Today, the patient states she overall well. Occasionally misses her imdur and on the days she skips her medication, she has no HA and palpitations. She is not having significant chest pain and is hoping to stop the imdur to side-effects. Blood pressure has been well controlled and mainly 120s at home. No LE edema, DOE, lightheadedness, dizziness. She is walking daily and feels well with exercise.   Past Medical History:  Diagnosis Date   Back pain    Depression    DM type 2, controlled, with complication (HCC) 07/22/2013   Type II DM diagnosed in ~ 2006 per pt hx.    Environmental and seasonal allergies    Ganglion cyst of wrist, right 05/09/2017   Hyperlipidemia    Hypertension    Itching    Shingles    Stroke (HCC) 2000   per pt. report:   TIA    Past Surgical History:  Procedure Laterality Date   ABDOMINAL HYSTERECTOMY  1994   Laparoscopic;  ovaries still intact.  Performed for prolapsed uterus.   CARDIAC CATHETERIZATION N/A 10/23/2016   Procedure: Left Heart Cath and Coronary Angiography;  Surgeon: Kathleene Hazel, MD;  Location: [facility] INVASIVE CV LAB;  Service: Cardiovascular;  Laterality: N/A;   CHOLECYSTECTOMY  1997   Laparoscopic   COLONOSCOPY  2005-at age 63   Hyperplastic polyp--[place]   LEFT HEART CATH AND CORONARY ANGIOGRAPHY N/A 05/04/2020   Procedure: LEFT HEART CATH AND CORONARY ANGIOGRAPHY;  Surgeon: Swaziland, Peter M, MD;  Location: [facility] INVASIVE CV LAB;  Service: Cardiovascular;  Laterality: N/A;   WISDOM TOOTH EXTRACTION      Current Medications: Current Meds  Medication Sig   amLODipine (NORVASC) 5 MG tablet Take 1 tablet (5 mg total) by mouth daily.   aspirin 81 MG tablet Take 81 mg by mouth daily.   cetirizine (ZYRTEC) 10 MG tablet Take 1 tablet (10 mg total) by mouth daily.   Cholecalciferol (VITAMIN D) 2000 units CAPS Take 2,000 Units by mouth daily. 1 daily   ciprofloxacin (CIPRO) 500 MG tablet 1 tab by mouth twice daily for 14 days.   Cyanocobalamin (B-12 PO) Take 1 tablet by mouth daily.   cyclobenzaprine (FLEXERIL) 10 MG tablet 1/2 to 1 tab at bedtime as needed for back pain   GARLIC PO Take 2 capsules by mouth daily. 2 daily   glimepiride (AMARYL) 2 MG tablet TAKE 1 &  1/2 (ONE & ONE-HALF) TABLETS BY MOUTH ONCE DAILY WITH BREAKFAST   ibuprofen (ADVIL) 200 MG tablet 2-4 tabs by mouth twice daily with meals as needed   lisinopril (ZESTRIL) 40 MG tablet Take 1 tablet by mouth once daily   metoprolol succinate (TOPROL-XL) 50 MG 24 hr tablet Take 1 tablet (50 mg total) by mouth daily. Take with or immediately following a meal.   Misc Natural Products (TART CHERRY ADVANCED PO) Take 5 mLs by mouth daily.    mometasone (NASONEX) 50 MCG/ACT nasal spray USE 2 SPRAY(S) IN EACH NOSTRIL ONCE DAILY   Multiple Vitamins-Minerals (MULTIVITAMIN WITH MINERALS) tablet Take 1 tablet by mouth daily.   nitroGLYCERIN  (NITROSTAT) 0.4 MG SL tablet DISSOLVE ONE TABLET UNDER THE TONGUE EVERY 5 MINUTES AS NEEDED FOR CHEST PAIN.  DO NOT EXCEED A TOTAL OF 3 DOSES IN 15 MINUTES   omega-3 acid ethyl esters (LOVAZA) 1 g capsule Take 1 g by mouth 2 (two) times daily.   ranolazine (RANEXA) 500 MG 12 hr tablet Take 1 tablet (500 mg total) by mouth 2 (two) times daily.   rosuvastatin (CRESTOR) 10 MG tablet Take 1 tablet (10 mg total) by mouth daily.   vitamin C (ASCORBIC ACID) 500 MG tablet Take 500 mg by mouth daily.   zinc gluconate 50 MG tablet Take 50 mg by mouth daily.   [DISCONTINUED] isosorbide mononitrate (IMDUR) 30 MG 24 hr tablet Take 0.5 tablets (15 mg total) by mouth daily.     Allergies:   Praluent [alirocumab], Zetia [ezetimibe], Atorvastatin, and Pravastatin   Social History   Socioeconomic History   Marital status: Single    Spouse name: Not on file   Number of children: 2   Years of education: 12+   Highest education level: Some college, no degree  Occupational History   Occupation: Print production planner    Comment: Retired now  Tobacco Use   Smoking status: Never   Smokeless tobacco: Never  Vaping Use   Vaping Use: Never used  Substance and Sexual Activity   Alcohol use: Yes    Alcohol/week: 0.0 standard drinks    Comment: occasionally-1 to 2 drinks monthly    Drug use: No   Sexual activity: Not Currently    Birth control/protection: Surgical  Other Topics Concern   Not on file  Social History Narrative   Did get some post high school training/education   Lives alone   [place] 2 great grandchildren frequently   Daughter lives in [place] and checks in regularly.   Social Determinants of Health   Financial Resource Strain: Low Risk    Difficulty of Paying Living Expenses: Not hard at all  Food Insecurity: No Food Insecurity   Worried About Programme researcher, broadcasting/film/video in the Last Year: Never true   Ran Out of Food in the Last Year: Never true  Transportation Needs: No Transportation Needs    Lack of Transportation (Medical): No   Lack of Transportation (Non-Medical): No  Physical Activity: Not on file  Stress: Not on file  Social Connections: Not on file     Family History: The patient's family history includes Cancer (age of onset: 18) in her sister; Diabetes in her brother, father, sister, and son; Hypertension in her brother and son; Seizures in her brother; Stroke in her maternal aunt.  ROS:   Please see the history of present illness.    Review of Systems  Constitutional:  Negative for chills and fever.  HENT:  Negative for sore  throat.   Eyes:  Negative for blurred vision.  Respiratory:  Negative for shortness of breath.   Cardiovascular:  Positive for palpitations. Negative for chest pain, orthopnea, claudication, leg swelling and PND.  Gastrointestinal:  Negative for nausea and vomiting.  Genitourinary:  Negative for urgency.  Musculoskeletal:  Negative for falls.  Neurological:  Negative for dizziness and loss of consciousness.  Psychiatric/Behavioral:  Negative for substance abuse.     EKGs/Labs/Other Studies Reviewed:    The following studies were reviewed today: 2Decho 05/12/20 IMPRESSIONS   1. Left ventricular ejection fraction, by estimation, is 60 to 65%. The  left ventricle has normal function. The left ventricle has no regional  wall motion abnormalities. There is mild concentric left ventricular  hypertrophy. Left ventricular diastolic  parameters are indeterminate.   2. Right ventricular systolic function is normal. The right ventricular  size is normal. Tricuspid regurgitation signal is inadequate for assessing  PA pressure.   3. The mitral valve is normal in structure. Trivial mitral valve  regurgitation. No evidence of mitral stenosis.   4. The aortic valve is tricuspid. Aortic valve regurgitation is not  visualized. No aortic stenosis is present.   5. The inferior vena cava is normal in size with greater than 50%  respiratory variability,  suggesting right atrial pressure of 3 mmHg.   Comparison(s): No significant change from prior study.   Conclusion(s)/Recommendation(s): Normal biventricular function without  evidence of hemodynamically significant valvular heart disease. Cardiac Cath 05/04/20 Ost LAD to Prox LAD lesion is 30% stenosed. RPDA lesion is 50% stenosed. The left ventricular systolic function is normal. LV end diastolic pressure is mildly elevated. The left ventricular ejection fraction is 55-65% by visual estimate.   1. Nonobstructive CAD. Unchanged from 2017. 2. Normal LV function 3. Mildly elevated LVEDP 20 mm Hg   Plan: medical management.      Coronary CTA IMPRESSION: 1. Coronary calcium score of 239. This was 72 percentile for age and sex matched control.   2. Normal coronary origin with right dominance.   3. 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 We will send additional analysis for CT FFT evaluation once available.   4. Mildly dilated pulmonary artery measuring 31 x 27 mm suspicious for pulmonary hypertension.    EKG:  EKG is ordered today.  The ekg ordered today demonstrates NSR with low voltage throughout, HR 73  Recent Labs: 02/22/2021: ALT 25; BUN 13; Creatinine, Ser 1.05; Hemoglobin 14.4; Platelets 239; Potassium 4.2; Sodium 142  Recent Lipid Panel    Component Value Date/Time   CHOL 148 02/22/2021 0958   TRIG 100 02/22/2021 0958   HDL 45 02/22/2021 0958   LDLCALC 84 02/22/2021 0958   LDLDIRECT 134 (H) 07/22/2013 1620          Physical Exam:    VS:  BP 140/82   Pulse 73   Ht 5\' 2"  (1.575 m)   Wt 227 lb (103 kg)   SpO2 98%   BMI 41.52 kg/m     Wt Readings from Last 3 Encounters:  05/06/21 227 lb (103 kg)  02/22/21 230 lb 12 oz (104.7 kg)  01/25/21 221 lb 8 oz (100.5 kg)     GEN:  Well nourished, well developed in no acute distress HEENT: Normal NECK: No JVD; No carotid bruits CARDIAC: RRR, 1/6 systolic murmur. No rubs, gallops RESPIRATORY:  Clear to  auscultation without rales, wheezing or rhonchi  ABDOMEN: Soft, non-tender, non-distended MUSCULOSKELETAL:  No edema; No deformity  SKIN: Warm and dry  NEUROLOGIC:  Alert and oriented x 3 PSYCHIATRIC:  Normal affect   ASSESSMENT:    1. Coronary artery disease involving native coronary artery of native heart without angina pectoris   2. Essential hypertension   3. Hyperlipidemia, unspecified hyperlipidemia type   4. Diabetes mellitus without complication (HCC)   5. Palpitations    PLAN:    In order of problems listed above:  #Nonobstructive CAD: Patient with moderate, non-obstructive CAD on cath in 2021 similar to that seen in 2017. No current anginal symptoms. Active and walks daily. Hoping to change imdur to ranexa due to HA. -Continue ASA 81mg  daily -Continue lisinopril 40mg  daily -Continue metop 50mg  XL daily -Change imdur to ranexa 500mg  BID due to HA -Continue crestor 10mg  daily  #HTN: Very well controlled at home and at goal <120s/80s. Keeps daily log, which was reviewed today. -Continue lisinopril 40mg  daily -Continue metop 50mg  XL daily -Continue amlodipine 5mg  daily  #HLD: LDL 84 in 02/2021. Goal <70. Will monitor with lifestyle modificaitons -Continue crestor 10mg  daily -Monitor with lifestyle modifications and up-titrate crestor if needed  #Palpitations: -Continue metop 50mg  XL daily  #DMII: -Managed by PCP     Medication Adjustments/Labs and Tests Ordered: Current medicines are reviewed at length with the patient today.  Concerns regarding medicines are outlined above.  Orders Placed This Encounter  Procedures   EKG 12-Lead   Meds ordered this encounter  Medications   ranolazine (RANEXA) 500 MG 12 hr tablet    Sig: Take 1 tablet (500 mg total) by mouth 2 (two) times daily.    Dispense:  180 tablet    Refill:  3    Patient Instructions   Medication Instructions:  Your physician has recommended you make the following change in your medication:   1-STOP Isosorbide Mononitrate   (Imdur) 2-START Ranexa 500 mg by mouth twice daily  *If you need a refill on your cardiac medications before your next appointment, please call your pharmacy*   Lab Work: If you have labs (blood work) drawn today and your tests are completely normal, you will receive your results only by: MyChart Message (if you have MyChart) OR A paper copy in the mail If you have any lab test that is abnormal or we need to change your treatment, we will call you to review the results.  Follow-Up: At [facility], you and your health needs are our priority.  As part of our continuing mission to provide you with exceptional heart care, we have created designated Provider Care Teams.  These Care Teams include your primary Cardiologist (physician) and Advanced Practice Providers (APPs -  Physician Assistants and Nurse Practitioners) who all work together to provide you with the care you need, when you need it.  We recommend signing up for the patient portal called "MyChart".  Sign up information is provided on this After Visit Summary.  MyChart is used to connect with patients for Virtual Visits (Telemedicine).  Patients are able to view lab/test results, encounter notes, upcoming appointments, etc.  Non-urgent messages can be sent to your provider as well.   To learn more about what you can do with MyChart, go to [email or web address].    Your next appointment:   6 month(s)  The format for your next appointment:   In Person  Provider:   You may see Dr. Shari ProwsPemberton or one of the following Advanced Practice Providers on your designated Care Team:   Tereso NewcomerScott Weaver, PA-C Vin CampbellBhagat, PA-C   Signed, Meriam SpragueHeather E Merwin Breden,  MD  05/06/2021 12:26 PM

## 2021-05-06 ENCOUNTER — Encounter (HOSPITAL_BASED_OUTPATIENT_CLINIC_OR_DEPARTMENT_OTHER): Payer: Self-pay | Admitting: Cardiology

## 2021-05-06 ENCOUNTER — Other Ambulatory Visit: Payer: Self-pay

## 2021-05-06 ENCOUNTER — Ambulatory Visit (INDEPENDENT_AMBULATORY_CARE_PROVIDER_SITE_OTHER): Payer: Medicare Other | Admitting: Cardiology

## 2021-05-06 VITALS — BP 140/82 | HR 73 | Ht 62.0 in | Wt 227.0 lb

## 2021-05-06 DIAGNOSIS — I2510 Atherosclerotic heart disease of native coronary artery without angina pectoris: Secondary | ICD-10-CM | POA: Diagnosis not present

## 2021-05-06 DIAGNOSIS — E785 Hyperlipidemia, unspecified: Secondary | ICD-10-CM | POA: Diagnosis not present

## 2021-05-06 DIAGNOSIS — I10 Essential (primary) hypertension: Secondary | ICD-10-CM

## 2021-05-06 DIAGNOSIS — E119 Type 2 diabetes mellitus without complications: Secondary | ICD-10-CM

## 2021-05-06 DIAGNOSIS — R002 Palpitations: Secondary | ICD-10-CM

## 2021-05-06 MED ORDER — RANOLAZINE ER 500 MG PO TB12: 500.0000 mg | ORAL_TABLET | Freq: Two times a day (BID) | ORAL | 3 refills | Status: DC

## 2021-05-06 NOTE — Patient Instructions (Addendum)
  Medication Instructions:  Your physician has recommended you make the following change in your medication:  1-STOP Isosorbide Mononitrate   (Imdur) 2-START Ranexa 500 mg by mouth twice daily  *If you need a refill on your cardiac medications before your next appointment, please call your pharmacy*   Lab Work: If you have labs (blood work) drawn today and your tests are completely normal, you will receive your results only by: MyChart Message (if you have MyChart) OR A paper copy in the mail If you have any lab test that is abnormal or we need to change your treatment, we will call you to review the results.  Follow-Up: At [facility], you and your health needs are our priority.  As part of our continuing mission to provide you with exceptional heart care, we have created designated Provider Care Teams.  These Care Teams include your primary Cardiologist (physician) and Advanced Practice Providers (APPs -  Physician Assistants and Nurse Practitioners) who all work together to provide you with the care you need, when you need it.  We recommend signing up for the patient portal called "MyChart".  Sign up information is provided on this After Visit Summary.  MyChart is used to connect with patients for Virtual Visits (Telemedicine).  Patients are able to view lab/test results, encounter notes, upcoming appointments, etc.  Non-urgent messages can be sent to your provider as well.   To learn more about what you can do with MyChart, go to [email or web address].    Your next appointment:   6 month(s)  The format for your next appointment:   In Person  Provider:   You may see Dr. Shari Prows or one of the following Advanced Practice Providers on your designated Care Team:   Tereso Newcomer, PA-C Vin Hallock, [place]

## 2021-06-30 ENCOUNTER — Encounter: Payer: Self-pay | Admitting: Plastic Surgery

## 2021-06-30 ENCOUNTER — Other Ambulatory Visit: Payer: Self-pay

## 2021-06-30 ENCOUNTER — Ambulatory Visit (INDEPENDENT_AMBULATORY_CARE_PROVIDER_SITE_OTHER): Payer: Medicare Other | Admitting: Plastic Surgery

## 2021-06-30 VITALS — BP 178/100 | HR 86 | Ht 61.0 in | Wt 229.0 lb

## 2021-06-30 DIAGNOSIS — M545 Low back pain, unspecified: Secondary | ICD-10-CM

## 2021-06-30 DIAGNOSIS — M546 Pain in thoracic spine: Secondary | ICD-10-CM

## 2021-06-30 DIAGNOSIS — M4004 Postural kyphosis, thoracic region: Secondary | ICD-10-CM

## 2021-06-30 DIAGNOSIS — N62 Hypertrophy of breast: Secondary | ICD-10-CM | POA: Diagnosis not present

## 2021-06-30 NOTE — Progress Notes (Signed)
Referring Provider Julieanne Manson, MD [address] [place],  [place] [address]   CC:  Chief Complaint  Patient presents with   Advice Only      Haley [name] is an 65 y.o. female.  HPI: Patient presents to discuss breast reduction.  She has had years of back pain, neck pain and shoulder grooving related to her large breast.  She is tried over-the-counter medications, warm packs, cold packs and supportive bras with little relief.  She also gets rashes beneath her breast that been refractory to over-the-counter treatments.  She is currently a G cup and would like to be around a C cup.  She has no family history of breast cancer no previous breast biopsies or procedures.  She is up-to-date on her mammograms and her last one was normal.  She does not smoke.  She is a diabetic but recent hemoglobin A1c is 6.  She has been to a chiropractor for back pain with little sustained relief.  Allergies  Allergen Reactions   Praluent [Alirocumab]     Had significant fatigue and memory loss--was lost driving in town.  Joint pain and nausea as well.   Zetia [Ezetimibe] Other (See Comments)    Joint pain and ear ringing   Atorvastatin Other (See Comments)    Pt states causes bilateral lower extremity muscle cramps   Pravastatin Nausea And Vomiting and Other (See Comments)    Causes dizziness    Outpatient Encounter Medications as of 06/30/2021  Medication Sig   amLODipine (NORVASC) 5 MG tablet Take 1 tablet (5 mg total) by mouth daily.   aspirin 81 MG tablet Take 81 mg by mouth daily.   cetirizine (ZYRTEC) 10 MG tablet Take 1 tablet (10 mg total) by mouth daily.   Cholecalciferol (VITAMIN D) 2000 units CAPS Take 2,000 Units by mouth daily. 1 daily   Cyanocobalamin (B-12 PO) Take 1 tablet by mouth daily.   cyclobenzaprine (FLEXERIL) 10 MG tablet 1/2 to 1 tab at bedtime as needed for back pain   GARLIC PO Take 2 capsules by mouth daily. 2 daily   glimepiride (AMARYL) 2 MG tablet TAKE 1 & 1/2  (ONE & ONE-HALF) TABLETS BY MOUTH ONCE DAILY WITH BREAKFAST   ibuprofen (ADVIL) 200 MG tablet 2-4 tabs by mouth twice daily with meals as needed   lisinopril (ZESTRIL) 40 MG tablet Take 1 tablet by mouth once daily   metoprolol succinate (TOPROL-XL) 50 MG 24 hr tablet Take 1 tablet (50 mg total) by mouth daily. Take with or immediately following a meal.   Misc Natural Products (TART CHERRY ADVANCED PO) Take 5 mLs by mouth daily.    mometasone (NASONEX) 50 MCG/ACT nasal spray USE 2 SPRAY(S) IN EACH NOSTRIL ONCE DAILY   Multiple Vitamins-Minerals (MULTIVITAMIN WITH MINERALS) tablet Take 1 tablet by mouth daily.   nitroGLYCERIN (NITROSTAT) 0.4 MG SL tablet DISSOLVE ONE TABLET UNDER THE TONGUE EVERY 5 MINUTES AS NEEDED FOR CHEST PAIN.  DO NOT EXCEED A TOTAL OF 3 DOSES IN 15 MINUTES   omega-3 acid ethyl esters (LOVAZA) 1 g capsule Take 1 g by mouth 2 (two) times daily.   rosuvastatin (CRESTOR) 10 MG tablet Take 1 tablet (10 mg total) by mouth daily.   vitamin C (ASCORBIC ACID) 500 MG tablet Take 500 mg by mouth daily.   zinc gluconate 50 MG tablet Take 50 mg by mouth daily.   [DISCONTINUED] ciprofloxacin (CIPRO) 500 MG tablet 1 tab by mouth twice daily for 14 days.   [  DISCONTINUED] ranolazine (RANEXA) 500 MG 12 hr tablet Take 1 tablet (500 mg total) by mouth 2 (two) times daily.   No facility-administered encounter medications on file as of 06/30/2021.     Past Medical History:  Diagnosis Date   Back pain    Depression    DM type 2, controlled, with complication (HCC) 07/22/2013   Type II DM diagnosed in ~ 2006 per pt hx.    Environmental and seasonal allergies    Ganglion cyst of wrist, right 05/09/2017   Hyperlipidemia    Hypertension    Itching    Shingles    Stroke (HCC) 2000   per pt. report:   TIA    Past Surgical History:  Procedure Laterality Date   ABDOMINAL HYSTERECTOMY  1994   Laparoscopic; ovaries still intact.  Performed for prolapsed uterus.   CARDIAC CATHETERIZATION N/A  10/23/2016   Procedure: Left Heart Cath and Coronary Angiography;  Surgeon: Kathleene Hazel, MD;  Location[facility] INVASIVE CV LAB;  Service: Cardiovascular;  Laterality: N/A;   CHOLECYSTECTOMY  1997   Laparoscopic   COLONOSCOPY  2005-at age 62   Hyperplastic polyp--[place]   LEFT HEART CATH AND CORONARY ANGIOGRAPHY N/A 05/04/2020   Procedure: LEFT HEART CATH AND CORONARY ANGIOGRAPHY;  Surgeon: Swaziland, Peter M, MD;  Location: [facility] INVASIVE CV LAB;  Service: Cardiovascular;  Laterality: N/A;   WISDOM TOOTH EXTRACTION      Family History  Problem Relation Age of Onset   Diabetes Father    Stroke Maternal Aunt        Multiple aunts died from strokes-maternal   Hypertension Son    Diabetes Son    Hypertension Brother    Diabetes Brother    Diabetes Sister        prediabetic   Cancer Sister 67       uterine cancer   Seizures Brother     Social History   Social History Narrative   Did get some post high school training/education   Lives alone   [place] 2 great grandchildren frequently   Daughter lives in [place] and checks in regularly.     Review of Systems General: Denies fevers, chills, weight loss CV: Denies chest pain, shortness of breath, palpitations  Physical Exam Vitals with BMI 06/30/2021 05/06/2021 02/22/2021  Height 5\' 1"  5\' 2"  5\' 2"   Weight 229 lbs 227 lbs 230 lbs 12 oz  BMI 43.29 41.51 42.19  Systolic 178 140  Diastolic 100 82 90  Pulse 86 73 68    General:  No acute distress,  Alert and oriented, Non-Toxic, Normal speech and affect Breast: She has grade 3 ptosis.  Sternal notch to nipple is 40 cm on the right and 39 cm on the left.  Nipple to fold is 20 cm bilaterally.  No obvious scars or masses.  Assessment/Plan The patient has bilateral symptomatic macromastia.  She is a good candidate for a breast reduction.  She is interested in pursuing surgical treatment.  She has tried supportive garments and fitted bras with no relief.  The details of  breast reduction surgery were discussed.  I explained the procedure in detail along the with the expected scars.  The risks were discussed in detail and include bleeding, infection, damage to surrounding structures, need for additional procedures, nipple loss, change in nipple sensation, persistent pain, contour irregularities and asymmetries.  I explained that breast feeding is often not possible after breast reduction surgery.  We discussed the expected postoperative course  with an overall recovery period of about 1 month.  She demonstrated full understanding of all risks.  We discussed her personal risk factors that include the length of her breast.  Due to the length of her breast and overall size I recommended free nipple graft.  I discussed the downsides of the free nipple graft that include permanent discoloration of the nipple and permanent lack of nipple sensation.  I do think for her there is a good trade off in a better shape could be obtained during the breast reduction in this way.  She is fully understanding and agrees with this thought process..  The patient is interested in pursuing surgical treatment.  I anticipate approximately 800g of tissue removed from each side.   Allena Napoleon 06/30/2021, 11:41 AM

## 2021-07-13 DIAGNOSIS — H938X1 Other specified disorders of right ear: Secondary | ICD-10-CM | POA: Insufficient documentation

## 2021-07-17 DIAGNOSIS — M722 Plantar fascial fibromatosis: Secondary | ICD-10-CM | POA: Insufficient documentation

## 2021-07-28 ENCOUNTER — Encounter: Payer: Self-pay | Admitting: Internal Medicine

## 2021-07-28 ENCOUNTER — Ambulatory Visit (INDEPENDENT_AMBULATORY_CARE_PROVIDER_SITE_OTHER): Payer: Medicare Other | Admitting: Internal Medicine

## 2021-07-28 ENCOUNTER — Other Ambulatory Visit: Payer: Self-pay

## 2021-07-28 VITALS — BP 136/78 | HR 80 | Resp 20 | Ht 61.0 in | Wt 221.0 lb

## 2021-07-28 DIAGNOSIS — Z23 Encounter for immunization: Secondary | ICD-10-CM

## 2021-07-28 DIAGNOSIS — K029 Dental caries, unspecified: Secondary | ICD-10-CM

## 2021-07-28 DIAGNOSIS — J0100 Acute maxillary sinusitis, unspecified: Secondary | ICD-10-CM | POA: Diagnosis not present

## 2021-07-28 DIAGNOSIS — I10 Essential (primary) hypertension: Secondary | ICD-10-CM | POA: Diagnosis not present

## 2021-07-28 MED ORDER — AZITHROMYCIN 500 MG PO TABS: 500.0000 mg | ORAL_TABLET | Freq: Every day | ORAL | 0 refills | Status: DC

## 2021-07-28 NOTE — Patient Instructions (Signed)
Use Nasonex daily Neti pot or just nasal saline prior to Nasonex daily.

## 2021-07-28 NOTE — Progress Notes (Signed)
Subjective:    Patient ID: Haley [name], female   DOB: Oct 27, 1956, 65 y.o.   MRN: [id number]   HPI   Was to have dental procedure today, but BP was too high.  She needs 2 partials made- she needs bone built up in her jaw for the partials to fit.  Also needs cavities addressed.  Has not been necessarily consistent with BP meds and did not take this morning.  She forgets to take her morning meds.    2.  Left eye constantly with clear tearing.  Has some pressure behind her left eye.  Left throat is sore.  Does have posterior pharyngeal drainage.  She is having lots of sneezing with clear nasal discharge.  Has been using Cetirizine regularly.  Has not had a fever.  Drinking herbal tea.  This is a time of year when she has allergies.  She also tends to get sinus infection.  Audiologist is sending her to ENT due to problems in right ear.  Hearing aid will not fit in ear.    Current Meds  Medication Sig   amLODipine (NORVASC) 5 MG tablet Take 1 tablet (5 mg total) by mouth daily.   aspirin 81 MG tablet Take 81 mg by mouth daily.   cetirizine (ZYRTEC) 10 MG tablet Take 1 tablet (10 mg total) by mouth daily.   Cholecalciferol (VITAMIN D) 2000 units CAPS Take 2,000 Units by mouth daily. 1 daily   Cyanocobalamin (B-12 PO) Take 1 tablet by mouth daily.   cyclobenzaprine (FLEXERIL) 10 MG tablet 1/2 to 1 tab at bedtime as needed for back pain   GARLIC PO Take 2 capsules by mouth daily. 2 daily   glimepiride (AMARYL) 2 MG tablet TAKE 1 & 1/2 (ONE & ONE-HALF) TABLETS BY MOUTH ONCE DAILY WITH BREAKFAST   ibuprofen (ADVIL) 200 MG tablet 2-4 tabs by mouth twice daily with meals as needed   lisinopril (ZESTRIL) 40 MG tablet Take 1 tablet by mouth once daily   metoprolol succinate (TOPROL-XL) 50 MG 24 hr tablet Take 1 tablet (50 mg total) by mouth daily. Take with or immediately following a meal.   Misc Natural Products (TART CHERRY ADVANCED PO) Take 5 mLs by mouth daily.    mometasone (NASONEX) 50  MCG/ACT nasal spray USE 2 SPRAY(S) IN EACH NOSTRIL ONCE DAILY   Multiple Vitamins-Minerals (MULTIVITAMIN WITH MINERALS) tablet Take 1 tablet by mouth daily.   nitroGLYCERIN (NITROSTAT) 0.4 MG SL tablet DISSOLVE ONE TABLET UNDER THE TONGUE EVERY 5 MINUTES AS NEEDED FOR CHEST PAIN.  DO NOT EXCEED A TOTAL OF 3 DOSES IN 15 MINUTES   omega-3 acid ethyl esters (LOVAZA) 1 g capsule Take 1 g by mouth 2 (two) times daily.   rosuvastatin (CRESTOR) 10 MG tablet Take 1 tablet (10 mg total) by mouth daily.   vitamin C (ASCORBIC ACID) 500 MG tablet Take 500 mg by mouth daily.   zinc gluconate 50 MG tablet Take 50 mg by mouth daily.   Allergies  Allergen Reactions   Praluent [Alirocumab]     Had significant fatigue and memory loss--was lost driving in town.  Joint pain and nausea as well.   Zetia [Ezetimibe] Other (See Comments)    Joint pain and ear ringing   Atorvastatin Other (See Comments)    Pt states causes bilateral lower extremity muscle cramps   Pravastatin Nausea And Vomiting and Other (See Comments)    Causes dizziness     Review of Systems  Objective:   BP 136/78 (BP Location: Left Arm, Patient Position: Sitting, Cuff Size: Large)   Pulse 80   Resp 20   Ht 5\' 1"  (1.549 m)   Wt 221 lb (100.2 kg)   BMI 41.76 kg/m   Physical Exam NAD HEENT:  PERRL, EOMI, Conjunctivae without injection.  TMs pearly gray.  Nasal mucosa swollen and red on left.  Throat with mild erythema of left tonsillar area.  Unable to see posterior pharynx well.  Tender over left frontal and more so over left maxillary area. Neck:  Supple, No adenopathy Chest:  CTA CV:  RRR with normal S1 and S2, No S3, S4 or murmur.  Radial pulses normal and equal LE:  No edema.   Assessment & Plan    Acute Sinusitis:  Azithromycin 500 mg daily for 5 days.  Recommended Neti pot or nasal saline and then getting back to using Nasonex daily.  Cetirizine daily for now.    2.  HM:  Pfizer bivalent booster today.  3.   Dental:  called her dentist's office and asked what they needed.  Shared she had not taken bp meds this morning or consistently.  She will get back to taking regularly in the morning.  Will find out if can take po the morning of her dental procedures--sounds like she should be able to.    4.  Planning for bilateral breast reduction with Dr. .  She will find out if needs medical clearance for her surgery.  Has an appt on 08/25/21  5.  Hypertension:  urged her to be consistent with medication.  She will set an alarm and have morning pills at bedside with some water.

## 2021-08-02 ENCOUNTER — Telehealth: Payer: Self-pay

## 2021-08-02 NOTE — Telephone Encounter (Signed)
Pt called asking for an RX for yeast infection. Pt forgot to previous mention that she usually gets yeast infections when taking antibiotics

## 2021-08-03 MED ORDER — FLUCONAZOLE 150 MG PO TABS: 150.0000 mg | ORAL_TABLET | Freq: Once | ORAL | 0 refills | Status: AC

## 2021-08-04 NOTE — Telephone Encounter (Signed)
Pt.notified

## 2021-08-15 ENCOUNTER — Encounter: Payer: Self-pay | Admitting: Internal Medicine

## 2021-08-15 ENCOUNTER — Ambulatory Visit (INDEPENDENT_AMBULATORY_CARE_PROVIDER_SITE_OTHER): Payer: Medicare Other | Admitting: Internal Medicine

## 2021-08-15 ENCOUNTER — Other Ambulatory Visit: Payer: Self-pay

## 2021-08-15 VITALS — BP 134/80 | HR 60 | Resp 20 | Ht 61.0 in | Wt 232.0 lb

## 2021-08-15 DIAGNOSIS — K439 Ventral hernia without obstruction or gangrene: Secondary | ICD-10-CM | POA: Diagnosis not present

## 2021-08-15 DIAGNOSIS — I10 Essential (primary) hypertension: Secondary | ICD-10-CM | POA: Diagnosis not present

## 2021-08-15 DIAGNOSIS — E7800 Pure hypercholesterolemia, unspecified: Secondary | ICD-10-CM

## 2021-08-15 DIAGNOSIS — E119 Type 2 diabetes mellitus without complications: Secondary | ICD-10-CM | POA: Diagnosis not present

## 2021-08-15 LAB — GLUCOSE, POCT (MANUAL RESULT ENTRY): POC Glucose: 194 mg/dL — AB (ref 70–99)

## 2021-08-15 MED ORDER — ROSUVASTATIN CALCIUM 10 MG PO TABS: 10.0000 mg | ORAL_TABLET | Freq: Every day | ORAL | 3 refills | Status: DC

## 2021-08-15 MED ORDER — GLIMEPIRIDE 2 MG PO TABS: ORAL_TABLET | ORAL | 3 refills | Status: DC

## 2021-08-15 MED ORDER — OMEGA-3-ACID ETHYL ESTERS 1 G PO CAPS: 1.0000 g | ORAL_CAPSULE | Freq: Two times a day (BID) | ORAL | 3 refills | Status: DC

## 2021-08-15 MED ORDER — MOMETASONE FUROATE 50 MCG/ACT NA SUSP: NASAL | 3 refills | Status: AC

## 2021-08-15 NOTE — Progress Notes (Signed)
Subjective:    Patient ID: Haley [name], female   DOB: 12/17/1955, 65 y.o.   MRN: [id number]   HPI   DM:  Sugars running high today.  Had ice cream last night and biscuit sandwich today.  Sugars have been high more recently as she has been without a stove, so has been eating a lot of cereal and sandwiches.  Stove now fixed after 1.5 weeks.   Eyes checked this year--no diabetic changes.  Dr. Karleen Hampshire no longer seeing adults.  Not sure who is in network--Dr. Dione Booze is not.    2.  Hypertension:  controlled  3.  Hyperlipidemia:  LDL not quite at goal in April.    4.  Ventral Hernia: having more discomfort with time.  Never goes back down.  BMs regular.  Pain with doing things like the dishes as presses against the counter and hurts.  Has surgery for breast reduction soon and would like to wait until heals from that before considering surgery for the hernia  Denies chest pain, dyspnea on exertion, LE edema or PND/orthopnea symptoms.   Planning for breast reduction surgery on the 14th  Current Meds  Medication Sig   amLODipine (NORVASC) 5 MG tablet Take 1 tablet (5 mg total) by mouth daily.   aspirin 81 MG tablet Take 81 mg by mouth daily.   cetirizine (ZYRTEC) 10 MG tablet Take 1 tablet (10 mg total) by mouth daily.   Cholecalciferol (VITAMIN D) 2000 units CAPS Take 2,000 Units by mouth daily. 1 daily   Cyanocobalamin (B-12 PO) Take 1 tablet by mouth daily.   cyclobenzaprine (FLEXERIL) 10 MG tablet 1/2 to 1 tab at bedtime as needed for back pain   GARLIC PO Take 2 capsules by mouth daily. 2 daily   glimepiride (AMARYL) 2 MG tablet TAKE 1 & 1/2 (ONE & ONE-HALF) TABLETS BY MOUTH ONCE DAILY WITH BREAKFAST   ibuprofen (ADVIL) 200 MG tablet 2-4 tabs by mouth twice daily with meals as needed   lisinopril (ZESTRIL) 40 MG tablet Take 1 tablet by mouth once daily   metoprolol succinate (TOPROL-XL) 50 MG 24 hr tablet Take 1 tablet (50 mg total) by mouth daily. Take with or immediately following  a meal.   Misc Natural Products (TART CHERRY ADVANCED PO) Take 5 mLs by mouth daily.    mometasone (NASONEX) 50 MCG/ACT nasal spray USE 2 SPRAY(S) IN EACH NOSTRIL ONCE DAILY   Multiple Vitamins-Minerals (MULTIVITAMIN WITH MINERALS) tablet Take 1 tablet by mouth daily.   nitroGLYCERIN (NITROSTAT) 0.4 MG SL tablet DISSOLVE ONE TABLET UNDER THE TONGUE EVERY 5 MINUTES AS NEEDED FOR CHEST PAIN.  DO NOT EXCEED A TOTAL OF 3 DOSES IN 15 MINUTES   omega-3 acid ethyl esters (LOVAZA) 1 g capsule Take 1 g by mouth 2 (two) times daily.   rosuvastatin (CRESTOR) 10 MG tablet Take 1 tablet (10 mg total) by mouth daily.   vitamin C (ASCORBIC ACID) 500 MG tablet Take 500 mg by mouth daily.   zinc gluconate 50 MG tablet Take 50 mg by mouth daily.   Allergies  Allergen Reactions   Praluent [Alirocumab]     Had significant fatigue and memory loss--was lost driving in town.  Joint pain and nausea as well.   Zetia [Ezetimibe] Other (See Comments)    Joint pain and ear ringing   Atorvastatin Other (See Comments)    Pt states causes bilateral lower extremity muscle cramps   Pravastatin Nausea And Vomiting and Other (See Comments)  Causes dizziness     Review of Systems    Objective:   BP 134/80 (BP Location: Right Arm, Patient Position: Sitting, Cuff Size: Large)   Pulse 60   Resp 20   Ht 5\' 1"  (1.549 m)   Wt 232 lb (105.2 kg)   BMI 43.84 kg/m   Physical Exam NAD HEENT:  PERRL, EOMI Neck:  Supple, no adenopathy, no thyromegaly Chest:  CTA CV: RRR with normal S1 and S2, No S3, S4 or murmur.  No carotid bruits.  Carotid, radial and DP/PT pulses normal and equal Abd:  S, large central ventral hernia that is not reducible and with mild tenderness.  No discoloration.  + BS, No HSM or mass. LE;  No edema.  Diabetic foot exam was performed with the following findings:   No deformities, ulcerations, or other skin breakdown Intact posterior tibialis and dorsalis pedis pulses     Assessment & Plan    DM:  return for A1C/CMP with fasting labs in next couple of weeks.  Has been well controlled.    2.  Hypertension:  controlled.    3.  Hypercholesterolemia with LDL not at goal last check:  FLP with fasting labs.  4.  Back pain with large breast:  planning for bilateral breast reduction on the 14th.  5.  Ventral hernia:  would like to wait until heals from breast surgery before gen surgery referral.  6.  HM:  return in 1 week for influenza vaccine clinic.

## 2021-08-25 ENCOUNTER — Ambulatory Visit: Payer: Medicare Other | Admitting: Internal Medicine

## 2021-08-26 ENCOUNTER — Other Ambulatory Visit: Payer: Self-pay

## 2021-08-26 ENCOUNTER — Other Ambulatory Visit (INDEPENDENT_AMBULATORY_CARE_PROVIDER_SITE_OTHER): Payer: Medicare Other

## 2021-08-26 DIAGNOSIS — Z79899 Other long term (current) drug therapy: Secondary | ICD-10-CM

## 2021-08-26 DIAGNOSIS — E7800 Pure hypercholesterolemia, unspecified: Secondary | ICD-10-CM | POA: Diagnosis not present

## 2021-08-26 DIAGNOSIS — E119 Type 2 diabetes mellitus without complications: Secondary | ICD-10-CM

## 2021-08-27 LAB — COMPREHENSIVE METABOLIC PANEL
ALT: 20 IU/L (ref 0–32)
AST: 16 IU/L (ref 0–40)
Albumin/Globulin Ratio: 1.3 (ref 1.2–2.2)
Albumin: 4 g/dL (ref 3.8–4.8)
Alkaline Phosphatase: 55 IU/L (ref 44–121)
BUN/Creatinine Ratio: 12 (ref 12–28)
BUN: 13 mg/dL (ref 8–27)
Bilirubin Total: 0.3 mg/dL (ref 0.0–1.2)
CO2: 23 mmol/L (ref 20–29)
Calcium: 9.7 mg/dL (ref 8.7–10.3)
Chloride: 105 mmol/L (ref 96–106)
Creatinine, Ser: 1.11 mg/dL — ABNORMAL HIGH (ref 0.57–1.00)
Globulin, Total: 3 g/dL (ref 1.5–4.5)
Glucose: 104 mg/dL — ABNORMAL HIGH (ref 70–99)
Potassium: 3.9 mmol/L (ref 3.5–5.2)
Sodium: 142 mmol/L (ref 134–144)
Total Protein: 7 g/dL (ref 6.0–8.5)
eGFR: 54 mL/min/{1.73_m2} — ABNORMAL LOW (ref >59)

## 2021-08-27 LAB — HEMOGLOBIN A1C
Est. average glucose Bld gHb Est-mCnc: 140 mg/dL
Hgb A1c MFr Bld: 6.5 % — ABNORMAL HIGH (ref 4.8–5.6)

## 2021-08-27 LAB — LIPID PANEL W/O CHOL/HDL RATIO
Cholesterol, Total: 132 mg/dL (ref 100–199)
HDL: 41 mg/dL (ref >39)
LDL Chol Calc (NIH): 68 mg/dL (ref 0–99)
Triglycerides: 132 mg/dL (ref 0–149)
VLDL Cholesterol Cal: 23 mg/dL (ref 5–40)

## 2021-08-30 NOTE — Progress Notes (Signed)
Patient ID: Haley [name], female    DOB: 05/01/1956, 65 y.o.   MRN: [id number]  Chief Complaint  Patient presents with   Pre-op Exam      ICD-10-CM   1. Macromastia  N62     2. Back pain of thoracolumbar region  M54.50    M54.6     3. Postural kyphosis, thoracic region  M40.04        History of Present Illness: Haley [name] is a 65 y.o.  female  with a history of macromastia.  She presents for preoperative evaluation for upcoming procedure, Bilateral Breast Reduction, scheduled for 09/19/2021 with Dr.  Arita Miss  The patient has not had problems with anesthesia. No history of DVT/PE.  No family history of DVT/PE.  No family or personal history of bleeding or clotting disorders.  No history of CVA/MI.  Currently on ASA 81 mg daily  Summary of Previous Visit: Currently a G cup and would like to be around a C cup.  No family or personal history of breast cancer.  She is up-to-date on her mammograms and her last one was normal.  She does not smoke.  She is a diabetic but most recent A1c is 6.  STN is 40 on the right and 39 on the left.  Recommend free nipple graft.  **Patient reports that she would like to be as small as possible, she is comfortable being as small as a B cup.  She is not concerned about being proportional as she is mostly interested in having the weight removed.**  Estimated excess breast tissue to be removed at time of surgery: 800 grams  Job: Retired  PMH Significant for: Hypertension, type 2 diabetes mellitus, hyperlipidemia, history of TIA in 2000. History of nonobstructive coronary artery disease in 2017.  Patient denies any recent changes in her health, denies any shortness of breath at rest or with ambulation or ambulating upstairs.   Past Medical History: Allergies: Allergies  Allergen Reactions   Praluent [Alirocumab]     Had significant fatigue and memory loss--was lost driving in town.  Joint pain and nausea as well.   Zetia [Ezetimibe] Other  (See Comments)    Joint pain and ear ringing   Atorvastatin Other (See Comments)    Pt states causes bilateral lower extremity muscle cramps   Pravastatin Nausea And Vomiting and Other (See Comments)    Causes dizziness    Current Medications:  Current Outpatient Medications:    amLODipine (NORVASC) 5 MG tablet, Take 1 tablet (5 mg total) by mouth daily., Disp: 90 tablet, Rfl: 3   aspirin 81 MG tablet, Take 81 mg by mouth daily., Disp: , Rfl:    cetirizine (ZYRTEC) 10 MG tablet, Take 1 tablet (10 mg total) by mouth daily., Disp: 90 tablet, Rfl: 3   Cholecalciferol (VITAMIN D) 2000 units CAPS, Take 2,000 Units by mouth daily. 1 daily, Disp: , Rfl:    Cyanocobalamin (B-12 PO), Take 1 tablet by mouth daily., Disp: , Rfl:    GARLIC PO, Take 2 capsules by mouth daily. 2 daily, Disp: , Rfl:    glimepiride (AMARYL) 2 MG tablet, 1 1/2 tabs by mouth once daily with breakfast, Disp: 135 tablet, Rfl: 3   HYDROcodone-acetaminophen (NORCO) 5-325 MG tablet, Take 1 tablet by mouth every 6 (six) hours as needed for up to 5 days for severe pain., Disp: 20 tablet, Rfl: 0   ibuprofen (ADVIL) 200 MG tablet, 2-4 tabs by mouth twice daily  with meals as needed, Disp: 30 tablet, Rfl: 0   lisinopril (ZESTRIL) 40 MG tablet, Take 1 tablet by mouth once daily, Disp: 90 tablet, Rfl: 3   metoprolol succinate (TOPROL-XL) 50 MG 24 hr tablet, Take 1 tablet (50 mg total) by mouth daily. Take with or immediately following a meal., Disp: 90 tablet, Rfl: 3   Misc Natural Products (TART CHERRY ADVANCED PO), Take 5 mLs by mouth daily. , Disp: , Rfl:    mometasone (NASONEX) 50 MCG/ACT nasal spray, USE 2 SPRAY(S) IN EACH NOSTRIL ONCE DAILY, Disp: 51 g, Rfl: 3   Multiple Vitamins-Minerals (MULTIVITAMIN WITH MINERALS) tablet, Take 1 tablet by mouth daily., Disp: , Rfl:    nitroGLYCERIN (NITROSTAT) 0.4 MG SL tablet, DISSOLVE ONE TABLET UNDER THE TONGUE EVERY 5 MINUTES AS NEEDED FOR CHEST PAIN.  DO NOT EXCEED A TOTAL OF 3 DOSES IN 15  MINUTES, Disp: 25 tablet, Rfl: 1   omega-3 acid ethyl esters (LOVAZA) 1 g capsule, Take 1 capsule (1 g total) by mouth 2 (two) times daily., Disp: 180 capsule, Rfl: 3   ondansetron (ZOFRAN) 4 MG tablet, Take 1 tablet (4 mg total) by mouth every 8 (eight) hours as needed for nausea or vomiting., Disp: 20 tablet, Rfl: 0   rosuvastatin (CRESTOR) 10 MG tablet, Take 1 tablet (10 mg total) by mouth daily., Disp: 90 tablet, Rfl: 3   vitamin C (ASCORBIC ACID) 500 MG tablet, Take 500 mg by mouth daily., Disp: , Rfl:    zinc gluconate 50 MG tablet, Take 50 mg by mouth daily., Disp: , Rfl:   Past Medical Problems: Past Medical History:  Diagnosis Date   Back pain    Depression    DM type 2, controlled, with complication (HCC) 07/22/2013   Type II DM diagnosed in ~ 2006 per pt hx.    Environmental and seasonal allergies    Ganglion cyst of wrist, right 05/09/2017   Hyperlipidemia    Hypertension    Itching    Shingles    Stroke (HCC) 2000   per pt. report:   TIA    Past Surgical History: Past Surgical History:  Procedure Laterality Date   ABDOMINAL HYSTERECTOMY  1994   Laparoscopic; ovaries still intact.  Performed for prolapsed uterus.   CARDIAC CATHETERIZATION N/A 10/23/2016   Procedure: Left Heart Cath and Coronary Angiography;  Surgeon: Kathleene Hazel, MD;  [facility] INVASIVE CV LAB;  Service: Cardiovascular;  Laterality: N/A;   CHOLECYSTECTOMY  1997   Laparoscopic   COLONOSCOPY  2005-at age 49   Hyperplastic polyp--[place]   LEFT HEART CATH AND CORONARY ANGIOGRAPHY N/A 05/04/2020   Procedure: LEFT HEART CATH AND CORONARY ANGIOGRAPHY;  Surgeon: [place], Peter M, MD;  Location: [facility] INVASIVE CV LAB;  Service: Cardiovascular;  Laterality: N/A;   WISDOM TOOTH EXTRACTION      Social History: Social History   Socioeconomic History   Marital status: Single    Spouse name: Not on file   Number of children: 2   Years of education: 12+   Highest education level: Some college,  no degree  Occupational History   Occupation: Print production planner    Comment: Retired now  Tobacco Use   Smoking status: Never   Smokeless tobacco: Never  Vaping Use   Vaping Use: Never used  Substance and Sexual Activity   Alcohol use: Yes    Alcohol/week: 0.0 standard drinks    Comment: occasionally-1 to 2 drinks monthly    Drug use: No  Sexual activity: Not Currently    Birth control/protection: Surgical  Other Topics Concern   Not on file  Social History Narrative   Did get some post high school training/education   Lives alone   Watches 2 great grandchildren frequently   Daughter lives in [place] and checks in regularly.   Social Determinants of Health   Financial Resource Strain: Low Risk    Difficulty of Paying Living Expenses: Not hard at all  Food Insecurity: No Food Insecurity   Worried About Programme researcher, broadcasting/film/video in the Last Year: Never true   Ran Out of Food in the Last Year: Never true  Transportation Needs: No Transportation Needs   Lack of Transportation (Medical): No   Lack of Transportation (Non-Medical): No  Physical Activity: Not on file  Stress: Not on file  Social Connections: Not on file  Intimate Partner Violence: Not on file    Family History: Family History  Problem Relation Age of Onset   Diabetes Father    Stroke Maternal Aunt        Multiple aunts died from strokes-maternal   Hypertension Son    Diabetes Son    Hypertension Brother    Diabetes Brother    Diabetes Sister        prediabetic   Cancer Sister 41       uterine cancer   Seizures Brother     Review of Systems: Review of Systems  Constitutional: Negative.   Cardiovascular: Negative.   Gastrointestinal: Negative.   Musculoskeletal:  Positive for back pain and neck pain.  Neurological: Negative.    Physical Exam: Vital Signs BP (!) 154/82 (BP Location: Left Arm, Patient Position: Sitting, Cuff Size: Large)   Pulse 70   Ht 5\' 1"  (1.549 m)   Wt 233 lb (105.7 kg)   SpO2  98%   BMI 44.02 kg/m   Physical Exam  Constitutional:      General: Not in acute distress.    Appearance: Normal appearance. Not ill-appearing.  HENT:     Head: Normocephalic and atraumatic.  Eyes:     Pupils: Pupils are equal, round Neck:     Musculoskeletal: Normal range of motion.  Cardiovascular:     Rate and Rhythm: Normal rate    Pulses: Normal pulses.  Pulmonary:     Effort: Pulmonary effort is normal. No respiratory distress.  Musculoskeletal: Normal range of motion.  Skin:    General: Skin is warm and dry.     Findings: No erythema or rash.  Neurological:     General: No focal deficit present.     Mental Status: Alert and oriented to person, place, and time. Mental status is at baseline.     Motor: No weakness.  Psychiatric:        Mood and Affect: Mood normal.        Behavior: Behavior normal.    Assessment/Plan: The patient is scheduled for bilateral breast reduction with Dr. .  Risks, benefits, and alternatives of procedure discussed, questions answered and consent obtained.    Smoking Status: Non-smoker; Counseling Given?  N/A Last Mammogram: June 2022; Results: No mammographic evidence of malignancy.  Caprini Score: 6, high; Risk Factors include: Age, BMI greater than 40, and length of planned surgery. Recommendation for mechanical prophylaxis. Encourage early ambulation.   Pictures obtained: @consult  on 06/30/2021.  Post-op Rx sent to pharmacy: Norco, Zofran  She is currently on ASA 81 mg, prescribed by cardiology.  We will send cardiac clearance.  Patient was provided with the breast reduction and General Surgical Risk consent document and Pain Medication Agreement prior to their appointment.  They had adequate time to read through the risk consent documents and Pain Medication Agreement. We also discussed them in person together during this preop appointment. All of their questions were answered to their satisfaction.  Recommended calling if they have  any further questions.  Risk consent form and Pain Medication Agreement to be scanned into patient's chart.  The risk that can be encountered with breast reduction were discussed and include the following but not limited to these:  Breast asymmetry, fluid accumulation, firmness of the breast, inability to breast feed, loss of nipple or areola, skin loss, decrease or no nipple sensation, fat necrosis of the breast tissue, bleeding, infection, healing delay.  There are risks of anesthesia, changes to skin sensation and injury to nerves or blood vessels.  The muscle can be temporarily or permanently injured.  You may have an allergic reaction to tape, suture, glue, blood products which can result in skin discoloration, swelling, pain, skin lesions, poor healing.  Any of these can lead to the need for revisonal surgery or stage procedures.  A reduction has potential to interfere with diagnostic procedures.  Nipple or breast piercing can increase risks of infection.  This procedure is best done when the breast is fully developed.  Changes in the breast will continue to occur over time.  Pregnancy can alter the outcomes of previous breast reduction surgery, weight gain and weigh loss can also effect the long term appearance.   We discussed the likelihood of amputation/free nipple graft technique due to the length of her STN.  She is understanding of the likelihood that we would need to transition from a pedicle technique to a free nipple graft technique intraoperatively.  We discussed the risks associated with free nipple graft breast reductions, including but not limited to failure of the graft, partial loss of the graft, loss of sensation of bilateral nipple areola, complete loss of the nipple areola graft, inability to breast-feed, postoperative wounds, ongoing wound care.  We also discussed the risks associated with the pedicle technique.  We discussed that with the pedicle technique she could develop nipple  areolar necrosis which would result in loss of the nipple, this would also result in ongoing wound care and possible changes in the shape of her breast.   We discussed holding multivitamin, vitamin D, ibuprofen and any other supplements 1 to 2 weeks prior to surgery.  Patient reports that if blood products were necessary that she would NOT want any blood products.   Electronically signed by: Kwamane Whack J Tajae Maiolo, PA-C 08/31/2021 11:28 AM  

## 2021-08-30 NOTE — H&P (View-Only) (Signed)
Patient ID: Haley [name], female    DOB: 05/01/1956, 65 y.o.   MRN: [id number]  Chief Complaint  Patient presents with   Pre-op Exam      ICD-10-CM   1. Macromastia  N62     2. Back pain of thoracolumbar region  M54.50    M54.6     3. Postural kyphosis, thoracic region  M40.04        History of Present Illness: Haley [name] is a 65 y.o.  female  with a history of macromastia.  She presents for preoperative evaluation for upcoming procedure, Bilateral Breast Reduction, scheduled for 09/19/2021 with Dr.  Arita Miss  The patient has not had problems with anesthesia. No history of DVT/PE.  No family history of DVT/PE.  No family or personal history of bleeding or clotting disorders.  No history of CVA/MI.  Currently on ASA 81 mg daily  Summary of Previous Visit: Currently a G cup and would like to be around a C cup.  No family or personal history of breast cancer.  She is up-to-date on her mammograms and her last one was normal.  She does not smoke.  She is a diabetic but most recent A1c is 6.  STN is 40 on the right and 39 on the left.  Recommend free nipple graft.  **Patient reports that she would like to be as small as possible, she is comfortable being as small as a B cup.  She is not concerned about being proportional as she is mostly interested in having the weight removed.**  Estimated excess breast tissue to be removed at time of surgery: 800 grams  Job: Retired  PMH Significant for: Hypertension, type 2 diabetes mellitus, hyperlipidemia, history of TIA in 2000. History of nonobstructive coronary artery disease in 2017.  Patient denies any recent changes in her health, denies any shortness of breath at rest or with ambulation or ambulating upstairs.   Past Medical History: Allergies: Allergies  Allergen Reactions   Praluent [Alirocumab]     Had significant fatigue and memory loss--was lost driving in town.  Joint pain and nausea as well.   Zetia [Ezetimibe] Other  (See Comments)    Joint pain and ear ringing   Atorvastatin Other (See Comments)    Pt states causes bilateral lower extremity muscle cramps   Pravastatin Nausea And Vomiting and Other (See Comments)    Causes dizziness    Current Medications:  Current Outpatient Medications:    amLODipine (NORVASC) 5 MG tablet, Take 1 tablet (5 mg total) by mouth daily., Disp: 90 tablet, Rfl: 3   aspirin 81 MG tablet, Take 81 mg by mouth daily., Disp: , Rfl:    cetirizine (ZYRTEC) 10 MG tablet, Take 1 tablet (10 mg total) by mouth daily., Disp: 90 tablet, Rfl: 3   Cholecalciferol (VITAMIN D) 2000 units CAPS, Take 2,000 Units by mouth daily. 1 daily, Disp: , Rfl:    Cyanocobalamin (B-12 PO), Take 1 tablet by mouth daily., Disp: , Rfl:    GARLIC PO, Take 2 capsules by mouth daily. 2 daily, Disp: , Rfl:    glimepiride (AMARYL) 2 MG tablet, 1 1/2 tabs by mouth once daily with breakfast, Disp: 135 tablet, Rfl: 3   HYDROcodone-acetaminophen (NORCO) 5-325 MG tablet, Take 1 tablet by mouth every 6 (six) hours as needed for up to 5 days for severe pain., Disp: 20 tablet, Rfl: 0   ibuprofen (ADVIL) 200 MG tablet, 2-4 tabs by mouth twice daily  with meals as needed, Disp: 30 tablet, Rfl: 0   lisinopril (ZESTRIL) 40 MG tablet, Take 1 tablet by mouth once daily, Disp: 90 tablet, Rfl: 3   metoprolol succinate (TOPROL-XL) 50 MG 24 hr tablet, Take 1 tablet (50 mg total) by mouth daily. Take with or immediately following a meal., Disp: 90 tablet, Rfl: 3   Misc Natural Products (TART CHERRY ADVANCED PO), Take 5 mLs by mouth daily. , Disp: , Rfl:    mometasone (NASONEX) 50 MCG/ACT nasal spray, USE 2 SPRAY(S) IN EACH NOSTRIL ONCE DAILY, Disp: 51 g, Rfl: 3   Multiple Vitamins-Minerals (MULTIVITAMIN WITH MINERALS) tablet, Take 1 tablet by mouth daily., Disp: , Rfl:    nitroGLYCERIN (NITROSTAT) 0.4 MG SL tablet, DISSOLVE ONE TABLET UNDER THE TONGUE EVERY 5 MINUTES AS NEEDED FOR CHEST PAIN.  DO NOT EXCEED A TOTAL OF 3 DOSES IN 15  MINUTES, Disp: 25 tablet, Rfl: 1   omega-3 acid ethyl esters (LOVAZA) 1 g capsule, Take 1 capsule (1 g total) by mouth 2 (two) times daily., Disp: 180 capsule, Rfl: 3   ondansetron (ZOFRAN) 4 MG tablet, Take 1 tablet (4 mg total) by mouth every 8 (eight) hours as needed for nausea or vomiting., Disp: 20 tablet, Rfl: 0   rosuvastatin (CRESTOR) 10 MG tablet, Take 1 tablet (10 mg total) by mouth daily., Disp: 90 tablet, Rfl: 3   vitamin C (ASCORBIC ACID) 500 MG tablet, Take 500 mg by mouth daily., Disp: , Rfl:    zinc gluconate 50 MG tablet, Take 50 mg by mouth daily., Disp: , Rfl:   Past Medical Problems: Past Medical History:  Diagnosis Date   Back pain    Depression    DM type 2, controlled, with complication (HCC) 07/22/2013   Type II DM diagnosed in ~ 2006 per pt hx.    Environmental and seasonal allergies    Ganglion cyst of wrist, right 05/09/2017   Hyperlipidemia    Hypertension    Itching    Shingles    Stroke (HCC) 2000   per pt. report:   TIA    Past Surgical History: Past Surgical History:  Procedure Laterality Date   ABDOMINAL HYSTERECTOMY  1994   Laparoscopic; ovaries still intact.  Performed for prolapsed uterus.   CARDIAC CATHETERIZATION N/A 10/23/2016   Procedure: Left Heart Cath and Coronary Angiography;  Surgeon: Kathleene Hazel, MD;  [facility] INV[facility] CV LAB;  Service: Cardiovascular;  Laterality: N/A;   CHOLECYSTECTOMY  1997   Laparoscopic   COLONOSCOPY  2005-at age 49   Hyperplastic polyp--[place]   LEFT HEART CATH AND CORONARY ANGIOGRAPHY N/A 05/04/2020   Procedure: LEFT HEART CATH AND CORONARY ANGIOGRAPHY;  Surgeon: Swaz[place], Peter M, MD;  Location: [facility] INVASIVE CV LAB;  Service: Cardiovascular;  Laterality: N/A;   WISDOM TOOTH EXTRACTION      Social History: Social History   Socioeconomic History   Marital status: Single    Spouse name: Not on file   Number of children: 2   Years of education: 12+   Highest education level: Some college,  no degree  Occupational History   Occupation: Print production planner    Comment: Retired now  Tobacco Use   Smoking status: Never   Smokeless tobacco: Never  Vaping Use   Vaping Use: Never used  Substance and Sexual Activity   Alcohol use: Yes    Alcohol/week: 0.0 standard drinks    Comment: occasionally-1 to 2 drinks monthly    Drug use: No  Sexual activity: Not Currently    Birth control/protection: Surgical  Other Topics Concern   Not on file  Social History Narrative   Did get some post high school training/education   Lives alone   Watches 2 great grandchildren frequently   Daughter lives in [place] and checks in regularly.   Social Determinants of Health   Financial Resource Strain: Low Risk    Difficulty of Paying Living Expenses: Not hard at all  Food Insecurity: No Food Insecurity   Worried About Programme researcher, broadcasting/film/video in the Last Year: Never true   Ran Out of Food in the Last Year: Never true  Transportation Needs: No Transportation Needs   Lack of Transportation (Medical): No   Lack of Transportation (Non-Medical): No  Physical Activity: Not on file  Stress: Not on file  Social Connections: Not on file  Intimate Partner Violence: Not on file    Family History: Family History  Problem Relation Age of Onset   Diabetes Father    Stroke Maternal Aunt        Multiple aunts died from strokes-maternal   Hypertension Son    Diabetes Son    Hypertension Brother    Diabetes Brother    Diabetes Sister        prediabetic   Cancer Sister 41       uterine cancer   Seizures Brother     Review of Systems: Review of Systems  Constitutional: Negative.   Cardiovascular: Negative.   Gastrointestinal: Negative.   Musculoskeletal:  Positive for back pain and neck pain.  Neurological: Negative.    Physical Exam: Vital Signs BP (!) 154/82 (BP Location: Left Arm, Patient Position: Sitting, Cuff Size: Large)   Pulse 70   Ht 5\' 1"  (1.549 m)   Wt 233 lb (105.7 kg)   SpO2  98%   BMI 44.02 kg/m   Physical Exam  Constitutional:      General: Not in acute distress.    Appearance: Normal appearance. Not ill-appearing.  HENT:     Head: Normocephalic and atraumatic.  Eyes:     Pupils: Pupils are equal, round Neck:     Musculoskeletal: Normal range of motion.  Cardiovascular:     Rate and Rhythm: Normal rate    Pulses: Normal pulses.  Pulmonary:     Effort: Pulmonary effort is normal. No respiratory distress.  Musculoskeletal: Normal range of motion.  Skin:    General: Skin is warm and dry.     Findings: No erythema or rash.  Neurological:     General: No focal deficit present.     Mental Status: Alert and oriented to person, place, and time. Mental status is at baseline.     Motor: No weakness.  Psychiatric:        Mood and Affect: Mood normal.        Behavior: Behavior normal.    Assessment/Plan: The patient is scheduled for bilateral breast reduction with Dr. .  Risks, benefits, and alternatives of procedure discussed, questions answered and consent obtained.    Smoking Status: Non-smoker; Counseling Given?  N/A Last Mammogram: June 2022; Results: No mammographic evidence of malignancy.  Caprini Score: 6, high; Risk Factors include: Age, BMI greater than 40, and length of planned surgery. Recommendation for mechanical prophylaxis. Encourage early ambulation.   Pictures obtained: @consult  on 06/30/2021.  Post-op Rx sent to pharmacy: Norco, Zofran  She is currently on ASA 81 mg, prescribed by cardiology.  We will send cardiac clearance.  Patient was provided with the breast reduction and General Surgical Risk consent document and Pain Medication Agreement prior to their appointment.  They had adequate time to read through the risk consent documents and Pain Medication Agreement. We also discussed them in person together during this preop appointment. All of their questions were answered to their satisfaction.  Recommended calling if they have  any further questions.  Risk consent form and Pain Medication Agreement to be scanned into patient's chart.  The risk that can be encountered with breast reduction were discussed and include the following but not limited to these:  Breast asymmetry, fluid accumulation, firmness of the breast, inability to breast feed, loss of nipple or areola, skin loss, decrease or no nipple sensation, fat necrosis of the breast tissue, bleeding, infection, healing delay.  There are risks of anesthesia, changes to skin sensation and injury to nerves or blood vessels.  The muscle can be temporarily or permanently injured.  You may have an allergic reaction to tape, suture, glue, blood products which can result in skin discoloration, swelling, pain, skin lesions, poor healing.  Any of these can lead to the need for revisonal surgery or stage procedures.  A reduction has potential to interfere with diagnostic procedures.  Nipple or breast piercing can increase risks of infection.  This procedure is best done when the breast is fully developed.  Changes in the breast will continue to occur over time.  Pregnancy can alter the outcomes of previous breast reduction surgery, weight gain and weigh loss can also effect the long term appearance.   We discussed the likelihood of amputation/free nipple graft technique due to the length of her STN.  She is understanding of the likelihood that we would need to transition from a pedicle technique to a free nipple graft technique intraoperatively.  We discussed the risks associated with free nipple graft breast reductions, including but not limited to failure of the graft, partial loss of the graft, loss of sensation of bilateral nipple areola, complete loss of the nipple areola graft, inability to breast-feed, postoperative wounds, ongoing wound care.  We also discussed the risks associated with the pedicle technique.  We discussed that with the pedicle technique she could develop nipple  areolar necrosis which would result in loss of the nipple, this would also result in ongoing wound care and possible changes in the shape of her breast.   We discussed holding multivitamin, vitamin D, ibuprofen and any other supplements 1 to 2 weeks prior to surgery.  Patient reports that if blood products were necessary that she would NOT want any blood products.   Electronically signed by: Kermit Balo Sherolyn Trettin, PA-C 08/31/2021 11:28 AM

## 2021-08-31 ENCOUNTER — Ambulatory Visit (INDEPENDENT_AMBULATORY_CARE_PROVIDER_SITE_OTHER): Payer: Medicare Other | Admitting: Surgical

## 2021-08-31 ENCOUNTER — Other Ambulatory Visit: Payer: Self-pay

## 2021-08-31 ENCOUNTER — Encounter: Payer: Self-pay | Admitting: Surgical

## 2021-08-31 ENCOUNTER — Telehealth: Payer: Self-pay

## 2021-08-31 VITALS — BP 154/82 | HR 70 | Ht 61.0 in | Wt 233.0 lb

## 2021-08-31 DIAGNOSIS — M546 Pain in thoracic spine: Secondary | ICD-10-CM

## 2021-08-31 DIAGNOSIS — M545 Low back pain, unspecified: Secondary | ICD-10-CM

## 2021-08-31 DIAGNOSIS — M4004 Postural kyphosis, thoracic region: Secondary | ICD-10-CM

## 2021-08-31 DIAGNOSIS — N62 Hypertrophy of breast: Secondary | ICD-10-CM

## 2021-08-31 MED ORDER — HYDROCODONE-ACETAMINOPHEN 5-325 MG PO TABS: 1.0000 | ORAL_TABLET | Freq: Four times a day (QID) | ORAL | 0 refills | Status: AC | PRN

## 2021-08-31 MED ORDER — ONDANSETRON HCL 4 MG PO TABS: 4.0000 mg | ORAL_TABLET | Freq: Three times a day (TID) | ORAL | 0 refills | Status: DC | PRN

## 2021-08-31 NOTE — Telephone Encounter (Signed)
   [facility] Pre-operative Risk Assessment    Patient Name: Haley [name]  DOB: [birth year] MRN: [id number]   Request for surgical clearance:  What type of surgery is being performed? Bilateral Breast Reduction with free nipple graft  When is this surgery scheduled? 09/19/2021  What type of clearance is required (medical clearance vs. Pharmacy clearance to hold med vs. Both)? Pharmacy  Are there any medications that need to be held prior to surgery and how long? Aspirin - They are asking how long we recommend it to be held.  Practice name and name of physician performing surgery? [facility] - Dr. Mingo Amber  What is the office phone number? [phone]   7.   What is the office fax number? [phone] - ATTN: Haley Sessions, PA-C  8.   Anesthesia type (None, local, MAC, general) ? General   Haley [name] 08/31/2021, 2:23 PM  _________________________________________________________________   (provider comments below)

## 2021-08-31 NOTE — Telephone Encounter (Signed)
   Name: Haley [name]  DOB: [birth year]  MRN: [id number]   Primary Cardiologist: Meriam Sprague, MD  Chart reviewed as part of pre-operative protocol coverage. Patient was contacted 08/31/2021 in reference to pre-operative risk assessment for pending surgery as outlined below.  Haley [name] was last seen on 05/2021 by Dr. Shari Prows with history of nonobstructive coronary disease with last cath 04/2020, also prior history of TIA amongst other medical history outlined. 2D echo 05/2020 normal LVEF. Including history of prior CAD/nitrate therapy and TIA, her revised cardiac risk index is calculated at 6.6% indicating moderate baseline CV risk. I reached out to patient for update on how she is doing. The patient affirms she has been doing well without any new cardiac symptoms. She is walking regularly without angina or dyspnea. Therefore, based on ACC/AHA guidelines, the patient would be at acceptable risk for the planned procedure without further cardiovascular testing. The patient was advised that if she develops new symptoms prior to surgery to contact our office to arrange for a follow-up visit, and she verbalized understanding.  Regarding aspirin, there is no absolute cardiac contraindication to holding aspirin (I.e. 5-7 days) if absolutely necessary for the procedure. She has no history of stents, MI or bypass.  However, she does have a history of remote TIA so would recommend surgical team also seek input of primary care for clearance to hold ASA. I will route this recommendation to the requesting party via Epic fax function and remove from pre-op pool. Please call with questions.  Laurann Montana, PA-C 08/31/2021, 3:02 PM

## 2021-09-13 NOTE — Pre-Procedure Instructions (Addendum)
Surgical Instructions    Your procedure is scheduled on Monday, November 14th.  Report to [facility] Main Entrance "A" at 5:30 A.M., then check in with the Admitting office.  Call this number if you have problems the morning of surgery:  [phone]   If you have any questions prior to your surgery date call [phone]: Open Monday-Friday 8am-4pm    Remember:  Do not eat after midnight the night before your surgery  You may drink clear liquids until 4:30 a.m. the morning of your surgery.   Clear liquids allowed are: Water, Non-Citrus Juices (without pulp), Carbonated Beverages, Clear Tea, Black Coffee Only, and Gatorade.    Take these medicines the morning of surgery with A SIP OF WATER  amLODipine (NORVASC) cetirizine (ZYRTEC)  metoprolol succinate (TOPROL-XL) rosuvastatin (CRESTOR)  mometasone (NASONEX)  ondansetron (ZOFRAN)-as needed nitroGLYCERIN (NITROSTAT)-as needed; please let a nurse know if you had to use this.  Follow your surgeon's instructions on when to stop Aspirin.  If no instructions were given by your surgeon then you will need to call the office to get those instructions.    As of today, STOP taking any Aleve, Naproxen, Ibuprofen, Motrin, Advil, Goody's, BC's, all herbal medications, fish oil, and all vitamins.                     Do NOT Smoke (Tobacco/Vaping) or drink Alcohol 24 hours prior to your procedure.  If you use a CPAP at night, you may bring all equipment for your overnight stay.   Contacts, glasses, piercing's, hearing aid's, dentures or partials may not be worn into surgery, please bring cases for these belongings.    For patients admitted to the hospital, discharge time will be determined by your treatment team.   Patients discharged the day of surgery will not be allowed to drive home, and someone needs to stay with them for 24 hours.  NO VISITORS WILL BE ALLOWED IN PRE-OP WHERE PATIENTS GET READY FOR SURGERY.  ONLY 1 SUPPORT PERSON MAY BE  PRESENT IN THE WAITING ROOM WHILE YOU ARE IN SURGERY.  IF YOU ARE TO BE ADMITTED, ONCE YOU ARE IN YOUR ROOM YOU WILL BE ALLOWED TWO (2) VISITORS.  Minor children may have two parents present. Special consideration for safety and communication needs will be reviewed on a case by case basis.   Special instructions:   [place]- Preparing For Surgery  Before surgery, you can play an important role. Because skin is not sterile, your skin needs to be as free of germs as possible. You can reduce the number of germs on your skin by washing with CHG (chlorahexidine gluconate) Soap before surgery.  CHG is an antiseptic cleaner which kills germs and bonds with the skin to continue killing germs even after washing.    Oral Hygiene is also important to reduce your risk of infection.  Remember - BRUSH YOUR TEETH THE MORNING OF SURGERY WITH YOUR REGULAR TOOTHPASTE  Please do not use if you have an allergy to CHG or antibacterial soaps. If your skin becomes reddened/irritated stop using the CHG.  Do not shave (including legs and underarms) for at least 48 hours prior to first CHG shower. It is OK to shave your face.  Please follow these instructions carefully.   Shower the NIGHT BEFORE SURGERY and the MORNING OF SURGERY  If you chose to wash your hair, wash your hair first as usual with your normal shampoo.  After you shampoo, rinse your hair  and body thoroughly to remove the shampoo.  Use CHG Soap as you would any other liquid soap. You can apply CHG directly to the skin and wash gently with a scrungie or a clean washcloth.   Apply the CHG Soap to your body ONLY FROM THE NECK DOWN.  Do not use on open wounds or open sores. Avoid contact with your eyes, ears, mouth and genitals (private parts). Wash Face and genitals (private parts)  with your normal soap.   Wash thoroughly, paying special attention to the area where your surgery will be performed.  Thoroughly rinse your body with warm water from the  neck down.  DO NOT shower/wash with your normal soap after using and rinsing off the CHG Soap.  Pat yourself dry with a CLEAN TOWEL.  Wear CLEAN PAJAMAS to bed the night before surgery  Place CLEAN SHEETS on your bed the night before your surgery  DO NOT SLEEP WITH PETS.   Day of Surgery: Shower with CHG soap. Do not wear jewelry, make up, nail polish, gel polish, artificial nails, or any other type of covering on natural nails including finger and toenails. If patients have artificial nails, gel coating, etc. that need to be removed by a nail salon please have this removed prior to surgery. Surgery may need to be canceled/delayed if the surgeon/ anesthesia feels like the patient is unable to be adequately monitored. Do not wear lotions, powders, perfumes, or deodorant. Do not shave 48 hours prior to surgery.  Do not bring valuables to the hospital. [facility] is not responsible for any belongings or valuables. Wear Clean/Comfortable clothing the morning of surgery Remember to brush your teeth WITH YOUR REGULAR TOOTHPASTE.   Please read over the following fact sheets that you were given.   3 days prior to your procedure or After your COVID test   You are not required to quarantine however you are required to wear a well-fitting mask when you are out and around people not in your household. If your mask becomes wet or soiled, replace with a new one.   Wash your hands often with soap and water for 20 seconds or clean your hands with an alcohol-based hand sanitizer that contains at least 60% alcohol.   Do not share personal items.   Notify your provider:  o if you are in close contact with someone who has COVID  o or if you develop a fever of 100.4 or greater, sneezing, cough, sore throat, shortness of breath or body aches.

## 2021-09-14 ENCOUNTER — Encounter (HOSPITAL_COMMUNITY)
Admission: RE | Admit: 2021-09-14 | Discharge: 2021-09-14 | Disposition: A | Discharge status: Home / Self Care | Payer: Medicare Other | Source: Ambulatory Visit | Attending: Plastic Surgery | Admitting: Plastic Surgery

## 2021-09-14 ENCOUNTER — Other Ambulatory Visit: Payer: Self-pay

## 2021-09-14 ENCOUNTER — Encounter (HOSPITAL_COMMUNITY): Payer: Self-pay

## 2021-09-14 VITALS — BP 168/94 | HR 77 | Temp 98.5°F | Resp 17 | Ht 61.0 in | Wt 227.8 lb

## 2021-09-14 DIAGNOSIS — N62 Hypertrophy of breast: Secondary | ICD-10-CM | POA: Insufficient documentation

## 2021-09-14 DIAGNOSIS — E119 Type 2 diabetes mellitus without complications: Secondary | ICD-10-CM | POA: Diagnosis not present

## 2021-09-14 DIAGNOSIS — I10 Essential (primary) hypertension: Secondary | ICD-10-CM | POA: Diagnosis not present

## 2021-09-14 DIAGNOSIS — E785 Hyperlipidemia, unspecified: Secondary | ICD-10-CM | POA: Diagnosis not present

## 2021-09-14 DIAGNOSIS — Z8673 Personal history of transient ischemic attack (TIA), and cerebral infarction without residual deficits: Secondary | ICD-10-CM | POA: Diagnosis not present

## 2021-09-14 DIAGNOSIS — I2510 Atherosclerotic heart disease of native coronary artery without angina pectoris: Secondary | ICD-10-CM | POA: Insufficient documentation

## 2021-09-14 DIAGNOSIS — Z01818 Encounter for other preprocedural examination: Secondary | ICD-10-CM

## 2021-09-14 DIAGNOSIS — Z01812 Encounter for preprocedural laboratory examination: Secondary | ICD-10-CM | POA: Insufficient documentation

## 2021-09-14 HISTORY — DX: Atherosclerotic heart disease of native coronary artery without angina pectoris: I25.10

## 2021-09-14 HISTORY — DX: Procedure and treatment not carried out because of patient's decision for reasons of belief and group pressure: Z53.1

## 2021-09-14 HISTORY — DX: Reserved for inherently not codable concepts without codable children: IMO0001

## 2021-09-14 LAB — CBC
HCT: 41.6 % (ref 36.0–46.0)
Hemoglobin: 13.9 g/dL (ref 12.0–15.0)
MCH: 31.7 pg (ref 26.0–34.0)
MCHC: 33.4 g/dL (ref 30.0–36.0)
MCV: 95 fL (ref 80.0–100.0)
Platelets: 240 10*3/uL (ref 150–400)
RBC: 4.38 MIL/uL (ref 3.87–5.11)
RDW: 12.4 % (ref 11.5–15.5)
WBC: 5.4 10*3/uL (ref 4.0–10.5)
nRBC: 0 % (ref 0.0–0.2)

## 2021-09-14 LAB — NO BLOOD PRODUCTS

## 2021-09-14 LAB — GLUCOSE, CAPILLARY: Glucose-Capillary: 103 mg/dL — ABNORMAL HIGH (ref 70–99)

## 2021-09-14 NOTE — Progress Notes (Signed)
PCP - Julieanne Manson, MD Cardiologist - Laurance Flatten, MD  PPM/ICD - n/a  Chest x-ray - n/a EKG - 05/06/21 Stress Test - 11/12/18 ECHO - 05/12/20 Cardiac Cath - 05/04/20  Sleep Study - pt denies  Fasting Blood Sugar - 89-90 per pt; 103 in PAT Checks Blood Sugar twice a day  Blood Thinner Instructions: n/a Aspirin Instructions: Follow your surgeon's instructions on when to stop Aspirin.  If no instructions were given by your surgeon then you will need to call the office to get those instructions.    ERAS Protcol - yes, clears until 0430am PRE-SURGERY Ensure or G2-  n/a  COVID TEST- n/a; ambulatory surgery. Instructed to wear mask.  **pt Jehovah's witness and refuses blood products; blood refusal faxed to blood bank  Anesthesia review: yes; cardiac hx; cardiac clearance 08/31/21  Patient denies shortness of breath, fever, cough and chest pain at PAT appointment   All instructions explained to the patient, with a verbal understanding of the material. Patient agrees to go over the instructions while at home for a better understanding. The opportunity to ask questions was provided.

## 2021-09-15 NOTE — Anesthesia Preprocedure Evaluation (Addendum)
Anesthesia Evaluation  Patient identified by MRN, date of birth, ID band Patient awake    Reviewed: Allergy & Precautions, NPO status , Patient's Chart, lab work & pertinent test results, reviewed documented beta blocker date and time   Airway Mallampati: III  TM Distance: >3 FB Neck ROM: Full    Dental no notable dental hx. (+) Missing, Dental Advisory Given,    Pulmonary neg pulmonary ROS,    Pulmonary exam normal breath sounds clear to auscultation       Cardiovascular hypertension (168/90 in preop- normally 130/80), Pt. on medications and Pt. on home beta blockers + CAD (non-obstructive)  Normal cardiovascular exam Rhythm:Regular Rate:Normal  Stress test 2020 and echo 2021: normal  Cath 2021: ? Ost LAD to Prox LAD lesion is 30% stenosed. ? RPDA lesion is 50% stenosed. ? The left ventricular systolic function is normal. ? LV end diastolic pressure is mildly elevated. ? The left ventricular ejection fraction is 55-65% by visual estimate. 1. Nonobstructive CAD. Unchanged from 2017. 2. Normal LV function 3. Mildly elevated LVEDP 20 mm Hg    Neuro/Psych PSYCHIATRIC DISORDERS Depression TIA   GI/Hepatic Neg liver ROS, GERD  Controlled,  Endo/Other  diabetes, Well Controlled, Type 2, Oral Hypoglycemic AgentsMorbid obesityBMI 43 a1c 6.5  Renal/GU negative Renal ROS  negative genitourinary   Musculoskeletal Chronic back pain    Abdominal (+) + obese,   Peds  Hematology negative hematology ROS (+) REFUSES BLOOD PRODUCTS, hct 41.6   Anesthesia Other Findings Macromastia  Reproductive/Obstetrics negative OB ROS S/p hysterectomy                            Anesthesia Physical Anesthesia Plan  ASA: 3  Anesthesia Plan: General   Post-op Pain Management:    Induction: Intravenous  PONV Risk Score and Plan: 3 and Ondansetron, Dexamethasone, Midazolam, Scopolamine patch - Pre-op and Treatment  may vary due to age or medical condition  Airway Management Planned: Oral ETT  Additional Equipment:   Intra-op Plan:   Post-operative Plan: Extubation in OR  Informed Consent: I have reviewed the patients History and Physical, chart, labs and discussed the procedure including the risks, benefits and alternatives for the proposed anesthesia with the patient or authorized representative who has indicated his/her understanding and acceptance.     Dental advisory given  Plan Discussed with: CRNA  Anesthesia Plan Comments: ( )      Anesthesia Quick Evaluation

## 2021-09-15 NOTE — Progress Notes (Signed)
Anesthesia Chart Review:  Case: [id number] Date/Time: 09/19/21 0715   Procedures:      Bilateral Breast Reduction (Bilateral: Breast) - 2 hours     FREE NIPPLE GRAFT (Bilateral)   Anesthesia type: General   Pre-op diagnosis: Macromastia   Location: [facility] OR ROOM 07 / [place] [facility]   Surgeons: Cindra Presume, MD       DISCUSSION: Patient is a 65 year old female scheduled for the above procedure.  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 Jehovah's Witness (refusal of blood or blood products). BMI is consistent with morbid obesity.  Preoperative cardiology input outline on 08/31/21 by Melina Copa, PA-C, "Patient was contacted 08/31/2021 in reference to pre-operative risk assessment for pending surgery as outlined below.  Haley [name] was last seen on 05/2021 by Dr. Johney Frame with history of nonobstructive coronary disease with last cath 04/2020, also prior history of TIA amongst other medical history outlined. 2D echo 05/2020 normal LVEF. Including history of prior CAD/nitrate therapy and TIA, her revised cardiac risk index is calculated at 6.6% indicating moderate baseline CV risk. I reached out to patient for update on how she is doing. The patient affirms she has been doing well without any new cardiac symptoms. She is walking regularly without angina or dyspnea. Therefore, based on ACC/AHA guidelines, the patient would be at acceptable risk for the planned procedure without further cardiovascular testing. The patient was advised that if she develops new symptoms prior to surgery to contact our office to arrange for a follow-up visit, and she verbalized understanding." If absolutely necessary for procedure, ASA can be held 5-7 days from a cardiac standpoint, but she recommended also touching base with primary care given TIA history.   Preoperative CBC is normal. Per Plastic Surgery preoperative visit by Scheeler, Dickie La, he is aware that she does not want  any blood products.   Anesthesia team to evaluate on the day of surgery.    VS: BP (!) 168/94   Pulse 77   Temp 36.9 C (Oral)   Resp 17   Ht $R'5\' 1"'ha$  (1.549 m)   Wt 103.3 kg   SpO2 99%   BMI 43.04 kg/m    PROVIDERS: Mack Hook, MD is PCP ([employer]). Last visit 08/15/21. She is aware of surgery plans. Gwyndolyn Kaufman, MD is cardiologist. Last visit 05/06/21. No anginal symptoms. Walking daily Headaches with Imdur, so changed to Ranexa. (Ranexa off medication list 06/30/21 as "No longer needed."). Palpitations controlled with metoprolol. Six month follow-up planned.    LABS: CBC normal on 09/14/21. CMET unremarkable on 08/26/21 except Cr 1.11 with eGFR 54. A1c 6.5% 08/26/21.  Labs Reviewed  GLUCOSE, CAPILLARY - Abnormal; Notable for the following components:      Result Value   Glucose-Capillary 103 (*)    All other components within normal limits  CBC  NO BLOOD PRODUCTS    EKG: 05/06/21:  Normal sinus rhythm Low voltage QRS Cannot rule out anterior infarct, age undetermined   CV: Echo 05/12/20: IMPRESSIONS   1. Left ventricular ejection fraction, by estimation, is 60 to 65%. The  left ventricle has normal function. The left ventricle has no regional  wall motion abnormalities. There is mild concentric left ventricular  hypertrophy. Left ventricular diastolic  parameters are indeterminate.   2. Right ventricular systolic function is normal. The right ventricular  size is normal. Tricuspid regurgitation signal is inadequate for assessing  PA pressure.   3. The mitral valve is  normal in structure. Trivial mitral valve  regurgitation. No evidence of mitral stenosis.   4. The aortic valve is tricuspid. Aortic valve regurgitation is not  visualized. No aortic stenosis is present.   5. The inferior vena cava is normal in size with greater than 50%  respiratory variability, suggesting right atrial pressure of 3 mmHg.  - Comparison(s): No significant  change from prior study.  - Conclusion(s)/Recommendation(s): Normal biventricular function without  evidence of hemodynamically significant valvular heart disease.    Cardiac cath 05/04/20: Ost LAD to Prox LAD lesion is 30% stenosed. RPDA lesion is 50% stenosed. The left ventricular systolic function is normal. LV end diastolic pressure is mildly elevated. The left ventricular ejection fraction is 55-65% by visual estimate.  1. Nonobstructive CAD. Unchanged from 2017. 2. Normal LV function 3. Mildly elevated LVEDP 20 mm Hg  Plan: medical management.    Cardiac event monitor 10/16/18-11/14/18: Study Highlights Sinus rhythm. Occasional PACs and PVCs. Normal event monitor. Continue the same dose of metoprolol.   Past Medical History:  Diagnosis Date   Back pain    Coronary artery disease    Depression    DM type 2, controlled, with complication ([place]) 91/47/8295   Type II DM diagnosed in ~ 2006 per pt hx.    Environmental and seasonal allergies    Ganglion cyst of wrist, right 05/09/2017   Hyperlipidemia    Hypertension    Itching    Refusal of blood transfusions as patient is Jehovah's Witness    Shingles    Stroke ([place]) 2000   per pt. report:   TIA    Past Surgical History:  Procedure Laterality Date   ABDOMINAL HYSTERECTOMY  1994   Laparoscopic; ovaries still intact.  Performed for prolapsed uterus.   CARDIAC CATHETERIZATION N/A 10/23/2016   Procedure: Left Heart Cath and Coronary Angiography;  Surgeon: Burnell Blanks, MD;  Location: [place] [facility];  Service: Cardiovascular;  Laterality: N/A;   CHOLECYSTECTOMY  1997   Laparoscopic   COLONOSCOPY  2005-at age 32   Hyperplastic polyp--[place]   LEFT HEART CATH AND CORONARY ANGIOGRAPHY N/A 05/04/2020   Procedure: LEFT HEART CATH AND CORONARY ANGIOGRAPHY;  Surgeon: Martinique, Peter M, MD;  Location: [place] [facility];  Service: Cardiovascular;  Laterality: N/A;   WISDOM TOOTH EXTRACTION      MEDICATIONS:   amLODipine (NORVASC) 5 MG tablet   aspirin 81 MG tablet   cetirizine (ZYRTEC) 10 MG tablet   Cholecalciferol (DIALYVITE VITAMIN D 5000) 125 MCG (5000 UT) capsule   Cyanocobalamin (A-21 PO)   GARLIC PO   glimepiride (AMARYL) 2 MG tablet   ibuprofen (ADVIL) 200 MG tablet   Liniments (BLUE-EMU SUPER STRENGTH EX)   lisinopril (ZESTRIL) 40 MG tablet   metoprolol succinate (TOPROL-XL) 50 MG 24 hr tablet   mometasone (NASONEX) 50 MCG/ACT nasal spray   Multiple Vitamins-Minerals (MULTIVITAMIN WITH MINERALS) tablet   nitroGLYCERIN (NITROSTAT) 0.4 MG SL tablet   omega-3 acid ethyl esters (LOVAZA) 1 g capsule   ondansetron (ZOFRAN) 4 MG tablet   rosuvastatin (CRESTOR) 10 MG tablet   vitamin C (ASCORBIC ACID) 500 MG tablet   zinc gluconate 50 MG tablet   No current facility-administered medications for this encounter.  Patient advised to follow-up surgeon's instructions regarding. ASA.    Myra Gianotti, PA-C Surgical Short Stay/Anesthesiology [facility] Phone [phone] [facility] Phone [phone] 09/15/2021 11:57 AM

## 2021-09-19 ENCOUNTER — Ambulatory Visit (HOSPITAL_COMMUNITY): Payer: Medicare Other | Admitting: Vascular Surgery

## 2021-09-19 ENCOUNTER — Other Ambulatory Visit: Payer: Self-pay

## 2021-09-19 ENCOUNTER — Encounter (HOSPITAL_COMMUNITY): Payer: Self-pay | Admitting: Plastic Surgery

## 2021-09-19 ENCOUNTER — Ambulatory Visit (HOSPITAL_COMMUNITY): Payer: Medicare Other | Admitting: Anesthesiology

## 2021-09-19 ENCOUNTER — Ambulatory Visit (HOSPITAL_COMMUNITY)
Admission: RE | Admit: 2021-09-19 | Discharge: 2021-09-19 | Disposition: A | Discharge status: Home / Self Care | Payer: Medicare Other | Attending: Plastic Surgery | Admitting: Plastic Surgery

## 2021-09-19 ENCOUNTER — Encounter (HOSPITAL_COMMUNITY)
Admission: RE | Disposition: A | Discharge status: Home / Self Care | Payer: Self-pay | Source: Home / Self Care | Attending: Plastic Surgery

## 2021-09-19 DIAGNOSIS — E119 Type 2 diabetes mellitus without complications: Secondary | ICD-10-CM | POA: Insufficient documentation

## 2021-09-19 DIAGNOSIS — Z79899 Other long term (current) drug therapy: Secondary | ICD-10-CM | POA: Insufficient documentation

## 2021-09-19 DIAGNOSIS — N62 Hypertrophy of breast: Secondary | ICD-10-CM | POA: Diagnosis not present

## 2021-09-19 DIAGNOSIS — M546 Pain in thoracic spine: Secondary | ICD-10-CM | POA: Insufficient documentation

## 2021-09-19 DIAGNOSIS — Z6841 Body Mass Index (BMI) 40.0 and over, adult: Secondary | ICD-10-CM | POA: Diagnosis not present

## 2021-09-19 DIAGNOSIS — M40204 Unspecified kyphosis, thoracic region: Secondary | ICD-10-CM | POA: Diagnosis not present

## 2021-09-19 DIAGNOSIS — Z7984 Long term (current) use of oral hypoglycemic drugs: Secondary | ICD-10-CM | POA: Insufficient documentation

## 2021-09-19 DIAGNOSIS — M4004 Postural kyphosis, thoracic region: Secondary | ICD-10-CM

## 2021-09-19 DIAGNOSIS — I10 Essential (primary) hypertension: Secondary | ICD-10-CM | POA: Insufficient documentation

## 2021-09-19 HISTORY — PX: AREOLA/NIPPLE RECONSTRUCTION WITH GRAFT: SHX5586

## 2021-09-19 HISTORY — PX: REDUCTION MAMMAPLASTY: SUR839

## 2021-09-19 HISTORY — PX: BREAST REDUCTION SURGERY: SHX8

## 2021-09-19 LAB — GLUCOSE, CAPILLARY
Glucose-Capillary: 126 mg/dL — ABNORMAL HIGH (ref 70–99)
Glucose-Capillary: 129 mg/dL — ABNORMAL HIGH (ref 70–99)

## 2021-09-19 SURGERY — MAMMOPLASTY, REDUCTION
Anesthesia: General | Site: Breast | Laterality: Bilateral

## 2021-09-19 MED ORDER — LIDOCAINE HCL (CARDIAC) PF 100 MG/5ML IV SOSY
PREFILLED_SYRINGE | INTRAVENOUS | Status: DC | PRN
  Administered 2021-09-19: 60 mg via INTRATRACHEAL
  Administered 2021-09-19: 40 mg via INTRATRACHEAL

## 2021-09-19 MED ORDER — OXYCODONE HCL 5 MG/5ML PO SOLN: 5.0000 mg | Freq: Once | ORAL | Status: DC | PRN

## 2021-09-19 MED ORDER — LACTATED RINGERS IV SOLN: INTRAVENOUS | Status: DC

## 2021-09-19 MED ORDER — MIDAZOLAM HCL 2 MG/2ML IJ SOLN
INTRAMUSCULAR | Status: DC | PRN
  Administered 2021-09-19: 2 mg via INTRAVENOUS

## 2021-09-19 MED ORDER — CHLORHEXIDINE GLUCONATE 0.12 % MT SOLN
15.0000 mL | Freq: Once | OROMUCOSAL | Status: AC
  Administered 2021-09-19: 15 mL via OROMUCOSAL
  Filled 2021-09-19: qty 15

## 2021-09-19 MED ORDER — 0.9 % SODIUM CHLORIDE (POUR BTL) OPTIME
TOPICAL | Status: DC | PRN
  Administered 2021-09-19: 1000 mL

## 2021-09-19 MED ORDER — AMISULPRIDE (ANTIEMETIC) 5 MG/2ML IV SOLN: 10.0000 mg | Freq: Once | INTRAVENOUS | Status: DC | PRN

## 2021-09-19 MED ORDER — HYDROMORPHONE HCL 1 MG/ML IJ SOLN
INTRAMUSCULAR | Status: AC
  Filled 2021-09-19: qty 1

## 2021-09-19 MED ORDER — EPHEDRINE SULFATE 50 MG/ML IJ SOLN
INTRAMUSCULAR | Status: DC | PRN
  Administered 2021-09-19 (×2): 10 mg via INTRAVENOUS

## 2021-09-19 MED ORDER — HYDROMORPHONE HCL 1 MG/ML IJ SOLN
INTRAMUSCULAR | Status: DC | PRN
  Administered 2021-09-19: .5 mg via INTRAVENOUS

## 2021-09-19 MED ORDER — DEXAMETHASONE SODIUM PHOSPHATE 10 MG/ML IJ SOLN
INTRAMUSCULAR | Status: DC | PRN
  Administered 2021-09-19: 4 mg via INTRAVENOUS

## 2021-09-19 MED ORDER — ONDANSETRON HCL 4 MG/2ML IJ SOLN
INTRAMUSCULAR | Status: DC | PRN
  Administered 2021-09-19: 4 mg via INTRAVENOUS

## 2021-09-19 MED ORDER — MIDAZOLAM HCL 2 MG/2ML IJ SOLN
INTRAMUSCULAR | Status: AC
  Filled 2021-09-19: qty 2

## 2021-09-19 MED ORDER — ACETAMINOPHEN 500 MG PO TABS
1000.0000 mg | ORAL_TABLET | Freq: Once | ORAL | Status: AC
  Administered 2021-09-19: 1000 mg via ORAL
  Filled 2021-09-19: qty 2

## 2021-09-19 MED ORDER — SODIUM BICARBONATE 4.2 % IV SOLN
INTRAVENOUS | Status: DC
  Filled 2021-09-19 (×2): qty 50

## 2021-09-19 MED ORDER — SUGAMMADEX SODIUM 200 MG/2ML IV SOLN
INTRAVENOUS | Status: DC | PRN
  Administered 2021-09-19: 200 mg via INTRAVENOUS

## 2021-09-19 MED ORDER — HYDROMORPHONE HCL 1 MG/ML IJ SOLN
INTRAMUSCULAR | Status: AC
  Filled 2021-09-19: qty 0.5

## 2021-09-19 MED ORDER — PHENYLEPHRINE HCL (PRESSORS) 10 MG/ML IV SOLN
INTRAVENOUS | Status: DC | PRN
  Administered 2021-09-19: 80 ug via INTRAVENOUS
  Administered 2021-09-19: 40 ug via INTRAVENOUS
  Administered 2021-09-19: 120 ug via INTRAVENOUS
  Administered 2021-09-19 (×2): 80 ug via INTRAVENOUS

## 2021-09-19 MED ORDER — ORAL CARE MOUTH RINSE: 15.0000 mL | Freq: Once | OROMUCOSAL | Status: AC

## 2021-09-19 MED ORDER — FENTANYL CITRATE (PF) 250 MCG/5ML IJ SOLN
INTRAMUSCULAR | Status: AC
  Filled 2021-09-19: qty 5

## 2021-09-19 MED ORDER — FENTANYL CITRATE (PF) 250 MCG/5ML IJ SOLN
INTRAMUSCULAR | Status: DC | PRN
  Administered 2021-09-19: 75 ug via INTRAVENOUS
  Administered 2021-09-19 (×2): 50 ug via INTRAVENOUS
  Administered 2021-09-19: 25 ug via INTRAVENOUS
  Administered 2021-09-19: 50 ug via INTRAVENOUS

## 2021-09-19 MED ORDER — PROPOFOL 10 MG/ML IV BOLUS
INTRAVENOUS | Status: DC | PRN
  Administered 2021-09-19: 150 mg via INTRAVENOUS

## 2021-09-19 MED ORDER — HYDROMORPHONE HCL 1 MG/ML IJ SOLN
0.2500 mg | INTRAMUSCULAR | Status: DC | PRN
  Administered 2021-09-19: 0.5 mg via INTRAVENOUS
  Administered 2021-09-19: 0.25 mg via INTRAVENOUS

## 2021-09-19 MED ORDER — ROCURONIUM BROMIDE 100 MG/10ML IV SOLN
INTRAVENOUS | Status: DC | PRN
  Administered 2021-09-19: 100 mg via INTRAVENOUS

## 2021-09-19 MED ORDER — OXYCODONE HCL 5 MG PO TABS: 5.0000 mg | ORAL_TABLET | Freq: Once | ORAL | Status: DC | PRN

## 2021-09-19 MED ORDER — CEFAZOLIN SODIUM-DEXTROSE 2-4 GM/100ML-% IV SOLN
2.0000 g | INTRAVENOUS | Status: AC
  Administered 2021-09-19: 2 g via INTRAVENOUS
  Filled 2021-09-19: qty 100

## 2021-09-19 MED ORDER — LIDOCAINE HCL 1 % IJ SOLN
INTRAVENOUS | Status: DC | PRN
  Administered 2021-09-19: 2000 mL

## 2021-09-19 MED ORDER — SCOPOLAMINE 1 MG/3DAYS TD PT72
1.0000 | MEDICATED_PATCH | TRANSDERMAL | Status: DC
  Administered 2021-09-19: 1.5 mg via TRANSDERMAL
  Filled 2021-09-19: qty 1

## 2021-09-19 MED ORDER — ONDANSETRON HCL 4 MG/2ML IJ SOLN: 4.0000 mg | Freq: Once | INTRAMUSCULAR | Status: DC | PRN

## 2021-09-19 SURGICAL SUPPLY — 63 items
APL PRP STRL LF DISP 70% ISPRP (MISCELLANEOUS) ×3
APL SKNCLS STERI-STRIP NONHPOA (GAUZE/BANDAGES/DRESSINGS) ×1
BAG COUNTER SPONGE SURGICOUNT (BAG) ×2 IMPLANT
BAG DECANTER FOR FLEXI CONT (MISCELLANEOUS) ×2 IMPLANT
BAG SPNG CNTER NS LX DISP (BAG) ×2
BENZOIN TINCTURE PRP APPL 2/3 (GAUZE/BANDAGES/DRESSINGS) ×3 IMPLANT
BIOPATCH RED 1 DISK 7.0 (GAUZE/BANDAGES/DRESSINGS) IMPLANT
BLADE SURG 10 STRL SS (BLADE) ×2 IMPLANT
BLADE SURG 15 STRL LF DISP TIS (BLADE) ×1 IMPLANT
BLADE SURG 15 STRL SS (BLADE)
BNDG CMPR MED 10X6 ELC LF (GAUZE/BANDAGES/DRESSINGS) ×1
BNDG ELASTIC 6X10 VLCR STRL LF (GAUZE/BANDAGES/DRESSINGS) ×1 IMPLANT
BNDG ELASTIC 6X5.8 VLCR STR LF (GAUZE/BANDAGES/DRESSINGS) ×2 IMPLANT
BNDG GAUZE ELAST 4 BULKY (GAUZE/BANDAGES/DRESSINGS) IMPLANT
BRUSH SCRUB EZ PLAIN DRY (MISCELLANEOUS) ×1 IMPLANT
CHLORAPREP W/TINT 26 (MISCELLANEOUS) ×5 IMPLANT
CLIP VESOCCLUDE MED 6/CT (CLIP) IMPLANT
DECANTER SPIKE VIAL GLASS SM (MISCELLANEOUS) IMPLANT
DRAIN CHANNEL 15F RND FF W/TCR (WOUND CARE) IMPLANT
DRAIN PENROSE 1/2X12 LTX STRL (WOUND CARE) ×1 IMPLANT
DRAIN PENROSE 1/4X12 LTX STRL (WOUND CARE) IMPLANT
DRAPE LAPAROSCOPIC ABDOMINAL (DRAPES) ×2 IMPLANT
DRAPE UTILITY XL STRL (DRAPES) ×2 IMPLANT
DRSG PAD ABDOMINAL 8X10 ST (GAUZE/BANDAGES/DRESSINGS) ×8 IMPLANT
ELECT REM PT RETURN 9FT ADLT (ELECTROSURGICAL) ×2
ELECTRODE REM PT RTRN 9FT ADLT (ELECTROSURGICAL) ×1 IMPLANT
EVACUATOR SILICONE 100CC (DRAIN) IMPLANT
GAUZE SPONGE 4X4 12PLY STRL (GAUZE/BANDAGES/DRESSINGS) ×3 IMPLANT
GAUZE XEROFORM 5X9 LF (GAUZE/BANDAGES/DRESSINGS) ×2 IMPLANT
GLOVE SRG 8 PF TXTR STRL LF DI (GLOVE) IMPLANT
GLOVE SURG ENC TEXT LTX SZ7.5 (GLOVE) ×3 IMPLANT
GLOVE SURG UNDER POLY LF SZ8 (GLOVE) ×4
GOWN STRL REUS W/ TWL LRG LVL3 (GOWN DISPOSABLE) ×2 IMPLANT
GOWN STRL REUS W/TWL LRG LVL3 (GOWN DISPOSABLE) ×6
KIT BASIN OR (CUSTOM PROCEDURE TRAY) ×2 IMPLANT
MARKER SKIN DUAL TIP RULER LAB (MISCELLANEOUS) ×1 IMPLANT
NDL SAFETY ECLIPSE 18X1.5 (NEEDLE) IMPLANT
NDL SPNL 18GX3.5 QUINCKE PK (NEEDLE) ×1 IMPLANT
NEEDLE HYPO 18GX1.5 SHARP (NEEDLE)
NEEDLE SPNL 18GX3.5 QUINCKE PK (NEEDLE) ×4 IMPLANT
NS IRRIG 1000ML POUR BTL (IV SOLUTION) ×2 IMPLANT
PACK GENERAL/GYN (CUSTOM PROCEDURE TRAY) ×2 IMPLANT
PENCIL SMOKE EVACUATOR (MISCELLANEOUS) ×2 IMPLANT
PIN SAFETY STERILE (MISCELLANEOUS) IMPLANT
SHEET MEDIUM DRAPE 40X70 STRL (DRAPES) ×2 IMPLANT
SLEEVE SCD COMPRESS KNEE MED (STOCKING) ×1 IMPLANT
SPONGE T-LAP 18X18 ~~LOC~~+RFID (SPONGE) ×3 IMPLANT
STAPLER INSORB 30 2030 C-SECTI (MISCELLANEOUS) ×3 IMPLANT
STAPLER VISISTAT 35W (STAPLE) ×2 IMPLANT
STRIP CLOSURE SKIN 1/2X4 (GAUZE/BANDAGES/DRESSINGS) ×4 IMPLANT
SUT CHROMIC 4 0 PS 2 18 (SUTURE) ×4 IMPLANT
SUT ETHILON 2 0 FS 18 (SUTURE) IMPLANT
SUT ETHILON 3 0 PS 1 (SUTURE) ×5 IMPLANT
SUT MNCRL AB 4-0 PS2 18 (SUTURE) ×3 IMPLANT
SUT PDS AB 2-0 CT2 27 (SUTURE) IMPLANT
SUT VIC AB 3-0 PS1 18 (SUTURE) ×4
SUT VIC AB 3-0 PS1 18XBRD (SUTURE) ×2 IMPLANT
SUT VLOC 90 P-14 23 (SUTURE) ×5 IMPLANT
SYR 50ML LL SCALE MARK (SYRINGE) ×4 IMPLANT
TAPE MEASURE VINYL STERILE (MISCELLANEOUS) IMPLANT
TOWEL GREEN STERILE FF (TOWEL DISPOSABLE) ×4 IMPLANT
TRAY FOLEY W/BAG SLVR 14FR LF (SET/KITS/TRAYS/PACK) IMPLANT
UNDERPAD 30X36 HEAVY ABSORB (UNDERPADS AND DIAPERS) ×3 IMPLANT

## 2021-09-19 NOTE — Transfer of Care (Signed)
Immediate Anesthesia Transfer of Care Note  Patient: Haley [name]  Procedure(s) Performed: Bilateral Breast Reduction (Bilateral: Breast) FREE NIPPLE GRAFT (Bilateral: Breast)  Patient Location: PACU  Anesthesia Type:General  Level of Consciousness: awake, drowsy, patient cooperative and responds to stimulation  Airway & Oxygen Therapy: Patient Spontanous Breathing and Patient connected to face mask oxygen  Post-op Assessment: Report given to RN, Post -op Vital signs reviewed and stable and Patient moving all extremities X 4  Post vital signs: Reviewed and stable  Last Vitals:  Vitals Value Taken Time  BP 174/71 09/19/21 1022  Temp    Pulse 67 09/19/21 1026  Resp 14 09/19/21 1026  SpO2 100 % 09/19/21 1026  Vitals shown include unvalidated device data.  Last Pain:  Vitals:   09/19/21 0617  TempSrc: Oral  PainSc:          Complications: No notable events documented.

## 2021-09-19 NOTE — Discharge Instructions (Addendum)
Activity As tolerated: NO showers for 3 days. Keep ACE wrap on breasts until then. After showering, put ACE wrap back on, this is important for compression. NO driving while in pain, taking pain medication or if you are unable to safely react to traffic. No heavy activities Take Pain medication (Norco) as needed for severe pain. Otherwise, you can use ibuprofen or tylenol as needed. Avoid more than 3,000 mg of tylenol in 24 hours. Norco has 325mg  of tylenol per dose.  Diet: Regular. Drink plenty of fluids and eat healthy, high protein, low carbs.  Wound Care: Keep dressing clean & dry. You may change bandages after showering if you continue to notice some drainage. You can reuse bandages if they are not dirty/soiled.  You have drains in place, they will drain directly into the gauze. We will remove these 1-2 weeks after surgery  Special Instructions: Call Doctor if any unusual problems occur such as pain, excessive Bleeding, unrelieved Nausea/vomiting, Fever &/or chills  Follow-up appointment: Scheduled for next week.

## 2021-09-19 NOTE — Anesthesia Procedure Notes (Signed)
Procedure Name: Intubation Date/Time: 09/19/2021 7:59 AM Performed by: Stanton Kidney, CRNA Pre-anesthesia Checklist: Patient identified, Emergency Drugs available, Suction available and Patient being monitored Patient Re-evaluated:Patient Re-evaluated prior to induction Oxygen Delivery Method: Circle system utilized Preoxygenation: Pre-oxygenation with 100% oxygen Induction Type: IV induction Ventilation: Mask ventilation without difficulty Laryngoscope Size: Glidescope Grade View: Grade I Tube type: Oral Tube size: 7.0 mm Number of attempts: 1 Airway Equipment and Method: Stylet and Oral airway Placement Confirmation: ETT inserted through vocal cords under direct vision, positive ETCO2 and breath sounds checked- equal and bilateral Secured at: 22 cm Tube secured with: Tape Dental Injury: Teeth and Oropharynx as per pre-operative assessment

## 2021-09-19 NOTE — Op Note (Signed)
Operative Note   DATE OF OPERATION: 09/19/2021  LOCATION: [facility]   SURGICAL DEPARTMENT: Plastic Surgery  PREOPERATIVE DIAGNOSES: Bilateral symptomatic macromastia.  POSTOPERATIVE DIAGNOSES:  same  PROCEDURE: Bilateral breast reduction with free nipple graft.  SURGEON: Ancil Linsey, MD  ASSISTANT: Zadie Cleverly, PA The advanced practice practitioner (APP) assisted throughout the case.  The APP was essential in retraction and counter traction when needed to make the case progress smoothly.  This retraction and assistance made it possible to see the tissue plans for the procedure.  The assistance was needed for blood control, tissue re-approximation and assisted with closure of the incision site.  ANESTHESIA: General.  COMPLICATIONS: None.   INDICATIONS FOR PROCEDURE:  The patient, Haley [name] is a 65 y.o. female born on 1956-09-25, is here for treatment of bilateral symptomatic macromastia. MRN: [id number]  CONSENT:  Informed consent was obtained directly from the patient. Risks, benefits and alternatives were fully discussed. Specific risks including but not limited to bleeding, infection, hematoma, seroma, scarring, pain, infection, contracture, asymmetry, wound healing problems, nipple necrosis, nipple numbness and need for further surgery were all discussed. The patient did have an ample opportunity to have questions answered to satisfaction.   DESCRIPTION OF PROCEDURE:  The patient was marked preoperatively for a Wise pattern skin excision.  The patient was taken to the operating room. SCDs were placed and antibiotics were given. General anesthesia was administered.The patient's operative site was prepped and draped in a sterile fashion. A time out was performed and all information was confirmed to be correct.  Right Breast: The breast was infiltrated with tumescent solution to help with hemostasis.  The nipple was marked with a cookie cutter and  excised in preparation for grafting.  The excision was then performed with cautery.  Hemostasis was obtained and the wound was stapled closed.  Left breast:  The breast was infiltrated with tumescent solution to help with hemostasis.  The nipple was marked with a cookie cutter and excised in preparation for grafting.  The excision was then performed with cautery.  Hemostasis was obtained and the wound was stapled closed.  Patient was then checked for size and symmetry.  Minor modifications were made.  This resulted in a total of 2029g removed from the right side and 1444g removed from the left side.  The inframammary incision was closed with a combination of buried in-sorb staples and a running 3-0 Quill suture.  The vertical and limbs were closed with interrupted buried 4-0 Monocryl and a running 4-0 Quill suture.  Nipple location was marked with a 68mm cookie cutter and deepithelialized.  Nipple grafts were then thinned out and inset with 4-0 Chromic.  Xeroform and a scrub brush foam were used to fashion and bolster that was secured with 4-0 Nylon.  Steri-Strips were then applied along with a soft dressing and Ace wrap.  The patient tolerated the procedure well.  There were no complications. The patient was allowed to wake from anesthesia, extubated and taken to the recovery room in satisfactory condition.  I was present for the entire procedure.

## 2021-09-19 NOTE — Interval H&P Note (Signed)
Patient seen and examined. Risk and benefits discussed. Proceed with surgery.

## 2021-09-19 NOTE — Anesthesia Postprocedure Evaluation (Signed)
Anesthesia Post Note  Patient: VIVIANNE CARLES  Procedure(s) Performed: Bilateral Breast Reduction (Bilateral: Breast) FREE NIPPLE GRAFT (Bilateral: Breast)     Patient location during evaluation: PACU Anesthesia Type: General Level of consciousness: awake and alert, oriented and patient cooperative Pain management: pain level controlled Vital Signs Assessment: post-procedure vital signs reviewed and stable Respiratory status: spontaneous breathing, nonlabored ventilation and respiratory function stable Cardiovascular status: blood pressure returned to baseline and stable Postop Assessment: no apparent nausea or vomiting Anesthetic complications: no   No notable events documented.  Last Vitals:  Vitals:   09/19/21 1110 09/19/21 1125  BP: (!) 167/81 (!) 184/90  Pulse: 61 (!) 59  Resp: 15 16  Temp: (!) 36.3 C (!) 36.3 C  SpO2: 95% 95%    Last Pain:  Vitals:   09/19/21 1125  TempSrc:   PainSc: 5                  Lannie Fields

## 2021-09-20 ENCOUNTER — Encounter (HOSPITAL_COMMUNITY): Payer: Self-pay | Admitting: Plastic Surgery

## 2021-09-20 ENCOUNTER — Telehealth: Payer: Self-pay

## 2021-09-20 ENCOUNTER — Ambulatory Visit (INDEPENDENT_AMBULATORY_CARE_PROVIDER_SITE_OTHER): Payer: Medicare Other | Admitting: Plastic Surgery

## 2021-09-20 ENCOUNTER — Other Ambulatory Visit: Payer: Self-pay

## 2021-09-20 DIAGNOSIS — N62 Hypertrophy of breast: Secondary | ICD-10-CM

## 2021-09-20 LAB — SURGICAL PATHOLOGY

## 2021-09-20 NOTE — Telephone Encounter (Signed)
Patient called to say that she had surgery yesterday and the bandage on her left side is rolling up under her arm.  She said it's causing friction and feels like it's burning.  Patient said her daughter can see the incision on the left side where the bandage has rolled back.  Patient said there was some bleeding last night on her left side as well.  She asked that we please call and let her know what she needs to do.

## 2021-09-20 NOTE — Telephone Encounter (Deleted)
Patient was seen in office

## 2021-09-20 NOTE — Telephone Encounter (Signed)
Patient was seen in office today. BL breast reduction was performed with Dr. Arita Miss 09/19/2021. Ace wrap had rolled down, causing friction under left arm and penrose drain site. Soiled dressings were removed and was replaced clean ones. Incision site was all intact. Rewrapped with clean ace bandages. Patient was advised to keep compression on at all times for 3 days from surgery. PO up appointment is scheduled with Matt on 09/22/2021

## 2021-09-21 NOTE — Progress Notes (Signed)
   Subjective:    Patient ID: [name], female    DOB: Mar 21, 1956, 65 y.o.   MRN: [id number]  The patient underwent a breast reduction yesterday.  She noticed drainage and bleeding today on her dressing.  She got nervous and came into the office.  The ace wrap was removed.  The penrose drains were in place.  There was no sign of hematoma, bleeding or seroma.  There was mild drainage from the penrose as expected.  Dressings were reapplied and the ace wrap placed.     Review of Systems  Constitutional:  Positive for activity change. Negative for appetite change.  Eyes: Negative.   Respiratory: Negative.    Psychiatric/Behavioral:  Positive for decreased concentration.       Objective:   Physical Exam Cardiovascular:     Rate and Rhythm: Normal rate.  Pulmonary:     Effort: Pulmonary effort is normal.  Neurological:     Mental Status: She is alert and oriented to person, place, and time.  Psychiatric:        Mood and Affect: Mood normal.        Behavior: Behavior normal.        Thought Content: Thought content normal.          Assessment & Plan:     ICD-10-CM   1. Symptomatic mammary hypertrophy  N62        Recommend sports bra or breast binder.  Call if any other concerns.

## 2021-09-22 ENCOUNTER — Other Ambulatory Visit: Payer: Self-pay

## 2021-09-22 ENCOUNTER — Ambulatory Visit (INDEPENDENT_AMBULATORY_CARE_PROVIDER_SITE_OTHER): Payer: Medicare Other | Admitting: Surgical

## 2021-09-22 DIAGNOSIS — N62 Hypertrophy of breast: Secondary | ICD-10-CM

## 2021-09-22 DIAGNOSIS — M4004 Postural kyphosis, thoracic region: Secondary | ICD-10-CM

## 2021-09-22 DIAGNOSIS — M546 Pain in thoracic spine: Secondary | ICD-10-CM

## 2021-09-22 DIAGNOSIS — M545 Low back pain, unspecified: Secondary | ICD-10-CM

## 2021-09-22 NOTE — Progress Notes (Signed)
Patient is a 65 year old female here for follow-up after bilateral breast reduction via amputation technique with Dr. Arita Miss on 09/19/2021.  She is 3 days postop.  Overall she is doing really well.  She is here today for concerns about her Ace bandages becoming undone and rolling underneath her breasts.  She has also been wearing a sports bra that she got from [employer] over top to help with compression.  She has the bolster still in place.  She is not having any infectious symptoms.  She is here with her daughter today.  Chaperone present on exam On exam bilateral nipple areolar complex bolsters are in place, bilateral breast incisions are intact.  Bilateral Penrose drains are in place, there is some serosanguineous drainage noted on the gauze.  There is no erythema or cellulitic changes noted.  No foul odors or open wounds are noted.  We discussed not getting the nipple areolar complexes wet at this time.  Recommend continue her compressive garments.  Recommend continue to change dressings as needed for the drainage.  Recommend following up at her next scheduled appointment next week on 09/28/2021 when we will plan to remove her bolsters.  No signs of infection on exam.

## 2021-09-23 DIAGNOSIS — N62 Hypertrophy of breast: Secondary | ICD-10-CM | POA: Insufficient documentation

## 2021-09-26 ENCOUNTER — Telehealth: Payer: Self-pay | Admitting: *Deleted

## 2021-09-26 NOTE — Telephone Encounter (Signed)
Received on (09/21/2021) DME Standard Written Order via of fax from [facility] [place] requesting signature and return.  Given to provider to sign.    DME Standard Written Order signed and faxed back to [facility].  Confirmation received and copy scanned into the chart.//AB/CMA

## 2021-09-28 ENCOUNTER — Ambulatory Visit (INDEPENDENT_AMBULATORY_CARE_PROVIDER_SITE_OTHER): Payer: Medicare Other | Admitting: Plastic Surgery

## 2021-09-28 ENCOUNTER — Other Ambulatory Visit: Payer: Self-pay

## 2021-09-28 DIAGNOSIS — N62 Hypertrophy of breast: Secondary | ICD-10-CM

## 2021-09-28 NOTE — Progress Notes (Signed)
Patient presents from bilateral breast reduction with free nipple graft.  She feels like things are going well.  Her back pain is quite a bit better.  The bolsters were removed from the nipple revealing adherence of the graft.  Penrose drains were removed from each side.  Skin looks to be healthy and incisions are intact.  She has symmetric shape.  We will plan to see her again in a few weeks.  All her questions were answered.

## 2021-10-05 ENCOUNTER — Ambulatory Visit (INDEPENDENT_AMBULATORY_CARE_PROVIDER_SITE_OTHER): Payer: Medicare Other | Admitting: Surgical

## 2021-10-05 ENCOUNTER — Other Ambulatory Visit: Payer: Self-pay

## 2021-10-05 DIAGNOSIS — N62 Hypertrophy of breast: Secondary | ICD-10-CM

## 2021-10-05 NOTE — Progress Notes (Signed)
Patient is a 65 year old female here for follow-up after bilateral breast reduction via free nipple graft with Dr. Arita Miss on 09/19/2021.  She is 2 weeks postop.  She is doing really well.  She reports that she is not having any pain.  She is not having any infectious symptoms.  She is very grateful and reports that "I feel like a new person"  Chaperone present on exam On exam bilateral breast incisions are intact, bilateral Penrose drain sites are healing well.  There is no cellulitic changes noted.  There is no subcutaneous fluid collections noted with palpation.  Bilateral breasts appear symmetric.  Bilateral nipple areolar complex grafts are appearing to take well, there is superficial sloughing noted of the epithelium but there is no necrosis noted. No foul odor is noted.  No drainage is noted.  Recommend continue to avoid strenuous activities.  Recommend continue to avoid heavy activities.  Recommend following up in 3 weeks for reevaluation.  Continue to wear compressive garments.  There is no sign of infection on exam.

## 2021-10-11 ENCOUNTER — Telehealth: Payer: Self-pay

## 2021-10-11 NOTE — Telephone Encounter (Signed)
Patient left voicemail to state she has noticed thick white discharge from her nipple. She also notes increased right breast pain.

## 2021-10-11 NOTE — Telephone Encounter (Addendum)
Returned patients call. BL breast reduction with free nipple graft was performed on 09/19/2021 with Dr. Arita Miss. She began to have some aches in the right breast yesterday. After taking a shower this morning, she noticed while drying off there appeared to be white thick fluid coming from her right areola. Denies any chills, nausea, vomiting, fevers, warm to tough, nor feeling full. Under the advice from [facility], this is normal process of healing, continue to use xeroform. Next follow up appointment is 10/26/21. Should anything change, or worsen to call our office. Patient understood and agreed with plan.  Patient was covered with xeroform on the right areola at time of last visit. No orders were sent to [facility]. Patient can use Vaseline and gauze for dressings

## 2021-10-17 ENCOUNTER — Telehealth: Payer: Self-pay | Admitting: Plastic Surgery

## 2021-10-17 NOTE — Telephone Encounter (Signed)
Patient called in regards to surgery done by Dr. Arita Miss. She states that she has not had any pain but when she was changing her bandages Saturday, she noticed that there was a puss type color coming from the areola. She said that it was slightly red but she just wanted to know if this is something that she should be concerned about.  Please follow up with patient.

## 2021-10-17 NOTE — Telephone Encounter (Signed)
Pt reported no fever, n/v, no odor but reported yellowish drainage coming from the areola. What pt describes sounds like exudate. Adv pt that she doesn't have any signs of infection that would require and ABX. Pt confirmed that she had a free nipple graft. Adv her to apply vaseline and guaze and keep covered. Pt is aware is that she has a post op on 12/21. Adv pt that I would send this message to see if she was able to be worked in sooner so that a provider could evaluate the area to make sure necrosis was not forming. Pt conveyed understanding.

## 2021-10-19 ENCOUNTER — Other Ambulatory Visit: Payer: Self-pay

## 2021-10-19 ENCOUNTER — Ambulatory Visit (INDEPENDENT_AMBULATORY_CARE_PROVIDER_SITE_OTHER): Payer: Medicare Other | Admitting: Surgical

## 2021-10-19 DIAGNOSIS — N62 Hypertrophy of breast: Secondary | ICD-10-CM

## 2021-10-19 NOTE — Progress Notes (Signed)
Patient is a 65 year old female here for follow-up after bilateral breast reduction via free nipple graft with Dr. Arita Miss on 09/19/2021.  She is 1 month postop.  She reports overall she is doing well.  She has continued to do Vaseline and gauze to bilateral NAC grafts.  She has noticed some of the areas of the nipple areolar complex is already starting to gallow and she is worried that this is pus and infected.  She reports that otherwise she is doing really well and is still having a lot of relief from her back pain, she is not having any breast pain.  Chaperone present on exam On exam bilateral breast incisions are intact, she does have some swelling of bilateral breasts, possible fluid wave noted of right lateral breast.  There is no overlying skin changes.  There is no drainage noted from any of the breast incisions.  Bilateral NAC grafts are well adhered and overall healing well.  The right nipple does have some scabbing.  There is some sloughing epithelium and exudate noted throughout bilateral NAC's but overall the majority of the grafts have healed well.  No erythema or cellulitic changes.   Recommend continue with Xeroform and Vaseline and gauze dressing changes daily.  Discussed ongoing restrictions.  I discussed with her that she may have a possible fluid collection of the right lateral breast, we discussed continued monitoring versus aspiration in the office today.  Patient is fearful of needles and would like to hold off on this and continue with compressive garments.  I think this is reasonable and we we will continue to have her wear compressive garments 24/7 and follow-up in 3 weeks for reevaluation.  I do not see any signs of infection on exam.

## 2021-10-26 ENCOUNTER — Ambulatory Visit: Payer: Medicare Other | Admitting: Surgical

## 2021-11-11 ENCOUNTER — Other Ambulatory Visit: Payer: Self-pay

## 2021-11-11 ENCOUNTER — Ambulatory Visit (INDEPENDENT_AMBULATORY_CARE_PROVIDER_SITE_OTHER): Payer: Medicare Other | Admitting: Surgical

## 2021-11-11 DIAGNOSIS — Z9889 Other specified postprocedural states: Secondary | ICD-10-CM

## 2021-11-11 NOTE — Progress Notes (Signed)
66 year old female here for follow up after bilateral breast reduction via free nipple graft with Dr. Claudia Desanctis on 09/19/2021.  She is 7.5 weeks postop.  Patient reports overall she is doing really well, she feels as if there has been some improvement in the swelling of her bilateral breast.  She is very pleased with her outcome from surgery, reports significant improvement in her lifestyle.  She has been doing dressing changes on bilateral nipple areola grafts, feels as if things are healing well.  Chaperone present on exam On exam bilateral breast incisions are intact, bilateral NAC grafts are well adherent and healing well.  On the right NAC graft she does have a central portion of granulation tissue still present, there is no surrounding erythema.  She is developing pigmentation throughout entire nipple areolar complex at this time.  Left NAC is well-healed, has a small scab at the 12:00 area.  There is no erythema or cellulitic changes noted.  No subcutaneous fluid collections noted with palpation.  Recommend continue to wear compressive garment for 1 more month.  She can then transition to wearing normal bras as tolerated. We discussed scheduling additional follow-up or following up as needed, patient feels comfortable following up on an as-needed basis.  Recommend continuing with Vaseline gauze to bilateral NAC's until the small wounds have completely epithelialized.  Pictures were obtained of the patient and placed in the chart with the patient's or guardian's permission.

## 2021-11-21 ENCOUNTER — Other Ambulatory Visit: Payer: Self-pay | Admitting: Internal Medicine

## 2021-11-21 DIAGNOSIS — I10 Essential (primary) hypertension: Secondary | ICD-10-CM

## 2021-11-21 MED ORDER — AMLODIPINE BESYLATE 5 MG PO TABS: 5.0000 mg | ORAL_TABLET | Freq: Every day | ORAL | 3 refills | Status: DC

## 2021-12-06 ENCOUNTER — Telehealth: Payer: Self-pay | Admitting: *Deleted

## 2021-12-06 NOTE — Telephone Encounter (Signed)
Received DME Standard Written Order Waynetta Sandy) on (12/02/21) via of fax from [facility].  Requesting signature and return.  Given to provider to sign.    DME Standard Written Order signed and faxed back to [facility].  Confirmation received and copy scanned into the chart.//AB/CMA

## 2021-12-14 ENCOUNTER — Telehealth: Payer: Self-pay | Admitting: Cardiology

## 2021-12-14 NOTE — Telephone Encounter (Signed)
Pt c/o medication issue:  1. Name of Medication:  ranolazine (RANEXA) 500 MG 12 hr tablet  2. How are you currently taking this medication (dosage and times per day)? Not currently taking   3. Are you having a reaction (difficulty breathing--STAT)? No   4. What is your medication issue? Patient called in to scheduled her recall f/u with Dr. Shari Prows. Patient wanted it noted in her chart she never started taking the Ranex prescribed at her previous appointment. She reports she picked up the medication and reviewed the side effects causing her to not take it. Patient states palpitations causing this to be prescribed are almost nonexistent.

## 2021-12-14 NOTE — Telephone Encounter (Signed)
Pt just wanted to let Dr. Shari Prows and myself know that she never proceeded with taking prescribed Ranexa 500 mg BID, as prescribed at last OV in July.  Pt states she read up on side effects of this medication and decided not to proceed with taking it.  She states she has made several lifestyle changes, including exercising more and decreasing her caffeine intake, and reports this had made a tremendous difference with her previous cardiac complaints. Pt states she has no complaints of chest pain or palpitations anymore since making healthy lifestyle changes.  Pt just wanted to make [place] aware of this.  Informed the pt that Ranexa has been taken off her med list.  Advised her to continue all her other cardiac meds and healthy lifestyle.  Informed her we will see her as scheduled in April, and I will pass the good news along to Dr. Shari Prows. Pt verbalized understanding and agrees with this plan.  Pt was more than gracious for all the assistance provided.

## 2021-12-22 ENCOUNTER — Other Ambulatory Visit: Payer: Self-pay | Admitting: Internal Medicine

## 2022-01-28 NOTE — Progress Notes (Deleted)
? ?Cardiology Office Note:   ? ?Date:  01/28/2022  ? ?ID:  Haley [name], DOB 11/07/1955, MRN [id number] ? ?PCP:  Mack Hook, MD ?  ?[facility] Providers ?Cardiologist:  Freada Bergeron, MD { ? ? ?Referring MD: Mack Hook, MD  ? ? ? ?History of Present Illness:   ? ?Haley [name] is a 66 y.o. female with a hx of nonobstructive CAD on cath in 2017, DMII, HTN, and HLD who was previously followed by Dr. Meda Coffee who now presents to clinic for follow-up. ? ?Per review of the record, patient underwent coronary CTA in 2017 with calcium score of 239 which was 93 percentile, moderate diffuse CAD possible obstructive.  Cardiac catheterization 10/2016 mild nonobstructive disease in the LAD, moderate stenosis in the mid to distal PDA 50%. LV function normal. Medical therapy recommended.  Saw Dr. Meda Coffee on 04/23/20 with SOB and chest pressure. Underwent repeat LHC on 05/04/20 which showed nonobstructive CAD similar to prior.  ? ?Patient was last seen in clinic 05/2021 where she was doing well from a CV standpoint. ? ?Today, *** ? ?Past Medical History:  ?Diagnosis Date  ? Back pain   ? Coronary artery disease   ? Depression   ? DM type 2, controlled, with complication ([place]) 99991111  ? Type II DM diagnosed in ~ 2006 per pt hx.   ? Environmental and seasonal allergies   ? Ganglion cyst of wrist, right 05/09/2017  ? Hyperlipidemia   ? Hypertension   ? Itching   ? Refusal of blood transfusions as patient is Jehovah's Witness   ? Shingles   ? Stroke [facility]) 2000  ? per pt. report:   TIA  ? ? ?Past Surgical History:  ?Procedure Laterality Date  ? ABDOMINAL HYSTERECTOMY  1994  ? Laparoscopic; ovaries still intact.  Performed for prolapsed uterus.  ? AREOLA/NIPPLE RECONSTRUCTION WITH GRAFT Bilateral 09/19/2021  ? Procedure: FREE NIPPLE GRAFT;  Surgeon: Cindra Presume, MD;  Location: [place];  Service: Plastics;  Laterality: Bilateral;  ? BREAST REDUCTION SURGERY Bilateral 09/19/2021  ? Procedure: Bilateral  Breast Reduction;  Surgeon: Cindra Presume, MD;  Location: [place];  Service: Plastics;  Laterality: Bilateral;  2 hours  ? CARDIAC CATHETERIZATION N/A 10/23/2016  ? Procedure: Left Heart Cath and Coronary Angiography;  Surgeon: Burnell Blanks, MD;  Location: [place] CV [facility];  Service: Cardiovascular;  Laterality: N/A;  ? CHOLECYSTECTOMY  1997  ? Laparoscopic  ? COLONOSCOPY  78-at age 30  ? Hyperplastic polyp--[place]  ? LEFT HEART CATH AND CORONARY ANGIOGRAPHY N/A 05/04/2020  ? Procedure: LEFT HEART CATH AND CORONARY ANGIOGRAPHY;  Surgeon: Martinique, Peter M, MD;  Location: [place] [facility];  Service: Cardiovascular;  Laterality: N/A;  ? WISDOM TOOTH EXTRACTION    ? ? ?Current Medications: ?No outpatient medications have been marked as taking for the 01/30/22 encounter (Appointment) with Freada Bergeron, MD.  ?  ? ?Allergies:   Praluent [alirocumab], Zetia [ezetimibe], Atorvastatin, and Pravastatin  ? ?Social History  ? ?Socioeconomic History  ? Marital status: Single  ?  Spouse name: Not on file  ? Number of children: 2  ? Years of education: 12+  ? Highest education level: Some college, no degree  ?Occupational History  ? Occupation: Environmental health practitioner  ?  Comment: Retired now  ?Tobacco Use  ? Smoking status: Never  ? Smokeless tobacco: Never  ?Vaping Use  ? Vaping Use: Never used  ?Substance and Sexual Activity  ? Alcohol use:  Yes  ?  Alcohol/week: 0.0 standard drinks  ?  Comment: occasionally-1 to 2 drinks monthly   ? Drug use: No  ? Sexual activity: Not Currently  ?  Birth control/protection: Surgical  ?Other Topics Concern  ? Not on file  ?Social History Narrative  ? Did get some post high school training/education  ? Lives alone  ? Watches 2 great grandchildren frequently  ? Daughter lives in [place] and checks in regularly.  ? ?Social Determinants of Health  ? ?Financial Resource Strain: Low Risk   ? Difficulty of Paying Living Expenses: Not hard at all  ?Food Insecurity: No Food Insecurity   ? Worried About Charity fundraiser in the Last Year: Never true  ? Ran Out of Food in the Last Year: Never true  ?Transportation Needs: No Transportation Needs  ? Lack of Transportation (Medical): No  ? Lack of Transportation (Non-Medical): No  ?Physical Activity: Not on file  ?Stress: Not on file  ?Social Connections: Not on file  ?  ? ?Family History: ?The patient's family history includes Cancer (age of onset: 67) in her sister; Diabetes in her brother, father, sister, and son; Hypertension in her brother and son; Seizures in her brother; Stroke in her maternal aunt. ? ?ROS:   ?Please see the history of present illness.    ?Review of Systems  ?Constitutional:  Negative for chills and fever.  ?HENT:  Negative for sore throat.   ?Eyes:  Negative for blurred vision.  ?Respiratory:  Negative for shortness of breath.   ?Cardiovascular:  Positive for palpitations. Negative for chest pain, orthopnea, claudication, leg swelling and PND.  ?Gastrointestinal:  Negative for nausea and vomiting.  ?Genitourinary:  Negative for urgency.  ?Musculoskeletal:  Negative for falls.  ?Neurological:  Negative for dizziness and loss of consciousness.  ?Psychiatric/Behavioral:  Negative for substance abuse.    ? ?EKGs/Labs/Other Studies Reviewed:   ? ?The following studies were reviewed today: ?2Decho 05/12/20 ?IMPRESSIONS  ? 1. Left ventricular ejection fraction, by estimation, is 60 to 65%. The  ?left ventricle has normal function. The left ventricle has no regional  ?wall motion abnormalities. There is mild concentric left ventricular  ?hypertrophy. Left ventricular diastolic  ?parameters are indeterminate.  ? 2. Right ventricular systolic function is normal. The right ventricular  ?size is normal. Tricuspid regurgitation signal is inadequate for assessing  ?PA pressure.  ? 3. The mitral valve is normal in structure. Trivial mitral valve  ?regurgitation. No evidence of mitral stenosis.  ? 4. The aortic valve is tricuspid. Aortic valve  regurgitation is not  ?visualized. No aortic stenosis is present.  ? 5. The inferior vena cava is normal in size with greater than 50%  ?respiratory variability, suggesting right atrial pressure of 3 mmHg.  ? ?Comparison(s): No significant change from prior study.  ? ?Conclusion(s)/Recommendation(s): Normal biventricular function without  ?evidence of hemodynamically significant valvular heart disease. ?Cardiac Cath 05/04/20 ?Ost LAD to Prox LAD lesion is 30% stenosed. ?RPDA lesion is 50% stenosed. ?The left ventricular systolic function is normal. ?LV end diastolic pressure is mildly elevated. ?The left ventricular ejection fraction is 55-65% by visual estimate. ?  ?1. Nonobstructive CAD. Unchanged from 2017. ?2. Normal LV function ?3. Mildly elevated LVEDP 20 mm Hg ?  ?Plan: medical management.  ?  ?  ?Coronary CTA ?IMPRESSION: ?1. Coronary calcium score of 239. This was 51 percentile for age and ?sex matched control. ?  ?2. Normal coronary origin with right dominance. ?  ?3. Moderate diffuse  CAD, possibly obstructive. We will send ?additional analysis for CT FFT evaluation once available. ?  ?4. Mildly dilated pulmonary artery measuring 31 x 27 mm suspicious ?for pulmonary hypertension. ?  ? ?EKG:  *** ? ?Recent Labs: ?08/26/2021: ALT 20; BUN 13; Creatinine, Ser 1.11; Potassium 3.9; Sodium 142 ?09/14/2021: Hemoglobin 13.9; Platelets 240  ?Recent Lipid Panel ?   ?Component Value Date/Time  ? CHOL 132 08/26/2021 0932  ? TRIG 132 08/26/2021 0932  ? HDL 41 08/26/2021 0932  ? [place] 68 08/26/2021 0932  ? LDLDIRECT 134 (H) 07/22/2013 1620  ? ? ? ?    ? ?Physical Exam:   ? ?VS:  There were no vitals taken for this visit.   ? ?Wt Readings from Last 3 Encounters:  ?09/19/21 227 lb 12.8 oz (103.3 kg)  ?09/14/21 227 lb 12.8 oz (103.3 kg)  ?08/31/21 233 lb (105.7 kg)  ?  ? ?GEN:  Well nourished, well developed in no acute distress ?HEENT: Normal ?NECK: No JVD; No carotid bruits ?CARDIAC: RRR, 1/6 systolic murmur. No rubs,  gallops ?RESPIRATORY:  Clear to auscultation without rales, wheezing or rhonchi  ?ABDOMEN: Soft, non-tender, non-distended ?MUSCULOSKELETAL:  No edema; No deformity  ?SKIN: Warm and dry ?NEUROLOGIC:  [place]

## 2022-01-30 ENCOUNTER — Encounter: Payer: Self-pay | Admitting: Cardiology

## 2022-01-30 ENCOUNTER — Other Ambulatory Visit: Payer: Self-pay

## 2022-01-30 ENCOUNTER — Ambulatory Visit (INDEPENDENT_AMBULATORY_CARE_PROVIDER_SITE_OTHER): Payer: Medicare Other | Admitting: Cardiology

## 2022-01-30 VITALS — BP 154/90 | HR 64 | Ht 61.0 in | Wt 217.4 lb

## 2022-01-30 DIAGNOSIS — I10 Essential (primary) hypertension: Secondary | ICD-10-CM | POA: Diagnosis not present

## 2022-01-30 DIAGNOSIS — Z6841 Body Mass Index (BMI) 40.0 and over, adult: Secondary | ICD-10-CM

## 2022-01-30 DIAGNOSIS — E119 Type 2 diabetes mellitus without complications: Secondary | ICD-10-CM | POA: Diagnosis not present

## 2022-01-30 DIAGNOSIS — I2510 Atherosclerotic heart disease of native coronary artery without angina pectoris: Secondary | ICD-10-CM

## 2022-01-30 DIAGNOSIS — R002 Palpitations: Secondary | ICD-10-CM

## 2022-01-30 DIAGNOSIS — E782 Mixed hyperlipidemia: Secondary | ICD-10-CM

## 2022-01-30 NOTE — Patient Instructions (Signed)

## 2022-01-30 NOTE — Progress Notes (Signed)
? ?Cardiology Office Note:   ? ?Date:  01/30/2022  ? ?ID:  Haley [name], DOB 11-18-1955, MRN [id number] ? ?PCP:  Julieanne Manson, MD ?  ?[facility] Providers ?Cardiologist:  Meriam Sprague, MD { ? ? ?Referring MD: Julieanne Manson, MD  ? ?History of Present Illness:   ? ?Haley [name] is a 65 y.o. female with a hx of nonobstructive CAD on cath in 2017, DMII, HTN, and HLD who was previously followed by Dr. Delton See who now presents to clinic for follow-up. ? ?Per review of the record, patient underwent coronary CTA in 2017 with calcium score of 239 which was 93 percentile, moderate diffuse CAD possible obstructive.  Cardiac catheterization 10/2016 mild nonobstructive disease in the LAD, moderate stenosis in the mid to distal PDA 50%. LV function normal. Medical therapy recommended.  Saw Dr. Delton See on 04/23/20 with SOB and chest pressure. Underwent repeat LHC on 05/04/20 which showed nonobstructive CAD similar to prior.  ? ?Patient was last seen in clinic 05/2021 where she was doing well from a CV standpoint. ? ?Today, she is doing well. Her blood pressure at home ranges from 120s to 130s systolic. She has not noticed recent palpitations or chest pain since switching from coffee to chicory root beverage. She recently joined a gym. She wonders if she could be taken off of a beta-blocker. She is open to starting a GLP1RA. However, she is tolerating all her medications.  ? ?Past Medical History:  ?Diagnosis Date  ? Back pain   ? Coronary artery disease   ? Depression   ? DM type 2, controlled, with complication ([facility]) 07/22/2013  ? Type II DM diagnosed in ~ 2006 per pt hx.   ? Environmental and seasonal allergies   ? Ganglion cyst of wrist, right 05/09/2017  ? Hyperlipidemia   ? Hypertension   ? Itching   ? Refusal of blood transfusions as patient is Jehovah's Witness   ? Shingles   ? Stroke [facility]) 2000  ? per pt. report:   TIA  ? ? ?Past Surgical History:  ?Procedure Laterality Date  ? ABDOMINAL  HYSTERECTOMY  1994  ? Laparoscopic; ovaries still intact.  Performed for prolapsed uterus.  ? AREOLA/NIPPLE RECONSTRUCTION WITH GRAFT Bilateral 09/19/2021  ? Procedure: FREE NIPPLE GRAFT;  Surgeon: Allena Napoleon, MD;  [facility];  Service: Plastics;  Laterality: Bilateral;  ? BREAST REDUCTION SURGERY Bilateral 09/19/2021  ? Procedure: Bilateral Breast Reduction;  Surgeon: Allena Napoleon, MD;  Location[facility] OR;  Service: Plastics;  Laterality: Bilateral;  2 hours  ? CARDIAC CATHETERIZATION N/A 10/23/2016  ? Procedure: Left Heart Cath and Coronary Angiography;  Surgeon: Kathleene Hazel, MD;  Location: [facility] INVASIVE CV LAB;  Service: Cardiovascular;  Laterality: N/A;  ? CHOLECYSTECTOMY  1997  ? Laparoscopic  ? COLONOSCOPY  2005-at age 35  ? Hyperplastic polyp--[place]  ? LEFT HEART CATH AND CORONARY ANGIOGRAPHY N/A 05/04/2020  ? Procedure: LEFT HEART CATH AND CORONARY ANGIOGRAPHY;  Surgeon: [place], Peter M, MD;  [facility] INVASIVE CV LAB;  Service: Cardiovascular;  Laterality: N/A;  ? WISDOM TOOTH EXTRACTION    ? ? ?Current Medications: ?Current Meds  ?Medication Sig  ? amLODipine (NORVASC) 5 MG tablet Take 1 tablet (5 mg total) by mouth daily.  ? aspirin 81 MG tablet Take 81 mg by mouth daily.  ? Cholecalciferol (DIALYVITE VITAMIN D 5000) 125 MCG (5000 UT) capsule Take 15,000 Units by mouth daily.  ? Cyanocobalamin (B-12 PO) Take 1 tablet  by mouth daily.  ? EQ ALLERGY RELIEF, CETIRIZINE, 10 MG tablet Take 1 tablet by mouth once daily  ? GARLIC PO Take 1,3246,000 capsules by mouth daily.  ? glimepiride (AMARYL) 2 MG tablet 1 1/2 tabs by mouth once daily with breakfast  ? ibuprofen (ADVIL) 200 MG tablet 2-4 tabs by mouth twice daily with meals as needed  ? Liniments (BLUE-EMU SUPER STRENGTH EX) Apply 1 application topically daily as needed (pain).  ? lisinopril (ZESTRIL) 40 MG tablet Take 1 tablet by mouth once daily  ? metoprolol succinate (TOPROL-XL) 50 MG 24 hr tablet Take 1 tablet (50 mg total) by mouth  daily. Take with or immediately following a meal.  ? mometasone (NASONEX) 50 MCG/ACT nasal spray USE 2 SPRAY(S) IN EACH NOSTRIL ONCE DAILY  ? Multiple Vitamins-Minerals (MULTIVITAMIN WITH MINERALS) tablet Take 1 tablet by mouth daily.  ? nitroGLYCERIN (NITROSTAT) 0.4 MG SL tablet DISSOLVE ONE TABLET UNDER THE TONGUE EVERY 5 MINUTES AS NEEDED FOR CHEST PAIN.  DO NOT EXCEED A TOTAL OF 3 DOSES IN 15 MINUTES  ? omega-3 acid ethyl esters (LOVAZA) 1 g capsule Take 1 capsule (1 g total) by mouth 2 (two) times daily.  ? rosuvastatin (CRESTOR) 10 MG tablet Take 1 tablet (10 mg total) by mouth daily.  ? vitamin C (ASCORBIC ACID) 500 MG tablet Take 1,000 mg by mouth daily.  ? zinc gluconate 50 MG tablet Take 50 mg by mouth daily.  ?  ? ?Allergies:   Praluent [alirocumab], Zetia [ezetimibe], Atorvastatin, and Pravastatin  ? ?Social History  ? ?Socioeconomic History  ? Marital status: Single  ?  Spouse name: Not on file  ? Number of children: 2  ? Years of education: 12+  ? Highest education level: Some college, no degree  ?Occupational History  ? Occupation: Print production plannerLeasing Agent  ?  Comment: Retired now  ?Tobacco Use  ? Smoking status: Never  ? Smokeless tobacco: Never  ?Vaping Use  ? Vaping Use: Never used  ?Substance and Sexual Activity  ? Alcohol use: Yes  ?  Alcohol/week: 0.0 standard drinks  ?  Comment: occasionally-1 to 2 drinks monthly   ? Drug use: No  ? Sexual activity: Not Currently  ?  Birth control/protection: Surgical  ?Other Topics Concern  ? Not on file  ?Social History Narrative  ? Did get some post high school training/education  ? Lives alone  ? Watches 2 great grandchildren frequently  ? [place]aughter lives in [place] and checks in regularly.  ? ?Social Determinants of Health  ? ?Financial Resource Strain: Low Risk   ? Difficulty of Paying Living Expenses: Not hard at all  ?Food Insecurity: No Food Insecurity  ? Worried About Programme researcher, broadcasting/film/videounning Out of Food in the Last Year: Never true  ? Ran Out of Food in the Last Year: Never  true  ?Transportation Needs: No Transportation Needs  ? Lack of Transportation (Medical): No  ? Lack of Transportation (Non-Medical): No  ?Physical Activity: Not on file  ?Stress: Not on file  ?Social Connections: Not on file  ?  ? ?Family History: ?The patient's family history includes Cancer (age of onset: 4640) in her sister; Diabetes in her brother, father, sister, and son; Hypertension in her brother and son; Seizures in her brother; Stroke in her maternal aunt. ? ?ROS:   ?Please see the history of present illness.    ?Review of Systems  ?Constitutional:  Negative for chills and fever.  ?HENT:  Negative for sore throat.   ?Eyes:  Negative for blurred vision.  ?Respiratory:  Negative for shortness of breath.   ?Cardiovascular:  Negative for chest pain, palpitations, orthopnea, claudication, leg swelling and PND.  ?Gastrointestinal:  Negative for nausea and vomiting.  ?Genitourinary:  Negative for urgency.  ?Musculoskeletal:  Negative for falls.  ?Skin:  Negative for itching and rash.  ?Neurological:  Negative for dizziness and loss of consciousness.  ?Endo/Heme/Allergies:  Does not bruise/bleed easily.  ?Psychiatric/Behavioral:  Negative for substance abuse.    ? ?EKGs/Labs/Other Studies Reviewed:   ? ?The following studies were reviewed today: ?2D-Echo 05/12/20 ?IMPRESSIONS  ? 1. Left ventricular ejection fraction, by estimation, is 60 to 65%. The left ventricle has normal function. The left ventricle has no regional wall motion abnormalities. There is mild concentric left ventricular hypertrophy. Left ventricular diastolic parameters are indeterminate.  ? 2. Right ventricular systolic function is normal. The right ventricular size is normal. Tricuspid regurgitation signal is inadequate for assessing PA pressure.  ? 3. The mitral valve is normal in structure. Trivial mitral valve  ?regurgitation. No evidence of mitral stenosis.  ? 4. The aortic valve is tricuspid. Aortic valve regurgitation is not visualized. No  aortic stenosis is present.  ? 5. The inferior vena cava is normal in size with greater than 50% respiratory variability, suggesting right atrial pressure of 3 mmHg.  ? ?Comparison(s): No significant change fr

## 2022-01-31 ENCOUNTER — Telehealth: Payer: Self-pay | Admitting: Student-PharmD

## 2022-01-31 MED ORDER — MOUNJARO 2.5 MG/0.5ML ~~LOC~~ SOAJ: 2.5000 mg | SUBCUTANEOUS | 0 refills | Status: DC

## 2022-01-31 NOTE — Telephone Encounter (Signed)
Received referral from Dr. Shari Prows to start semaglutide for this patient for weight loss. They meet FDA approved criteria for semaglutide for use in obesity given BMI 41.08. Obesity is complicated by chronic conditions including T2DM, HLD, HTN, nonobstructive CAD on cath with elevated calcium score. No contraindications seen in the chart to [facility] use. S/p cholecystectomy in 1997.  ? ?Patient has T2DM (last A1c 6.5) which means Ozempic should be covered and would benefit both DM and weight. She also has Medicaid which should make the cost affordable. Looked up [facility] Medicare formulary and Ozempic and Mounjaro are on formulary without a PA.  ? ?Called the patient to discuss interest in starting one of these medications. Explained administration, storage, and possible adverse effects. She is interested in giving it a try to help with weight loss and blood sugars. Discussed the difference in Ozempic and Mounjaro pens [facility] being a single-use auto-injector, do not have to attach needles), and she prefers to try [facility] since she would rather not see the needle. Sent Rx to her pharmacy for 2.5 mg pens. Discussed cost. She will pick up the medication and store it in her fridge until appt with pharmacist on 02/23/22 at [facility] office. Will plan to discontinue glimepiride at that time considering A1c 6.5 and glimepiride will increase risk of hypoglycemia with Mounjaro start.  ?

## 2022-02-13 ENCOUNTER — Telehealth: Payer: Self-pay

## 2022-02-13 NOTE — Telephone Encounter (Signed)
Patient would like recommendations for stomach issue she has had for about 2-3 weeks. She has had a fullness sensation even if she has not eaten much. Believes she has less bowel movement and never feels that her bowels are not entirely empty. Has tried laxative, which has not helped overall. Has also tried metamucil which has not helped.  ?

## 2022-02-22 ENCOUNTER — Ambulatory Visit: Payer: Medicare Other | Admitting: Cardiology

## 2022-02-22 NOTE — Progress Notes (Signed)
Patient ID: Haley [name]                 DOB: May 12, 1956                    MRN: [id number] ? ? ? ? ?HPI: ?Haley [name] is a 66 y.o. female patient referred to pharmacy clinic by Dr. Johney Frame to initiate weight loss therapy with GLP1-RA. PMH is significant for obesity, T2DM, HLD, HTN, nonobstructive CAD. Most recent BMI 41.08. Pt underwent coronary CTA in 2017 with calcium score of 239 (93rd percentile for sex, age, race) and moderate diffuse CAD. Cardiac catheterization 10/2016 showed mild nonobstructive disease in the LAD, moderate stenosis in the mid to distal PDA 50%. LV function normal.  ? ?Pt able to start [place] as she has diabetes and her insurance has Ozempic and Mounjaro on formulary. PharmD called pt on 01/31/22 to discuss GLP1RA administration, storage, and possible adverse effects along with the differences between Ozempic and Mounjaro. Pt preferred Mounjaro since she would rather not see the needle. Rx was sent to pharmacy for 2.5 mg pens and pt stated she would pick them up and store them in her fridge until appt with pharmacist on 02/23/22. Discussed plan to discontinue glimepiride at that time considering A1c is 6.5 and glimepiride will increase risk of hypoglycemia with Mounjaro start and also contributes to weight gain. ? ?Pt presents today for GLP-1 education and initiation. Pt is hesitant about the injection because she does not like needles, but does want to give herself the first dose today. States she does have constipation at baseline and wants to know if this will cause more. ? ?Current weight management medications: None ? ?Previously tried meds: None ? ?Current meds that may affect weight: metoprolol succinate, glimepiride ? ?Baseline weight/BMI: 217.4 (41.08) ? ?Insurance payor: [employer] ? ?Diet:  ?-Breakfast: Sourdough muffin with sausage and eggs ?-Lunch: Skip lunch a lot. Sandwich - sliced [place] with lettuce, tomato, cheese + fruit ?-Dinner: Meat - bakes  chicken, fish, pork chop, [place]. Eats a lot of green vegetables. Does not fry a lot of food ?-Snacks: Does not snack a lot. Peanut butter + crackers or apple. Will rarely eat a piece of cake and ice cream ?-Drinks: Does not drink a lot of water. Drinks lemonade, carbonated water, smoothies. Drinks Chicory (herbal substitute for coffee) ? ?Exercise: Recently joined a gym. Does want to exercise more  ? ?Family History: The patient's family history includes Cancer (age of onset: 75) in her sister; Diabetes in her brother, father, sister, and son; Hypertension in her brother and son; Seizures in her brother; Stroke in her maternal aunt. ? ?Social History: Denies tobacco and illicit drug use. Reports occasional alcohol use (1-2 times per month) ? ?Labs: ?Lab Results  ?Component Value Date  ? HGBA1C 6.5 (H) 08/26/2021  ? ? ?Wt Readings from Last 1 Encounters:  ?01/30/22 217 lb 6.4 oz (98.6 kg)  ? ? ?BP Readings from Last 1 Encounters:  ?01/30/22 (!) 154/90  ? ?Pulse Readings from Last 1 Encounters:  ?01/30/22 64  ? ? ?   ?Component Value Date/Time  ? CHOL 132 08/26/2021 0932  ? TRIG 132 08/26/2021 0932  ? HDL 41 08/26/2021 0932  ? [place] 68 08/26/2021 0932  ? LDLDIRECT 134 (H) 07/22/2013 1620  ? ? ?Past Medical History:  ?Diagnosis Date  ? Back pain   ? Coronary artery disease   ? Depression   ? DM type 2, controlled,  with complication ([place]) 80/16/5537  ? Type II DM diagnosed in ~ 2006 per pt hx.   ? Environmental and seasonal allergies   ? Ganglion cyst of wrist, right 05/09/2017  ? Hyperlipidemia   ? Hypertension   ? Itching   ? Refusal of blood transfusions as patient is Jehovah's Witness   ? Shingles   ? Stroke [facility]) 2000  ? per pt. report:   TIA  ? ? ?Current Outpatient Medications on File Prior to Visit  ?Medication Sig Dispense Refill  ? amLODipine (NORVASC) 5 MG tablet Take 1 tablet (5 mg total) by mouth daily. 90 tablet 3  ? aspirin 81 MG tablet Take 81 mg by mouth daily.    ? Cholecalciferol (DIALYVITE VITAMIN  D 5000) 125 MCG (5000 UT) capsule Take 15,000 Units by mouth daily.    ? Cyanocobalamin (B-12 PO) Take 1 tablet by mouth daily.    ? EQ ALLERGY RELIEF, CETIRIZINE, 10 MG tablet Take 1 tablet by mouth once daily 90 tablet 2  ? GARLIC PO Take 4,827 capsules by mouth daily.    ? glimepiride (AMARYL) 2 MG tablet 1 1/2 tabs by mouth once daily with breakfast 135 tablet 3  ? ibuprofen (ADVIL) 200 MG tablet 2-4 tabs by mouth twice daily with meals as needed 30 tablet 0  ? Liniments (BLUE-EMU SUPER STRENGTH EX) Apply 1 application topically daily as needed (pain).    ? lisinopril (ZESTRIL) 40 MG tablet Take 1 tablet by mouth once daily 90 tablet 3  ? metoprolol succinate (TOPROL-XL) 50 MG 24 hr tablet Take 1 tablet (50 mg total) by mouth daily. Take with or immediately following a meal. 90 tablet 3  ? mometasone (NASONEX) 50 MCG/ACT nasal spray USE 2 SPRAY(S) IN EACH NOSTRIL ONCE DAILY 51 g 3  ? Multiple Vitamins-Minerals (MULTIVITAMIN WITH MINERALS) tablet Take 1 tablet by mouth daily.    ? nitroGLYCERIN (NITROSTAT) 0.4 MG SL tablet DISSOLVE ONE TABLET UNDER THE TONGUE EVERY 5 MINUTES AS NEEDED FOR CHEST PAIN.  DO NOT EXCEED A TOTAL OF 3 DOSES IN 15 MINUTES 25 tablet 1  ? omega-3 acid ethyl esters (LOVAZA) 1 g capsule Take 1 capsule (1 g total) by mouth 2 (two) times daily. 180 capsule 3  ? rosuvastatin (CRESTOR) 10 MG tablet Take 1 tablet (10 mg total) by mouth daily. 90 tablet 3  ? tirzepatide [facility]) 2.5 MG/0.5ML Pen Inject 2.5 mg into the skin once a week. 2 mL 0  ? vitamin C (ASCORBIC ACID) 500 MG tablet Take 1,000 mg by mouth daily.    ? zinc gluconate 50 MG tablet Take 50 mg by mouth daily.    ? ?No current facility-administered medications on file prior to visit.  ? ? ?Allergies  ?Allergen Reactions  ? Praluent [Alirocumab]   ?  Had significant fatigue and memory loss--was lost driving in town.  Joint pain and nausea as well.  ? Zetia [Ezetimibe] Other (See Comments)  ?  Joint pain and ear ringing  ?  Atorvastatin Other (See Comments)  ?  Pt states causes bilateral lower extremity muscle cramps  ? Pravastatin Nausea And Vomiting and Other (See Comments)  ?  Causes dizziness  ? ?Assessment/Plan: ? ?1. Weight loss - Patient has not met goal of at least 5% of body weight loss with comprehensive lifestyle modifications alone in the past 3-6 months. Pharmacotherapy is appropriate to pursue as augmentation. Will start Mounjaro 2.5 mg SQ once weekly. Will stop glimepiride (contributes to hypoglycemia and  weight gain and A1c most recently 6.5%).  ? ?Advised patient on common side effects including nausea, diarrhea, dyspepsia, decreased appetite, and fatigue. Counseled patient on reducing meal size and how to titrate medication to minimize side effects. Counseled patient to call if intolerable side effects or if experiencing dehydration, abdominal pain, or dizziness. Patient will adhere to dietary modifications and will target at least 150 minutes of moderate intensity exercise weekly.  ? ?Injection technique reviewed at today's visit and patient successfully self-administered first dose of Mounjaro into the fatty tissue of the upper thigh. Did inform pt to keep an eye on her constipation and to give [place] a call if it gets worse after starting Mounjaro. Discussed drinking more water can help mitigate some of the constipation or GI upset she may experience. ? ?Titration Plan:  ?Will plan to follow the titration plan as below, pending patient is tolerating each dose before increasing to the next. Can slow titration if needed for tolerability.  ?  ?-Month 1: Inject Mounjaro 2.5 mg subcutaneously once weekly x 4 weeks ?-Month 2: Inject Mounjaro 5 mg subcutaneously once weekly x 4 weeks ?-Month 3: Inject Mounjaro 7.5 mg subcutaneously once weekly x 4 weeks ?-Month 4: Inject Mounjaro 10 mg subcutaneously once weekly x 4 weeks ?-Month 5: Inject Mounjaro 12.5 mg subcutaneously once weekly x 4 weeks ?-Month 6+: Inject Mounjaro 15 mg  subcutaneously once weekly x 4 weeks ? ?Follow up in one month over the phone for tolerability update and further dose titration. ? ?Patient seen with Debria Garret, [place] pharmacy student  ? ?Megan E. Supple, PharmD, [facility]

## 2022-02-23 ENCOUNTER — Ambulatory Visit (INDEPENDENT_AMBULATORY_CARE_PROVIDER_SITE_OTHER): Payer: Medicare Other | Admitting: Pharmacist

## 2022-02-23 DIAGNOSIS — Z6841 Body Mass Index (BMI) 40.0 and over, adult: Secondary | ICD-10-CM | POA: Diagnosis not present

## 2022-02-23 DIAGNOSIS — E118 Type 2 diabetes mellitus with unspecified complications: Secondary | ICD-10-CM | POA: Diagnosis not present

## 2022-02-23 NOTE — Patient Instructions (Signed)
Mounjaro Counseling Points ?This medication reduces your appetite and may make you feel fuller longer.  ?Stop eating when your body tells you that you are full. This will likely happen sooner than you are used to. ?Store your medication in the fridge until you are ready to use it. ?Inject your medication in the fatty tissue of your lower abdominal area (2 inches away from belly button) or upper outer thigh. Rotate injection sites. ?Common side effects include: nausea, diarrhea/constipation, and heartburn, and are more likely to occur if you overeat. ? ?Dosing schedule: ?- Month 1: Inject 2.5 mg subcutaneously once weekly for 4 weeks ?Will call once a month and increase your dose as tolerated ?Stop glimepiride ? ?Tips for living a healthier life ? ? ? ? ?Building a Naval architect Diet ?Make most of your meal vegetables and fruits - ? of your plate. ?Aim for color and variety, and remember that potatoes don?t count as vegetables on the Healthy Eating Plate because of their negative impact on blood sugar. ? ?Go for whole grains - ? of your plate. ?Whole and intact grains--whole wheat, barley, wheat berries, quinoa, oats, brown rice, and foods made with them, such as whole wheat pasta--have a milder effect on blood sugar and insulin than white bread, white rice, and other refined grains. ? ?Protein power - ? of your plate. ?Fish, poultry, beans, and nuts are all healthy, versatile protein sources--they can be mixed into salads, and pair well with vegetables on a plate. Limit red meat, and avoid processed meats such as bacon and sausage. ? ?Healthy plant oils - in moderation. ?Choose healthy vegetable oils like olive, canola, soy, corn, sunflower, peanut, and others, and avoid partially hydrogenated oils, which contain unhealthy trans fats. Remember that low-fat does not mean ?healthy.? ? ?Drink water, coffee, or tea. ?Skip sugary drinks, limit milk and dairy products to one to two servings per day, and limit juice  to a small glass per day. ? ?Stay active. ?The red figure running across the [place] is a reminder that staying active is also important in weight control. ? ?The main message of the Healthy Eating Plate is to focus on diet quality: ? ?The type of carbohydrate in the diet is more important than the amount of carbohydrate in the diet, because some sources of carbohydrate--like vegetables (other than potatoes), fruits, whole grains, and beans--are healthier than others. ?The Healthy Eating Plate also advises consumers to avoid sugary beverages, a major source of calories--usually with little nutritional value--in the American diet. ?The Healthy Eating Plate encourages consumers to use healthy oils, and it does not set a maximum on the percentage of calories people should get each day from healthy sources of fat. In this way, the Healthy Eating Plate recommends the opposite of the low-fat message promoted for decades by the USDA. ? ?[email or web address].no ? ?SUGAR ? ?Sugar is a huge problem in the modern day diet. Sugar is a big contributor to heart disease, diabetes, high triglyceride levels, fatty liver disease and obesity. Sugar is hidden in almost all packaged foods/beverages. Added sugar is extra sugar that is added beyond what is naturally found and has no nutritional benefit for your body. The American Heart Association recommends limiting added sugars to no more than 25g for women and 36 grams for men per day. There are many names for sugar including maltose, sucrose (names ending in "ose"), high fructose corn syrup, molasses, cane sugar, corn sweetener, raw sugar, syrup, honey or fruit juice  concentrate.  ? ?One of the best ways to limit your added sugars is to stop drinking sweetened beverages such as soda, sweet tea, and fruit juice. ? ?There is 65g of added sugars in one 20oz bottle of Coke! That is equal to 7.5 donuts.  ? ?Pay attention  and read all nutrition facts labels. Below is an examples of a nutrition facts label. The #1 is showing you the total sugars where the # 2 is showing you the added sugars. This one serving has almost the max amount of added sugars per day! ? ? ? ? ?20 oz Soda ?65g Sugar = 7.5 Glazed Donuts ? ?16oz Energy  ?Drink ?54g Sugar = 6.5 Glazed Donuts ? ?Large Sweet  ?Tea ?38g Sugar = 4 Glazed Donuts ? ?20oz Sports  ?Drink ?34g Sugar = 3.5 Glazed Donuts ? ?8oz Chocolate Milk ?24g Sugar =2.5 Glazed Donuts ? ?8oz Orange  ?Juice ?21g Sugar = 2 Glazed Donuts ? ?1 Juice Box ?14g Sugar = 1.5 Glazed Donuts ? ?16oz Water= NO SUGAR!! ? ?EXERCISE ? ?Exercise is good. We?ve all heard that. In an ideal world, we would all have time and resources to get plenty of it. When you are active, your heart pumps more efficiently and you will feel better.  Multiple studies show that even walking regularly has benefits that include living a longer life. The American Heart Association recommends 150 minutes per week of exercise (30 minutes per day most days of the week). You can do this in any increment you wish. Nine or more 10-minute walks count. So does an hour-long exercise class. Break the time apart into what will work in your life. Some of the best things you can do include walking briskly, jogging, cycling or swimming laps. Not everyone is ready to ?exercise.? Sometimes we need to start with just getting active. Here are some easy ways to be more active throughout the day: ? Take the stairs instead of the elevator ? Go for a 10-15 minute walk during your lunch break (find a friend to make it more enjoyable) ? When shopping, park at the back of the parking lot ? If you take public transportation, get off one stop early and walk the extra distance ? Pace around while making phone calls ? ?Check with your doctor if you aren?t sure what your limitations may be. Always remember to drink plenty of water when doing any type of exercise. Don?t feel  like a failure if you?re not getting the 90-150 minutes per week. If you started by being a couch potato, then just a 10-minute walk each day is a huge improvement. Start with little victories and work your way up. ? ? ?HEALTHY EATING TIPS ? ?When looking to improve your eating habits, whether to lose weight, lower blood pressure or just be healthier, it helps to know what a serving size is.  ? ?Grains ?1 slice of bread, ? bagel, ? cup pasta or rice  Vegetables ?1 cup fresh or raw vegetables, ? cup cooked or canned ?Fruits ?1 piece of medium sized fruit, ? cup canned,   Meats/Proteins ?? cup dried       1 oz meat, 1 egg, ? cup cooked beans, nuts or seeds ? ?Dairy        Fats ?Individual yogurt container, 1 cup (8oz)    1 teaspoon margarine/butter or vegetable  ?milk or milk alternative, 1 slice of cheese          oil; 1 tablespoon mayonnaise  or salad dressing                 ? ?Plan ahead: make a menu of the meals for a week then create a grocery list to go with that menu. Consider meals that easily stretch into a night of leftovers, such as stews or casseroles. Or consider making two of your favorite meal and put one in the freezer for another night. Try a night or two each week that is ?meatless? or ?no cook? such as salads. When you get home from the grocery store wash and prepare your vegetables and fruits. Then when you need them they are ready to go.  ? ?Tips for going to the grocery store: ? [employer] store or generic brands ? Check the weekly ad from your store on-line or in their in-store flyer ? Look at the unit price on the shelf tag to compare/contrast the costs of different items ? Buy fruits/vegetables in season ? Carrots, bananas and apples are low-cost, naturally healthy items ? If meats or frozen vegetables are on sale, buy some extras and put in your freezer ? Limit buying prepared or ?ready to eat? items, even if they are pre-made salads or fruit snacks ? Do not shop when you?re hungry ? Foods at eye  level tend to be more expensive. Look on the high and low shelves for deals. ? Consider shopping at the farmer?s market for fresh foods in season. ? Avoid the cookie and chip aisles (these are expensive

## 2022-02-24 ENCOUNTER — Ambulatory Visit (INDEPENDENT_AMBULATORY_CARE_PROVIDER_SITE_OTHER): Payer: Medicare Other | Admitting: Internal Medicine

## 2022-02-24 ENCOUNTER — Encounter: Payer: Self-pay | Admitting: Internal Medicine

## 2022-02-24 VITALS — BP 140/82 | HR 60 | Resp 12 | Ht 61.75 in | Wt 217.0 lb

## 2022-02-24 DIAGNOSIS — I10 Essential (primary) hypertension: Secondary | ICD-10-CM | POA: Diagnosis not present

## 2022-02-24 DIAGNOSIS — E7800 Pure hypercholesterolemia, unspecified: Secondary | ICD-10-CM | POA: Diagnosis not present

## 2022-02-24 DIAGNOSIS — E119 Type 2 diabetes mellitus without complications: Secondary | ICD-10-CM

## 2022-02-24 DIAGNOSIS — Z0000 Encounter for general adult medical examination without abnormal findings: Secondary | ICD-10-CM | POA: Diagnosis not present

## 2022-02-24 DIAGNOSIS — Z23 Encounter for immunization: Secondary | ICD-10-CM

## 2022-02-24 DIAGNOSIS — Z79899 Other long term (current) drug therapy: Secondary | ICD-10-CM

## 2022-02-24 DIAGNOSIS — M79672 Pain in left foot: Secondary | ICD-10-CM

## 2022-02-24 MED ORDER — AMLODIPINE BESYLATE 10 MG PO TABS: ORAL_TABLET | ORAL | 11 refills | Status: DC

## 2022-02-24 NOTE — Telephone Encounter (Signed)
Seen by Dr mulberry °

## 2022-02-24 NOTE — Progress Notes (Signed)
Subjective:    Patient ID: Haley [name], female   DOB: 1956/08/26, 66 y.o.   MRN: [id number]   HPI  CPE without pap  1.  Pap:  Had TAH in 1994 for prolapsed uterus  2.  Mammogram:  Last 05/03/2021 and normal.  She has had breast reduction surgery in 09/2021.    3.  Osteoprevention:  Normal DEXA 09/2018.  Takes Vitamin D daily.  No calcium intake.  She is willing to try 1 cup almond milk daily and gradually increase to 3 cups daily. She is walking.  Has noted she catches her foot on things more likely, but not in her home.  Joined [employer], but very crowded.  Wants to switch to [employer].    4.  Guaiac Cards/FIT:  Last in 2019 and always negative.  5.  Colonoscopy:  04/30/20 with [place]/Dr. Lavon Paganini.  Severe diverticulosis, but no polyps.  Not due until 2031 again.    6.  Immunizations:   Immunization History  Administered Date(s) Administered   Influenza, High Dose Seasonal PF 07/24/2019   Influenza, Quadrivalent, Recombinant, Inj, Pf 08/01/2016   Influenza,inj,Quad PF,6+ Mos 12/28/2015, 07/27/2017, 08/04/2018   Influenza-Unspecified 07/10/2017, 07/24/2019, 08/04/2020, 08/08/2021   Moderna Sars-Covid-2 Vaccination 06/23/2020, 07/21/2020, 01/19/2021   Pfizer Covid-19 Vaccine Bivalent Booster 30yrs & up 07/28/2021   Pneumococcal Conjugate-13 09/19/2019   Pneumococcal Polysaccharide-23 08/13/1996, 07/22/2013, 09/21/2020   Td 11/12/1996   Tdap 04/18/2016   Zoster Recombinat (Shingrix) 01/04/2021, 04/06/2021     7.  Glucose/Cholesterol:  Last A1C was 6.5% in October.  Just started on Mounjaro through Cardiology for weight loss mainly and ultimately to control chronic associated health issues.  Cholesterol basically at goal save for HDL in October.    Current Meds  Medication Sig   amLODipine (NORVASC) 5 MG tablet Take 1 tablet (5 mg total) by mouth daily.   aspirin 81 MG tablet Take 81 mg by mouth daily.   Cholecalciferol (DIALYVITE VITAMIN D 5000) 125 MCG (5000 UT)  capsule Take 15,000 Units by mouth daily.   Cyanocobalamin (B-12 PO) Take 1 tablet by mouth daily.   EQ ALLERGY RELIEF, CETIRIZINE, 10 MG tablet Take 1 tablet by mouth once daily   GARLIC PO Take 7,846 capsules by mouth daily.   ibuprofen (ADVIL) 200 MG tablet 2-4 tabs by mouth twice daily with meals as needed   Liniments (BLUE-EMU SUPER STRENGTH EX) Apply 1 application topically daily as needed (pain).   lisinopril (ZESTRIL) 40 MG tablet Take 1 tablet by mouth once daily   metoprolol succinate (TOPROL-XL) 50 MG 24 hr tablet Take 1 tablet (50 mg total) by mouth daily. Take with or immediately following a meal.   mometasone (NASONEX) 50 MCG/ACT nasal spray USE 2 SPRAY(S) IN EACH NOSTRIL ONCE DAILY   Multiple Vitamins-Minerals (MULTIVITAMIN WITH MINERALS) tablet Take 1 tablet by mouth daily.   nitroGLYCERIN (NITROSTAT) 0.4 MG SL tablet DISSOLVE ONE TABLET UNDER THE TONGUE EVERY 5 MINUTES AS NEEDED FOR CHEST PAIN.  DO NOT EXCEED A TOTAL OF 3 DOSES IN 15 MINUTES   omega-3 acid ethyl esters (LOVAZA) 1 g capsule Take 1 capsule (1 g total) by mouth 2 (two) times daily.   rosuvastatin (CRESTOR) 10 MG tablet Take 1 tablet (10 mg total) by mouth daily.   tirzepatide [facility]) 2.5 MG/0.5ML Pen Inject 2.5 mg into the skin once a week.   vitamin C (ASCORBIC ACID) 500 MG tablet Take 1,000 mg by mouth daily.   zinc gluconate 50  MG tablet Take 50 mg by mouth daily.   Allergies  Allergen Reactions   Praluent [Alirocumab]     Had significant fatigue and memory loss--was lost driving in town.  Joint pain and nausea as well.   Zetia [Ezetimibe] Other (See Comments)    Joint pain and ear ringing   Atorvastatin Other (See Comments)    Pt states causes bilateral lower extremity muscle cramps   Pravastatin Nausea And Vomiting and Other (See Comments)    Causes dizziness   Past Medical History:  Diagnosis Date   Back pain    Coronary artery disease    Depression    DM type 2, controlled, with complication  (HCC) 07/22/2013   Type II DM diagnosed in ~ 2006 per pt hx.    Environmental and seasonal allergies    Ganglion cyst of wrist, right 05/09/2017   Hyperlipidemia    Hypertension    Itching    Refusal of blood transfusions as patient is Jehovah's Witness    Shingles    Stroke (HCC) 2000   per pt. report:   TIA   Past Surgical History:  Procedure Laterality Date   ABDOMINAL HYSTERECTOMY  1994   Laparoscopic; ovaries still intact.  Performed for prolapsed uterus.   AREOLA/NIPPLE RECONSTRUCTION WITH GRAFT Bilateral 09/19/2021   Procedure: FREE NIPPLE GRAFT;  Surgeon: Allena Napoleon, MD;  Location: [facility] OR;  Service: Plastics;  Laterality: Bilateral;   BREAST REDUCTION SURGERY Bilateral 09/19/2021   Procedure: Bilateral Breast Reduction;  Surgeon: Allena Napoleon, MD;  Location: [facility] OR;  Service: Plastics;  Laterality: Bilateral;  2 hours   CARDIAC CATHETERIZATION N/A 10/23/2016   Procedure: Left Heart Cath and Coronary Angiography;  Surgeon: Kathleene Hazel, MD;  Location: [facility] INVASIVE CV LAB;  Service: Cardiovascular;  Laterality: N/A;   CHOLECYSTECTOMY  1997   Laparoscopic   COLONOSCOPY  2005-at age 21   Hyperplastic polyp--[place]   LEFT HEART CATH AND CORONARY ANGIOGRAPHY N/A 05/04/2020   Procedure: LEFT HEART CATH AND CORONARY ANGIOGRAPHY;  Surgeon: Swaziland, Peter M, MD;  Location: [facility] INVASIVE CV LAB;  Service: Cardiovascular;  Laterality: N/A;   WISDOM TOOTH EXTRACTION     Family History  Problem Relation Age of Onset   Diabetes Father    Stroke Maternal Aunt        Multiple aunts died from strokes-maternal   Hypertension Son    Diabetes Son    Hypertension Brother    Diabetes Brother    Diabetes Sister        prediabetic   Cancer Sister 60       uterine cancer   Seizures Brother    Social History   Socioeconomic History   Marital status: Single    Spouse name: Not on file   Number of children: 2   Years of education: 12+   Highest education level: Some  college, no degree  Occupational History   Occupation: Print production planner    Comment: Retired now  Tobacco Use   Smoking status: Never   Smokeless tobacco: Never  Vaping Use   Vaping Use: Never used  Substance and Sexual Activity   Alcohol use: Yes    Alcohol/week: 0.0 standard drinks    Comment: occasionally-1 to 2 drinks monthly    Drug use: No   Sexual activity: Not Currently    Birth control/protection: Surgical  Other Topics Concern   Not on file  Social History Narrative   Did get some post high school  training/education   Lives alone   Watches 2 great grandchildren frequently   Daughter lives in [place] and checks in regularly.   Social Determinants of Health   Financial Resource Strain: Not on file  Food Insecurity: Not on file  Transportation Needs: Not on file  Physical Activity: Not on file  Stress: Not on file  Social Connections: Not on file  Intimate Partner Violence: Not on file       Review of Systems  Eyes:  Negative for visual disturbance (Had eyes checked with Dr. Dione Booze.).  Respiratory:  Negative for shortness of breath.   Cardiovascular:  Positive for palpitations (Very rare.). Negative for chest pain and leg swelling.  Musculoskeletal:        Left foot hurts on plantar ball of foot.  Feels like walking on a bone.  Problem for 2-3 weeks on and off.  Has become more consistent.  Does have a relatively new pair of shoes--flats.  Also limping at times due to right knee pain.     Objective:   BP 140/82 (BP Location: Left Arm, Patient Position: Sitting, Cuff Size: Normal)   Pulse 60   Resp 12   Ht 5' 1.75" (1.568 m)   Wt 217 lb (98.4 kg)   BMI 40.01 kg/m   Physical Exam HENT:     Head: Normocephalic and atraumatic.     Right Ear: Tympanic membrane, ear canal and external ear normal.     Left Ear: Tympanic membrane, ear canal and external ear normal.     Nose: Nose normal.     Mouth/Throat:     Mouth: Mucous membranes are moist.      Pharynx: Oropharynx is clear.  Eyes:     Extraocular Movements: Extraocular movements intact.     Conjunctiva/sclera: Conjunctivae normal.     Pupils: Pupils are equal, round, and reactive to light.     Comments: Discs sharp  Cardiovascular:     Rate and Rhythm: Normal rate and regular rhythm.     Heart sounds: S1 normal and S2 normal. No murmur heard.    No friction rub. No S3 or S4 sounds.     Comments: No carotid bruits.  Carotid, radial, femoral, DP and PT pulses normal and equal.   Pulmonary:     Effort: Pulmonary effort is normal.     Breath sounds: Normal breath sounds.  Chest:  Breasts:    Right: No inverted nipple, mass or nipple discharge.     Left: No inverted nipple, mass or nipple discharge.  Abdominal:     General: Bowel sounds are normal.     Palpations: Abdomen is soft. There is no hepatomegaly, splenomegaly or mass.     Tenderness: There is no abdominal tenderness.       Comments: Incarcerated ventral hernia.  Mildly tender.  Unable to palpate hernia opening.  Genitourinary:    Comments: Normal external female genitalia.  No uterine or adnexal mass or tenderness. Musculoskeletal:        General: Normal range of motion.     Cervical back: Normal range of motion and neck supple.     Right lower leg: No edema.     Left lower leg: No edema.       Feet:  Feet:     Right foot:     Skin integrity: Skin integrity normal.     Toenail Condition: Right toenails are normal.     Left foot:     Skin integrity: Skin integrity  normal.     Toenail Condition: Left toenails are normal.     Comments: Tenderness over plantar MT head.  2nd toe overlaps 3rd toe and up against great toe as well with MT head more prominent on plantar surface Lymphadenopathy:     Head:     Right side of head: No submental or submandibular adenopathy.     Left side of head: No submental or submandibular adenopathy.     Cervical: No cervical adenopathy.     Upper Body:     Right upper body: No  supraclavicular or axillary adenopathy.     Left upper body: No supraclavicular or axillary adenopathy.     Lower Body: No right inguinal adenopathy. No left inguinal adenopathy.  Skin:    General: Skin is warm.     Capillary Refill: Capillary refill takes less than 2 seconds.     Findings: No rash.  Neurological:     General: No focal deficit present.     Mental Status: She is alert and oriented to person, place, and time.     Cranial Nerves: Cranial nerves 2-12 are intact.     Sensory: Sensation is intact.     Motor: Motor function is intact.     Coordination: Coordination is intact.     Gait: Gait is intact.     Deep Tendon Reflexes: Reflexes are normal and symmetric.  Psychiatric:        Attention and Perception: Attention normal.        Mood and Affect: Mood normal.        Speech: Speech normal.        Behavior: Behavior normal. Behavior is cooperative.      Assessment & Plan    CPE without pap Fasting labs Call if do not receive notice to make mammogram appt in another month Drink almond milk 3 times daily. 2nd Moderna bivalent booster  2.  DM:  A1C, urine microalbumin/crea  3.  Hypertension:  increase Amlodipine to 10 mg daily as bps at home high/borderline.  BP check in 1 month  4.  Hyperlipidemia:  FLP  5.  Left foot pain:  to avoid new flats and see if pain resolves.  If not, podiatry referral

## 2022-02-26 LAB — COMPREHENSIVE METABOLIC PANEL
ALT: 27 IU/L (ref 0–32)
AST: 21 IU/L (ref 0–40)
Albumin/Globulin Ratio: 1.5 (ref 1.2–2.2)
Albumin: 4.3 g/dL (ref 3.8–4.8)
Alkaline Phosphatase: 65 IU/L (ref 44–121)
BUN/Creatinine Ratio: 14 (ref 12–28)
BUN: 14 mg/dL (ref 8–27)
Bilirubin Total: 0.4 mg/dL (ref 0.0–1.2)
CO2: 19 mmol/L — ABNORMAL LOW (ref 20–29)
Calcium: 9.9 mg/dL (ref 8.7–10.3)
Chloride: 107 mmol/L — ABNORMAL HIGH (ref 96–106)
Creatinine, Ser: 0.97 mg/dL (ref 0.57–1.00)
Globulin, Total: 2.9 g/dL (ref 1.5–4.5)
Glucose: 90 mg/dL (ref 70–99)
Potassium: 4.4 mmol/L (ref 3.5–5.2)
Sodium: 144 mmol/L (ref 134–144)
Total Protein: 7.2 g/dL (ref 6.0–8.5)
eGFR: 64 mL/min/{1.73_m2} (ref >59)

## 2022-02-26 LAB — CBC WITH DIFFERENTIAL/PLATELET
Basophils Absolute: 0.1 10*3/uL (ref 0.0–0.2)
Basos: 1 %
EOS (ABSOLUTE): 0.2 10*3/uL (ref 0.0–0.4)
Eos: 3 %
Hematocrit: 43.8 % (ref 34.0–46.6)
Hemoglobin: 14.3 g/dL (ref 11.1–15.9)
Immature Grans (Abs): 0 10*3/uL (ref 0.0–0.1)
Immature Granulocytes: 0 %
Lymphocytes Absolute: 2.4 10*3/uL (ref 0.7–3.1)
Lymphs: 44 %
MCH: 30.6 pg (ref 26.6–33.0)
MCHC: 32.6 g/dL (ref 31.5–35.7)
MCV: 94 fL (ref 79–97)
Monocytes Absolute: 0.6 10*3/uL (ref 0.1–0.9)
Monocytes: 11 %
Neutrophils Absolute: 2.2 10*3/uL (ref 1.4–7.0)
Neutrophils: 41 %
Platelets: 269 10*3/uL (ref 150–450)
RBC: 4.68 x10E6/uL (ref 3.77–5.28)
RDW: 12.7 % (ref 11.7–15.4)
WBC: 5.4 10*3/uL (ref 3.4–10.8)

## 2022-02-26 LAB — MICROALBUMIN / CREATININE URINE RATIO
Creatinine, Urine: 82 mg/dL
Microalb/Creat Ratio: 4 mg/g creat (ref 0–29)
Microalbumin, Urine: 3.1 ug/mL

## 2022-02-26 LAB — LIPID PANEL W/O CHOL/HDL RATIO
Cholesterol, Total: 134 mg/dL (ref 100–199)
HDL: 50 mg/dL (ref >39)
LDL Chol Calc (NIH): 65 mg/dL (ref 0–99)
Triglycerides: 101 mg/dL (ref 0–149)
VLDL Cholesterol Cal: 19 mg/dL (ref 5–40)

## 2022-02-26 LAB — HGB A1C W/O EAG: Hgb A1c MFr Bld: 6.2 % — ABNORMAL HIGH (ref 4.8–5.6)

## 2022-03-09 ENCOUNTER — Telehealth: Payer: Self-pay

## 2022-03-09 NOTE — Telephone Encounter (Signed)
After discussing with Dr Amil Amen, will ask patient to go back to 5mg  and call us with a progress report on Monday. ?

## 2022-03-09 NOTE — Telephone Encounter (Signed)
Patient called to inform that she is stopping Amlodipine 10mg  today 03/09/22. She was previously on amlodipine 5mg , but was increase on 02/24/22. Patient has noticed feet swelling, tightness in chest, and fatigue since starting 10mg  dose. Issue started 2 days after starting the higher dose. Would be willing to start on amlodipine again if it goes back to 5 mg ?

## 2022-03-13 ENCOUNTER — Telehealth: Payer: Self-pay | Admitting: Cardiology

## 2022-03-13 NOTE — Telephone Encounter (Signed)
Yes patient should resume Mounjaro at 2.5mg  weekly. ?Called pt and made her aware to resume today at 2.5mg  weekly. Patient will resume today. ?

## 2022-03-13 NOTE — Telephone Encounter (Signed)
Pt c/o medication issue: ? ?1. Name of Medication: tirzepatide [facility]) 2.5 MG/0.5ML Pen ? ?2. How are you currently taking this medication (dosage and times per day)? Took dose in office then second does the following week.  ? ?3. Are you having a reaction (difficulty breathing--STAT)? No  ? ?4. What is your medication issue? Patient is calling stating she got mixed up and thought she was only supposed to be taking this medication once a month. She reports she has only had two doses missing last week. She is wanting to know if she should go ahead and take another dose due to just realizing she should be on this weekly.   ? ?

## 2022-03-16 MED ORDER — MOUNJARO 5 MG/0.5ML ~~LOC~~ SOAJ: 5.0000 mg | SUBCUTANEOUS | 0 refills | Status: DC

## 2022-03-16 NOTE — Addendum Note (Signed)
Addended by: Saron Tweed E on: 03/16/2022 09:55 AM ? ? Modules accepted: Orders ? ?

## 2022-03-16 NOTE — Telephone Encounter (Signed)
Called pt, she missed just 1 dose. Has given 3 doses so far, 4th dose is due on Monday. Tolerating well so far aside from some constipation. Willing to increase to 5mg  dose for her next fill. Rx sent in. Will call pt in another month for continued dose titration. ?

## 2022-03-20 ENCOUNTER — Other Ambulatory Visit: Payer: Self-pay | Admitting: Internal Medicine

## 2022-03-21 NOTE — Telephone Encounter (Signed)
Patient reported that she is doing much better with 5mg  amlodipine and no longer has previously reported symptoms. Patient has a bp and pulse check 03/22/22 ?

## 2022-03-22 ENCOUNTER — Ambulatory Visit (INDEPENDENT_AMBULATORY_CARE_PROVIDER_SITE_OTHER): Payer: Medicare Other

## 2022-03-22 VITALS — BP 174/90 | HR 76

## 2022-03-22 DIAGNOSIS — Z1211 Encounter for screening for malignant neoplasm of colon: Secondary | ICD-10-CM

## 2022-03-22 DIAGNOSIS — Z0130 Encounter for examination of blood pressure without abnormal findings: Secondary | ICD-10-CM | POA: Diagnosis not present

## 2022-03-22 LAB — POC FIT TEST STOOL: Fecal Occult Blood: NEGATIVE

## 2022-03-22 NOTE — Progress Notes (Signed)
Patient reported that she has been taking bp medication consistently. Has been taking amlodipine 5mg  daily instead of 10mg . Patient has not yet taken medication since she takes it at 5pm every day. Patient wanted to mention that her home monitor indicates that her bp is within range. Her home monitor morning readings: 125/74 pulse 59 - 03/22/2022 124/82 pulse 60 - 03/21/2022 117/68 pulse 58 - 03/20/2022

## 2022-03-22 NOTE — Progress Notes (Signed)
Please have her bring her monitor in and compare again as has been a while.  If we get similar readings, will accept her home monitor readings.

## 2022-03-29 ENCOUNTER — Encounter: Payer: Self-pay | Admitting: Internal Medicine

## 2022-03-29 ENCOUNTER — Ambulatory Visit (INDEPENDENT_AMBULATORY_CARE_PROVIDER_SITE_OTHER): Payer: Medicare Other | Admitting: Internal Medicine

## 2022-03-29 VITALS — BP 170/110 | HR 76 | Resp 16 | Ht 61.75 in | Wt 214.0 lb

## 2022-03-29 DIAGNOSIS — J309 Allergic rhinitis, unspecified: Secondary | ICD-10-CM | POA: Diagnosis not present

## 2022-03-29 DIAGNOSIS — I10 Essential (primary) hypertension: Secondary | ICD-10-CM | POA: Diagnosis not present

## 2022-03-29 DIAGNOSIS — R42 Dizziness and giddiness: Secondary | ICD-10-CM | POA: Diagnosis not present

## 2022-03-29 DIAGNOSIS — J0140 Acute pansinusitis, unspecified: Secondary | ICD-10-CM

## 2022-03-29 MED ORDER — AMOXICILLIN-POT CLAVULANATE 875-125 MG PO TABS: ORAL_TABLET | ORAL | 0 refills | Status: DC

## 2022-03-29 MED ORDER — FLUCONAZOLE 150 MG PO TABS: ORAL_TABLET | ORAL | 0 refills | Status: DC

## 2022-03-29 MED ORDER — AMLODIPINE BESYLATE 5 MG PO TABS: ORAL_TABLET | ORAL | 3 refills | Status: DC

## 2022-03-29 MED ORDER — FEXOFENADINE HCL 180 MG PO TABS: ORAL_TABLET | ORAL | Status: DC

## 2022-03-29 NOTE — Progress Notes (Signed)
Subjective:    Patient ID: Haley [name], female   DOB: July 31, 1956, 66 y.o.   MRN: [id number]   HPI   Hypertension:  Decreased her amlodipine from 10 mg to 5 mg again due to feet and ankle swelling.  Went back to 5 mg and swelling went down.  Brought in her wrist bp monitor today to compare and BP is very close to what we get with arm manual bp check.  Her BP at home has been generally 120/70 range.  Highest was 138/80.  HR frequently in low 60s to high 50s. This is second time we have compared monitors and appears to remain accurate.  2.  Vertigo:  started 2 weeks ago.  Only happens when awakens in morning.  When first started, felt like the room was spinning, now looks and feels like room spinning. Rolls to right to get out of bed.  She may have had a couple of episodes where rolled to the left--not clear.  Generally, starts after she comes to a sitting position after rolling to right out of bed.  When first started, vertigo would last 5 min or less.  Now lasting about 10 minutes after getting out of bed.  Gets worse when gets up to walk.  + associated nausea, but no vomiting.  Difficulty with balance when having vertiginous symptoms.  Does have bars up in bathroom.  No focal numbness, tingling or weakness.  No speech or swallowing issues.  Once the vertigo goes away, no more symptoms until getting up the next morning when arising.  She is able to complete her usual daily activities, including going for a walk without issues.   Also with tenderness on left maxillary sinus area.  Has a bad taste with drainage down throat--has a metallic taste.  Right ear also feel plugged on and off as well.   Current Meds  Medication Sig   amLODipine (NORVASC) 10 MG tablet 1 tab by mouth daily. (Patient taking differently: Has been taking 5mg  daily)   aspirin 81 MG tablet Take 81 mg by mouth daily.   Cholecalciferol (DIALYVITE VITAMIN D 5000) 125 MCG (5000 UT) capsule Take 15,000 Units by mouth daily.    Cyanocobalamin (B-12 PO) Take 1 tablet by mouth daily.   EQ ALLERGY RELIEF, CETIRIZINE, 10 MG tablet Take 1 tablet by mouth once daily   GARLIC PO Take capsules by mouth daily.   ibuprofen (ADVIL) 200 MG tablet 2-4 tabs by mouth twice daily with meals as needed   Liniments (BLUE-EMU SUPER STRENGTH EX) Apply 1 application topically daily as needed (pain).   lisinopril (ZESTRIL) 40 MG tablet Take 1 tablet by mouth once daily   metoprolol succinate (TOPROL-XL) 50 MG 24 hr tablet Take 1 tablet (50 mg total) by mouth daily. Take with or immediately following a meal.   mometasone (NASONEX) 50 MCG/ACT nasal spray USE 2 SPRAY(S) IN EACH NOSTRIL ONCE DAILY   Multiple Vitamins-Minerals (MULTIVITAMIN WITH MINERALS) tablet Take 1 tablet by mouth daily.   nitroGLYCERIN (NITROSTAT) 0.4 MG SL tablet DISSOLVE ONE TABLET UNDER THE TONGUE EVERY 5 MINUTES AS NEEDED FOR CHEST PAIN.  DO NOT EXCEED A TOTAL OF 3 DOSES IN 15 MINUTES   omega-3 acid ethyl esters (LOVAZA) 1 g capsule Take 1 capsule (1 g total) by mouth 2 (two) times daily.   Potassium 95 MG TABS Take by mouth daily.   rosuvastatin (CRESTOR) 10 MG tablet Take 1 tablet (10 mg total) by mouth daily.  tirzepatide [facility]) 5 MG/0.5ML Pen Inject 5 mg into the skin once a week.   vitamin C (ASCORBIC ACID) 500 MG tablet Take 1,000 mg by mouth daily.   zinc gluconate 50 MG tablet Take 50 mg by mouth daily.   Allergies  Allergen Reactions   Praluent [Alirocumab]     Had significant fatigue and memory loss--was lost driving in town.  Joint pain and nausea as well.   Zetia [Ezetimibe] Other (See Comments)    Joint pain and ear ringing   Atorvastatin Other (See Comments)    Pt states causes bilateral lower extremity muscle cramps   Pravastatin Nausea And Vomiting and Other (See Comments)    Causes dizziness     Review of Systems    Objective:   BP (!) 170/110 (BP Location: Left Arm, Patient Position: Sitting, Cuff Size: Normal)   Pulse 76    Resp 16   Ht 5' 1.75" (1.568 m)   Wt 214 lb (97.1 kg)   BMI 39.46 kg/m   Physical Exam NAD HEENT:  PERRL, EOMI, Discs sharp, TMs pearly gray, nasal mucosa swollen and red bilaterally, Very tender over both frontal and Maxillary sinus areas bilaterally Unable to see posterior pharynx well Neck:  Supple, No adenopathy Chest:  CTA CV:  RRR without murmur or rub.  Radial and DP pulses normal and equal.   Assessment & Plan    Hypertension:  Gets anxious with medical visits and has many bp checks with home monitor that are at very good levels and none are out of range.  We have checked this monitor against our manual bp check now twice and remains consistent/accurate.  She will continue to home monitor without change to bp meds and notify if bp getting above 140/90.  Again, highest has been 138/80 with the majority in 120/70 range or lower.  HR generally in high 50s to low 60s, limiting increase of beta blockade.    2.  Vertigo--no central findings.  Suspect related to problem #3  3.  Acute Sinusitis:  Augmentin 850/125 1 tab by mouth twice daily for 10 days.  Fluconazole 150 mg daily for 2 days after completing treatment if yeast infection.  4.  Environmental and seasonal allergies:  did not tolerate neti pot--try nasal saline.  Use Nasonex every day--2 sprays each nostril instead of one each side.  Switch to Fexofenadine 180 mg daily.

## 2022-04-12 ENCOUNTER — Telehealth: Payer: Self-pay | Admitting: Pharmacist

## 2022-04-12 MED ORDER — MOUNJARO 7.5 MG/0.5ML ~~LOC~~ SOAJ
7.5000 mg | SUBCUTANEOUS | 0 refills | Status: DC
Start: 2022-04-12 — End: 2022-05-04

## 2022-04-12 NOTE — Telephone Encounter (Signed)
Called pt to follow up with Mounjaro 5mg  dose. Reports having constipation and some vertigo which she thinks is unrelated. Advised she can try docusate or senna to help with constipation, likely coming from the [facility]. Checks her glucose twice a day -has been running 105-110, usually 98 or so. Has given 3 of the 5mg  dose so far. Likes that her blood sugar doesn't drop low at all on [facility] like it has in the past before she started the medication. Overall pleased with Mounjaro and would like to increase her dose. Rx sent in for 7.5mg  dose and will call pt in another month.

## 2022-04-26 ENCOUNTER — Telehealth: Payer: Self-pay | Admitting: *Deleted

## 2022-04-26 NOTE — Telephone Encounter (Signed)
Received on (04/21/22) via of fax DME Standard Written Order from [facility] [place].  Requesting signature,date, and return.  Given to provider to sign and return.   DME Standard Written Order signed and faxed back to [facility] [place].  Confirmation received and copy scanned into the chart.//AB/CMA

## 2022-05-04 ENCOUNTER — Telehealth: Payer: Self-pay | Admitting: Pharmacist

## 2022-05-04 MED ORDER — MOUNJARO 10 MG/0.5ML ~~LOC~~ SOAJ: 10.0000 mg | SUBCUTANEOUS | 0 refills | Status: DC

## 2022-05-04 NOTE — Telephone Encounter (Signed)
Called pt to follow up with [facility] 7.5mg . Reports tolerating well. Started using stool softener, no further issues with constipation. Glucose slightly higher but has been on the go more so not eating as healthy. No lows. Home weight down to 207 lbs. Interested in increasing her dose. Rx sent in for next dose increase of 10mg  weekly.

## 2022-05-10 NOTE — Telephone Encounter (Signed)
Patient calling in bout the [facility] seeing if a prescription is supposed to be called in. Please advise

## 2022-05-10 NOTE — Telephone Encounter (Signed)
This was already done when I spoke with pt on 6/29. Pt is aware.

## 2022-06-01 ENCOUNTER — Telehealth: Payer: Self-pay | Admitting: Pharmacist

## 2022-06-01 MED ORDER — MOUNJARO 10 MG/0.5ML ~~LOC~~ SOAJ: 10.0000 mg | SUBCUTANEOUS | 0 refills | Status: DC

## 2022-06-01 NOTE — Telephone Encounter (Signed)
Called pt to follow up with Mounjaro tolerability. Has had more fatigue on 10mg  dose but has also had lower BP this past month, thinks fatigue may be more coming from this instead as her BP comes down. Doesn't have as much of an appetite with Mounjaro. Reports glucose has been good. Would like to give the 10mg  dose another month instead of increasing at this time. Refill sent in, will touch base with pt in another month.

## 2022-06-12 ENCOUNTER — Other Ambulatory Visit: Payer: Self-pay | Admitting: Internal Medicine

## 2022-06-12 DIAGNOSIS — Z1231 Encounter for screening mammogram for malignant neoplasm of breast: Secondary | ICD-10-CM

## 2022-06-26 ENCOUNTER — Telehealth: Payer: Self-pay | Admitting: Pharmacist

## 2022-06-26 MED ORDER — MOUNJARO 12.5 MG/0.5ML ~~LOC~~ SOAJ: 12.5000 mg | SUBCUTANEOUS | 0 refills | Status: DC

## 2022-06-26 NOTE — Telephone Encounter (Signed)
Left message for pt to follow up with Mounjaro tolerability.   Spent an extra month on the 10mg  dose, had been experiencing some fatigue but also had lower blood pressure than normal that may have been contributing. Will also need to see how home glucose readings have been looking, does not take anything else for her DM (A1c was 6.5% last fall). Once pt at maintenance dose of Mounjaro, she prefers rx be sent in as 3 month supply for cost savings.

## 2022-06-26 NOTE — Telephone Encounter (Signed)
Spoke with pt. Fatigue was unrelated and has since gone away. Has been losing weight slowly and has noticed decrease in appetite. Pleased with Greggory Keen so far. Glucose has been well controlled, 85-105 fasting in the morning. Would like to increase dose to 12.5mg  for next month. Rx has been sent in. Will call once more in another month for final dose increase to 15mg  and will plan to send in rx as 3 month supply at that time for cost savings.

## 2022-06-26 NOTE — Addendum Note (Signed)
Addended by: Aliviana Burdell E on: 06/26/2022 02:11 PM   Modules accepted: Orders

## 2022-07-05 ENCOUNTER — Ambulatory Visit
Admission: RE | Admit: 2022-07-05 | Discharge: 2022-07-05 | Disposition: A | Discharge status: Home / Self Care | Payer: Medicare Other | Source: Ambulatory Visit | Attending: Internal Medicine | Admitting: Internal Medicine

## 2022-07-05 DIAGNOSIS — Z1231 Encounter for screening mammogram for malignant neoplasm of breast: Secondary | ICD-10-CM

## 2022-07-14 ENCOUNTER — Other Ambulatory Visit: Payer: Self-pay

## 2022-07-14 MED ORDER — METOPROLOL SUCCINATE ER 50 MG PO TB24: 50.0000 mg | ORAL_TABLET | Freq: Every day | ORAL | 2 refills | Status: DC

## 2022-07-14 MED ORDER — NITROGLYCERIN 0.4 MG SL SUBL: SUBLINGUAL_TABLET | SUBLINGUAL | 0 refills | Status: DC

## 2022-07-19 NOTE — Progress Notes (Deleted)
Cardiology Office Note:    Date:  07/19/2022   ID:  Haley [name], DOB 10/22/1956, MRN [id number]  PCP:  Julieanne Manson, MD   [facility] Providers Cardiologist:  Meriam Sprague, MD {   Referring MD: Julieanne Manson, MD   History of Present Illness:    Haley [name] is a 66 y.o. female with a hx of nonobstructive CAD on cath in 2017, DMII, HTN, and HLD who was previously followed by Dr. Delton See who now presents to clinic for follow-up.  Per review of the record, patient underwent coronary CTA in 2017 with calcium score of 239 which was 93 percentile, moderate diffuse CAD possible obstructive.  Cardiac catheterization 10/2016 mild nonobstructive disease in the LAD, moderate stenosis in the mid to distal PDA 50%. LV function normal. Medical therapy recommended.  Saw Dr. Delton See on 04/23/20 with SOB and chest pressure. Underwent repeat LHC on 05/04/20 which showed nonobstructive CAD similar to prior.   Was last seen in clinic on 01/2022 where she was doing well from a CV standpoint. Was started on mounjaro for weight loss/DMII.    Today, ***  Past Medical History:  Diagnosis Date   Back pain    Coronary artery disease    Depression    DM type 2, controlled, with complication (HCC) 07/22/2013   Type II DM diagnosed in ~ 2006 per pt hx.    Environmental and seasonal allergies    Ganglion cyst of wrist, right 05/09/2017   Hyperlipidemia    Hypertension    Itching    Refusal of blood transfusions as patient is Jehovah's Witness    Shingles    Stroke (HCC) 2000   per pt. report:   TIA    Past Surgical History:  Procedure Laterality Date   ABDOMINAL HYSTERECTOMY  1994   Laparoscopic; ovaries still intact.  Performed for prolapsed uterus.   AREOLA/NIPPLE RECONSTRUCTION WITH GRAFT Bilateral 09/19/2021   Procedure: FREE NIPPLE GRAFT;  Surgeon: Allena Napoleon, MD;  Location: [facility] OR;  Service: Plastics;  Laterality: Bilateral;   BREAST REDUCTION SURGERY  Bilateral 09/19/2021   Procedure: Bilateral Breast Reduction;  Surgeon: Allena Napoleon, MD;  Location: [facility] OR;  Service: Plastics;  Laterality: Bilateral;  2 hours   CARDIAC CATHETERIZATION N/A 10/23/2016   Procedure: Left Heart Cath and Coronary Angiography;  Surgeon: Kathleene Hazel, MD;  Location: [facility] INVASIVE CV LAB;  Service: Cardiovascular;  Laterality: N/A;   CHOLECYSTECTOMY  1997   Laparoscopic   COLONOSCOPY  2005-at age 52   Hyperplastic polyp--[place]   LEFT HEART CATH AND CORONARY ANGIOGRAPHY N/A 05/04/2020   Procedure: LEFT HEART CATH AND CORONARY ANGIOGRAPHY;  Surgeon: [place], Peter M, MD;  Location: [facility] INVASIVE CV LAB;  Service: Cardiovascular;  Laterality: N/A;   REDUCTION MAMMAPLASTY Bilateral 09/19/2021   WISDOM TOOTH EXTRACTION      Current Medications: No outpatient medications have been marked as taking for the 07/21/22 encounter (Appointment) with Meriam Sprague, MD.     Allergies:   Praluent [alirocumab], Zetia [ezetimibe], Atorvastatin, and Pravastatin   Social History   Socioeconomic History   Marital status: Single    Spouse name: Not on file   Number of children: 2   Years of education: 12+   Highest education level: Some college, no degree  Occupational History   Occupation: Print production planner    Comment: Retired now  Tobacco Use   Smoking status: Never    Passive exposure: Never   Smokeless tobacco:  Never  Vaping Use   Vaping Use: Never used  Substance and Sexual Activity   Alcohol use: Yes    Alcohol/week: 0.0 standard drinks of alcohol    Comment: occasionally-1 to 2 drinks monthly    Drug use: No   Sexual activity: Not Currently    Birth control/protection: Surgical  Other Topics Concern   Not on file  Social History Narrative   Did get some post high school training/education   Lives alone   [place] 2 great grandchildren frequently   Daughter lives in [place] and checks in regularly.   Social Determinants of Health    Financial Resource Strain: Low Risk  (02/22/2021)   Overall Financial Resource Strain (CARDIA)    Difficulty of Paying Living Expenses: Not hard at all  Food Insecurity: No Food Insecurity (02/22/2021)   Hunger Vital Sign    Worried About Running Out of Food in the Last Year: Never true    Ran Out of Food in the Last Year: Never true  Transportation Needs: No Transportation Needs (02/22/2021)   PRAPARE - Administrator, Civil Service (Medical): No    Lack of Transportation (Non-Medical): No  Physical Activity: Insufficiently Active (07/09/2018)   Exercise Vital Sign    Days of Exercise per Week: 3 days    Minutes of Exercise per Session: 30 min  Stress: No Stress Concern Present (07/09/2018)   [employer] of Occupational Health - Occupational Stress Questionnaire    Feeling of Stress : Only a little  Social Connections: Somewhat Isolated (07/09/2018)   Social Connection and Isolation Panel [NHANES]    Frequency of Communication with Friends and Family: More than three times a week    Frequency of Social Gatherings with Friends and Family: More than three times a week    Attends Religious Services: 1 to 4 times per year    Active Member of [employer] or Organizations: No    Attends Engineer, structural: Never    Marital Status: Divorced     Family History: The patient's family history includes Cancer (age of onset: 35) in her sister; Diabetes in her brother, father, sister, and son; Hypertension in her brother and son; Seizures in her brother; Stroke in her maternal aunt.  ROS:   Please see the history of present illness.    Review of Systems  Constitutional:  Negative for chills and fever.  HENT:  Negative for sore throat.   Eyes:  Negative for blurred vision.  Respiratory:  Negative for shortness of breath.   Cardiovascular:  Negative for chest pain, palpitations, orthopnea, claudication, leg swelling and PND.  Gastrointestinal:  Negative for nausea and  vomiting.  Genitourinary:  Negative for urgency.  Musculoskeletal:  Negative for falls.  Skin:  Negative for itching and rash.  Neurological:  Negative for dizziness and loss of consciousness.  Endo/Heme/Allergies:  Does not bruise/bleed easily.  Psychiatric/Behavioral:  Negative for substance abuse.      EKGs/Labs/Other Studies Reviewed:    The following studies were reviewed today: 2D-Echo 05/12/20 IMPRESSIONS   1. Left ventricular ejection fraction, by estimation, is 60 to 65%. The left ventricle has normal function. The left ventricle has no regional wall motion abnormalities. There is mild concentric left ventricular hypertrophy. Left ventricular diastolic parameters are indeterminate.   2. Right ventricular systolic function is normal. The right ventricular size is normal. Tricuspid regurgitation signal is inadequate for assessing PA pressure.   3. The mitral valve is normal in structure. Trivial mitral  valve  regurgitation. No evidence of mitral stenosis.   4. The aortic valve is tricuspid. Aortic valve regurgitation is not visualized. No aortic stenosis is present.   5. The inferior vena cava is normal in size with greater than 50% respiratory variability, suggesting right atrial pressure of 3 mmHg.   Comparison(s): No significant change from prior study.   Conclusion(s)/Recommendation(s): Normal biventricular function without evidence of hemodynamically significant valvular heart disease.  Cardiac Cath 05/04/20 Ost LAD to Prox LAD lesion is 30% stenosed. RPDA lesion is 50% stenosed. The left ventricular systolic function is normal. LV end diastolic pressure is mildly elevated. The left ventricular ejection fraction is 55-65% by visual estimate.   1. Nonobstructive CAD. Unchanged from 2017. 2. Normal LV function 3. Mildly elevated LVEDP 20 mm Hg   Plan: medical management.      Coronary CTA IMPRESSION: 1. Coronary calcium score of 239. This was 57 percentile for age and  sex matched control.   2. Normal coronary origin with right dominance.   3. 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 We will send additional analysis for CT FFT evaluation once available.   4. Mildly dilated pulmonary artery measuring 31 x 27 mm suspicious for pulmonary hypertension.    EKG:  EKG was not ordered today  Recent Labs: 02/24/2022: ALT 27; BUN 14; Creatinine, Ser 0.97; Hemoglobin 14.3; Platelets 269; Potassium 4.4; Sodium 144  Recent Lipid Panel    Component Value Date/Time   CHOL 134 02/24/2022 1107   TRIG 101 02/24/2022 1107   HDL 50 02/24/2022 1107   LDLCALC 65 02/24/2022 1107   LDLDIRECT 134 (H) 07/22/2013 1620          Physical Exam:    VS:  There were no vitals taken for this visit.    Wt Readings from Last 3 Encounters:  03/29/22 214 lb (97.1 kg)  02/24/22 217 lb (98.4 kg)  01/30/22 217 lb 6.4 oz (98.6 kg)     GEN:  Well nourished, well developed in no acute distress HEENT: Normal NECK: No JVD; No carotid bruits CARDIAC: RRR, no murmurs, rubs, gallops RESPIRATORY:  Clear to auscultation without rales, wheezing or rhonchi  ABDOMEN: Soft, non-tender, non-distended MUSCULOSKELETAL:  No edema; No deformity  SKIN: Warm and dry NEUROLOGIC:  Alert and oriented x 3 PSYCHIATRIC:  Normal affect   ASSESSMENT:    No diagnosis found.   PLAN:    In order of problems listed above:  #Nonobstructive CAD: Patient with moderate, non-obstructive CAD on cath in 2021 similar to that seen in 2017. No current anginal symptoms. Active and walks daily. Discussed that if we stop the metoprolol, we should increase amlodipine. She would like to stay on her current regimen for now.  -Continue ASA 81mg  daily -Continue lisinopril 40mg  daily -Continue metop 50mg  XL daily -Continue ranexa 500mg  BID  -Continue crestor 10mg  daily -Imdur stopped due to HA  #HTN: Very well controlled at home and at goal <120s/80s.  -Continue lisinopril 40mg  daily -Continue metop  50mg  XL daily -Continue amlodipine 5mg  daily  #HLD: LDL 65 which is at goal for <70. -Continue crestor 10mg  daily -Monitor with lifestyle modifications and up-titrate crestor if needed  #Palpitations: -Continue metop 50mg  XL daily  #DMII: -Will look into coverage for GLP-1 agonist  #Morbid Obesity: BMI 41. Continue diet and lifestyle as detailed below. Was started on mounjaro.     Medication Adjustments/Labs and Tests Ordered: Current medicines are reviewed at length with the patient today.  Concerns regarding medicines are outlined  above.  No orders of the defined types were placed in this encounter.  No orders of the defined types were placed in this encounter.   There are no Patient Instructions on file for this visit.    I,Mykaella Javier,acting as a scribe for Meriam Sprague, MD.,have documented all relevant documentation on the behalf of Meriam Sprague, MD,as directed by  Meriam Sprague, MD while in the presence of Meriam Sprague, MD.  I, Meriam Sprague, MD, have reviewed all documentation for this visit. The documentation on 07/19/22 for the exam, diagnosis, procedures, and orders are all accurate and complete.   Signed, Meriam Sprague, MD  07/19/2022 3:04 PM

## 2022-07-20 NOTE — Progress Notes (Signed)
Cardiology Office Note:    Date:  07/21/2022   ID:  Haley [name], DOB 04-04-1956, MRN [id number]  PCP:  Julieanne Manson, MD   [facility] Providers Cardiologist:  Meriam Sprague, MD {   Referring MD: Julieanne Manson, MD   History of Present Illness:    Haley [name] is a 66 y.o. female with a hx of nonobstructive CAD on cath in 2017, DMII, HTN, and HLD who was previously followed by Dr. Delton See who now presents to clinic for follow-up.  Per review of the record, patient underwent coronary CTA in 2017 with calcium score of 239 which was 93 percentile, moderate diffuse CAD possible obstructive.  Cardiac catheterization 10/2016 mild nonobstructive disease in the LAD, moderate stenosis in the mid to distal PDA 50%. LV function normal. Medical therapy recommended.  Saw Dr. Delton See on 04/23/20 with SOB and chest pressure. Underwent repeat LHC on 05/04/20 which showed nonobstructive CAD similar to prior.   Was last seen in clinic on 01/2022 where she was doing well from a CV standpoint. Was started on mounjaro for weight loss/DMII.    Today, the patient states that she is suffering from severe leg cramps occurring at night. She has been taking potassium since before her cramps were regularly frequent. Of note, she states that her leg cramps started "a while back." At one point she discontinued her rosuvastatin and had improvement in her myalgias. However, last week she resumed rosuvastatin and her cramps returned. At times her leg cramps are so severe it is difficult for her to walk.  She complains of occasional palpitations. They are less prominent now that she tries to avoid drinking coffee in favor of [place] alternatives. She denies any chest pain.  Also she is on magnesium supplements because she wasn't sleeping well at night. She did notice improvement in her sleep quality.  She is tolerating Mounjaro. She reports about 30lbs weight loss since starting.  She denies  any shortness of breath, or peripheral edema. No lightheadedness, headaches, syncope, orthopnea, or PND.   Past Medical History:  Diagnosis Date   Back pain    Coronary artery disease    Depression    DM type 2, controlled, with complication (HCC) 07/22/2013   Type II DM diagnosed in ~ 2006 per pt hx.    Environmental and seasonal allergies    Ganglion cyst of wrist, right 05/09/2017   Hyperlipidemia    Hypertension    Itching    Refusal of blood transfusions as patient is Jehovah's Witness    Shingles    Stroke (HCC) 2000   per pt. report:   TIA    Past Surgical History:  Procedure Laterality Date   ABDOMINAL HYSTERECTOMY  1994   Laparoscopic; ovaries still intact.  Performed for prolapsed uterus.   AREOLA/NIPPLE RECONSTRUCTION WITH GRAFT Bilateral 09/19/2021   Procedure: FREE NIPPLE GRAFT;  Surgeon: Allena Napoleon, MD;  Location: [facility] OR;  Service: Plastics;  Laterality: Bilateral;   BREAST REDUCTION SURGERY Bilateral 09/19/2021   Procedure: Bilateral Breast Reduction;  Surgeon: Allena Napoleon, MD;  Location: [facility] OR;  Service: Plastics;  Laterality: Bilateral;  2 hours   CARDIAC CATHETERIZATION N/A 10/23/2016   Procedure: Left Heart Cath and Coronary Angiography;  Surgeon: Kathleene Hazel, MD;  Location: [facility] INVASIVE CV LAB;  Service: Cardiovascular;  Laterality: N/A;   CHOLECYSTECTOMY  1997   Laparoscopic   COLONOSCOPY  2005-at age 53   Hyperplastic polyp--[place]   LEFT HEART CATH  AND CORONARY ANGIOGRAPHY N/A 05/04/2020   Procedure: LEFT HEART CATH AND CORONARY ANGIOGRAPHY;  Surgeon: Swazil[place], Peter M, MD;  Location: [facility] INVASIVE CV LAB;  Service: Cardiovascular;  Laterality: N/A;   REDUCTION MAMMAPLASTY Bilateral 09/19/2021   WISDOM TOOTH EXTRACTION      Current Medications: Current Meds  Medication Sig   amLODipine (NORVASC) 5 MG tablet 1 tab by mouth daily.   aspirin 81 MG tablet Take 81 mg by mouth daily.   Cholecalciferol (DIALYVITE VITAMIN D 5000) 125 MCG  (5000 UT) capsule Take 15,000 Units by mouth daily.   Cyanocobalamin (B-12 PO) Take 1 tablet by mouth daily.   fexofenadine (ALLEGRA) 180 MG tablet 1 tab by mouth daily for allergies   GARLIC PO Take 6,269 capsules by mouth daily.   ibuprofen (ADVIL) 200 MG tablet 2-4 tabs by mouth twice daily with meals as needed   Liniments (BLUE-EMU SUPER STRENGTH EX) Apply 1 application topically daily as needed (pain).   lisinopril (ZESTRIL) 40 MG tablet Take 1 tablet by mouth once daily   metoprolol succinate (TOPROL-XL) 50 MG 24 hr tablet Take 1 tablet (50 mg total) by mouth daily. Take with or immediately following a meal.   mometasone (NASONEX) 50 MCG/ACT nasal spray USE 2 SPRAY(S) IN EACH NOSTRIL ONCE DAILY   Multiple Vitamins-Minerals (MULTIVITAMIN WITH MINERALS) tablet Take 1 tablet by mouth daily.   nitroGLYCERIN (NITROSTAT) 0.4 MG SL tablet DISSOLVE ONE TABLET UNDER THE TONGUE EVERY 5 MINUTES AS NEEDED FOR CHEST PAIN.  DO NOT EXCEED A TOTAL OF 3 DOSES IN 15 MINUTES   omega-3 acid ethyl esters (LOVAZA) 1 g capsule Take 1 capsule (1 g total) by mouth 2 (two) times daily.   Potassium 95 MG TABS Take by mouth daily.   tirzepatide (MOUNJARO) 12.5 MG/0.5ML Pen Inject 12.5 mg into the skin once a week.   vitamin C (ASCORBIC ACID) 500 MG tablet Take 1,000 mg by mouth daily.   zinc gluconate 50 MG tablet Take 50 mg by mouth daily.   [DISCONTINUED] rosuvastatin (CRESTOR) 10 MG tablet Take 1 tablet (10 mg total) by mouth daily.     Allergies:   Praluent [alirocumab], Zetia [ezetimibe], Atorvastatin, and Pravastatin   Social History   Socioeconomic History   Marital status: Single    Spouse name: Not on file   Number of children: 2   Years of education: 12+   Highest education level: Some college, no degree  Occupational History   Occupation: Print production planner    Comment: Retired now  Tobacco Use   Smoking status: Never    Passive exposure: Never   Smokeless tobacco: Never  Vaping Use   Vaping Use:  Never used  Substance and Sexual Activity   Alcohol use: Yes    Alcohol/week: 0.0 standard drinks of alcohol    Comment: occasionally-1 to 2 drinks monthly    Drug use: No   Sexual activity: Not Currently    Birth control/protection: Surgical  Other Topics Concern   Not on file  Social History Narrative   Did get some post high school training/education   Lives alone   [place] 2 great grandchildren frequently   Daughter lives in [place] and checks in regularly.   Social Determinants of Health   Financial Resource Strain: Low Risk  (02/22/2021)   Overall Financial Resource Strain (CARDIA)    Difficulty of Paying Living Expenses: Not hard at all  Food Insecurity: No Food Insecurity (02/22/2021)   Hunger Vital Sign    Worried  About Running Out of Food in the Last Year: Never true    Ran Out of Food in the Last Year: Never true  Transportation Needs: No Transportation Needs (02/22/2021)   PRAPARE - Administrator, Civil ServiceTransportation    Lack of Transportation (Medical): No    Lack of Transportation (Non-Medical): No  Physical Activity: Insufficiently Active (07/09/2018)   Exercise Vital Sign    Days of Exercise per Week: 3 days    Minutes of Exercise per Session: 30 min  Stress: No Stress Concern Present (07/09/2018)   [employer] - Occupational Stress Questionnaire    Feeling of Stress : Only a little  Social Connections: Somewhat Isolated (07/09/2018)   Social Connection and Isolation Panel [NHANES]    Frequency of Communication with Friends and Family: More than three times a week    Frequency of Social Gatherings with Friends and Family: More than three times a week    Attends Religious Services: 1 to 4 times per year    Active Member of [employer] Financial[employer]ubs or Organizations: No    Attends Engineer, structuralClub or Organization Meetings: Never    Marital Status: Divorced     Family History: The patient's family history includes Cancer (age of onset: 5640) in her sister; Diabetes in her brother, father,  sister, and son; Hypertension in her brother and son; Seizures in her brother; Stroke in her maternal aunt.  ROS:   Please see the history of present illness.    Review of Systems  Constitutional:  Negative for chills and fever.  HENT:  Negative for sore throat.   Eyes:  Negative for blurred vision.  Respiratory:  Negative for shortness of breath.   Cardiovascular:  Positive for palpitations. Negative for chest pain, orthopnea, claudication, leg swelling and PND.  Gastrointestinal:  Negative for nausea and vomiting.  Genitourinary:  Negative for urgency.  Musculoskeletal:  Positive for joint pain and myalgias. Negative for falls.  Skin:  Negative for itching and rash.  Neurological:  Negative for dizziness and loss of consciousness.  Endo/Heme/Allergies:  Does not bruise/bleed easily.  Psychiatric/Behavioral:  Negative for substance abuse.      EKGs/Labs/Other Studies Reviewed:    The following studies were reviewed today:  2D-Echo 05/12/20 IMPRESSIONS   1. Left ventricular ejection fraction, by estimation, is 60 to 65%. The left ventricle has normal function. The left ventricle has no regional wall motion abnormalities. There is mild concentric left ventricular hypertrophy. Left ventricular diastolic parameters are indeterminate.   2. Right ventricular systolic function is normal. The right ventricular size is normal. Tricuspid regurgitation signal is inadequate for assessing PA pressure.   3. The mitral valve is normal in structure. Trivial mitral valve  regurgitation. No evidence of mitral stenosis.   4. The aortic valve is tricuspid. Aortic valve regurgitation is not visualized. No aortic stenosis is present.   5. The inferior vena cava is normal in size with greater than 50% respiratory variability, suggesting right atrial pressure of 3 mmHg.   Comparison(s): No significant change from prior study.   Conclusion(s)/Recommendation(s): Normal biventricular function without evidence  of hemodynamically significant valvular heart disease.  Cardiac Cath 05/04/20 Ost LAD to Prox LAD lesion is 30% stenosed. RPDA lesion is 50% stenosed. The left ventricular systolic function is normal. LV end diastolic pressure is mildly elevated. The left ventricular ejection fraction is 55-65% by visual estimate.   1. Nonobstructive CAD. Unchanged from 2017. 2. Normal LV function 3. Mildly elevated LVEDP 20 mm Hg   Plan: medical management.  Coronary CTA 08/14/2016: IMPRESSION: 1. Coronary calcium score of 239. This was 60 percentile for age and sex matched control.   2. Normal coronary origin with right dominance.   3. 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 We will send additional analysis for CT FFT evaluation once available.   4. Mildly dilated pulmonary artery measuring 31 x 27 mm suspicious for pulmonary hypertension.    EKG:  EKG is personally reviewed. 07/21/2022:  Sinus rhythm. Rate 80 bpm.   Recent Labs: 02/24/2022: ALT 27; BUN 14; Creatinine, Ser 0.97; Hemoglobin 14.3; Platelets 269; Potassium 4.4; Sodium 144   Recent Lipid Panel    Component Value Date/Time   CHOL 134 02/24/2022 1107   TRIG 101 02/24/2022 1107   HDL 50 02/24/2022 1107   LDLCALC 65 02/24/2022 1107   LDLDIRECT 134 (H) 07/22/2013 1620          Physical Exam:    VS:  BP 126/80   Pulse 80   Ht 5' 1.75" (1.568 m)   Wt 202 lb 9.6 oz (91.9 kg)   SpO2 95%   BMI 37.36 kg/m     Wt Readings from Last 3 Encounters:  07/21/22 202 lb 9.6 oz (91.9 kg)  03/29/22 214 lb (97.1 kg)  02/24/22 217 lb (98.4 kg)     GEN:  Well nourished, well developed in no acute distress HEENT: Normal NECK: No JVD; No carotid bruits CARDIAC: RRR, no murmurs, rubs, gallops RESPIRATORY:  Clear to auscultation without rales, wheezing or rhonchi  ABDOMEN: Soft, non-tender, non-distended MUSCULOSKELETAL:  No edema; No deformity. LE tenderness on palpation  SKIN: Warm and dry NEUROLOGIC:  Alert and oriented x  3 PSYCHIATRIC:  Normal affect   ASSESSMENT:    1. Coronary artery disease involving native coronary artery of native heart without angina pectoris   2. Medication management   3. Class 2 severe obesity due to excess calories with serious comorbidity and body mass index (BMI) of 37.0 to 37.9 in adult (HCC)   4. DM type 2, controlled, with complication (HCC)   5. Mixed hyperlipidemia   6. Essential hypertension   7. Palpitations   8. Hyperlipidemia, unspecified hyperlipidemia type   9. Statin intolerance      PLAN:    In order of problems listed above:  #Nonobstructive CAD: Patient with moderate, non-obstructive CAD on cath in 2021 similar to that seen in 2017. No current anginal symptoms. Active and walks daily. Not tolerating statins due to significant myalgias. Has tried praulent and zetia in the past and had issues with them as well. Possibly can try inclisiran. Will refer back to lipid clinic -Continue ASA 81mg  daily -Continue lisinopril 40mg  daily -Continue metop 50mg  XL daily -Continue ranexa 500mg  BID  -Not tolerating crestor; will refer to lipid clinic  -Imdur stopped due to HA  #HTN: Very well controlled at home and at goal <120s/80s.  -Continue lisinopril 40mg  daily -Continue metop 50mg  XL daily -Continue amlodipine 5mg  daily  #HLD: LDL 65 which is at goal for <70. Not tolerating statins due to significant myalgias (has tried crestor, lipitor, and pravastatin). Has tried praulent and zetia in the past and had issues with them as well. Possibly can try inclisiran. Will refer back to lipid clinic. -Not tolerating crestor -Will refer to lipid clinic for consideration of inclisiran  #Palpitations: Significantly improved with cutting out caffeine. -Continue metop 50mg  XL daily  #DMII: -Now on Mounjaro with A1C 6.2  #Morbid Obesity: BMI dropped from 41>37. Continue diet and lifestyle as detailed  below as well as mounjaro.  Exercise recommendations: Goal of  exercising for at least 30 minutes a day, at least 5 times per week.  Please exercise to a moderate exertion.  This means that while exercising it is difficult to speak in full sentences, however you are not so short of breath that you feel you must stop, and not so comfortable that you can carry on a full conversation.  Exertion level should be approximately a 5/10, if 10 is the most exertion you can perform.  Diet recommendations: Recommend a heart healthy diet such as the Mediterranean diet.  This diet consists of plant based foods, healthy fats, lean meats, olive oil.  It suggests limiting the intake of simple carbohydrates such as white breads, pastries, and pastas.  It also limits the amount of red meat, wine, and dairy products such as cheese that one should consume on a daily basis.   Follow-up:  6 months.    Medication Adjustments/Labs and Tests Ordered: Current medicines are reviewed at length with the patient today.  Concerns regarding medicines are outlined above.   Orders Placed This Encounter  Procedures   AMB Referral to [facility] Pharm[facility]   EKG 12-Lead   No orders of the defined types were placed in this encounter.  Patient Instructions  Medication Instructions:   STOP TAKING CRESTOR NOW  *If you need a refill on your cardiac medications before your next appointment, please call your pharmacy*   You have been referred to OUR LIPID CLINIC HERE IN THE OFFICE TO SEE THE PHARMACIST FOR FURTHER MANAGEMENT OF LIPIDS   Follow-Up: At [facility], you and your health needs are our priority.  As part of our continuing mission to provide you with exceptional heart care, we have created designated Provider Care Teams.  These Care Teams include your primary Cardiologist (physician) and Advanced Practice Providers (APPs -  Physician Assistants and Nurse Practitioners) who all work together to provide you with the care you need, when you need it.  We recommend signing up for  the patient portal called "MyChart".  Sign up information is provided on this After Visit Summary.  MyChart is used to connect with patients for Virtual Visits (Telemedicine).  Patients are able to view lab/test results, encounter notes, upcoming appointments, etc.  Non-urgent messages can be sent to your provider as well.   To learn more about what you can do with MyChart, go to [email or web address].    Your next appointment:   6 month(s)  The format for your next appointment:   In Person  Provider:   Meriam Sprague, MD      Important Information About Sugar         I,Mathew Stumpf,acting as a scribe for Meriam Sprague, MD.,have documented all relevant documentation on the behalf of Meriam Sprague, MD,as directed by  Meriam Sprague, MD while in the presence of Meriam Sprague, MD.  I, Meriam Sprague, MD, have reviewed all documentation for this visit. The documentation on 07/21/22 for the exam, diagnosis, procedures, and orders are all accurate and complete.   Signed, Meriam Sprague, MD  07/21/2022 8:31 AM

## 2022-07-21 ENCOUNTER — Encounter: Payer: Self-pay | Admitting: Cardiology

## 2022-07-21 ENCOUNTER — Ambulatory Visit: Payer: Medicare Other | Attending: Cardiology | Admitting: Cardiology

## 2022-07-21 VITALS — BP 126/80 | HR 80 | Ht 61.75 in | Wt 202.6 lb

## 2022-07-21 DIAGNOSIS — Z6837 Body mass index (BMI) 37.0-37.9, adult: Secondary | ICD-10-CM

## 2022-07-21 DIAGNOSIS — E118 Type 2 diabetes mellitus with unspecified complications: Secondary | ICD-10-CM

## 2022-07-21 DIAGNOSIS — Z79899 Other long term (current) drug therapy: Secondary | ICD-10-CM | POA: Diagnosis not present

## 2022-07-21 DIAGNOSIS — E782 Mixed hyperlipidemia: Secondary | ICD-10-CM

## 2022-07-21 DIAGNOSIS — R002 Palpitations: Secondary | ICD-10-CM

## 2022-07-21 DIAGNOSIS — E785 Hyperlipidemia, unspecified: Secondary | ICD-10-CM

## 2022-07-21 DIAGNOSIS — Z789 Other specified health status: Secondary | ICD-10-CM

## 2022-07-21 DIAGNOSIS — I2510 Atherosclerotic heart disease of native coronary artery without angina pectoris: Secondary | ICD-10-CM | POA: Diagnosis not present

## 2022-07-21 DIAGNOSIS — I10 Essential (primary) hypertension: Secondary | ICD-10-CM

## 2022-07-21 NOTE — Patient Instructions (Signed)
Medication Instructions:   STOP TAKING CRESTOR NOW  *If you need a refill on your cardiac medications before your next appointment, please call your pharmacy*   You have been referred to OUR LIPID CLINIC HERE IN THE OFFICE TO SEE THE PHARMACIST FOR FURTHER MANAGEMENT OF LIPIDS   Follow-Up: At [facility], you and your health needs are our priority.  As part of our continuing mission to provide you with exceptional heart care, we have created designated Provider Care Teams.  These Care Teams include your primary Cardiologist (physician) and Advanced Practice Providers (APPs -  Physician Assistants and Nurse Practitioners) who all work together to provide you with the care you need, when you need it.  We recommend signing up for the patient portal called "MyChart".  Sign up information is provided on this After Visit Summary.  MyChart is used to connect with patients for Virtual Visits (Telemedicine).  Patients are able to view lab/test results, encounter notes, upcoming appointments, etc.  Non-urgent messages can be sent to your provider as well.   To learn more about what you can do with MyChart, go to [email or web address].    Your next appointment:   6 month(s)  The format for your next appointment:   In Person  Provider:   Meriam Sprague, MD      Important Information About Sugar

## 2022-07-25 MED ORDER — MOUNJARO 15 MG/0.5ML ~~LOC~~ SOAJ
15.0000 mg | SUBCUTANEOUS | 3 refills | Status: DC
Start: 2022-07-25 — End: 2023-01-15

## 2022-07-25 NOTE — Addendum Note (Signed)
Addended by: Marcelle Overlie D on: 07/25/2022 11:50 AM   Modules accepted: Orders

## 2022-07-25 NOTE — Telephone Encounter (Signed)
Called pt. She is doing well on Mounjaro 12.5mg  weekly. States no side effects. Would like to increase to 15mg . Would like a 90 day supply sent in. Rx sent. Will follow up in 4 weeks.

## 2022-08-07 ENCOUNTER — Telehealth: Payer: Self-pay

## 2022-08-07 NOTE — Telephone Encounter (Signed)
Patient would like an appointment for right foot pain for the past 3 weeks. Pain is on the top of her foot. Hurts to walk. Has swelling during the day, then swelling goes down at night while she rests her foot. Patient is not using any medication. Has also noticed a knot on the back of her foot. Has not happened before

## 2022-08-09 ENCOUNTER — Encounter: Payer: Self-pay | Admitting: Internal Medicine

## 2022-08-09 ENCOUNTER — Ambulatory Visit (INDEPENDENT_AMBULATORY_CARE_PROVIDER_SITE_OTHER): Payer: Medicare Other | Admitting: Internal Medicine

## 2022-08-09 VITALS — BP 138/88 | HR 68 | Resp 16 | Ht 61.75 in | Wt 201.0 lb

## 2022-08-09 DIAGNOSIS — E118 Type 2 diabetes mellitus with unspecified complications: Secondary | ICD-10-CM

## 2022-08-09 DIAGNOSIS — M79671 Pain in right foot: Secondary | ICD-10-CM

## 2022-08-09 DIAGNOSIS — Z79899 Other long term (current) drug therapy: Secondary | ICD-10-CM

## 2022-08-09 DIAGNOSIS — E7800 Pure hypercholesterolemia, unspecified: Secondary | ICD-10-CM | POA: Diagnosis not present

## 2022-08-09 DIAGNOSIS — I10 Essential (primary) hypertension: Secondary | ICD-10-CM

## 2022-08-09 DIAGNOSIS — Z6837 Body mass index (BMI) 37.0-37.9, adult: Secondary | ICD-10-CM

## 2022-08-09 DIAGNOSIS — M79672 Pain in left foot: Secondary | ICD-10-CM

## 2022-08-09 NOTE — Progress Notes (Signed)
Subjective:    Patient ID: Haley [name], female   DOB: May 03, 1956, 66 y.o.   MRN: [id number]   HPI   DM/obesity:  [place] in March of 2023.  Just increased last month by cardiology on the 15th of Sept to 15 mg weekly.  She is down to 201 from start of 217.5 lbs in March.  Has not had A1C since April, soon after [facility] initiated.  No GI complaints.    2.  Hypercholesterolemia:  had to stop Rosuvastatin due to worsening leg cramps.  Three days after stopping, cramping resolved.  Cramps returned immediately once back on the Rosuvastatin.  Could hardly walk.  Cardiology sending her to lipid clinic for possible treatment with inclisiran.  Has tried praulent and zetia in past with side effects as well.   She believes the appt will be sometime this month.  Last cholesterol with LDL at goal at 65.    3.  Hypertension:  BP okay today.  States her BP is generally good at home.  109-120 over 60-70s.  Occasionally, will have a high systolic, but then down to good range soon after with recheck.    4.  Bilateral foot pain with right much worse than left.  Foot is swollen.  Hurts at ball of foot when bearing weight and also across dorsal foot at same area.    Current Meds  Medication Sig   amLODipine (NORVASC) 5 MG tablet 1 tab by mouth daily.   aspirin 81 MG tablet Take 81 mg by mouth daily.   Cholecalciferol (DIALYVITE VITAMIN D 5000) 125 MCG (5000 UT) capsule Take 15,000 Units by mouth daily.   Cyanocobalamin (B-12 PO) Take 1 tablet by mouth daily.   fexofenadine (ALLEGRA) 180 MG tablet 1 tab by mouth daily for allergies   GARLIC PO Take 7,616 capsules by mouth daily.   ibuprofen (ADVIL) 200 MG tablet 2-4 tabs by mouth twice daily with meals as needed   Liniments (BLUE-EMU SUPER STRENGTH EX) Apply 1 application topically daily as needed (pain).   lisinopril (ZESTRIL) 40 MG tablet Take 1 tablet by mouth once daily   metoprolol succinate (TOPROL-XL) 50 MG 24 hr tablet Take 1 tablet (50  mg total) by mouth daily. Take with or immediately following a meal.   mometasone (NASONEX) 50 MCG/ACT nasal spray USE 2 SPRAY(S) IN EACH NOSTRIL ONCE DAILY   Multiple Vitamins-Minerals (MULTIVITAMIN WITH MINERALS) tablet Take 1 tablet by mouth daily.   nitroGLYCERIN (NITROSTAT) 0.4 MG SL tablet DISSOLVE ONE TABLET UNDER THE TONGUE EVERY 5 MINUTES AS NEEDED FOR CHEST PAIN.  DO NOT EXCEED A TOTAL OF 3 DOSES IN 15 MINUTES   omega-3 acid ethyl esters (LOVAZA) 1 g capsule Take 1 capsule (1 g total) by mouth 2 (two) times daily.   Potassium 95 MG TABS Take by mouth daily.   tirzepatide (MOUNJARO) 15 MG/0.5ML Pen Inject 15 mg into the skin once a week.   vitamin C (ASCORBIC ACID) 500 MG tablet Take 1,000 mg by mouth daily.   zinc gluconate 50 MG tablet Take 50 mg by mouth daily.   Allergies  Allergen Reactions   Praluent [Alirocumab]     Had significant fatigue and memory loss--was lost driving in town.  Joint pain and nausea as well.   Zetia [Ezetimibe] Other (See Comments)    Joint pain and ear ringing   Atorvastatin Other (See Comments)    Pt states causes bilateral lower extremity muscle cramps   Pravastatin Nausea  And Vomiting and Other (See Comments)    Causes dizziness     Review of Systems    Objective:   BP 138/88 (BP Location: Right Arm, Patient Position: Sitting, Cuff Size: Normal)   Pulse 68   Resp 16   Ht 5' 1.75" (1.568 m)   Wt 201 lb (91.2 kg)   BMI 37.06 kg/m   Physical Exam NAD  Diabetic Foot Exam - Simple   Simple Foot Form Diabetic Foot exam was performed with the following findings: Yes 08/09/2022  1:32 PM  Visual Inspection No deformities, no ulcerations, no other skin breakdown bilaterally: Yes Sensation Testing Intact to touch and monofilament testing bilaterally: Yes Pulse Check Posterior Tibialis and Dorsalis pulse intact bilaterally: Yes Comments Bilateral feet very tender R>>L across all metatarsal heads, more so on plantar aspect.  Right very  tender along medial length of arch and also plantar heel less so.  Right tender also over flexor tendons extending up from medial arch and also lateral tendons coursing down to lateral plantar foot as well.     Assessment & Plan   DM/obesity: [place] recently increased.  Check A1C.    2.  Hypertension:  controlled.  CMP  3.  Foot pain:  not clear what primary issue is:  referral to podiatry.  Recommended Diclofenac gel 3-4 times daily as needed.  4.  Hypercholesterolemia:  as per Cardiology and lipid clinic.  5.  HM:   To obtain COVID vaccine at favorite pharmacy.

## 2022-08-09 NOTE — Telephone Encounter (Signed)
Seen by Dr Mulberry 

## 2022-08-11 LAB — COMPREHENSIVE METABOLIC PANEL
ALT: 24 IU/L (ref 0–32)
AST: 26 IU/L (ref 0–40)
Albumin/Globulin Ratio: 1.4 (ref 1.2–2.2)
Albumin: 4.1 g/dL (ref 3.9–4.9)
Alkaline Phosphatase: 63 IU/L (ref 44–121)
BUN/Creatinine Ratio: 15 (ref 12–28)
BUN: 16 mg/dL (ref 8–27)
Bilirubin Total: 0.4 mg/dL (ref 0.0–1.2)
CO2: 17 mmol/L — ABNORMAL LOW (ref 20–29)
Calcium: 9.8 mg/dL (ref 8.7–10.3)
Chloride: 103 mmol/L (ref 96–106)
Creatinine, Ser: 1.04 mg/dL — ABNORMAL HIGH (ref 0.57–1.00)
Globulin, Total: 3 g/dL (ref 1.5–4.5)
Glucose: 81 mg/dL (ref 70–99)
Potassium: 5.7 mmol/L — ABNORMAL HIGH (ref 3.5–5.2)
Sodium: 141 mmol/L (ref 134–144)
Total Protein: 7.1 g/dL (ref 6.0–8.5)
eGFR: 59 mL/min/{1.73_m2} — ABNORMAL LOW (ref >59)

## 2022-08-11 LAB — HGB A1C W/O EAG

## 2022-08-14 ENCOUNTER — Other Ambulatory Visit (INDEPENDENT_AMBULATORY_CARE_PROVIDER_SITE_OTHER): Payer: Medicare Other

## 2022-08-14 DIAGNOSIS — E875 Hyperkalemia: Secondary | ICD-10-CM | POA: Diagnosis not present

## 2022-08-15 ENCOUNTER — Ambulatory Visit (INDEPENDENT_AMBULATORY_CARE_PROVIDER_SITE_OTHER): Payer: Medicare Other

## 2022-08-15 ENCOUNTER — Ambulatory Visit (INDEPENDENT_AMBULATORY_CARE_PROVIDER_SITE_OTHER): Payer: Medicare Other | Admitting: Podiatry

## 2022-08-15 ENCOUNTER — Encounter: Payer: Self-pay | Admitting: Podiatry

## 2022-08-15 DIAGNOSIS — M778 Other enthesopathies, not elsewhere classified: Secondary | ICD-10-CM

## 2022-08-15 DIAGNOSIS — M722 Plantar fascial fibromatosis: Secondary | ICD-10-CM | POA: Diagnosis not present

## 2022-08-15 LAB — BASIC METABOLIC PANEL
BUN/Creatinine Ratio: 12 (ref 12–28)
BUN: 13 mg/dL (ref 8–27)
CO2: 21 mmol/L (ref 20–29)
Calcium: 9.7 mg/dL (ref 8.7–10.3)
Chloride: 102 mmol/L (ref 96–106)
Creatinine, Ser: 1.09 mg/dL — ABNORMAL HIGH (ref 0.57–1.00)
Glucose: 79 mg/dL (ref 70–99)
Potassium: 4 mmol/L (ref 3.5–5.2)
Sodium: 138 mmol/L (ref 134–144)
eGFR: 55 mL/min/{1.73_m2} — ABNORMAL LOW (ref >59)

## 2022-08-15 MED ORDER — METHYLPREDNISOLONE 4 MG PO TBPK: ORAL_TABLET | ORAL | 0 refills | Status: DC

## 2022-08-15 MED ORDER — TRIAMCINOLONE ACETONIDE 40 MG/ML IJ SUSP
20.0000 mg | Freq: Once | INTRAMUSCULAR | Status: AC
  Administered 2022-08-15: 20 mg

## 2022-08-15 NOTE — Progress Notes (Signed)
Subjective:  Patient ID: Haley [name], female    DOB: Jan 14, 1956,  MRN: [id number] HPI Chief Complaint  Patient presents with   Foot Pain    Plantar forefoot/dorsal midfoot/anterior ankle right - aching, throbbing x 2 months, no injury, now left foot is starting to hurt same way, no treatment   New Patient (Initial Visit)    66 y.o. female presents with the above complaint.   ROS: Denies fever chills nausea vomit muscle aches pains calf pain back pain chest pain shortness of breath.  Past Medical History:  Diagnosis Date   Back pain    Coronary artery disease    Depression    DM type 2, controlled, with complication ([place]) 18/56/3149   Type II DM diagnosed in ~ 2006 per pt hx.    Environmental and seasonal allergies    Ganglion cyst of wrist, right 05/09/2017   Hyperlipidemia    Hypertension    Itching    Refusal of blood transfusions as patient is Jehovah's Witness    Shingles    Stroke ([place]) 2000   per pt. report:   TIA   Past Surgical History:  Procedure Laterality Date   ABDOMINAL HYSTERECTOMY  1994   Laparoscopic; ovaries still intact.  Performed for prolapsed uterus.   AREOLA/NIPPLE RECONSTRUCTION WITH GRAFT Bilateral 09/19/2021   Procedure: FREE NIPPLE GRAFT;  Surgeon: Cindra Presume, MD;  Location: [facility];  Service: Plastics;  Laterality: Bilateral;   BREAST REDUCTION SURGERY Bilateral 09/19/2021   Procedure: Bilateral Breast Reduction;  Surgeon: Cindra Presume, MD;  Location: [facility];  Service: Plastics;  Laterality: Bilateral;  2 hours   CARDIAC CATHETERIZATION N/A 10/23/2016   Procedure: Left Heart Cath and Coronary Angiography;  Surgeon: Burnell Blanks, MD;  Location: [facility];  Service: Cardiovascular;  Laterality: N/A;   CHOLECYSTECTOMY  1997   Laparoscopic   COLONOSCOPY  2005-at age 46   Hyperplastic polyp--[place]   LEFT HEART CATH AND CORONARY ANGIOGRAPHY N/A 05/04/2020   Procedure: LEFT HEART CATH AND CORONARY ANGIOGRAPHY;   Surgeon: Martinique, Peter M, MD;  Location: [facility];  Service: Cardiovascular;  Laterality: N/A;   REDUCTION MAMMAPLASTY Bilateral 09/19/2021   WISDOM TOOTH EXTRACTION      Current Outpatient Medications:    methylPREDNISolone (MEDROL DOSEPAK) 4 MG TBPK tablet, 6 day dose pack - take as directed, Disp: 21 tablet, Rfl: 0   amLODipine (NORVASC) 5 MG tablet, 1 tab by mouth daily., Disp: 90 tablet, Rfl: 3   aspirin 81 MG tablet, Take 81 mg by mouth daily., Disp: , Rfl:    Cholecalciferol (DIALYVITE VITAMIN D 5000) 125 MCG (5000 UT) capsule, Take 15,000 Units by mouth daily., Disp: , Rfl:    Cyanocobalamin (B-12 PO), Take 1 tablet by mouth daily., Disp: , Rfl:    fexofenadine (ALLEGRA) 180 MG tablet, 1 tab by mouth daily for allergies, Disp: , Rfl:    GARLIC PO, Take 7,026 capsules by mouth daily., Disp: , Rfl:    ibuprofen (ADVIL) 200 MG tablet, 2-4 tabs by mouth twice daily with meals as needed, Disp: 30 tablet, Rfl: 0   Liniments (BLUE-EMU SUPER STRENGTH EX), Apply 1 application topically daily as needed (pain)., Disp: , Rfl:    lisinopril (ZESTRIL) 40 MG tablet, Take 1 tablet by mouth once daily, Disp: 90 tablet, Rfl: 3   metoprolol succinate (TOPROL-XL) 50 MG 24 hr tablet, Take 1 tablet (50 mg total) by mouth daily. Take with or immediately following a  meal., Disp: 90 tablet, Rfl: 2   mometasone (NASONEX) 50 MCG/ACT nasal spray, USE 2 SPRAY(S) IN EACH NOSTRIL ONCE DAILY, Disp: 51 g, Rfl: 3   Multiple Vitamins-Minerals (MULTIVITAMIN WITH MINERALS) tablet, Take 1 tablet by mouth daily., Disp: , Rfl:    nitroGLYCERIN (NITROSTAT) 0.4 MG SL tablet, DISSOLVE ONE TABLET UNDER THE TONGUE EVERY 5 MINUTES AS NEEDED FOR CHEST PAIN.  DO NOT EXCEED A TOTAL OF 3 DOSES IN 15 MINUTES, Disp: 25 tablet, Rfl: 0   omega-3 acid ethyl esters (LOVAZA) 1 g capsule, Take 1 capsule (1 g total) by mouth 2 (two) times daily., Disp: 180 capsule, Rfl: 3   Potassium 95 MG TABS, Take by mouth daily., Disp: , Rfl:     tirzepatide (MOUNJARO) 15 MG/0.5ML Pen, Inject 15 mg into the skin once a week., Disp: 6 mL, Rfl: 3   vitamin C (ASCORBIC ACID) 500 MG tablet, Take 1,000 mg by mouth daily., Disp: , Rfl:    zinc gluconate 50 MG tablet, Take 50 mg by mouth daily., Disp: , Rfl:   Allergies  Allergen Reactions   Praluent [Alirocumab]     Had significant fatigue and memory loss--was lost driving in town.  Joint pain and nausea as well.   Zetia [Ezetimibe] Other (See Comments)    Joint pain and ear ringing   Atorvastatin Other (See Comments)    Pt states causes bilateral lower extremity muscle cramps   Pravastatin Nausea And Vomiting and Other (See Comments)    Causes dizziness   Review of Systems Objective:  There were no vitals filed for this visit.  General: Well developed, nourished, in no acute distress, alert and oriented x3   Dermatological: Skin is warm, dry and supple bilateral. Nails x 10 are well maintained; remaining integument appears unremarkable at this time. There are no open sores, no preulcerative lesions, no rash or signs of infection present.  Vascular: Dorsalis Pedis artery and Posterior Tibial artery pedal pulses are 2/4 bilateral with immedate capillary fill time. Pedal hair growth present. No varicosities and no lower extremity edema present bilateral.   Neruologic: Grossly intact via light touch bilateral. Vibratory intact via tuning fork bilateral. Protective threshold with Semmes Wienstein monofilament intact to all pedal sites bilateral. Patellar and Achilles deep tendon reflexes 2+ bilateral. No Babinski or clonus noted bilateral.   Musculoskeletal: No gross boney pedal deformities bilateral. No pain, crepitus, or limitation noted with foot and ankle range of motion bilateral. Muscular strength 5/5 in all groups tested bilateral.  Pain on palpation medial calcaneal tubercles bilateral right greater than left.  She also has tenderness on palpation of the Achilles right greater than  left and tenderness on palpation of the forefoot.  Gait: Unassisted, Nonantalgic.    Radiographs:  Radiographs taken today demonstrate an osseously mature individual soft tissue increase in density plantar fascial Caney insertion site no acute findings otherwise.  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    Plan: Discussed etiology pathology conservative therapies due to her heart problems we just will start her on methylprednisolone injected the right heel and place her plantar fascia brace.  She will follow-up with me in 1 month     Doyl Bitting T. [place], [place]

## 2022-08-23 ENCOUNTER — Ambulatory Visit: Payer: Medicare Other | Admitting: Internal Medicine

## 2022-08-28 NOTE — Telephone Encounter (Signed)
Spoke to patient via telephone regarding her increase in [place]. She reports tolerating 15 mg of Mounjaro well. She reports mild nausea initially with the dose increase, however that has resolved completely. Fasting BG is usually ~90-110 and pt denies any hypoglycemia. Pt reports a total loss of 26 lbs as of today. Pt encouraged to reach out with any questions or concerns while taking Mounjaro.

## 2022-08-30 ENCOUNTER — Ambulatory Visit: Payer: Medicare Other | Attending: Internal Medicine | Admitting: Student

## 2022-08-30 DIAGNOSIS — E785 Hyperlipidemia, unspecified: Secondary | ICD-10-CM | POA: Diagnosis not present

## 2022-08-30 NOTE — Patient Instructions (Signed)
Changes made by your pharmacist Cammy Copa, PharmD at today's visit:    Instructions/Changes  (what do you need to do) Your Notes  (what you did and when you did it)  1.Try taking Rosuvastatin 10 mg half tablet and see if you could tolerate    2. Will apply for PA for Repatha  will call you once I hear back     Repatha is a cholesterol medication that improved your body's ability to get rid of "bad cholesterol" known as LDL. It can lower your LDL up to 60%! It is an injection that is given under the skin every 2 weeks. The medication often requires a prior authorization from your insurance company. We will take care of submitting all the necessary information to your insurance company to get it approved. The most common side effects of Repatha include runny nose, symptoms of the common cold, rarely flu or flu-like symptoms, back/muscle pain in about 3-4% of the patients, and redness, pain, or bruising at the injection site. Tell your healthcare provider if you have any side effect that bothers you or that does not go away.      If you have any questions or concerns please use My Chart to send questions or call the office at [phone]

## 2022-08-30 NOTE — Progress Notes (Signed)
Patient ID: Haley [name]                 DOB: September 05, 1956                    MRN: [id number]    HPI: Haley [name] is a 66 y.o. female patient referred to lipid clinic by Dr. Shari Prows. PMH is significant for stroke, CAD, T2DM, hypertension, hyperlipidemia.  Per review of the record, patient underwent coronary CTA in 2017 with calcium score of 239 which was 93 percentile, moderate diffuse CAD possible obstructive.  Cardiac catheterization 10/2016 mild nonobstructive disease in the LAD, moderate stenosis in the mid to distal PDA 50%. LV function normal. Medical therapy recommended.  Saw Dr. Delton See on 04/23/20 with SOB and chest pressure. Underwent repeat LHC on 05/04/20 which showed nonobstructive CAD similar to prior. Patient started [facility] 01/2022 for weight loss and T2DM. Patient had intolerance to rosuvastatin (leg cramps- difficulty walking)  Today no acute concern reported. Patient is not taking rosuvastatin as it was giving the leg cramps. Patient reports tolerating lovaza and garlic.Tolkerating it well without side effects. Patient had tried many stains but end up getting muscle pain. In the past patient had tried Praluent too. While on that drug there was one time incidence of getting lost around the neighborhood and she thought it was from medication so she had stopped taking it. After discussion of risk vs benefits of hyperlipidemic medications and its benefits patient is in agreement to try rosuvastatin 5 mg instead of 10 mg per day and open to try different PCSK9i. At the time of last lipid lab  patient was on rosuvastatin 10 mg.  Patient had stop going for regular walks every other day due to cold weather. However motivated to start doing chair exercises during winter months.    Current Medications: Lovaza 1 gm twice daily, Garlic  Intolerances: Praluent( memory loss)  atorvastatin (leg cramps), pravastatin (leg cramps)  and Zetia (ringing in the ears) Risk Factors: previous stroke,  hypertension T2DM, CAD  LDL goal: <55 mg/dl   Diet: eat healthy diet generally, grilled, airfry, or backed lean meat, eats lots of greens, eat out twice month but make healthy choices.   Exercise: used to walk outside 2-3 mile every other day during warm weather. Now that it is dark outside early morning does not go for walk. Suggested some resistant exercise and find a inside place for walking e.g mall or start walking in the afternoon instead of morning   Family History: Stroke - Mother side family, Brother and Son both has T2DM.   Social History:  EtOH: socially - 1-2 drinks per month  Tobacco: -ve Labs: Lipid Panel     Component Value Date/Time   CHOL 134 02/24/2022 1107   TRIG 101 02/24/2022 1107   HDL 50 02/24/2022 1107   LDLCALC 65 02/24/2022 1107   LDLDIRECT 134 (H) 07/22/2013 1620   LABVLDL 19 02/24/2022 1107    Past Medical History:  Diagnosis Date   Back pain    Coronary artery disease    Depression    DM type 2, controlled, with complication ([facility]) 07/22/2013   Type II DM diagnosed in ~ 2006 per pt hx.    Environmental and seasonal allergies    Ganglion cyst of wrist, right 05/09/2017   Hyperlipidemia    Hypertension    Itching    Refusal of blood transfusions as patient is Jehovah's Witness    Shingles  Stroke [facility]) 2000   per pt. report:   TIA    Current Outpatient Medications on File Prior to Visit  Medication Sig Dispense Refill   amLODipine (NORVASC) 5 MG tablet 1 tab by mouth daily. 90 tablet 3   aspirin 81 MG tablet Take 81 mg by mouth daily.     Cholecalciferol (DIALYVITE VITAMIN D 5000) 125 MCG (5000 UT) capsule Take 15,000 Units by mouth daily.     Cyanocobalamin (B-12 PO) Take 1 tablet by mouth daily.     fexofenadine (ALLEGRA) 180 MG tablet 1 tab by mouth daily for allergies     GARLIC PO Take 7,124 capsules by mouth daily.     ibuprofen (ADVIL) 200 MG tablet 2-4 tabs by mouth twice daily with meals as needed 30 tablet 0   Liniments  (BLUE-EMU SUPER STRENGTH EX) Apply 1 application topically daily as needed (pain).     lisinopril (ZESTRIL) 40 MG tablet Take 1 tablet by mouth once daily 90 tablet 3   methylPREDNISolone (MEDROL DOSEPAK) 4 MG TBPK tablet 6 day dose pack - take as directed 21 tablet 0   metoprolol succinate (TOPROL-XL) 50 MG 24 hr tablet Take 1 tablet (50 mg total) by mouth daily. Take with or immediately following a meal. 90 tablet 2   mometasone (NASONEX) 50 MCG/ACT nasal spray USE 2 SPRAY(S) IN EACH NOSTRIL ONCE DAILY 51 g 3   Multiple Vitamins-Minerals (MULTIVITAMIN WITH MINERALS) tablet Take 1 tablet by mouth daily.     nitroGLYCERIN (NITROSTAT) 0.4 MG SL tablet DISSOLVE ONE TABLET UNDER THE TONGUE EVERY 5 MINUTES AS NEEDED FOR CHEST PAIN.  DO NOT EXCEED A TOTAL OF 3 DOSES IN 15 MINUTES 25 tablet 0   omega-3 acid ethyl esters (LOVAZA) 1 g capsule Take 1 capsule (1 g total) by mouth 2 (two) times daily. 180 capsule 3   Potassium 95 MG TABS Take by mouth daily.     tirzepatide (MOUNJARO) 15 MG/0.5ML Pen Inject 15 mg into the skin once a week. 6 mL 3   vitamin C (ASCORBIC ACID) 500 MG tablet Take 1,000 mg by mouth daily.     zinc gluconate 50 MG tablet Take 50 mg by mouth daily.     No current facility-administered medications on file prior to visit.    Allergies  Allergen Reactions   Praluent [Alirocumab]     Had significant fatigue and memory loss--was lost driving in town.  Joint pain and nausea as well.   Zetia [Ezetimibe] Other (See Comments)    Joint pain and ear ringing   Atorvastatin Other (See Comments)    Pt states causes bilateral lower extremity muscle cramps   Pravastatin Nausea And Vomiting and Other (See Comments)    Causes dizziness    Hyperlipidemia Assessment:  LDLc goal <55 mg/dl ;hx of stroke  Patient have stopped taking rosuvastatin 10 mg as it was causing leg cramps  Taking Lovaza and garlic reports tolerating it well LDLc level of 65 in April 2023 is when patient was on  rosuvastatin 10 mg. considering good response from rosuvastatin encouraged patient to try 5 mg instead of 10 mg and see if she could tolerate it better Patient understand the long term risk of high LDLc and importance of achieving the target LDLc  Patient is ready to try PCSK9i  Educated patient on appropriate use of Repatha and what to expect once start the therapy  Plan:  Start taking 1/2 tablet of rosuvastatin 10 mg daily  Will apply  for Repatha PA and inform patient via MyChart  Patient to go for lipid lab in 3 months post initiation of Repatha  Follow up with PharmD in 5 weeks to assess tolerability of the statins and Repatha.   Thank you,  Cammy Copa, Pharm.D [facility] [address] [place], [place] [address]  Phone: [phone]; Fax: [phone]

## 2022-08-31 ENCOUNTER — Telehealth: Payer: Self-pay | Admitting: Pharmacist

## 2022-08-31 DIAGNOSIS — E785 Hyperlipidemia, unspecified: Secondary | ICD-10-CM

## 2022-08-31 MED ORDER — REPATHA SURECLICK 140 MG/ML ~~LOC~~ SOAJ: 140.0000 mg | SUBCUTANEOUS | 11 refills | Status: DC

## 2022-08-31 NOTE — Assessment & Plan Note (Addendum)
Assessment:   LDLc goal <55 mg/dl ;hx of stroke   Patient have stopped taking rosuvastatin 10 mg as it was causing leg cramps   Taking Lovaza and garlic reports tolerating it well  LDLc level of 65 in April 2023 is when patient was on rosuvastatin 10 mg. considering good response from rosuvastatin encouraged patient to try 5 mg instead of 10 mg and see if she could tolerate it better  Patient understand the long term risk of high LDLc and importance of achieving the target LDLc   Patient is ready to try PCSK9i   Educated patient on appropriate use of Repatha and what to expect once she starts the therapy  Plan:   Start taking 1/2 tablet of rosuvastatin 10 mg daily   Will apply for Repatha PA and inform patient via MyChart   Patient to go for lipid lab in 3 months post initiation of Repatha   Follow up with PharmD in 5 weeks to assess tolerability of the statins and Repatha.

## 2022-08-31 NOTE — Telephone Encounter (Signed)
PA for Repatha has been submitted on Oct 26  ([place])

## 2022-08-31 NOTE — Telephone Encounter (Signed)
Repatha PA approved exp: 03/02/2023

## 2022-09-26 ENCOUNTER — Ambulatory Visit: Payer: Medicare Other | Admitting: Podiatry

## 2022-10-03 NOTE — Telephone Encounter (Signed)
Spoke with patient. She is taking Repatha. Doing well. Did not try the 5mg  of rosuvastatin. Just taking Repatha. Will check labs 12/5.

## 2022-10-03 NOTE — Addendum Note (Signed)
Addended by: Jerlean Peralta E on: 10/03/2022 08:13 AM   Modules accepted: Orders

## 2022-10-10 ENCOUNTER — Other Ambulatory Visit: Payer: Medicare Other

## 2022-10-10 ENCOUNTER — Ambulatory Visit: Payer: Medicare Other

## 2022-10-11 ENCOUNTER — Ambulatory Visit: Payer: Medicare Other | Attending: Cardiology

## 2022-10-11 DIAGNOSIS — E785 Hyperlipidemia, unspecified: Secondary | ICD-10-CM

## 2022-10-11 LAB — HEPATIC FUNCTION PANEL
ALT: 24 IU/L (ref 0–32)
AST: 17 IU/L (ref 0–40)
Albumin: 4 g/dL (ref 3.9–4.9)
Alkaline Phosphatase: 70 IU/L (ref 44–121)
Bilirubin Total: 0.4 mg/dL (ref 0.0–1.2)
Bilirubin, Direct: 0.13 mg/dL (ref 0.00–0.40)
Total Protein: 6.5 g/dL (ref 6.0–8.5)

## 2022-10-11 LAB — LIPID PANEL
Chol/HDL Ratio: 3.1 ratio (ref 0.0–4.4)
Cholesterol, Total: 144 mg/dL (ref 100–199)
HDL: 46 mg/dL (ref >39)
LDL Chol Calc (NIH): 80 mg/dL (ref 0–99)
Triglycerides: 99 mg/dL (ref 0–149)
VLDL Cholesterol Cal: 18 mg/dL (ref 5–40)

## 2022-10-13 ENCOUNTER — Telehealth: Payer: Self-pay | Admitting: Pharmacist

## 2022-10-13 NOTE — Telephone Encounter (Signed)
LDLc still above the gaol (<55 mg/dl) on Repatha.discussed the result with patient and patient is willing to try 5 mg rosuvastatin 3 days per week. Patient to follow up on tolerability in couple weeks via MyChart. Next potential option could be adding ezetimibe to Repatha to get goal LDLc

## 2022-10-13 NOTE — Progress Notes (Signed)
LDLc still above the gaol (<55 mg/dl) on Repatha.Discussed the result with patient and patient is willing to try 5 mg rosuvastatin 3 days per week. Patient to follow up on tolerability in couple weeks via MyChart. Next potential option could be adding ezetimibe to Repatha to get goal LDLc

## 2022-10-24 NOTE — Telephone Encounter (Signed)
Spoke to patient. She could not tolerate rosuvastatin low dose 3 times per week. Gets bothersome muscle cramps. Patient is not willing to try Zetia. She has tinnitus,in the past Zetia aggravated tinnitus to the point where she was not able to sleep at night.  We agreed upon continuing Repatha.

## 2022-10-29 ENCOUNTER — Emergency Department (HOSPITAL_COMMUNITY): Payer: Medicare Other

## 2022-10-29 ENCOUNTER — Encounter (HOSPITAL_COMMUNITY): Payer: Self-pay | Admitting: Emergency Medicine

## 2022-10-29 ENCOUNTER — Emergency Department (HOSPITAL_COMMUNITY)
Admission: EM | Admit: 2022-10-29 | Discharge: 2022-10-29 | Disposition: A | Discharge status: Home / Self Care | Payer: Medicare Other | Attending: Emergency Medicine | Admitting: Emergency Medicine

## 2022-10-29 DIAGNOSIS — Z79899 Other long term (current) drug therapy: Secondary | ICD-10-CM | POA: Insufficient documentation

## 2022-10-29 DIAGNOSIS — Z7982 Long term (current) use of aspirin: Secondary | ICD-10-CM | POA: Diagnosis not present

## 2022-10-29 DIAGNOSIS — R002 Palpitations: Secondary | ICD-10-CM | POA: Diagnosis present

## 2022-10-29 DIAGNOSIS — I2510 Atherosclerotic heart disease of native coronary artery without angina pectoris: Secondary | ICD-10-CM | POA: Diagnosis not present

## 2022-10-29 DIAGNOSIS — R0602 Shortness of breath: Secondary | ICD-10-CM | POA: Diagnosis not present

## 2022-10-29 DIAGNOSIS — I10 Essential (primary) hypertension: Secondary | ICD-10-CM | POA: Diagnosis not present

## 2022-10-29 LAB — CBC WITH DIFFERENTIAL/PLATELET
Abs Immature Granulocytes: 0.02 10*3/uL (ref 0.00–0.07)
Basophils Absolute: 0.1 10*3/uL (ref 0.0–0.1)
Basophils Relative: 1 %
Eosinophils Absolute: 0.2 10*3/uL (ref 0.0–0.5)
Eosinophils Relative: 3 %
HCT: 42.8 % (ref 36.0–46.0)
Hemoglobin: 14.1 g/dL (ref 12.0–15.0)
Immature Granulocytes: 0 %
Lymphocytes Relative: 34 %
Lymphs Abs: 2.1 10*3/uL (ref 0.7–4.0)
MCH: 31.5 pg (ref 26.0–34.0)
MCHC: 32.9 g/dL (ref 30.0–36.0)
MCV: 95.7 fL (ref 80.0–100.0)
Monocytes Absolute: 0.6 10*3/uL (ref 0.1–1.0)
Monocytes Relative: 10 %
Neutro Abs: 3.1 10*3/uL (ref 1.7–7.7)
Neutrophils Relative %: 52 %
Platelets: 291 10*3/uL (ref 150–400)
RBC: 4.47 MIL/uL (ref 3.87–5.11)
RDW: 12.4 % (ref 11.5–15.5)
WBC: 6 10*3/uL (ref 4.0–10.5)
nRBC: 0 % (ref 0.0–0.2)

## 2022-10-29 LAB — BASIC METABOLIC PANEL
Anion gap: 5 (ref 5–15)
BUN: 13 mg/dL (ref 8–23)
CO2: 24 mmol/L (ref 22–32)
Calcium: 9.6 mg/dL (ref 8.9–10.3)
Chloride: 106 mmol/L (ref 98–111)
Creatinine, Ser: 1.07 mg/dL — ABNORMAL HIGH (ref 0.44–1.00)
GFR, Estimated: 57 mL/min — ABNORMAL LOW (ref >60)
Glucose, Bld: 102 mg/dL — ABNORMAL HIGH (ref 70–99)
Potassium: 3.7 mmol/L (ref 3.5–5.1)
Sodium: 135 mmol/L (ref 135–145)

## 2022-10-29 LAB — TROPONIN I (HIGH SENSITIVITY)
Troponin I (High Sensitivity): 4 ng/L (ref <18)
Troponin I (High Sensitivity): 4 ng/L (ref <18)

## 2022-10-29 LAB — URINALYSIS, ROUTINE W REFLEX MICROSCOPIC
Bilirubin Urine: NEGATIVE
Glucose, UA: NEGATIVE mg/dL
Ketones, ur: NEGATIVE mg/dL
Leukocytes,Ua: NEGATIVE
Nitrite: NEGATIVE
Protein, ur: NEGATIVE mg/dL
Specific Gravity, Urine: 1.003 — ABNORMAL LOW (ref 1.005–1.030)
pH: 6 (ref 5.0–8.0)

## 2022-10-29 NOTE — ED Provider Triage Note (Signed)
Emergency Medicine Provider Triage Evaluation Note  Haley [name] , a 66 y.o. female  was evaluated in triage.  Pt complains of HTN and palpitations.  States it started earlier this morning and woke her from sleep.  States pressures were 220s/110s. She did take SL NTG which helped somewhat but feels like BP is crawling back up.  She denies any frank chest pain.  No fever/cough.  Denies nausea/vomiting but reports a lot of belching.  Review of Systems  Positive: HTN, palpitations Negative: fever  Physical Exam  BP (!) 151/86 (BP Location: Right Arm)   Pulse 84   Temp 98.6 F (37 C) (Oral)   Resp 20   SpO2 96%   Gen:   Awake, no distress   Resp:  Normal effort  MSK:   Moves extremities without difficulty  Other:    Medical Decision Making  Medically screening exam initiated at 3:55 AM.  Appropriate orders placed.  [name] was informed that the remainder of the evaluation will be completed by another provider, this initial triage assessment does not replace that evaluation, and the importance of remaining in the ED until their evaluation is complete.  HTN, palpitations.  BP 151/86 in triage.  EKG, labs, CXR.   Garlon Hatchet, PA-C 10/29/22 0400

## 2022-10-29 NOTE — Discharge Instructions (Addendum)
Please take your home blood pressure medications Please recheck with your doctor this week Return to the emergency department if you have any return of symptoms. Follow-up with your cardiologist for possible monitor placement

## 2022-10-29 NOTE — ED Triage Notes (Signed)
PT reports heart's rapid HR woke her up and chest pain. SHe states started at 0200. She was hypertensive and she took 1 nitro and that did not help with that. Pt denies pain but feels like she cannot get enough oxygen when she breathes. No respiratory distress or work of breathing. No hx of heart attack.

## 2022-10-29 NOTE — ED Provider Notes (Signed)
[facility] EMERGENCY DEPARTMENT Provider Note   CSN: [id number] Arrival date & time: 10/29/22  0340     History  Chief Complaint  Patient presents with   Shortness of Breath    Haley [name] is a 66 y.o. female.  HPI 66 year old female presents today complaining of palpitations.  She states she awoke at 02 100 a.m. and felt like her heart was beating abnormally and quickly.  She changed positions in the bed and felt like it was pounding into the right side of her chest.  She sat up on the side of the bed.  She then went into the kitchen.  She has some belching with this.  She took apple cider vinegar without relief.  She also took a nitroglycerin but had no burning from this.  She noted that her blood pressure was elevated.  She did not take her normal a.m. medications as it was too early.  She called her friend and was brought into the ED.  After she got here to the ED before she had her EKG the symptoms resolved. She has a known history of CAD with minimal disease on last catheterization and has never had an MI.    Home Medications Prior to Admission medications   Medication Sig Start Date End Date Taking? Authorizing Provider  amLODipine (NORVASC) 5 MG tablet 1 tab by mouth daily. 03/29/22   Julieanne Manson, MD  aspirin 81 MG tablet Take 81 mg by mouth daily.    [provider]  Cholecalciferol (DIALYVITE VITAMIN D 5000) 125 MCG (5000 UT) capsule Take 15,000 Units by mouth daily.    [provider]  Cyanocobalamin (B-12 PO) Take 1 tablet by mouth daily.    [provider]  Evolocumab (REPATHA SURECLICK) 140 MG/ML SOAJ Inject 140 mg into the skin every 14 (fourteen) days. 08/31/22   Meriam Sprague, MD  fexofenadine (ALLEGRA) 180 MG tablet 1 tab by mouth daily for allergies 03/29/22   Julieanne Manson, MD  GARLIC PO Take 1,093 capsules by mouth daily.    [provider]  ibuprofen (ADVIL) 200 MG tablet 2-4 tabs by  mouth twice daily with meals as needed 01/25/21   Julieanne Manson, MD  Liniments (BLUE-EMU SUPER STRENGTH EX) Apply 1 application topically daily as needed (pain).    [provider]  lisinopril (ZESTRIL) 40 MG tablet Take 1 tablet by mouth once daily 03/21/22   Julieanne Manson, MD  methylPREDNISolone (MEDROL DOSEPAK) 4 MG TBPK tablet 6 day dose pack - take as directed 08/15/22   Hyatt, Max T, DPM  metoprolol succinate (TOPROL-XL) 50 MG 24 hr tablet Take 1 tablet (50 mg total) by mouth daily. Take with or immediately following a meal. 07/14/22   Julieanne Manson, MD  mometasone (NASONEX) 50 MCG/ACT nasal spray USE 2 SPRAY(S) IN EACH NOSTRIL ONCE DAILY 08/15/21   Julieanne Manson, MD  Multiple Vitamins-Minerals (MULTIVITAMIN WITH MINERALS) tablet Take 1 tablet by mouth daily.    [provider]  nitroGLYCERIN (NITROSTAT) 0.4 MG SL tablet DISSOLVE ONE TABLET UNDER THE TONGUE EVERY 5 MINUTES AS NEEDED FOR CHEST PAIN.  DO NOT EXCEED A TOTAL OF 3 DOSES IN 15 MINUTES 07/14/22   Julieanne Manson, MD  omega-3 acid ethyl esters (LOVAZA) 1 g capsule Take 1 capsule (1 g total) by mouth 2 (two) times daily. 08/15/21   Julieanne Manson, MD  Potassium 95 MG TABS Take by mouth daily.    [provider]  tirzepatide Greggory Keen) 15  MG/0.5ML Pen Inject 15 mg into the skin once a week. 07/25/22   Freada Bergeron, MD  vitamin C (ASCORBIC ACID) 500 MG tablet Take 1,000 mg by mouth daily.    [provider]  zinc gluconate 50 MG tablet Take 50 mg by mouth daily.    [provider]      Allergies    Praluent [alirocumab], Zetia [ezetimibe], Atorvastatin, and Pravastatin    Review of Systems   Review of Systems  Physical Exam Updated Vital Signs BP (!) 157/98 (BP Location: Right Arm)   Pulse 77   Temp 97.9 F (36.6 C) (Oral)   Resp 16   SpO2 99%  Physical Exam Vitals and nursing note reviewed.  Constitutional:      Appearance: She is  well-developed.  HENT:     Head: Normocephalic.  Eyes:     Pupils: Pupils are equal, round, and reactive to light.  Cardiovascular:     Rate and Rhythm: Normal rate and regular rhythm.  Pulmonary:     Effort: Pulmonary effort is normal.     Breath sounds: Normal breath sounds.  Chest:     Chest wall: No mass or deformity.  Abdominal:     General: Bowel sounds are normal.     Palpations: Abdomen is soft.  Musculoskeletal:        General: Normal range of motion.     Cervical back: Normal range of motion.     Right lower leg: No tenderness. No edema.     Left lower leg: No tenderness. No edema.  Skin:    General: Skin is warm and dry.     Capillary Refill: Capillary refill takes less than 2 seconds.  Neurological:     General: No focal deficit present.     Mental Status: She is alert.  Psychiatric:        Mood and Affect: Mood normal.     ED Results / Procedures / Treatments   Labs (all labs ordered are listed, but only abnormal results are displayed) Labs Reviewed  BASIC METABOLIC PANEL - Abnormal; Notable for the following components:      Result Value   Glucose, Bld 102 (*)    Creatinine, Ser 1.07 (*)    GFR, Estimated 57 (*)    All other components within normal limits  URINALYSIS, ROUTINE W REFLEX MICROSCOPIC - Abnormal; Notable for the following components:   Color, Urine COLORLESS (*)    Specific Gravity, Urine 1.003 (*)    Hgb urine dipstick MODERATE (*)    Bacteria, UA RARE (*)    All other components within normal limits  CBC WITH DIFFERENTIAL/PLATELET  TROPONIN I (HIGH SENSITIVITY)  TROPONIN I (HIGH SENSITIVITY)    EKG EKG Interpretation  Date/Time:  Sunday October 29 2022 03:43:52 EST Ventricular Rate:  86 PR Interval:  178 QRS Duration: 80 QT Interval:  364 QTC Calculation: 435 R Axis:   -17 Text Interpretation: Normal sinus rhythm Low voltage QRS Possible Anterolateral infarct , age undetermined Abnormal ECG When compared with ECG of  19-Jun-2018 21:14, PREVIOUS ECG IS PRESENT Since last tracing rate slower Confirmed by Pattricia Boss [phone]) on 10/29/2022 10:31:38 AM  Radiology DG Chest 2 View  Result Date: 10/29/2022 CLINICAL DATA:  Hypertension and palpitation EXAM: CHEST - 2 VIEW COMPARISON:  None Available. FINDINGS: Normal heart size and mediastinal contours. No acute infiltrate or edema. No effusion or pneumothorax. No acute osseous findings. Generalized degenerative endplate ridging. Cholecystectomy clips. IMPRESSION: No active  cardiopulmonary disease. Electronically Signed   By: Jorje Guild M.D.   On: 10/29/2022 05:02    Procedures Procedures    Medications Ordered in ED Medications - No data to display  ED Course/ Medical Decision Making/ A&P Clinical Course as of 10/29/22 1053  Sun Oct 29, 2022  [id number] Basic metabolic panel reviewed and interpreted significant for normal electrolytes, glucose, and creatinine [DR]  1050 CBC reviewed interpreted and within normal limits [DR]  1050 Troponin and repeat troponin are 4 with no change [DR]  1051 Chest x-Alaria Oconnor reviewed interpreted with no active cardiopulmonary disease and radiologist interpretation concurs [DR]    Clinical Course User Index [DR] Pattricia Boss, MD                           Medical Decision Making 66 year old female this with episode of palpitations, hypertension and some dyspnea associated. Differential diagnosis includes but is not limited to acute coronary syndrome, arrhythmia including A-fib, infection, PE, Zizzi diseases of the great vessel, diseases of the upper abdomen including gastritis, pancreatitis, cholecystitis Patient was evaluated with EKG which shows a normal sinus rhythm.  This was obtained after her symptoms had resolved. She was evaluated with 2 troponins both of which are normal Given patient's lack of chest pain and normal troponins doubt MI or acute coronary syndrome Chest x-Huzaifa Viney is obtained and shows no evidence of  infiltrate or pneumothorax Patient's abdomen is soft and nontender and does not appear to have abdominal etiology By history this seems most consistent with arrhythmia which would most likely be atrial fibrillation.  Patient has no prior history.  Her symptoms have completely resolved. We have discussed return precautions and need for follow-up and possible outpatient monitoring. Patient voices understanding of this plan. Blood pressures currently systolically [id number].  Patient will take her home medication and follow.   Amount and/or Complexity of Data Reviewed Labs: ordered. Decision-making details documented in ED Course. Radiology: independent interpretation performed. Decision-making details documented in ED Course. ECG/medicine tests: ordered and independent interpretation performed. Decision-making details documented in ED Course.  Risk Decision regarding hospitalization.           Final Clinical Impression(s) / ED Diagnoses Final diagnoses:  Palpitations  Hypertension, unspecified type    Rx / DC Orders ED Discharge Orders     None         Pattricia Boss, MD 10/29/22 1053

## 2022-11-01 NOTE — Progress Notes (Unsigned)
Office Visit    Patient Name: Haley [name] Date of Encounter: 11/01/2022  Primary Care Provider:  Julieanne Manson, MD Primary Cardiologist:  Meriam Sprague, MD Primary Electrophysiologist: None  Chief Complaint    Haley [name] is a 66 y.o. female with PMH of nonobstructive CAD s/p LHC 2017, HTN, HLD, DM type II who presents today for recent ED visit for palpitations and chest pain.  Past Medical History    Past Medical History:  Diagnosis Date   Back pain    Coronary artery disease    Depression    DM type 2, controlled, with complication (HCC) 07/22/2013   Type II DM diagnosed in ~ 2006 per pt hx.    Environmental and seasonal allergies    Ganglion cyst of wrist, right 05/09/2017   Hyperlipidemia    Hypertension    Itching    Refusal of blood transfusions as patient is Jehovah's Witness    Shingles    Stroke (HCC) 2000   per pt. report:   TIA   Past Surgical History:  Procedure Laterality Date   ABDOMINAL HYSTERECTOMY  1994   Laparoscopic; ovaries still intact.  Performed for prolapsed uterus.   AREOLA/NIPPLE RECONSTRUCTION WITH GRAFT Bilateral 09/19/2021   Procedure: FREE NIPPLE GRAFT;  Surgeon: Allena Napoleon, MD;  Location: [facility] OR;  Service: Plastics;  Laterality: Bilateral;   BREAST REDUCTION SURGERY Bilateral 09/19/2021   Procedure: Bilateral Breast Reduction;  Surgeon: Allena Napoleon, MD;  Location: [facility] OR;  Service: Plastics;  Laterality: Bilateral;  2 hours   CARDIAC CATHETERIZATION N/A 10/23/2016   Procedure: Left Heart Cath and Coronary Angiography;  Surgeon: Kathleene Hazel, MD;  Location: [facility] INVASIVE CV LAB;  Service: Cardiovascular;  Laterality: N/A;   CHOLECYSTECTOMY  1997   Laparoscopic   COLONOSCOPY  2005-at age 28   Hyperplastic polyp--[place]   LEFT HEART CATH AND CORONARY ANGIOGRAPHY N/A 05/04/2020   Procedure: LEFT HEART CATH AND CORONARY ANGIOGRAPHY;  Surgeon: [place], Peter M, MD;  Location: [facility] INVASIVE CV LAB;   Service: Cardiovascular;  Laterality: N/A;   REDUCTION MAMMAPLASTY Bilateral 09/19/2021   WISDOM TOOTH EXTRACTION      Allergies  Allergies  Allergen Reactions   Praluent [Alirocumab]     Had significant fatigue and memory loss--was lost driving in town.  Joint pain and nausea as well.   Zetia [Ezetimibe] Other (See Comments)    Joint pain and ear ringing   Atorvastatin Other (See Comments)    Pt states causes bilateral lower extremity muscle cramps   Pravastatin Nausea And Vomiting and Other (See Comments)    Causes dizziness    History of Present Illness    Haley [name]  is a 66 year old female with the above mention past medical history who presents today for ED follow-up of chest pain and palpitations.  Haley [name] was initially seen by Dr. Delton See in 2017 for complaint of palpitations and exertional dyspnea.  She had 48-hour monitor that showed PVCs.  She had 2D echo completed that showed biventricular function with no significant valvular abnormalities and mild LVH.  She also underwent Lexiscan stress test that revealed no prior scar or ischemia.  She underwent cardiac CTA that showed calcium score 239 with moderate diffuse CAD.  She was seen on 10/04/2016 with complaint of shortness of breath but no chest pain.  She had LHC performed 10/2016 that revealed 3% stenosis in the right PDA with medical management recommended.  She was  seen 10/16/2018 with complaint of shortness of breath and chest discomfort that required nitroglycerin.  She had exercise Myoview completed that showed normal LV function with no arrhythmia or ischemia.  She was lost to follow-up until 6/thousand 21 when she presented with plaint of dyspnea and chest pain.  She was sent for repeat LHC that revealed no changes from previous cath and 2017.  She was last seen by Dr. Johney Frame in 01/2022 for follow-up.  During visit patient was doing well with no complaint of palpitations or chest pain.  She reported recently joining  a gym and was requesting to be taken off of beta-blocker.  She had Imdur stopped due to headache but continued on Ranexa 500 mg twice daily.  She presented to the ED on 10/29/2022 with complaint of palpitations and dyspnea.  EKG was completed that showed sinus rhythm serial troponins were normal.  She had complete resolution of symptoms and patient was discharged home.  Ms. Deantonio presents today with her daughter for post ED follow-up of chest discomfort and palpitations.  Since last being seen in the office patient reports that she has not experienced any more discomfort and has occasional bouts of palpitations but not be the palpitations that she felt prior to ED visit.  Her blood pressure today was slightly elevated at 142/82 she reports normally her blood pressures are in the mid [id number] systolically.  She is compliant with her current medication versus reactions.  She reports occasional shortness of breath and chest discomfort with exertion.  During our visit today patient denied any chest discomfort or chest pain.  EKG was also obtained during visit that showed sinus rhythm without any acute abnormalities.  She denies PND, orthopnea, nausea, vomiting, dizziness, syncope, edema, weight gain, or early satiety.  Home Medications    Current Outpatient Medications  Medication Sig Dispense Refill   amLODipine (NORVASC) 5 MG tablet 1 tab by mouth daily. 90 tablet 3   aspirin 81 MG tablet Take 81 mg by mouth daily.     Cholecalciferol (DIALYVITE VITAMIN D 5000) 125 MCG (5000 UT) capsule Take 15,000 Units by mouth daily.     Cyanocobalamin (B-12 PO) Take 1 tablet by mouth daily.     Evolocumab (REPATHA SURECLICK) [id number] MG/ML SOAJ Inject 140 mg into the skin every 14 (fourteen) days. 2 mL 11   fexofenadine (ALLEGRA) 180 MG tablet 1 tab by mouth daily for allergies     GARLIC PO Take [id number] capsules by mouth daily.     ibuprofen (ADVIL) 200 MG tablet 2-4 tabs by mouth twice daily with meals as needed 30 tablet  0   Liniments (BLUE-EMU SUPER STRENGTH EX) Apply 1 application topically daily as needed (pain).     lisinopril (ZESTRIL) 40 MG tablet Take 1 tablet by mouth once daily 90 tablet 3   methylPREDNISolone (MEDROL DOSEPAK) 4 MG TBPK tablet 6 day dose pack - take as directed 21 tablet 0   metoprolol succinate (TOPROL-XL) 50 MG 24 hr tablet Take 1 tablet (50 mg total) by mouth daily. Take with or immediately following a meal. 90 tablet 2   mometasone (NASONEX) 50 MCG/ACT nasal spray USE 2 SPRAY(S) IN EACH NOSTRIL ONCE DAILY 51 g 3   Multiple Vitamins-Minerals (MULTIVITAMIN WITH MINERALS) tablet Take 1 tablet by mouth daily.     nitroGLYCERIN (NITROSTAT) 0.4 MG SL tablet DISSOLVE ONE TABLET UNDER THE TONGUE EVERY 5 MINUTES AS NEEDED FOR CHEST PAIN.  DO NOT EXCEED A TOTAL OF 3 DOSES IN 15  MINUTES 25 tablet 0   omega-3 acid ethyl esters (LOVAZA) 1 g capsule Take 1 capsule (1 g total) by mouth 2 (two) times daily. 180 capsule 3   Potassium 95 MG TABS Take by mouth daily.     tirzepatide (MOUNJARO) 15 MG/0.5ML Pen Inject 15 mg into the skin once a week. 6 mL 3   vitamin C (ASCORBIC ACID) 500 MG tablet Take 1,000 mg by mouth daily.     zinc gluconate 50 MG tablet Take 50 mg by mouth daily.     No current facility-administered medications for this visit.     Review of Systems  Please see the history of present illness.    (+) Chest discomfort (+) Shortness of breath with exertion, palpitations  All other systems reviewed and are otherwise negative except as noted above.  Physical Exam    Wt Readings from Last 3 Encounters:  08/09/22 201 lb (91.2 kg)  07/21/22 202 lb 9.6 oz (91.9 kg)  03/29/22 214 lb (97.1 kg)   [id number] were no vitals filed for this visit.,There is no height or weight on file to calculate BMI.  Constitutional:      Appearance: Healthy appearance. Not in distress.  Neck:     Vascular: JVD normal.  Pulmonary:     Effort: Pulmonary effort is normal.     Breath sounds: No  wheezing. No rales. Diminished in the bases Cardiovascular:     Normal rate. Regular rhythm. Normal S1. Normal S2.      Murmurs: There is no murmur.  Edema:    Peripheral edema absent.  Abdominal:     Palpations: Abdomen is soft non tender. There is no hepatomegaly.  Skin:    General: Skin is warm and dry.  Neurological:     General: No focal deficit present.     Mental Status: Alert and oriented to person, place and time.     Cranial Nerves: Cranial nerves are intact.  EKG/LABS/Other Studies Reviewed    ECG personally reviewed by me today -sinus rhythm with rate of 76 bpm and no acute changes consistent with previous EKG.  Lab Results  Component Value Date   WBC 6.0 10/29/2022   HGB 14.1 10/29/2022   HCT 42.8 10/29/2022   MCV 95.7 10/29/2022   PLT 291 10/29/2022   Lab Results  Component Value Date   CREATININE 1.07 (H) 10/29/2022   BUN 13 10/29/2022   NA 135 10/29/2022   K 3.7 10/29/2022   CL 106 10/29/2022   CO2 24 10/29/2022   Lab Results  Component Value Date   ALT 24 10/11/2022   AST 17 10/11/2022   ALKPHOS 70 10/11/2022   BILITOT 0.4 10/11/2022   Lab Results  Component Value Date   CHOL 144 10/11/2022   HDL 46 10/11/2022   LDLCALC 80 10/11/2022   LDLDIRECT 134 (H) 07/22/2013   TRIG 99 10/11/2022   CHOLHDL 3.1 10/11/2022    Lab Results  Component Value Date   HGBA1C CANCELED 08/09/2022    Assessment & Plan    1.  Nonobstructive CAD: -s/p LHC in 2017 and 2021 with nonobstructive CAD noted.  She is currently not on statin due to myalgias and has also failed PCSK9 and Zetia.   -Today she reports -Continue current GDMT with ASA 81 mg, Toprol-XL 50 mg, Ranexa 500 mg twice daily  2.  Palpitations: -Today patient reports that her palpitations are not as frequent or heavy in occurrence. -Continue Toprol XL 50 mg daily -We  will check TSH and T4 today  3.  Essential hypertension: -Patient's blood pressure today was slightly elevated at 142/82 -She was  advised to continue to follow at home -Continue lisinopril 40 mg daily  4.  Hyperlipidemia: Patient's most recent LDL is 65 -She was recently referred to lipid clinic for consideration of inclisiran  5.  Chest pain: -Patient reports episodes of chest pain with radiation to the left arm that occurs with heavy exertion. -We will order Lexiscan Myoview to evaluate for possible ischemic changes -Patient was advised to use as needed nitroglycerin if chest pain becomes persistent and is not relieved with rest.  6.  Abnormal creatinine level: -Patient has history of DM type II and creatinine remains elevated at 1.09 or above. -Ambulatory referral to nephrology  Disposition: Follow-up with Freada Bergeron, MD or APP in 2 months    Medication Adjustments/Labs and Tests Ordered: Current medicines are reviewed at length with the patient today.  Concerns regarding medicines are outlined above.   Signed, Mable Fill, Marissa Nestle, NP 11/01/2022, 1:09 PM [facility]  Note:  This document was prepared using Dragon voice recognition software and may include unintentional dictation errors.

## 2022-11-02 ENCOUNTER — Ambulatory Visit: Payer: Medicare Other | Attending: Nurse Practitioner

## 2022-11-02 ENCOUNTER — Ambulatory Visit: Payer: Medicare Other | Attending: Nurse Practitioner | Admitting: Nurse Practitioner

## 2022-11-02 ENCOUNTER — Encounter: Payer: Self-pay | Admitting: Nurse Practitioner

## 2022-11-02 VITALS — BP 142/82 | HR 76 | Ht 61.0 in | Wt 198.0 lb

## 2022-11-02 DIAGNOSIS — R002 Palpitations: Secondary | ICD-10-CM

## 2022-11-02 DIAGNOSIS — R7989 Other specified abnormal findings of blood chemistry: Secondary | ICD-10-CM

## 2022-11-02 DIAGNOSIS — R0789 Other chest pain: Secondary | ICD-10-CM | POA: Diagnosis not present

## 2022-11-02 DIAGNOSIS — R0602 Shortness of breath: Secondary | ICD-10-CM | POA: Diagnosis not present

## 2022-11-02 DIAGNOSIS — I2510 Atherosclerotic heart disease of native coronary artery without angina pectoris: Secondary | ICD-10-CM | POA: Diagnosis not present

## 2022-11-02 MED ORDER — METOPROLOL SUCCINATE ER 50 MG PO TB24: 50.0000 mg | ORAL_TABLET | ORAL | 1 refills | Status: DC | PRN

## 2022-11-02 MED ORDER — NITROGLYCERIN 0.4 MG SL SUBL: SUBLINGUAL_TABLET | SUBLINGUAL | 3 refills | Status: DC

## 2022-11-02 NOTE — Patient Instructions (Addendum)
Medication Instructions:   START TAKING: TOPROL XL 50 MG  AS NEEDED FOR PALPITATIONS  *If you need a refill on your cardiac medications before your next appointment, please call your pharmacy*   Lab Work:  TSH AND FT4 TODAY    If you have labs (blood work) drawn today and your tests are completely normal, you will receive your results only by: MyChart Message (if you have MyChart) OR A paper copy in the mail If you have any lab test that is abnormal or we need to change your treatment, we will call you to review the results.   Testing/Procedures: Your physician has requested that you have an echocardiogram. Echocardiography is a painless test that uses sound waves to create images of your heart. It provides your doctor with information about the size and shape of your heart and how well your heart's chambers and valves are working. This procedure takes approximately one hour. There are no restrictions for this procedure. Please do NOT wear cologne, perfume, aftershave, or lotions (deodorant is allowed). Please arrive 15 minutes prior to your appointment time.  Your physician has requested that you have a lexiscan myoview. For further information please visit [email or web address]. Please follow instruction sheet, as given.  Your physician has recommended that you wear an event monitor. Event monitors are medical devices that record the heart's electrical activity. Doctors most often us these monitors to diagnose arrhythmias. Arrhythmias are problems with the speed or rhythm of the heartbeat. The monitor is a small, portable device. You can wear one while you do your normal daily activities. This is usually used to diagnose what is causing palpitations/syncope (passing out).   Follow-Up: At [facility], you and your health needs are our priority.  As part of our continuing mission to provide you with exceptional heart care, we have created designated Provider Care Teams.  These Care  Teams include your primary Cardiologist (physician) and Advanced Practice Providers (APPs -  Physician Assistants and Nurse Practitioners) who all work together to provide you with the care you need, when you need it.  We recommend signing up for the patient portal called "MyChart".  Sign up information is provided on this After Visit Summary.  MyChart is used to connect with patients for Virtual Visits (Telemedicine).  Patients are able to view lab/test results, encounter notes, upcoming appointments, etc.  Non-urgent messages can be sent to your provider as well.   To learn more about what you can do with MyChart, go to [email or web address].    Your next appointment:  You have been referred to :  Nephrology   1-2 month(s)  The format for your next appointment:   In Person  Provider:   Robin SearingErnest Dick, NP     Other Instructions   ZIO XT- Long Term Monitor Instructions  Your physician has requested you wear a ZIO patch monitor for 14 days.  This is a single patch monitor. [employer] supplies one patch monitor per enrollment. Additional stickers are not available. Please do not apply patch if you will be having a Nuclear Stress Test,  Echocardiogram, Cardiac CT, MRI, or Chest Xray during the period you would be wearing the  monitor. The patch cannot be worn during these tests. You cannot remove and re-apply the  ZIO XT patch monitor.  Your ZIO patch monitor will be mailed 3 day USPS to your address on file. It may take 3-5 days  to receive your monitor after you have been enrolled.  Once you have  received your monitor, please review the enclosed instructions. Your monitor  has already been registered assigning a specific monitor serial # to you.  Billing and Patient Assistance Program Information  We have supplied [employer] with any of your insurance information on file for billing purposes. [employer] offers a sliding scale Patient Assistance Program for patients that do not have   insurance, or whose insurance does not completely cover the cost of the ZIO monitor.  You must apply for the Patient Assistance Program to qualify for this discounted rate.  To apply, please call [employer] at [phone], select option 4, select option 2, ask to apply for  Patient Assistance Program. Meredeth Ide will ask your household income, and how many people  are in your household. They will quote your out-of-pocket cost based on that information.  [employer] will also be able to set up a 38-month, interest-free payment plan if needed.  Applying the monitor   Shave hair from upper left chest.  Hold abrader disc by orange tab. Rub abrader in 40 strokes over the upper left chest as  indicated in your monitor instructions.  Clean area with 4 enclosed alcohol pads. Let dry.  Apply patch as indicated in monitor instructions. Patch will be placed under collarbone on left  side of chest with arrow pointing upward.  Rub patch adhesive wings for 2 minutes. Remove white label marked "1". Remove the white  label marked "2". Rub patch adhesive wings for 2 additional minutes.  While looking in a mirror, press and release button in center of patch. A small green light will  flash 3-4 times. This will be your only indicator that the monitor has been turned on.  Do not shower for the first 24 hours. You may shower after the first 24 hours.  Press the button if you feel a symptom. You will hear a small click. Record Date, Time and  Symptom in the Patient Logbook.  When you are ready to remove the patch, follow instructions on the last 2 pages of Patient  Logbook. Stick patch monitor onto the last page of Patient Logbook.  Place Patient Logbook in the blue and white box. Use locking tab on box and tape box closed  securely. The blue and white box has prepaid postage on it. Please place it in the mailbox as  soon as possible. Your physician should have your test results approximately 7 days after the  monitor  has been mailed back to [facility].  Call [facility] Customer Care at [phone] if you have questions regarding  your ZIO XT patch monitor. Call them immediately if you see an orange light blinking on your  monitor.  If your monitor falls off in less than 4 days, contact our Monitor department at [phone].  If your monitor becomes loose or falls off after 4 days call [employer] at [phone] for  suggestions on securing your monitor  Heart-Healthy Eating Plan Eating a healthy diet is important for the health of your heart. A heart-healthy eating plan includes: Eating less unhealthy fats. Eating more healthy fats. Eating less salt in your food. Salt is also called sodium. Making other changes in your diet. Talk with your doctor or a diet specialist (dietitian) to create an eating plan that is right for you. What is my plan? Your doctor may recommend an eating plan that includes: Total fat: ______% or less of total calories a day. Saturated fat: ______% or less of total calories a day. Cholesterol: less than _________mg a day.  Sodium: less than _________mg a day. What are tips for following this plan? Cooking Avoid frying your food. Try to bake, boil, grill, or broil it instead. You can also reduce fat by: Removing the skin from poultry. Removing all visible fats from meats. Steaming vegetables in water or broth. Meal planning  At meals, divide your plate into four equal parts: Fill one-half of your plate with vegetables and green salads. Fill one-fourth of your plate with whole grains. Fill one-fourth of your plate with lean protein foods. Eat 2-4 cups of vegetables per day. One cup of vegetables is: 1 cup (91 g) broccoli or cauliflower florets. 2 medium carrots. 1 large bell pepper. 1 large sweet potato. 1 large tomato. 1 medium white potato. 2 cups (150 g) raw leafy greens. Eat 1-2 cups of fruit per day. One cup of fruit is: 1 small apple 1 large  banana 1 cup (237 g) mixed fruit, 1 large orange,  cup (82 g) dried fruit, 1 cup (240 mL) 100% fruit juice. Eat more foods that have soluble fiber. These are apples, broccoli, carrots, beans, peas, and barley. Try to get 20-30 g of fiber per day. Eat 4-5 servings of nuts, legumes, and seeds per week: 1 serving of dried beans or legumes equals  cup (90 g) cooked. 1 serving of nuts is  oz (12 almonds, 24 pistachios, or 7 walnut halves). 1 serving of seeds equals  oz (8 g). General information Eat more home-cooked food. Eat less restaurant, buffet, and fast food. Limit or avoid alcohol. Limit foods that are high in starch and sugar. Avoid fried foods. Lose weight if you are overweight. Keep track of how much salt (sodium) you eat. This is important if you have high blood pressure. Ask your doctor to tell you more about this. Try to add vegetarian meals each week. Fats Choose healthy fats. These include olive oil and canola oil, flaxseeds, walnuts, almonds, and seeds. Eat more omega-3 fats. These include salmon, mackerel, sardines, tuna, flaxseed oil, and ground flaxseeds. Try to eat fish at least 2 times each week. Check food labels. Avoid foods with trans fats or high amounts of saturated fat. Limit saturated fats. These are often found in animal products, such as meats, butter, and cream. These are also found in plant foods, such as palm oil, palm kernel oil, and coconut oil. Avoid foods with partially hydrogenated oils in them. These have trans fats. Examples are stick margarine, some tub margarines, cookies, crackers, and other baked goods. What foods should I eat? Fruits All fresh, canned (in natural juice), or frozen fruits. Vegetables Fresh or frozen vegetables (raw, steamed, roasted, or grilled). Green salads. Grains Most grains. Choose whole wheat and whole grains most of the time. Rice and pasta, including brown rice and pastas made with whole wheat. Meats and other  proteins Lean, well-trimmed beef, veal, pork, and lamb. Chicken and [place] without skin. All fish and shellfish. Wild duck, rabbit, pheasant, and venison. Egg whites or low-cholesterol egg substitutes. Dried beans, peas, lentils, and tofu. Seeds and most nuts. Dairy Low-fat or nonfat cheeses, including ricotta and mozzarella. Skim or 1% milk that is liquid, powdered, or evaporated. Buttermilk that is made with low-fat milk. Nonfat or low-fat yogurt. Fats and oils Non-hydrogenated (trans-free) margarines. Vegetable oils, including soybean, sesame, sunflower, olive, peanut, safflower, corn, canola, and cottonseed. Salad dressings or mayonnaise made with a vegetable oil. Beverages Mineral water. Coffee and tea. Diet carbonated beverages. Sweets and desserts Sherbet, gelatin, and fruit ice. Small  amounts of dark chocolate. Limit all sweets and desserts. Seasonings and condiments All seasonings and condiments. The items listed above may not be a complete list of foods and drinks you can eat. Contact a dietitian for more options. What foods should I avoid? Fruits Canned fruit in heavy syrup. Fruit in cream or butter sauce. Fried fruit. Limit coconut. Vegetables Vegetables cooked in cheese, cream, or butter sauce. Fried vegetables. Grains Breads that are made with saturated or trans fats, oils, or whole milk. Croissants. Sweet rolls. Donuts. High-fat crackers, such as cheese crackers. Meats and other proteins Fatty meats, such as hot dogs, ribs, sausage, bacon, rib-eye roast or steak. High-fat deli meats, such as salami and bologna. Caviar. Domestic duck and goose. Organ meats, such as liver. Dairy Cream, sour cream, cream cheese, and creamed cottage cheese. Whole-milk cheeses. Whole or 2% milk that is liquid, evaporated, or condensed. Whole buttermilk. Cream sauce or high-fat cheese sauce. Yogurt that is made from whole milk. Fats and oils Meat fat, or shortening. Cocoa butter, hydrogenated oils,  palm oil, coconut oil, palm kernel oil. Solid fats and shortenings, including bacon fat, salt pork, lard, and butter. Nondairy cream substitutes. Salad dressings with cheese or sour cream. Beverages Regular sodas and juice drinks with added sugar. Sweets and desserts Frosting. Pudding. Cookies. Cakes. Pies. Milk chocolate or white chocolate. Buttered syrups. Full-fat ice cream or ice cream drinks. The items listed above may not be a complete list of foods and drinks to avoid. Contact a dietitian for more information. Summary Heart-healthy meal planning includes eating less unhealthy fats, eating more healthy fats, and making other changes in your diet. Eat a balanced diet. This includes fruits and vegetables, low-fat or nonfat dairy, lean protein, nuts and legumes, whole grains, and heart-healthy oils and fats. This information is not intended to replace advice given to you by your health care provider. Make sure you discuss any questions you have with your health care provider. Document Revised: 11/28/2021 Document Reviewed: 11/28/2021 Elsevier Patient Education  2023 [employer].  Important Information About Sugar

## 2022-11-02 NOTE — Progress Notes (Unsigned)
Enrolled for [employer] to mail a ZIO XT long term holter monitor to the patients address on file.  ? ?Dr. Pemberton to read. ?

## 2022-11-03 LAB — TSH: TSH: 2.16 u[IU]/mL (ref 0.450–4.500)

## 2022-11-03 LAB — T4, FREE: Free T4: 1.14 ng/dL (ref 0.82–1.77)

## 2022-11-08 DIAGNOSIS — R002 Palpitations: Secondary | ICD-10-CM | POA: Diagnosis not present

## 2022-11-28 ENCOUNTER — Telehealth: Payer: Self-pay

## 2022-11-28 NOTE — Telephone Encounter (Signed)
Spoke with the patient, detailed instructions given. She stated that she would be here for her test. Asked to call back with any questions. S.Chris Cripps EMTP 

## 2022-12-04 ENCOUNTER — Ambulatory Visit (HOSPITAL_COMMUNITY): Payer: 59 | Attending: Cardiovascular Disease

## 2022-12-04 ENCOUNTER — Ambulatory Visit (HOSPITAL_BASED_OUTPATIENT_CLINIC_OR_DEPARTMENT_OTHER): Payer: 59

## 2022-12-04 DIAGNOSIS — R0602 Shortness of breath: Secondary | ICD-10-CM

## 2022-12-04 DIAGNOSIS — R0789 Other chest pain: Secondary | ICD-10-CM | POA: Insufficient documentation

## 2022-12-04 LAB — MYOCARDIAL PERFUSION IMAGING
LV dias vol: 42 mL (ref 46–106)
LV sys vol: 11 mL
Nuc Stress EF: 73 %
Peak HR: 99 {beats}/min
Rest HR: 74 {beats}/min
Rest Nuclear Isotope Dose: 9.9 mCi
SDS: 0
SRS: 0
SSS: 0
ST Depression (mm): 0 mm
Stress Nuclear Isotope Dose: 31.4 mCi
TID: 0.88

## 2022-12-04 LAB — ECHOCARDIOGRAM COMPLETE
Area-P 1/2: 3.91 cm2
Height: 61 in
S' Lateral: 1.9 cm
Weight: 3168 oz

## 2022-12-04 MED ORDER — TECHNETIUM TC 99M TETROFOSMIN IV KIT
31.4000 | PACK | Freq: Once | INTRAVENOUS | Status: AC | PRN
  Administered 2022-12-04: 31.4 via INTRAVENOUS

## 2022-12-04 MED ORDER — REGADENOSON 0.4 MG/5ML IV SOLN
0.4000 mg | Freq: Once | INTRAVENOUS | Status: AC
  Administered 2022-12-04: 0.4 mg via INTRAVENOUS

## 2022-12-04 MED ORDER — TECHNETIUM TC 99M TETROFOSMIN IV KIT
9.9000 | PACK | Freq: Once | INTRAVENOUS | Status: AC | PRN
  Administered 2022-12-04: 9.9 via INTRAVENOUS

## 2022-12-12 NOTE — Progress Notes (Unsigned)
Office Visit    Patient Name: Haley [name] Date of Encounter: 12/12/2022  Primary Care Provider:  Mack Hook, MD Primary Cardiologist:  Freada Bergeron, MD Primary Electrophysiologist: None  Chief Complaint    Haley [name] is a 67 y.o. female with PMH of nonobstructive CAD s/p LHC 2017, HTN, HLD, DM type II who presents today for 1 month follow-up of palpitations and chest pain.  Past Medical History    Past Medical History:  Diagnosis Date   Back pain    Coronary artery disease    Depression    DM type 2, controlled, with complication ([place]) 15/17/6160   Type II DM diagnosed in ~ 2006 per pt hx.    Environmental and seasonal allergies    Ganglion cyst of wrist, right 05/09/2017   Hyperlipidemia    Hypertension    Itching    Refusal of blood transfusions as patient is Jehovah's Witness    Shingles    Stroke ([place]) 2000   per pt. report:   TIA   Past Surgical History:  Procedure Laterality Date   ABDOMINAL HYSTERECTOMY  1994   Laparoscopic; ovaries still intact.  Performed for prolapsed uterus.   AREOLA/NIPPLE RECONSTRUCTION WITH GRAFT Bilateral 09/19/2021   Procedure: FREE NIPPLE GRAFT;  Surgeon: Cindra Presume, MD;  Location: [facility];  Service: Plastics;  Laterality: Bilateral;   BREAST REDUCTION SURGERY Bilateral 09/19/2021   Procedure: Bilateral Breast Reduction;  Surgeon: Cindra Presume, MD;  Location: [facility];  Service: Plastics;  Laterality: Bilateral;  2 hours   CARDIAC CATHETERIZATION N/A 10/23/2016   Procedure: Left Heart Cath and Coronary Angiography;  Surgeon: Burnell Blanks, MD;  Location: [facility];  Service: Cardiovascular;  Laterality: N/A;   CHOLECYSTECTOMY  1997   Laparoscopic   COLONOSCOPY  2005-at age 10   Hyperplastic polyp--[place]   LEFT HEART CATH AND CORONARY ANGIOGRAPHY N/A 05/04/2020   Procedure: LEFT HEART CATH AND CORONARY ANGIOGRAPHY;  Surgeon: Martinique, Peter M, MD;  Location: [facility];   Service: Cardiovascular;  Laterality: N/A;   REDUCTION MAMMAPLASTY Bilateral 09/19/2021   WISDOM TOOTH EXTRACTION      Allergies  Allergies  Allergen Reactions   Praluent [Alirocumab]     Had significant fatigue and memory loss--was lost driving in town.  Joint pain and nausea as well.   Zetia [Ezetimibe] Other (See Comments)    Joint pain and ear ringing   Atorvastatin Other (See Comments)    Pt states causes bilateral lower extremity muscle cramps   Pravastatin Nausea And Vomiting and Other (See Comments)    Causes dizziness    History of Present Illness    Haley [name]  is a 67 year old female with the above mention past medical history who presents today for ED follow-up of chest pain and palpitations.  Haley [name] was initially seen by Dr. Meda Coffee in 2017 for complaint of palpitations and exertional dyspnea.  She had 48-hour monitor that showed PVCs.  She had 2D echo completed that showed biventricular function with no significant valvular abnormalities and mild LVH.  She also underwent Lexiscan stress test that revealed no prior scar or ischemia.  She underwent cardiac CTA that showed calcium score 239 with moderate diffuse CAD.  She was seen on 10/04/2016 with complaint of shortness of breath but no chest pain.  She had LHC performed 10/2016 that revealed 3% stenosis in the right PDA with medical management recommended.  She was  seen 10/16/2018 with complaint of shortness of breath and chest discomfort that required nitroglycerin.  She had exercise Myoview completed that showed normal LV function with no arrhythmia or ischemia.  She was lost to follow-up until 6/thousand 21 when she presented with plaint of dyspnea and chest pain.  She was sent for repeat LHC that revealed no changes from previous cath and 2017.  She was last seen by Dr. Johney Frame in 01/2022 for follow-up.  During visit patient was doing well with no complaint of palpitations or chest pain.  She reported recently joining  a gym and was requesting to be taken off of beta-blocker.  She had Imdur stopped due to headache but continued on Ranexa 500 mg twice daily.  She presented to the ED on 10/29/2022 with complaint of palpitations and dyspnea.  EKG was completed that showed sinus rhythm serial troponins were normal.  She had complete resolution of symptoms and patient was discharged home.  She was seen in follow-up 11/02/2022 and reported no further chest pain episodes.  She did note occasional episodes of palpitations and her blood pressure was slightly elevated at 142/82.  Lexiscan Myoview was ordered to rule out any possible ischemic changes with results showing no evidence of ischemia and normal study.  2D echo was also repeated and showed normal EF at 55-60% with no RWMA and no LVH with mild aortic calcification but no evidence of stenosis or regurgitation present.    Haley [name] presents today for 2 month follow-up alone.  Since last being seen in the office patient reports she has been doing well with no recurrence of palpitations or chest discomfort.  Her blood pressure today's was 130/88.  She reports full compliance with her current medication regimen and denies any adverse reactions.  She reports that she is currently not drinking any soda or glass of wine and mixed drinks on certain occasions.  She reports that she is not drinking enough water daily and drinks around 16 ounces daily.  Advised her to increase her water intake to at least 64 ounces daily.  During our visit we reviewed the results of her event monitor, echo, and stress test.  She had no further questions and all other questions were answered to her full satisfaction.  She was very thankful to find out that her heart was functioning well.  patient denies chest pain, palpitations, dyspnea, PND, orthopnea, nausea, vomiting, dizziness, syncope, edema, weight gain, or early satiety.  Home Medications    Current Outpatient Medications  Medication Sig Dispense  Refill   amLODipine (NORVASC) 5 MG tablet 1 tab by mouth daily. 90 tablet 3   aspirin 81 MG tablet Take 81 mg by mouth daily.     Cholecalciferol (DIALYVITE VITAMIN D 5000) 125 MCG (5000 UT) capsule Take 15,000 Units by mouth daily.     Cyanocobalamin (B-12 PO) Take 1 tablet by mouth daily.     Evolocumab (REPATHA SURECLICK) 161 MG/ML SOAJ Inject 140 mg into the skin every 14 (fourteen) days. 2 mL 11   fexofenadine (ALLEGRA) 180 MG tablet 1 tab by mouth daily for allergies     GARLIC PO Take 0,960 capsules by mouth daily.     ibuprofen (ADVIL) 200 MG tablet 2-4 tabs by mouth twice daily with meals as needed 30 tablet 0   Liniments (BLUE-EMU SUPER STRENGTH EX) Apply 1 application topically daily as needed (pain).     lisinopril (ZESTRIL) 40 MG tablet Take 1 tablet by mouth once daily 90 tablet 3  methylPREDNISolone (MEDROL DOSEPAK) 4 MG TBPK tablet 6 day dose pack - take as directed 21 tablet 0   metoprolol succinate (TOPROL-XL) 50 MG 24 hr tablet Take 1 tablet (50 mg total) by mouth as needed (PALPITATIONS). Take with or immediately following a meal. 90 tablet 1   mometasone (NASONEX) 50 MCG/ACT nasal spray USE 2 SPRAY(S) IN EACH NOSTRIL ONCE DAILY 51 g 3   Multiple Vitamins-Minerals (MULTIVITAMIN WITH MINERALS) tablet Take 1 tablet by mouth daily.     nitroGLYCERIN (NITROSTAT) 0.4 MG SL tablet DISSOLVE ONE TABLET UNDER THE TONGUE EVERY 5 MINUTES AS NEEDED FOR CHEST PAIN.  DO NOT EXCEED A TOTAL OF 3 DOSES IN 15 MINUTES 25 tablet 3   omega-3 acid ethyl esters (LOVAZA) 1 g capsule Take 1 capsule (1 g total) by mouth 2 (two) times daily. 180 capsule 3   Potassium 95 MG TABS Take by mouth daily.     tirzepatide (MOUNJARO) 15 MG/0.5ML Pen Inject 15 mg into the skin once a week. 6 mL 3   vitamin C (ASCORBIC ACID) 500 MG tablet Take 1,000 mg by mouth daily.     zinc gluconate 50 MG tablet Take 50 mg by mouth daily.     No current facility-administered medications for this visit.     Review of  Systems  Please see the history of present illness.    All other systems reviewed and are otherwise negative except as noted above.  Physical Exam    Wt Readings from Last 3 Encounters:  12/04/22 198 lb (89.8 kg)  11/02/22 198 lb (89.8 kg)  08/09/22 201 lb (91.2 kg)   WU:JWJXB were no vitals filed for this visit.,There is no height or weight on file to calculate BMI.  Constitutional:      Appearance: Healthy appearance. Not in distress.  Neck:     Vascular: JVD normal.  Pulmonary:     Effort: Pulmonary effort is normal.     Breath sounds: No wheezing. No rales. Diminished in the bases Cardiovascular:     Normal rate. Regular rhythm. Normal S1. Normal S2.      Murmurs: There is no murmur.  Edema:    Peripheral edema absent.  Abdominal:     Palpations: Abdomen is soft non tender. There is no hepatomegaly.  Skin:    General: Skin is warm and dry.  Neurological:     General: No focal deficit present.     Mental Status: Alert and oriented to person, place and time.     Cranial Nerves: Cranial nerves are intact.  EKG/LABS/Other Studies Reviewed    ECG personally reviewed by me today -none completed today  Lab Results  Component Value Date   WBC 6.0 10/29/2022   HGB 14.1 10/29/2022   HCT 42.8 10/29/2022   MCV 95.7 10/29/2022   PLT 291 10/29/2022   Lab Results  Component Value Date   CREATININE 1.07 (H) 10/29/2022   BUN 13 10/29/2022   NA 135 10/29/2022   K 3.7 10/29/2022   CL 106 10/29/2022   CO2 24 10/29/2022   Lab Results  Component Value Date   ALT 24 10/11/2022   AST 17 10/11/2022   ALKPHOS 70 10/11/2022   BILITOT 0.4 10/11/2022   Lab Results  Component Value Date   CHOL 144 10/11/2022   HDL 46 10/11/2022   LDLCALC 80 10/11/2022   LDLDIRECT 134 (H) 07/22/2013   TRIG 99 10/11/2022   CHOLHDL 3.1 10/11/2022    Lab Results  Component  Value Date   HGBA1C CANCELED 08/09/2022    Assessment & Plan  All 1.  Nonobstructive CAD: -s/p LHC in 2017 and  2021 with nonobstructive CAD noted.  She is currently not on statin due to myalgias and has also failed PCSK9 and Zetia.  Lexiscan Myoview recently completed showing no evidence of ischemia. -Today patient reports she has not had any recurrence of chest pain since her previous visit. -Continue current GDMT with ASA 81 mg, Toprol-XL 50 mg, Ranexa 500 mg twice daily   2.  Palpitations: -ZIO monitor completed for palpitations that revealed predominant sinus rhythm with 1 episode of SVT and no evidence of atrial fibrillation or pauses. -Continue Toprol XL 50 mg daily -Today patient reports no recurrence of palpitations since her previous visit.   3.  Essential hypertension: -Patient's blood pressure today was 130/88 -Continue Toprol-XL 50 mg, Norvasc 5 mg, lisinopril 40 mg daily  -Continue low-sodium heart healthy diet  4.  Hyperlipidemia: -Patient's most recent LDL is 46 -She was recently referred to lipid clinic for consideration of inclisiran however she currently prefers to continue Repatha with no changes. -We will have her draw lipids and LFTs prior to her follow-up in 6 months   Disposition: Follow-up with Haley Sprague, MD or APP in 2 months  Disposition: Follow-up with Haley Sprague, MD or APP in 6 months   Medication Adjustments/Labs and Tests Ordered: Current medicines are reviewed at length with the patient today.  Concerns regarding medicines are outlined above.   Signed, Napoleon Form, Leodis Rains, NP 12/12/2022, 11:35 AM [facility]  Note:  This document was prepared using Dragon voice recognition software and may include unintentional dictation errors.

## 2022-12-13 ENCOUNTER — Encounter: Payer: Self-pay | Admitting: Nurse Practitioner

## 2022-12-13 ENCOUNTER — Ambulatory Visit: Payer: 59 | Attending: Nurse Practitioner | Admitting: Nurse Practitioner

## 2022-12-13 VITALS — BP 130/88 | HR 88 | Ht 61.0 in | Wt 193.0 lb

## 2022-12-13 DIAGNOSIS — E782 Mixed hyperlipidemia: Secondary | ICD-10-CM

## 2022-12-13 DIAGNOSIS — I2510 Atherosclerotic heart disease of native coronary artery without angina pectoris: Secondary | ICD-10-CM

## 2022-12-13 DIAGNOSIS — I10 Essential (primary) hypertension: Secondary | ICD-10-CM

## 2022-12-13 DIAGNOSIS — R002 Palpitations: Secondary | ICD-10-CM | POA: Diagnosis not present

## 2022-12-13 NOTE — Patient Instructions (Addendum)
Medication Instructions:  Your physician recommends that you continue on your current medications as directed. Please refer to the Current Medication list given to you today. *If you need a refill on your cardiac medications before your next appointment, please call your pharmacy*   Lab Work: AUGUST LIPIDS & LFTs If you have labs (blood work) drawn today and your tests are completely normal, you will receive your results only by: [place] (if you have MyChart) OR A paper copy in the mail If you have any lab test that is abnormal or we need to change your treatment, we will call you to review the results.   Testing/Procedures: None ordered   Follow-Up: At [facility], you and your health needs are our priority.  As part of our continuing mission to provide you with exceptional heart care, we have created designated Provider Care Teams.  These Care Teams include your primary Cardiologist (physician) and Advanced Practice Providers (APPs -  Physician Assistants and Nurse Practitioners) who all work together to provide you with the care you need, when you need it.  We recommend signing up for the patient portal called "MyChart".  Sign up information is provided on this After Visit Summary.  MyChart is used to connect with patients for Virtual Visits (Telemedicine).  Patients are able to view lab/test results, encounter notes, upcoming appointments, etc.  Non-urgent messages can be sent to your provider as well.   To learn more about what you can do with MyChart, go to [email or web address].    Your next appointment:   6 month(s)  Provider:   Freada Bergeron, MD   or Ambrose Pancoast, NP  Other [facility] [phone]

## 2022-12-23 ENCOUNTER — Telehealth: Payer: Self-pay | Admitting: *Deleted

## 2022-12-23 NOTE — Telephone Encounter (Signed)
Received on (12/22/2022) via of fax DME Standard Written Order from [facility] requesting signature and return.  Given to provider to sign.    DME Standard Written Order signed and faxed back to [facility].  Confirmation received and copy scanned into the chart.//AB/CMA

## 2023-01-15 ENCOUNTER — Other Ambulatory Visit: Payer: Self-pay | Admitting: Internal Medicine

## 2023-01-15 ENCOUNTER — Other Ambulatory Visit: Payer: Self-pay | Admitting: *Deleted

## 2023-01-15 MED ORDER — MOUNJARO 15 MG/0.5ML ~~LOC~~ SOAJ: 15.0000 mg | SUBCUTANEOUS | 0 refills | Status: DC

## 2023-01-17 LAB — LAB REPORT - SCANNED
Creatinine, POC: 121.1 mg/dL
EGFR: 54

## 2023-01-25 ENCOUNTER — Ambulatory Visit: Payer: Medicare Other | Admitting: Cardiology

## 2023-01-29 ENCOUNTER — Telehealth: Payer: Self-pay | Admitting: Cardiology

## 2023-01-29 MED ORDER — TIRZEPATIDE 15 MG/0.5ML ~~LOC~~ SOAJ: 15.0000 mg | SUBCUTANEOUS | 11 refills | Status: DC

## 2023-01-29 MED ORDER — MOUNJARO 12.5 MG/0.5ML ~~LOC~~ SOAJ: 12.5000 mg | SUBCUTANEOUS | 0 refills | Status: DC

## 2023-01-29 NOTE — Telephone Encounter (Signed)
Will forward this message to our PharmD team for further assistance.

## 2023-01-29 NOTE — Addendum Note (Signed)
Addended by: Donn Wilmot E on: 01/29/2023 11:59 AM   Modules accepted: Orders

## 2023-01-29 NOTE — Telephone Encounter (Signed)
Pt returned call, states [facility] only has Mounjaro 5mg  in stock. [facility] has had major issues with Mounjaro backorders too. Will try [facility]. Rx sent in for her 15mg  maintenance dose which she prefers, pt advised to let me know if any issues.

## 2023-01-29 NOTE — Telephone Encounter (Signed)
Pharmacies have had issues with backorders of various doses of all GLPs, can't see from our side which pharmacy  has which GLP at which strength at any given time. We do not have GLP samples either. Unclear why her glucose has been low if she's been off her Mounjaro.   Called to discuss, can decrease dose pending availability at pharmacy. Pt states on Mounjaro her sugar was never low or high. Has been dropping lately, was 60 this AM. She doesn't take anything else for her blood sugar, thinks she's been feeling poorly since she's been off the Mounjaro. Will send in rx for 12.5mg  dose to see if pharmacy can get this in stock instead. She will let me know if not - can try different dose or different pharmacy if needed. Discussed that we don't want her off the med for a prolonged period of time as we would then have to start her back at the 2.5mg  dose and it would take 6 months to titrate her back up to the maintenance dose she had been taking. She'll call back with an update on availability at her pharmacy.

## 2023-01-29 NOTE — Telephone Encounter (Signed)
  Pt c/o medication issue:  1. Name of Medication: tirzepatide (MOUNJARO) 15 MG/0.5ML Pen   2. How are you currently taking this medication (dosage and times per day)? Inject 15 mg into the skin once a week.   3. Are you having a reaction (difficulty breathing--STAT)? No   4. What is your medication issue? Pt said, her pharmacy told her there's a shortage for this medication and she's been not taking this for 2 weeks and she is starting to feel unwell and her blood sugar constantly low. She checking if Dr. Johney Frame will changed the medication or can give her samples

## 2023-01-30 ENCOUNTER — Other Ambulatory Visit (HOSPITAL_BASED_OUTPATIENT_CLINIC_OR_DEPARTMENT_OTHER): Payer: Self-pay

## 2023-01-30 MED ORDER — MOUNJARO 2.5 MG/0.5ML ~~LOC~~ SOAJ
2.5000 mg | SUBCUTANEOUS | 0 refills | Status: DC
  Filled 2023-01-30: qty 2, 28d supply, fill #0

## 2023-01-30 NOTE — Telephone Encounter (Signed)
Pt called stating she can't find a pharmacy with her Mounjaro. She states that she has been out for almost 2 months. At this point she needs to re-start titration. I explained this to patient. Rx for [facility] 2.5mg  was sent to [facility] since I could confirm that they had it. I asked her to call us after her third injection so we can call in next dose.

## 2023-01-30 NOTE — Addendum Note (Signed)
Addended by: Marcelle Overlie D on: 01/30/2023 11:55 AM   Modules accepted: Orders

## 2023-02-02 ENCOUNTER — Encounter: Payer: Self-pay | Admitting: Nephrology

## 2023-02-14 ENCOUNTER — Telehealth: Payer: Self-pay | Admitting: Pharmacist

## 2023-02-14 MED ORDER — MOUNJARO 5 MG/0.5ML ~~LOC~~ SOAJ: 5.0000 mg | SUBCUTANEOUS | 0 refills | Status: DC

## 2023-02-14 NOTE — Telephone Encounter (Signed)
Pt called clinic, has 1 dose of Mounjaro 2.5mg  left, tolerating well. Rx sent in for 5mg  dose to continue her titration. Previously on 15mg  dose but then went a few months without it so had to start back over at starting dose again.

## 2023-02-17 DIAGNOSIS — M79672 Pain in left foot: Secondary | ICD-10-CM | POA: Insufficient documentation

## 2023-02-19 ENCOUNTER — Other Ambulatory Visit (HOSPITAL_BASED_OUTPATIENT_CLINIC_OR_DEPARTMENT_OTHER): Payer: Self-pay

## 2023-02-19 MED ORDER — MOUNJARO 5 MG/0.5ML ~~LOC~~ SOAJ
5.0000 mg | SUBCUTANEOUS | 0 refills | Status: DC
  Filled 2023-02-19: qty 2, 28d supply, fill #0

## 2023-02-19 NOTE — Telephone Encounter (Signed)
Pt called clinic, states her pharmacy can't get Mounjaro in stock still. Will try sending in rx to [facility] pharmacy.

## 2023-02-19 NOTE — Addendum Note (Signed)
Addended by: Latavious Bitter E on: 02/19/2023 12:13 PM   Modules accepted: Orders

## 2023-03-14 ENCOUNTER — Other Ambulatory Visit (HOSPITAL_BASED_OUTPATIENT_CLINIC_OR_DEPARTMENT_OTHER): Payer: Self-pay

## 2023-03-14 ENCOUNTER — Encounter (HOSPITAL_BASED_OUTPATIENT_CLINIC_OR_DEPARTMENT_OTHER): Payer: Self-pay

## 2023-03-14 ENCOUNTER — Telehealth: Payer: Self-pay | Admitting: Pharmacist

## 2023-03-14 MED ORDER — MOUNJARO 7.5 MG/0.5ML ~~LOC~~ SOAJ: 7.5000 mg | SUBCUTANEOUS | 0 refills | Status: DC

## 2023-03-14 MED ORDER — MOUNJARO 7.5 MG/0.5ML ~~LOC~~ SOAJ
7.5000 mg | SUBCUTANEOUS | 0 refills | Status: DC
  Filled 2023-03-14: qty 2, 28d supply, fill #0

## 2023-03-14 NOTE — Telephone Encounter (Signed)
Pt called back, states [facility] can't get Mounjaro in stock. Rx sent to [facility] pharmacy per pt request.

## 2023-03-14 NOTE — Addendum Note (Signed)
Addended by: Valiant Dills E on: 03/14/2023 12:41 PM   Modules accepted: Orders

## 2023-03-14 NOTE — Telephone Encounter (Signed)
Pt left message stating she used her 3rd Mounjaro rx and wants refill sent to [facility] on [address].  Called pt to see if she wishes to increase her dose for next month, she does. 7.5mg  dose sent to pharmacy of choice.

## 2023-03-16 ENCOUNTER — Other Ambulatory Visit (HOSPITAL_COMMUNITY): Payer: Self-pay

## 2023-03-16 ENCOUNTER — Telehealth: Payer: Self-pay

## 2023-03-16 NOTE — Telephone Encounter (Signed)
Pharmacy Patient Advocate Encounter   Received notification from CMM that prior authorization for REPATHA 140MG/ML is required/requested.   PER TEST CLAIM:      

## 2023-03-19 MED ORDER — MOUNJARO 15 MG/0.5ML ~~LOC~~ SOAJ: 15.0000 mg | SUBCUTANEOUS | 11 refills | Status: DC

## 2023-03-19 NOTE — Telephone Encounter (Signed)
Pt left message that [facility] pharmacy has the [facility] 15mg  dose. She previously took that dose and tolerated it fine. She has been taking the 5mg  dose most recently. States pharmacy doesn't have any other dose and hasn't been able to find med anywhere else, have tried sending in rx to multiple pharmacies. She is aware this would be a big dose increase from her current dose. Reassuring that she previously tolerated this dose well and has not had side effects with prior dose increases. She is aware risk of GI side effects is higher with this dose increase, she wishes to try it. Rx sent in. Advised her to stay around the house the next few days in case of GI issues, she states she doesn't have anything going on and will do so.

## 2023-03-19 NOTE — Addendum Note (Signed)
Addended by: Madailein Londo E on: 03/19/2023 04:47 PM   Modules accepted: Orders

## 2023-03-20 ENCOUNTER — Other Ambulatory Visit (HOSPITAL_BASED_OUTPATIENT_CLINIC_OR_DEPARTMENT_OTHER): Payer: Self-pay

## 2023-04-17 ENCOUNTER — Encounter: Payer: Self-pay | Admitting: Internal Medicine

## 2023-04-17 ENCOUNTER — Ambulatory Visit (INDEPENDENT_AMBULATORY_CARE_PROVIDER_SITE_OTHER): Payer: 59 | Admitting: Internal Medicine

## 2023-04-17 VITALS — BP 146/100 | HR 80 | Resp 16 | Ht 61.5 in | Wt 184.5 lb

## 2023-04-17 DIAGNOSIS — R102 Pelvic and perineal pain unspecified side: Secondary | ICD-10-CM

## 2023-04-17 DIAGNOSIS — M25561 Pain in right knee: Secondary | ICD-10-CM | POA: Diagnosis not present

## 2023-04-17 DIAGNOSIS — E7800 Pure hypercholesterolemia, unspecified: Secondary | ICD-10-CM

## 2023-04-17 DIAGNOSIS — Z79899 Other long term (current) drug therapy: Secondary | ICD-10-CM

## 2023-04-17 DIAGNOSIS — R6889 Other general symptoms and signs: Secondary | ICD-10-CM

## 2023-04-17 DIAGNOSIS — Z0000 Encounter for general adult medical examination without abnormal findings: Secondary | ICD-10-CM | POA: Diagnosis not present

## 2023-04-17 DIAGNOSIS — I10 Essential (primary) hypertension: Secondary | ICD-10-CM

## 2023-04-17 DIAGNOSIS — M62838 Other muscle spasm: Secondary | ICD-10-CM

## 2023-04-17 DIAGNOSIS — E118 Type 2 diabetes mellitus with unspecified complications: Secondary | ICD-10-CM

## 2023-04-17 DIAGNOSIS — G8929 Other chronic pain: Secondary | ICD-10-CM

## 2023-04-17 MED ORDER — CALCIUM CITRATE 250 MG PO TABS: ORAL_TABLET | ORAL | 0 refills | Status: AC

## 2023-04-17 NOTE — Progress Notes (Unsigned)
Subjective:    Patient ID: Haley [name], female   DOB: 12-Jun-1956, 67 y.o.   MRN: [id number]   HPI  CPE without pap  1.  Pap:  all normal in past.  2.  Mammogram:  Last mammogram 07/05/2022 and normal .  No family history of breast cancer.    3.  Osteoprevention:  Not taking calcium, just vitamin D and not consistent with that.  She is walking, but not as she has in past.  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    4.  Guaiac Cards/FIT:  Last performed 03/2022 and negative.    5.  Colonoscopy:  Last colonoscopy 2021 with just diverticulosis.  Next due in 2031.   No family history of colon cancer.  6.  Immunizations: Has not had the 2023-2024 COVID vaccine.   Immunization History  Administered Date(s) Administered   Influenza Inj Mdck Quad Pf 07/24/2022   Influenza, High Dose Seasonal PF 07/24/2019   Influenza, Quadrivalent, Recombinant, Inj, Pf 08/01/2016   Influenza,inj,Quad PF,6+ Mos 12/28/2015, 07/27/2017, 08/04/2018   Influenza-Unspecified 07/10/2017, 07/24/2019, 08/04/2020, 08/08/2021   Moderna Covid-19 Vaccine Bivalent Booster 33yrs & up 02/24/2022   Moderna Sars-Covid-2 Vaccination 06/23/2020, 07/21/2020, 01/19/2021   Pfizer Covid-19 Vaccine Bivalent Booster 46yrs & up 07/28/2021   Pneumococcal Conjugate-13 09/19/2019   Pneumococcal Polysaccharide-23 08/13/1996, 07/22/2013, 09/21/2020   Td 11/12/1996   Tdap 04/18/2016   Zoster Recombinat (Shingrix) 01/04/2021, 04/06/2021     7.  Glucose/Cholesterol:  Last A1C was 6.2% in April of 2023.  Last LDL was 80 end of December.  She was to add Rosuvastatin back 3 times daily, but did not and states she had severe leg cramps with the Rosuvastatin.  She does not want to restart.   She elected to stay with Repatha and not move to Inclisiran in February when seen with cardiology. Lipid Panel     Component Value Date/Time   CHOL 144 10/11/2022 0850   TRIG 99 10/11/2022 0850   HDL 46  10/11/2022 0850   CHOLHDL 3.1 10/11/2022 0850   LDLCALC 80 10/11/2022 0850   LDLDIRECT 134 (H) 07/22/2013 1620   LABVLDL 18 10/11/2022 0850     Current Meds  Medication Sig   amLODipine (NORVASC) 5 MG tablet 1 tab by mouth daily.   aspirin 81 MG tablet Take 81 mg by mouth daily.   cetirizine (ZYRTEC) 10 MG tablet Take 10 mg by mouth daily.   Cholecalciferol (DIALYVITE VITAMIN D 5000) 125 MCG (5000 UT) capsule Take 15,000 Units by mouth daily.   Cyanocobalamin (B-12 PO) Take 1 tablet by mouth daily.   Evolocumab (REPATHA SURECLICK) 140 MG/ML SOAJ Inject 140 mg into the skin every 14 (fourteen) days.   ibuprofen (ADVIL) 200 MG tablet 2-4 tabs by mouth twice daily with meals as needed   Liniments (BLUE-EMU SUPER STRENGTH EX) Apply 1 application topically daily as needed (pain).   lisinopril (ZESTRIL) 40 MG tablet Take 1 tablet by mouth once daily   metoprolol succinate (TOPROL-XL) 50 MG 24 hr tablet Take 1 tablet (50 mg total) by mouth as needed (PALPITATIONS). Take with or immediately following a meal.   mometasone (NASONEX) 50 MCG/ACT nasal spray USE 2 SPRAY(S) IN EACH NOSTRIL ONCE DAILY   Multiple Vitamins-Minerals (MULTIVITAMIN WITH MINERALS) tablet Take 1 tablet by mouth daily.   nitroGLYCERIN (NITROSTAT) 0.4 MG SL tablet DISSOLVE ONE TABLET UNDER THE TONGUE EVERY 5 MINUTES AS NEEDED FOR CHEST PAIN.  DO NOT  EXCEED A TOTAL OF 3 DOSES IN 15 MINUTES   omega-3 acid ethyl esters (LOVAZA) 1 g capsule Take 1 capsule (1 g total) by mouth 2 (two) times daily.   tirzepatide (MOUNJARO) 15 MG/0.5ML Pen Inject 15 mg into the skin once a week.   vitamin C (ASCORBIC ACID) 500 MG tablet Take 1,000 mg by mouth daily.   Allergies  Allergen Reactions   Praluent [Alirocumab]     Had significant fatigue and memory loss--was lost driving in town.  Joint pain and nausea as well.   Zetia [Ezetimibe] Other (See Comments)    Joint pain and ear ringing   Atorvastatin Other (See Comments)    Pt states causes  bilateral lower extremity muscle cramps   Pravastatin Nausea And Vomiting and Other (See Comments)    Causes dizziness   Past Medical History:  Diagnosis Date   Back pain    Coronary artery disease    Depression    DM type 2, controlled, with complication (HCC) 07/22/2013   Type II DM diagnosed in ~ 2006 per pt hx.    Environmental and seasonal allergies    Ganglion cyst of wrist, right 05/09/2017   Hyperlipidemia    Hypertension    Itching    Refusal of blood transfusions as patient is Jehovah's Witness    Shingles    Stroke (HCC) 2000   per pt. report:   TIA   Past Surgical History:  Procedure Laterality Date   ABDOMINAL HYSTERECTOMY  1994   Laparoscopic; ovaries still intact.  Performed for prolapsed uterus.   AREOLA/NIPPLE RECONSTRUCTION WITH GRAFT Bilateral 09/19/2021   Procedure: FREE NIPPLE GRAFT;  Surgeon: Allena Napoleon, MD;  Location: [facility] OR;  Service: Plastics;  Laterality: Bilateral;   BREAST REDUCTION SURGERY Bilateral 09/19/2021   Procedure: Bilateral Breast Reduction;  Surgeon: Allena Napoleon, MD;  Location: [facility] OR;  Service: Plastics;  Laterality: Bilateral;  2 hours   CARDIAC CATHETERIZATION N/A 10/23/2016   Procedure: Left Heart Cath and Coronary Angiography;  Surgeon: Kathleene Hazel, MD;  Location: [facility] INVASIVE CV LAB;  Service: Cardiovascular;  Laterality: N/A;   CHOLECYSTECTOMY  1997   Laparoscopic   COLONOSCOPY  2005-at age 25   Hyperplastic polyp--[place]   LEFT HEART CATH AND CORONARY ANGIOGRAPHY N/A 05/04/2020   Procedure: LEFT HEART CATH AND CORONARY ANGIOGRAPHY;  Surgeon: [place]az[place], Peter M, MD;  Location: [facility] INVASIVE CV LAB;  Service: Cardiovascular;  Laterality: N/A;   REDUCTION MAMMAPLASTY Bilateral 09/19/2021   WISDOM TOOTH EXTRACTION     Family History  Problem Relation Age of Onset   Diabetes Father    Stroke Maternal Aunt        Multiple aunts died from strokes-maternal   Hypertension Son    Diabetes Son    Hypertension Brother     Diabetes Brother    Diabetes Sister        prediabetic   Cancer Sister 38       uterine cancer   Seizures Brother    Social History   Socioeconomic History   Marital status: Single    Spouse name: Not on file   Number of children: 2   Years of education: 12+   Highest education level: Some college, no degree  Occupational History   Occupation: Print production planner    Comment: Retired now  Tobacco Use   Smoking status: Never    Passive exposure: Never   Smokeless tobacco: Never  Vaping Use   Vaping Use: Never  used  Substance and Sexual Activity   Alcohol use: Yes    Alcohol/week: 0.0 standard drinks of alcohol    Comment: occasionally-1 to 2 drinks monthly    Drug use: No   Sexual activity: Not Currently    Birth control/protection: Surgical  Other Topics Concern   Not on file  Social History Narrative   Did get some post high school training/education   Lives alone   [place] 2 great grandchildren frequently   Daughter lives in [place] and checks in regularly.   Social Determinants of Health   Financial Resource Strain: Low Risk  (04/18/2023)   Overall Financial Resource Strain (CARDIA)    Difficulty of Paying Living Expenses: Not hard at all  Food Insecurity: No Food Insecurity (04/18/2023)   Hunger Vital Sign    Worried About Running Out of Food in the Last Year: Never true    Ran Out of Food in the Last Year: Never true  Transportation Needs: No Transportation Needs (04/18/2023)   PRAPARE - Administrator, Civil Service (Medical): No    Lack of Transportation (Non-Medical): No  Physical Activity: Insufficiently Active (07/09/2018)   Exercise Vital Sign    Days of Exercise per Week: 3 days    Minutes of Exercise per Session: 30 min  Stress: No Stress Concern Present (07/09/2018)   [employer] of Occupational Health - Occupational Stress Questionnaire    Feeling of Stress : Only a little  Social Connections: Somewhat Isolated (07/09/2018)   Social  Connection and Isolation Panel [NHANES]    Frequency of Communication with Friends and Family: More than three times a week    Frequency of Social Gatherings with Friends and Family: More than three times a week    Attends Religious Services: 1 to 4 times per year    Active Member of [employer] or Organizations: No    Attends Banker Meetings: Never    Marital Status: Divorced  Catering manager Violence: Not At Risk (04/18/2023)   Humiliation, Afraid, Rape, and Kick questionnaire    Fear of Current or Ex-Partner: No    Emotionally Abused: No    Physically Abused: No    Sexually Abused: No         Review of Systems  HENT:  Negative for dental problem.   Eyes:  Negative for visual disturbance (Has had eye check.  May be developing glaucoma.  Dr. Dione Booze.).  Respiratory:  Negative for shortness of breath.   Cardiovascular:  Negative for chest pain, palpitations and leg swelling.       BP fine at home:  110/69 this morning--new monitor.  Musculoskeletal:  Positive for arthralgias (right SI area joint pain.  For 3-4 months.  No injury or overuse syndrome she recalls  Describes tightness and radiation down her thigh. History of SI joint OA bilaterally.Right knee hurting as well.).      Objective:   Today's Vitals   04/17/23 1359 04/17/23 1403  BP: (!) 146/90 (!) 146/100  Pulse: 80   Resp: 16   Weight: 184 lb 8 oz (83.7 kg)   Height: 5' 1.5" (1.562 m)    Body mass index is 34.3 kg/m.     Physical Exam Constitutional:      Appearance: She is obese.  HENT:     Head: Normocephalic and atraumatic.     Right Ear: Tympanic membrane, ear canal and external ear normal.     Nose: Nose normal.     Mouth/Throat:  Mouth: Mucous membranes are moist.     Pharynx: Oropharynx is clear.  Eyes:     Extraocular Movements: Extraocular movements intact.     Pupils: Pupils are equal, round, and reactive to light.     Comments: Discs sharp Small cataracts bilaterally  Neck:      Thyroid: No thyroid mass or thyromegaly.  Cardiovascular:     Rate and Rhythm: Normal rate and regular rhythm.     Pulses:          Dorsalis pedis pulses are 2+ on the right side and 2+ on the left side.       Posterior tibial pulses are 2+ on the right side and 2+ on the left side.     Heart sounds: S1 normal and S2 normal. No murmur heard.    No friction rub. No S3 or S4 sounds.     Comments: No carotid bruits.  Carotid, radial, femoral, DP and PT pulses normal and equal.   Pulmonary:     Effort: Pulmonary effort is normal.     Breath sounds: Normal breath sounds and air entry.  Chest:  Breasts:    Right: No inverted nipple, mass or nipple discharge.     Left: No inverted nipple, mass or nipple discharge.  Abdominal:     General: Bowel sounds are normal.     Palpations: Abdomen is soft. There is no hepatomegaly, splenomegaly or mass.     Tenderness: There is no abdominal tenderness.     Hernia: No hernia is present.  Genitourinary:    Comments: Normal external female genitalia No uterine or adnexal mass or tenderness Musculoskeletal:        General: Normal range of motion.     Cervical back: Normal range of motion and neck supple.     Right knee: No effusion. Tenderness present over the medial joint line.     Right lower leg: No edema.     Left lower leg: No edema.     Comments: Muscles of legs nontender Very tender over right SI joint   Feet:     Right foot:     Protective Sensation: 10 sites tested.  10 sites sensed.     Skin integrity: Skin integrity normal.     Toenail Condition: Right toenails are normal.     Left foot:     Protective Sensation: 10 sites tested.  10 sites sensed.     Skin integrity: Skin integrity normal.     Toenail Condition: Left toenails are normal.  Lymphadenopathy:     Head:     Right side of head: No submental or submandibular adenopathy.     Left side of head: No submental or submandibular adenopathy.     Cervical: No cervical  adenopathy.     Upper Body:     Right upper body: No supraclavicular or axillary adenopathy.     Left upper body: No supraclavicular or axillary adenopathy.     Lower Body: No right inguinal adenopathy. No left inguinal adenopathy.  Skin:    General: Skin is warm.     Capillary Refill: Capillary refill takes less than 2 seconds.  Neurological:     General: No focal deficit present.     Mental Status: She is alert and oriented to person, place, and time.     Cranial Nerves: Cranial nerves 2-12 are intact.     Sensory: Sensation is intact.     Motor: Motor function is intact.  Coordination: Coordination is intact.     Gait: Gait is intact.     Deep Tendon Reflexes: Reflexes are normal and symmetric.  Psychiatric:        Attention and Perception: Attention normal.        Mood and Affect: Mood normal.        Speech: Speech normal.        Behavior: Behavior normal. Behavior is cooperative.      Assessment & Plan   CPE no pap Mammogram in August Calcium Citrate 500 mg twice daily and continue D3 Encouraged this year's COVID booster at pharmacy of choice.    2.  Hypertension:  recheck in 2 weeks.  Will bring in home monitor to compare.   CBC, CMP  3.  DM:  Has been controlled.  A1C and urine microalbumin/crea.  Continue Mounjaro.  4.  Right knee OA and suspect Right SI pain as cause of posterior pelvic pain:  referral to Dr. Shelle Iron, ortho.  She has been to that office previously many years ago.  Discussed PT especially for her pelvic/low back issues.   Will not order xrays --leave that up to Dr. Shelle Iron at his facility.  5.  Muscles spasms and cold intolerance:  increase daily fluid intake.  TSH, CBC.  Has tried Super B complex without improvement in past.  6.  Hypercholesterolemia:  LDL has not been at goal with Repatha and pt did not restart Rosuvastatin as recommended in December by pharmD as had side effects.  Discussed I have no experience with Inclisiran and she should have a  discussion of arthralgias that can arise as side effect with PharmD /cardiology and if does occur, how often reversible.  FlP  DMV for bad knee

## 2023-04-18 LAB — LIPID PANEL W/O CHOL/HDL RATIO
Cholesterol, Total: 210 mg/dL — ABNORMAL HIGH (ref 100–199)
HDL: 50 mg/dL (ref >39)
LDL Chol Calc (NIH): 145 mg/dL — ABNORMAL HIGH (ref 0–99)
Triglycerides: 86 mg/dL (ref 0–149)
VLDL Cholesterol Cal: 15 mg/dL (ref 5–40)

## 2023-04-18 LAB — COMPREHENSIVE METABOLIC PANEL
ALT: 21 IU/L (ref 0–32)
AST: 21 IU/L (ref 0–40)
Albumin/Globulin Ratio: 1.5
Albumin: 4.3 g/dL (ref 3.9–4.9)
Alkaline Phosphatase: 68 IU/L (ref 44–121)
BUN/Creatinine Ratio: 11 — ABNORMAL LOW (ref 12–28)
BUN: 13 mg/dL (ref 8–27)
Bilirubin Total: 0.4 mg/dL (ref 0.0–1.2)
CO2: 24 mmol/L (ref 20–29)
Calcium: 10.2 mg/dL (ref 8.7–10.3)
Chloride: 100 mmol/L (ref 96–106)
Creatinine, Ser: 1.18 mg/dL — ABNORMAL HIGH (ref 0.57–1.00)
Globulin, Total: 2.8 g/dL (ref 1.5–4.5)
Glucose: 83 mg/dL (ref 70–99)
Potassium: 3.9 mmol/L (ref 3.5–5.2)
Sodium: 137 mmol/L (ref 134–144)
Total Protein: 7.1 g/dL (ref 6.0–8.5)
eGFR: 50 mL/min/{1.73_m2} — ABNORMAL LOW (ref >59)

## 2023-04-18 LAB — CBC WITH DIFFERENTIAL/PLATELET
Basophils Absolute: 0.1 10*3/uL (ref 0.0–0.2)
Basos: 1 %
EOS (ABSOLUTE): 0.1 10*3/uL (ref 0.0–0.4)
Eos: 2 %
Hematocrit: 43.1 % (ref 34.0–46.6)
Hemoglobin: 14.1 g/dL (ref 11.1–15.9)
Immature Grans (Abs): 0 10*3/uL (ref 0.0–0.1)
Immature Granulocytes: 0 %
Lymphocytes Absolute: 2.8 10*3/uL (ref 0.7–3.1)
Lymphs: 49 %
MCH: 30.5 pg (ref 26.6–33.0)
MCHC: 32.7 g/dL (ref 31.5–35.7)
MCV: 93 fL (ref 79–97)
Monocytes Absolute: 0.6 10*3/uL (ref 0.1–0.9)
Monocytes: 10 %
Neutrophils Absolute: 2.2 10*3/uL (ref 1.4–7.0)
Neutrophils: 38 %
Platelets: 308 10*3/uL (ref 150–450)
RBC: 4.63 x10E6/uL (ref 3.77–5.28)
RDW: 11.9 % (ref 11.7–15.4)
WBC: 5.8 10*3/uL (ref 3.4–10.8)

## 2023-04-18 LAB — HGB A1C W/O EAG: Hgb A1c MFr Bld: 5.7 % — ABNORMAL HIGH (ref 4.8–5.6)

## 2023-04-18 LAB — MICROALBUMIN / CREATININE URINE RATIO
Creatinine, Urine: 77.9 mg/dL
Microalb/Creat Ratio: <4 mg/g creat (ref 0–29)
Microalbumin, Urine: <3 ug/mL

## 2023-04-18 LAB — TSH: TSH: 2.41 u[IU]/mL (ref 0.450–4.500)

## 2023-04-20 ENCOUNTER — Telehealth: Payer: Self-pay | Admitting: Pharmacist

## 2023-04-20 MED ORDER — NEXLETOL 180 MG PO TABS: 1.0000 | ORAL_TABLET | Freq: Every day | ORAL | 11 refills | Status: DC

## 2023-04-20 NOTE — Telephone Encounter (Signed)
PA approved through 11/06/23. Rx sent to pharmacy. Can recheck labs with MD appt in 3 months.  Left detailed message for pt and to call back with any concerns.

## 2023-04-20 NOTE — Telephone Encounter (Signed)
Received message from PCP that pt stopped taking rosuvastatin in December. Spoke with pt, she reports stopping rosuvastatin secondary to joint pain. Already intolerant to atorvastatin, pravastatin, Zetia, and Praluent. Reports missing 2 Repatha injections in the past 2 months. Encouraged her to stay adherent with her Repatha. Discussed adding on Nexletol which she is willing to try. Will submit prior auth and follow up with pt once approved. Key: [id number]

## 2023-05-01 ENCOUNTER — Ambulatory Visit (INDEPENDENT_AMBULATORY_CARE_PROVIDER_SITE_OTHER): Payer: 59

## 2023-05-01 VITALS — BP 128/78 | HR 84

## 2023-05-01 DIAGNOSIS — Z0130 Encounter for examination of blood pressure without abnormal findings: Secondary | ICD-10-CM

## 2023-05-01 NOTE — Progress Notes (Signed)
Patient reported that she has been taking bp medication consistently. 

## 2023-05-16 NOTE — Progress Notes (Deleted)
Cardiology Office Note:    Date:  05/16/2023   ID:  Haley [name], DOB 1956/05/01, MRN [id number]  PCP:  Julieanne Manson, MD   [facility] Providers Cardiologist:  Meriam Sprague, MD {   Referring MD: Julieanne Manson, MD   History of Present Illness:    Haley [name] is a 67 y.o. female with a hx of nonobstructive CAD on cath in 2017, DMII, HTN, and HLD who was previously followed by Dr. Delton See who now presents to clinic for follow-up.  Per review of the record, patient underwent coronary CTA in 2017 with calcium score of 239 which was 93 percentile, moderate diffuse CAD possible obstructive.  Cardiac catheterization 10/2016 mild nonobstructive disease in the LAD, moderate stenosis in the mid to distal PDA 50%. LV function normal. Medical therapy recommended.  Saw Dr. Delton See on 04/23/20 with SOB and chest pressure. Underwent repeat LHC on 05/04/20 which showed nonobstructive CAD similar to prior.   Was seen in the ER 10/29/22 for palpitations and dyspnea. Work-up was reassuring and she was discharged home. Follow-up myoview negative for ischemia. TTE with EF 55-60%, no WMA, no significant valve disease.  Was last seen in clinic on 12/2022 where she was doing well from a CV standpoint.  Today, ***   Past Medical History:  Diagnosis Date   Back pain    Coronary artery disease    Depression    DM type 2, controlled, with complication (HCC) 07/22/2013   Type II DM diagnosed in ~ 2006 per pt hx.    Environmental and seasonal allergies    Ganglion cyst of wrist, right 05/09/2017   Hyperlipidemia    Hypertension    Itching    Refusal of blood transfusions as patient is Jehovah's Witness    Shingles    Stroke (HCC) 2000   per pt. report:   TIA    Past Surgical History:  Procedure Laterality Date   ABDOMINAL HYSTERECTOMY  1994   Laparoscopic; ovaries still intact.  Performed for prolapsed uterus.   AREOLA/NIPPLE RECONSTRUCTION WITH GRAFT Bilateral  09/19/2021   Procedure: FREE NIPPLE GRAFT;  Surgeon: Allena Napoleon, MD;  Location: [facility] OR;  Service: Plastics;  Laterality: Bilateral;   BREAST REDUCTION SURGERY Bilateral 09/19/2021   Procedure: Bilateral Breast Reduction;  Surgeon: Allena Napoleon, MD;  Location: [facility] OR;  Service: Plastics;  Laterality: Bilateral;  2 hours   CARDIAC CATHETERIZATION N/A 10/23/2016   Procedure: Left Heart Cath and Coronary Angiography;  Surgeon: Kathleene Hazel, MD;  Location: [facility] INVASIVE CV LAB;  Service: Cardiovascular;  Laterality: N/A;   CHOLECYSTECTOMY  1997   Laparoscopic   COLONOSCOPY  2005-at age 64   Hyperplastic polyp--[place]   LEFT HEART CATH AND CORONARY ANGIOGRAPHY N/A 05/04/2020   Procedure: LEFT HEART CATH AND CORONARY ANGIOGRAPHY;  Surgeon: Swaziland, Peter M, MD;  Location: [facility] INVASIVE CV LAB;  Service: Cardiovascular;  Laterality: N/A;   REDUCTION MAMMAPLASTY Bilateral 09/19/2021   WISDOM TOOTH EXTRACTION      Current Medications: No outpatient medications have been marked as taking for the 05/17/23 encounter (Appointment) with Meriam Sprague, MD.     Allergies:   Praluent [alirocumab], Rosuvastatin, Zetia [ezetimibe], Atorvastatin, and Pravastatin   Social History   Socioeconomic History   Marital status: Single    Spouse name: Not on file   Number of children: 2   Years of education: 12+   Highest education level: Some college, no degree  Occupational History  Occupation: Print production planner    Comment: Retired now  Tobacco Use   Smoking status: Never    Passive exposure: Never   Smokeless tobacco: Never  Vaping Use   Vaping Use: Never used  Substance and Sexual Activity   Alcohol use: Yes    Alcohol/week: 0.0 standard drinks of alcohol    Comment: occasionally-1 to 2 drinks monthly    Drug use: No   Sexual activity: Not Currently    Birth control/protection: Surgical  Other Topics Concern   Not on file  Social History Narrative   Did get some post high  school training/education   Lives alone   [place] 2 great grandchildren frequently   Daughter lives in [place] and checks in regularly.   Social Determinants of Health   Financial Resource Strain: Low Risk  (04/18/2023)   Overall Financial Resource Strain (CARDIA)    Difficulty of Paying Living Expenses: Not hard at all  Food Insecurity: No Food Insecurity (04/18/2023)   Hunger Vital Sign    Worried About Running Out of Food in the Last Year: Never true    Ran Out of Food in the Last Year: Never true  Transportation Needs: No Transportation Needs (04/18/2023)   PRAPARE - Administrator, Civil Service (Medical): No    Lack of Transportation (Non-Medical): No  Physical Activity: Insufficiently Active (07/09/2018)   Exercise Vital Sign    Days of Exercise per Week: 3 days    Minutes of Exercise per Session: 30 min  Stress: No Stress Concern Present (07/09/2018)   [employer] of Occupational Health - Occupational Stress Questionnaire    Feeling of Stress : Only a little  Social Connections: Somewhat Isolated (07/09/2018)   Social Connection and Isolation Panel [NHANES]    Frequency of Communication with Friends and Family: More than three times a week    Frequency of Social Gatherings with Friends and Family: More than three times a week    Attends Religious Services: 1 to 4 times per year    Active Member of [employer] or Organizations: No    Attends Engineer, structural: Never    Marital Status: Divorced     Family History: The patient's family history includes Cancer (age of onset: 56) in her sister; Diabetes in her brother, father, sister, and son; Hypertension in her brother and son; Seizures in her brother; Stroke in her maternal aunt.  ROS:   Please see the history of present illness.    Review of Systems  Constitutional:  Negative for chills and fever.  HENT:  Negative for sore throat.   Eyes:  Negative for blurred vision.  Respiratory:  Negative for  shortness of breath.   Cardiovascular:  Positive for palpitations. Negative for chest pain, orthopnea, claudication, leg swelling and PND.  Gastrointestinal:  Negative for nausea and vomiting.  Genitourinary:  Negative for urgency.  Musculoskeletal:  Positive for joint pain and myalgias. Negative for falls.  Skin:  Negative for itching and rash.  Neurological:  Negative for dizziness and loss of consciousness.  Endo/Heme/Allergies:  Does not bruise/bleed easily.  Psychiatric/Behavioral:  Negative for substance abuse.      EKGs/Labs/Other Studies Reviewed:    The following studies were reviewed today:  2D-Echo 05/12/20 IMPRESSIONS   1. Left ventricular ejection fraction, by estimation, is 60 to 65%. The left ventricle has normal function. The left ventricle has no regional wall motion abnormalities. There is mild concentric left ventricular hypertrophy. Left ventricular diastolic parameters are indeterminate.  2. Right ventricular systolic function is normal. The right ventricular size is normal. Tricuspid regurgitation signal is inadequate for assessing PA pressure.   3. The mitral valve is normal in structure. Trivial mitral valve  regurgitation. No evidence of mitral stenosis.   4. The aortic valve is tricuspid. Aortic valve regurgitation is not visualized. No aortic stenosis is present.   5. The inferior vena cava is normal in size with greater than 50% respiratory variability, suggesting right atrial pressure of 3 mmHg.   Comparison(s): No significant change from prior study.   Conclusion(s)/Recommendation(s): Normal biventricular function without evidence of hemodynamically significant valvular heart disease.  Cardiac Cath 05/04/20 Ost LAD to Prox LAD lesion is 30% stenosed. RPDA lesion is 50% stenosed. The left ventricular systolic function is normal. LV end diastolic pressure is mildly elevated. The left ventricular ejection fraction is 55-65% by visual estimate.   1.  Nonobstructive CAD. Unchanged from 2017. 2. Normal LV function 3. Mildly elevated LVEDP 20 mm Hg   Plan: medical management.    Coronary CTA 08/14/2016: IMPRESSION: 1. Coronary calcium score of 239. This was 17 percentile for age and sex matched control.   2. Normal coronary origin with right dominance.   3. 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 We will send additional analysis for CT FFT evaluation once available.   4. Mildly dilated pulmonary artery measuring 31 x 27 mm suspicious for pulmonary hypertension.    EKG:  EKG is personally reviewed. 07/21/2022:  Sinus rhythm. Rate 80 bpm.   Recent Labs: 04/17/2023: ALT 21; BUN 13; Creatinine, Ser 1.18; Hemoglobin 14.1; Platelets 308; Potassium 3.9; Sodium 137; TSH 2.410   Recent Lipid Panel    Component Value Date/Time   CHOL 210 (H) 04/17/2023 1552   TRIG 86 04/17/2023 1552   HDL 50 04/17/2023 1552   CHOLHDL 3.1 10/11/2022 0850   LDLCALC 145 (H) 04/17/2023 1552   LDLDIRECT 134 (H) 07/22/2013 1620          Physical Exam:    VS:  There were no vitals taken for this visit.    Wt Readings from Last 3 Encounters:  04/17/23 184 lb 8 oz (83.7 kg)  12/13/22 193 lb (87.5 kg)  12/04/22 198 lb (89.8 kg)     GEN:  Well nourished, well developed in no acute distress HEENT: Normal NECK: No JVD; No carotid bruits CARDIAC: RRR, no murmurs, rubs, gallops RESPIRATORY:  Clear to auscultation without rales, wheezing or rhonchi  ABDOMEN: Soft, non-tender, non-distended MUSCULOSKELETAL:  No edema; No deformity. LE tenderness on palpation  SKIN: Warm and dry NEUROLOGIC:  Alert and oriented x 3 PSYCHIATRIC:  Normal affect   ASSESSMENT:    No diagnosis found.    PLAN:    In order of problems listed above:  #Nonobstructive CAD: Patient with moderate, non-obstructive CAD on cath in 2021 similar to that seen in 2017. Myoview 11/2022 low risk with no ischemia. -Continue lisinopril 40mg  daily -Continue metop 50mg  XL  daily -Continue ranexa 500mg  BID  -Continue nexletol 180mg  daily -Continue repatha -Imdur stopped due to HA  #HTN: Very well controlled at home and at goal <120s/80s.  -Continue lisinopril 40mg  daily -Continue metop 50mg  XL daily -Continue amlodipine 5mg  daily  #HLD: #Statin intolerance. -Continue repatha and nexletol  #Palpitations: Significantly improved with cutting out caffeine. -Continue metop 50mg  XL daily  #DMII: -Now on Mounjaro with A1C 6.2  #Morbid Obesity: BMI dropped from 41>37. Continue diet and lifestyle as detailed below as well as mounjaro.  Exercise recommendations:  Goal of exercising for at least 30 minutes a day, at least 5 times per week.  Please exercise to a moderate exertion.  This means that while exercising it is difficult to speak in full sentences, however you are not so short of breath that you feel you must stop, and not so comfortable that you can carry on a full conversation.  Exertion level should be approximately a 5/10, if 10 is the most exertion you can perform.  Diet recommendations: Recommend a heart healthy diet such as the Mediterranean diet.  This diet consists of plant based foods, healthy fats, lean meats, olive oil.  It suggests limiting the intake of simple carbohydrates such as white breads, pastries, and pastas.  It also limits the amount of red meat, wine, and dairy products such as cheese that one should consume on a daily basis.   Follow-up:  6 months.    Medication Adjustments/Labs and Tests Ordered: Current medicines are reviewed at length with the patient today.  Concerns regarding medicines are outlined above.   No orders of the defined types were placed in this encounter.  No orders of the defined types were placed in this encounter.  There are no Patient Instructions on file for this visit.     Signed, Meriam Sprague, MD  05/16/2023 8:44 PM

## 2023-05-17 ENCOUNTER — Telehealth (HOSPITAL_BASED_OUTPATIENT_CLINIC_OR_DEPARTMENT_OTHER): Payer: Self-pay | Admitting: Cardiology

## 2023-05-17 ENCOUNTER — Ambulatory Visit (HOSPITAL_BASED_OUTPATIENT_CLINIC_OR_DEPARTMENT_OTHER): Payer: 59 | Admitting: Cardiology

## 2023-05-17 NOTE — Telephone Encounter (Signed)
Joyce Gross, you can offer the pt to see Dr. Shari Prows at [facility] on 7/30 at 2:20 pm. I held the slot for you.  She can see Dr. Shari Prows on that day, then we can advise on who she should establish her cardiac care with then.    Thanks for letting me know.

## 2023-05-17 NOTE — Telephone Encounter (Signed)
Patient came in office for her appointment with Dr. Shari Prows.  She was told her appointment time was 11:00 am--it was scheduled for 8:00 am---patient would like to know who Dr. Shari Prows which cardiologist does Dr. Shari Prows recommend she see for future care

## 2023-05-17 NOTE — Telephone Encounter (Signed)
Ardy[place], [place] - 05/17/2023 10:57 AM Leotis Pain  Sent: Thu May 17, 2023 12:10 PM  To: Loa Socks, LPN         Message  I just spoke with the patient and she is aware of her new appointment date and time----Tuesday 06/05/23 at 2:20 pm with Dr. Shari Prows at the [facility] location.  Thanks for your help

## 2023-06-03 NOTE — Progress Notes (Unsigned)
Cardiology Office Note:    Date:  06/05/2023   ID:  Haley [name], DOB February 19, 1956, MRN [id number]  PCP:  Julieanne Manson, MD   [facility] Providers Cardiologist:  Meriam Sprague, MD {   Referring MD: Julieanne Manson, MD   History of Present Illness:    Haley [name] is a 67 y.o. female with a hx of nonobstructive CAD on cath in 2017, DMII, HTN, and HLD who was previously followed by Dr. Delton See who now presents to clinic for follow-up.  Per review of the record, patient underwent coronary CTA in 2017 with calcium score of 239 which was 93 percentile, moderate diffuse CAD possible obstructive.  Cardiac catheterization 10/2016 mild nonobstructive disease in the LAD, moderate stenosis in the mid to distal PDA 50%. LV function normal. Medical therapy recommended.  Saw Dr. Delton See on 04/23/20 with SOB and chest pressure. Underwent repeat LHC on 05/04/20 which showed nonobstructive CAD similar to prior.   Was seen in the ER 10/29/22 for palpitations and dyspnea. Work-up was reassuring and she was discharged home. Follow-up myoview negative for ischemia. TTE with EF 55-60%, no WMA, no significant valve disease.  Was last seen in clinic on 12/2022 where she was doing well from a CV standpoint.  Today, the patient overall feeling well. No chest pain, SOB, LE edema, orthopnea or PND. No significant palpitations unless she has caffeine. No lightheadedness, dizziness or presyncope. Tolerating medications as prescribed. She is walking regularly without exertional symptoms. Has lost 50lbs with the zepbound.   Past Medical History:  Diagnosis Date   Back pain    Coronary artery disease    Depression    DM type 2, controlled, with complication (HCC) 07/22/2013   Type II DM diagnosed in ~ 2006 per pt hx.    Environmental and seasonal allergies    Ganglion cyst of wrist, right 05/09/2017   Hyperlipidemia    Hypertension    Itching    Refusal of blood transfusions as patient  is Jehovah's Witness    Shingles    Stroke (HCC) 2000   per pt. report:   TIA    Past Surgical History:  Procedure Laterality Date   ABDOMINAL HYSTERECTOMY  1994   Laparoscopic; ovaries still intact.  Performed for prolapsed uterus.   AREOLA/NIPPLE RECONSTRUCTION WITH GRAFT Bilateral 09/19/2021   Procedure: FREE NIPPLE GRAFT;  Surgeon: Allena Napoleon, MD;  Location: [facility] OR;  Service: Plastics;  Laterality: Bilateral;   BREAST REDUCTION SURGERY Bilateral 09/19/2021   Procedure: Bilateral Breast Reduction;  Surgeon: Allena Napoleon, MD;  Location: [facility] OR;  Service: Plastics;  Laterality: Bilateral;  2 hours   CARDIAC CATHETERIZATION N/A 10/23/2016   Procedure: Left Heart Cath and Coronary Angiography;  Surgeon: Kathleene Hazel, MD;  Location: [facility] INVASIVE CV LAB;  Service: Cardiovascular;  Laterality: N/A;   CHOLECYSTECTOMY  1997   Laparoscopic   COLONOSCOPY  2005-at age 48   Hyperplastic polyp--[place]   LEFT HEART CATH AND CORONARY ANGIOGRAPHY N/A 05/04/2020   Procedure: LEFT HEART CATH AND CORONARY ANGIOGRAPHY;  Surgeon: Swaziland, Peter M, MD;  Location: [facility] INVASIVE CV LAB;  Service: Cardiovascular;  Laterality: N/A;   REDUCTION MAMMAPLASTY Bilateral 09/19/2021   WISDOM TOOTH EXTRACTION      Current Medications: Current Meds  Medication Sig   amLODipine (NORVASC) 5 MG tablet 1 tab by mouth daily.   aspirin 81 MG tablet Take 81 mg by mouth daily.   Bempedoic Acid (NEXLETOL) 180 MG TABS Take  1 tablet (180 mg total) by mouth daily.   Calcium Citrate 250 MG TABS 2 tabs by mouth twice daily.   cetirizine (ZYRTEC) 10 MG tablet Take 10 mg by mouth daily.   Cholecalciferol (DIALYVITE VITAMIN D 5000) 125 MCG (5000 UT) capsule Take 15,000 Units by mouth daily.   Cyanocobalamin (B-12 PO) Take 1 tablet by mouth daily.   Evolocumab (REPATHA SURECLICK) 140 MG/ML SOAJ Inject 140 mg into the skin every 14 (fourteen) days.   GARLIC PO Take 5,573 capsules by mouth daily.   ibuprofen  (ADVIL) 200 MG tablet 2-4 tabs by mouth twice daily with meals as needed   Liniments (BLUE-EMU SUPER STRENGTH EX) Apply 1 application topically daily as needed (pain).   lisinopril (ZESTRIL) 40 MG tablet Take 1 tablet by mouth once daily   metoprolol succinate (TOPROL-XL) 50 MG 24 hr tablet Take 1 tablet (50 mg total) by mouth as needed (PALPITATIONS). Take with or immediately following a meal.   mometasone (NASONEX) 50 MCG/ACT nasal spray USE 2 SPRAY(S) IN EACH NOSTRIL ONCE DAILY   Multiple Vitamins-Minerals (MULTIVITAMIN WITH MINERALS) tablet Take 1 tablet by mouth daily.   nitroGLYCERIN (NITROSTAT) 0.4 MG SL tablet DISSOLVE ONE TABLET UNDER THE TONGUE EVERY 5 MINUTES AS NEEDED FOR CHEST PAIN.  DO NOT EXCEED A TOTAL OF 3 DOSES IN 15 MINUTES   omega-3 acid ethyl esters (LOVAZA) 1 g capsule Take 1 capsule (1 g total) by mouth 2 (two) times daily.   tirzepatide (MOUNJARO) 15 MG/0.5ML Pen Inject 15 mg into the skin once a week.   vitamin C (ASCORBIC ACID) 500 MG tablet Take 1,000 mg by mouth daily.   zinc gluconate 50 MG tablet Take 50 mg by mouth daily.     Allergies:   Praluent [alirocumab], Rosuvastatin, Zetia [ezetimibe], Atorvastatin, and Pravastatin   Social History   Socioeconomic History   Marital status: Single    Spouse name: Not on file   Number of children: 2   Years of education: 12+   Highest education level: Some college, no degree  Occupational History   Occupation: Print production planner    Comment: Retired now  Tobacco Use   Smoking status: Never    Passive exposure: Never   Smokeless tobacco: Never  Vaping Use   Vaping status: Never Used  Substance and Sexual Activity   Alcohol use: Yes    Alcohol/week: 0.0 standard drinks of alcohol    Comment: occasionally-1 to 2 drinks monthly    Drug use: No   Sexual activity: Not Currently    Birth control/protection: Surgical  Other Topics Concern   Not on file  Social History Narrative   Did get some post high school  training/education   Lives alone   [place] 2 great grandchildren frequently   Daughter lives in [place] and checks in regularly.   Social Determinants of Health   Financial Resource Strain: Low Risk  (04/18/2023)   Overall Financial Resource Strain (CARDIA)    Difficulty of Paying Living Expenses: Not hard at all  Food Insecurity: No Food Insecurity (04/18/2023)   Hunger Vital Sign    Worried About Running Out of Food in the Last Year: Never true    Ran Out of Food in the Last Year: Never true  Transportation Needs: No Transportation Needs (04/18/2023)   PRAPARE - Administrator, Civil Service (Medical): No    Lack of Transportation (Non-Medical): No  Physical Activity: Unknown (08/15/2021)   Received [facility], [facility]  Exercise Vital Sign    Days of Exercise per Week: Patient declined    Minutes of Exercise per Session: Not on file  Stress: Unknown (08/15/2021)   Received from [facility], [facility] - Occupational Stress Questionnaire    Feeling of Stress : Patient declined  Social Connections: Unknown (03/17/2022)   Received from [facility], [facility]   Social Network    Social Network: Not on file     Family History: The patient's family history includes Cancer (age of onset: 70) in her sister; Diabetes in her brother, father, sister, and son; Hypertension in her brother and son; Seizures in her brother; Stroke in her maternal aunt.  ROS:   Please see the history of present illness.       EKGs/Labs/Other Studies Reviewed:    The following studies were reviewed today:  2D-Echo 05/12/20 IMPRESSIONS   1. Left ventricular ejection fraction, by estimation, is 60 to 65%. The left ventricle has normal function. The left ventricle has no regional wall motion abnormalities. There is mild concentric left ventricular hypertrophy. Left ventricular diastolic parameters are indeterminate.   2. Right  ventricular systolic function is normal. The right ventricular size is normal. Tricuspid regurgitation signal is inadequate for assessing PA pressure.   3. The mitral valve is normal in structure. Trivial mitral valve  regurgitation. No evidence of mitral stenosis.   4. The aortic valve is tricuspid. Aortic valve regurgitation is not visualized. No aortic stenosis is present.   5. The inferior vena cava is normal in size with greater than 50% respiratory variability, suggesting right atrial pressure of 3 mmHg.   Comparison(s): No significant change from prior study.   Conclusion(s)/Recommendation(s): Normal biventricular function without evidence of hemodynamically significant valvular heart disease.  Cardiac Cath 05/04/20 Ost LAD to Prox LAD lesion is 30% stenosed. RPDA lesion is 50% stenosed. The left ventricular systolic function is normal. LV end diastolic pressure is mildly elevated. The left ventricular ejection fraction is 55-65% by visual estimate.   1. Nonobstructive CAD. Unchanged from 2017. 2. Normal LV function 3. Mildly elevated LVEDP 20 mm Hg   Plan: medical management.    Coronary CTA 08/14/2016: IMPRESSION: 1. Coronary calcium score of 239. This was 15 percentile for age and sex matched control.   2. Normal coronary origin with right dominance.   3. 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 We will send additional analysis for CT FFT evaluation once available.   4. Mildly dilated pulmonary artery measuring 31 x 27 mm suspicious for pulmonary hypertension.    EKG:  EKG is personally reviewed. NSR, low voltage   Recent Labs: 04/17/2023: ALT 21; BUN 13; Creatinine, Ser 1.18; Hemoglobin 14.1; Platelets 308; Potassium 3.9; Sodium 137; TSH 2.410   Recent Lipid Panel    Component Value Date/Time   CHOL 210 (H) 04/17/2023 1552   TRIG 86 04/17/2023 1552   HDL 50 04/17/2023 1552   CHOLHDL 3.1 10/11/2022 0850   LDLCALC 145 (H) 04/17/2023 1552   LDLDIRECT 134 (H)  07/22/2013 1620          Physical Exam:    VS:  BP (!) 138/90   Pulse 75   Ht 5\' 1"  (1.549 m)   Wt 185 lb 3.2 oz (84 kg)   SpO2 98%   BMI 34.99 kg/m     Wt Readings from Last 3 Encounters:  06/05/23 185 lb 3.2 oz (84 kg)  04/17/23 184 lb 8 oz (83.7 kg)  12/13/22 193 lb (87.5 kg)     GEN:  Well nourished, well developed in no acute distress HEENT: Normal NECK: No JVD; No carotid bruits CARDIAC: RRR, no murmurs, rubs, gallops RESPIRATORY:  Clear to auscultation without rales, wheezing or rhonchi  ABDOMEN: Soft, non-tender, non-distended MUSCULOSKELETAL:  No edema; No deformity. LE tenderness on palpation  SKIN: Warm and dry NEUROLOGIC:  Alert and oriented x 3 PSYCHIATRIC:  Normal affect   ASSESSMENT:    1. Coronary artery disease involving native coronary artery of native heart without angina pectoris   2. Mixed hyperlipidemia   3. Essential hypertension   4. Chest discomfort   5. DM type 2, controlled, with complication (HCC)   6. Hyperlipidemia, unspecified hyperlipidemia type       PLAN:    In order of problems listed above:  #Nonobstructive CAD: Patient with moderate, non-obstructive CAD on cath in 2021 similar to that seen in 2017. Myoview 11/2022 low risk with no ischemia. -Continue lisinopril 40mg  daily -Continue metop 50mg  XL daily -Continue ranexa 500mg  BID  -Continue nexletol 180mg  daily -Continue repatha -Imdur stopped due to HA  #HTN: Very well controlled at home and at goal <120s/80s.  -Continue lisinopril 40mg  daily -Continue metop 50mg  XL daily -Continue amlodipine 5mg  daily  #HLD: #Statin intolerance: -Continue repatha and nexletol  #Palpitations: Significantly improved with cutting out caffeine. -Continue metop 50mg  XL daily  #DMII: -Now on Mounjaro with A1C 5.7  #Morbid Obesity: BMI dropped from 41>34.9. Continue diet and lifestyle as detailed below as well as mounjaro.  Exercise recommendations: Goal of exercising for at  least 30 minutes a day, at least 5 times per week.  Please exercise to a moderate exertion.  This means that while exercising it is difficult to speak in full sentences, however you are not so short of breath that you feel you must stop, and not so comfortable that you can carry on a full conversation.  Exertion level should be approximately a 5/10, if 10 is the most exertion you can perform.  Diet recommendations: Recommend a heart healthy diet such as the Mediterranean diet.  This diet consists of plant based foods, healthy fats, lean meats, olive oil.  It suggests limiting the intake of simple carbohydrates such as white breads, pastries, and pastas.  It also limits the amount of red meat, wine, and dairy products such as cheese that one should consume on a daily basis.   Follow-up:  6 months.    Medication Adjustments/Labs and Tests Ordered: Current medicines are reviewed at length with the patient today.  Concerns regarding medicines are outlined above.   Orders Placed This Encounter  Procedures   EKG 12-Lead   No orders of the defined types were placed in this encounter.  Patient Instructions  Medication Instructions:   Your physician recommends that you continue on your current medications as directed. Please refer to the Current Medication list given to you today.  *If you need a refill on your cardiac medications before your next appointment, please call your pharmacy*    Follow-Up: At [facility], you and your health needs are our priority.  As part of our continuing mission to provide you with exceptional heart care, we have created designated Provider Care Teams.  These Care Teams include your primary Cardiologist (physician) and Advanced Practice Providers (APPs -  Physician Assistants and Nurse Practitioners) who all work together to provide you with the care you need, when you need it.  We recommend signing up for  the patient portal called "MyChart".  Sign up  information is provided on this After Visit Summary.  MyChart is used to connect with patients for Virtual Visits (Telemedicine).  Patients are able to view lab/test results, encounter notes, upcoming appointments, etc.  Non-urgent messages can be sent to your provider as well.   To learn more about what you can do with MyChart, go to [email or web address].    Your next appointment:   6 month(s)  Provider:   Dr. Jacques Navy       Signed, Meriam Sprague, MD  06/05/2023 3:18 PM

## 2023-06-05 ENCOUNTER — Encounter: Payer: Self-pay | Admitting: Cardiology

## 2023-06-05 ENCOUNTER — Ambulatory Visit: Payer: 59 | Admitting: Cardiology

## 2023-06-05 VITALS — BP 138/90 | HR 75 | Ht 61.0 in | Wt 185.2 lb

## 2023-06-05 DIAGNOSIS — I10 Essential (primary) hypertension: Secondary | ICD-10-CM | POA: Diagnosis not present

## 2023-06-05 DIAGNOSIS — I2510 Atherosclerotic heart disease of native coronary artery without angina pectoris: Secondary | ICD-10-CM | POA: Diagnosis not present

## 2023-06-05 DIAGNOSIS — R0789 Other chest pain: Secondary | ICD-10-CM | POA: Diagnosis not present

## 2023-06-05 DIAGNOSIS — Z7985 Long-term (current) use of injectable non-insulin antidiabetic drugs: Secondary | ICD-10-CM

## 2023-06-05 DIAGNOSIS — E118 Type 2 diabetes mellitus with unspecified complications: Secondary | ICD-10-CM

## 2023-06-05 DIAGNOSIS — E785 Hyperlipidemia, unspecified: Secondary | ICD-10-CM

## 2023-06-05 DIAGNOSIS — E782 Mixed hyperlipidemia: Secondary | ICD-10-CM | POA: Diagnosis not present

## 2023-06-05 NOTE — Patient Instructions (Signed)
Medication Instructions:   Your physician recommends that you continue on your current medications as directed. Please refer to the Current Medication list given to you today.  *If you need a refill on your cardiac medications before your next appointment, please call your pharmacy*    Follow-Up: At [facility], you and your health needs are our priority.  As part of our continuing mission to provide you with exceptional heart care, we have created designated Provider Care Teams.  These Care Teams include your primary Cardiologist (physician) and Advanced Practice Providers (APPs -  Physician Assistants and Nurse Practitioners) who all work together to provide you with the care you need, when you need it.  We recommend signing up for the patient portal called "MyChart".  Sign up information is provided on this After Visit Summary.  MyChart is used to connect with patients for Virtual Visits (Telemedicine).  Patients are able to view lab/test results, encounter notes, upcoming appointments, etc.  Non-urgent messages can be sent to your provider as well.   To learn more about what you can do with MyChart, go to [email or web address].    Your next appointment:   6 month(s)  Provider:   Dr. Jacques Navy

## 2023-06-11 ENCOUNTER — Ambulatory Visit: Payer: 59 | Admitting: Cardiology

## 2023-07-20 ENCOUNTER — Other Ambulatory Visit: Payer: Self-pay | Admitting: Internal Medicine

## 2023-07-27 ENCOUNTER — Ambulatory Visit (INDEPENDENT_AMBULATORY_CARE_PROVIDER_SITE_OTHER): Payer: 59 | Admitting: Internal Medicine

## 2023-07-27 DIAGNOSIS — J0140 Acute pansinusitis, unspecified: Secondary | ICD-10-CM

## 2023-07-27 DIAGNOSIS — Z23 Encounter for immunization: Secondary | ICD-10-CM

## 2023-07-27 MED ORDER — FLUCONAZOLE 150 MG PO TABS: ORAL_TABLET | ORAL | 0 refills | Status: DC

## 2023-07-27 MED ORDER — MICONAZOLE NITRATE 2 % VA CREA: TOPICAL_CREAM | VAGINAL | Status: DC

## 2023-07-27 MED ORDER — AMOXICILLIN-POT CLAVULANATE 875-125 MG PO TABS: 1.0000 | ORAL_TABLET | Freq: Two times a day (BID) | ORAL | 0 refills | Status: DC

## 2023-07-27 NOTE — Progress Notes (Signed)
Subjective:    Patient ID: Haley [name], female   DOB: 20-Sep-1956, 67 y.o.   MRN: [id number]   HPI  Here for Fluad vaccination, but has also had sinus pressure for a month.  Using her allergy meds regularly.  No fever.  Nasal discharge is clear.   No posterior pharyngeal drainage   No outpatient medications have been marked as taking for the 07/27/23 encounter (Immunization) with Julieanne Manson, MD.   Allergies  Allergen Reactions   Praluent [Alirocumab]     Had significant fatigue and memory loss--was lost driving in town.  Joint pain and nausea as well.   Rosuvastatin     Leg cramping on 5-10mg  daily   Zetia [Ezetimibe] Other (See Comments)    Joint pain and ear ringing   Atorvastatin Other (See Comments)    Pt states causes bilateral lower extremity muscle cramps   Pravastatin Nausea And Vomiting and Other (See Comments)    Causes dizziness     Review of Systems    Objective:   There were no vitals taken for this visit.  Physical Exam HEENT:  PERRL, EOMI.  Tender over frontal and max sinus areas.  Nasal mucosa swollen with milky nasal discharge.  Unable to view posterior pharynx well, but tonsil area without erythema     Assessment & Plan   HM:  Fluad given  2.  Sinusitis:  Augmentin 875 mg twice daily for 10 days.  Use 7 day OTC vaginal yeast treatment if symptoms during abx.  Fluconazole 150 mg daily for 2 days after treatment if still with vaginal yeast symptoms at that time.

## 2023-08-06 ENCOUNTER — Ambulatory Visit: Payer: 59 | Admitting: Cardiology

## 2023-08-22 ENCOUNTER — Other Ambulatory Visit: Payer: Self-pay | Admitting: Internal Medicine

## 2023-10-17 ENCOUNTER — Other Ambulatory Visit (INDEPENDENT_AMBULATORY_CARE_PROVIDER_SITE_OTHER): Payer: 59

## 2023-10-17 DIAGNOSIS — E118 Type 2 diabetes mellitus with unspecified complications: Secondary | ICD-10-CM

## 2023-10-18 ENCOUNTER — Other Ambulatory Visit: Payer: Self-pay | Admitting: Internal Medicine

## 2023-10-18 DIAGNOSIS — Z1231 Encounter for screening mammogram for malignant neoplasm of breast: Secondary | ICD-10-CM

## 2023-10-18 LAB — HEMOGLOBIN A1C
Est. average glucose Bld gHb Est-mCnc: 114 mg/dL
Hgb A1c MFr Bld: 5.6 % (ref 4.8–5.6)

## 2023-11-02 ENCOUNTER — Telehealth: Payer: Self-pay

## 2023-11-02 ENCOUNTER — Other Ambulatory Visit (HOSPITAL_COMMUNITY): Payer: Self-pay

## 2023-11-02 DIAGNOSIS — E782 Mixed hyperlipidemia: Secondary | ICD-10-CM

## 2023-11-02 NOTE — Telephone Encounter (Signed)
Pharmacy Patient Advocate Encounter   Received notification from CoverMyMeds that prior authorization for NEXLETOL is required/requested.   Insurance verification completed.   The patient is insured through [employer] .   Per test claim: PA required; PA submitted to above mentioned insurance via CoverMyMeds Key/confirmation #/EOC [id number] Status is pending

## 2023-11-02 NOTE — Telephone Encounter (Signed)
Prior auth for Nexletol renewal needs LDL labs within the last 120 days. I think if we submit what we have, the PA will get denied. Can we have patient come in for lipids?

## 2023-11-15 ENCOUNTER — Ambulatory Visit
Admission: RE | Admit: 2023-11-15 | Discharge: 2023-11-15 | Disposition: A | Discharge status: Home / Self Care | Payer: 59 | Source: Ambulatory Visit | Attending: Internal Medicine | Admitting: Internal Medicine

## 2023-11-15 DIAGNOSIS — Z1231 Encounter for screening mammogram for malignant neoplasm of breast: Secondary | ICD-10-CM

## 2023-11-16 NOTE — Addendum Note (Signed)
 Addended by: Malena Peer D on: 11/16/2023 08:40 AM   Modules accepted: Orders

## 2023-11-16 NOTE — Telephone Encounter (Signed)
 Patient will come today for lab work. She is not taking reaptha (will remove from med list) but is taking Nexletol. Will need to address why she isnt taking Repatha when labs result.

## 2023-11-17 LAB — URIC ACID
Uric Acid: 6.5 mg/dL (ref 3.0–7.2)
Uric Acid: 6.4 mg/dL (ref 3.0–7.2)

## 2023-11-17 LAB — LIPID PANEL
Chol/HDL Ratio: 3.6 {ratio} (ref 0.0–4.4)
Cholesterol, Total: 189 mg/dL (ref 100–199)
HDL: 52 mg/dL (ref >39)
LDL Chol Calc (NIH): 127 mg/dL — ABNORMAL HIGH (ref 0–99)
Triglycerides: 50 mg/dL (ref 0–149)
VLDL Cholesterol Cal: 10 mg/dL (ref 5–40)

## 2023-11-19 ENCOUNTER — Other Ambulatory Visit (HOSPITAL_COMMUNITY): Payer: Self-pay

## 2023-11-19 ENCOUNTER — Telehealth: Payer: Self-pay | Admitting: Pharmacy Technician

## 2023-11-19 MED ORDER — ROSUVASTATIN CALCIUM 5 MG PO TABS: 5.0000 mg | ORAL_TABLET | ORAL | 3 refills | Status: DC

## 2023-11-19 NOTE — Telephone Encounter (Signed)
 Called pt and LVM for her to call back. LDL-C 127, uric acid normal. Patient reported the other day she was not taking Repatha. Need to figure out why she stopped. LDL-C is now above goal.

## 2023-11-19 NOTE — Addendum Note (Signed)
 Addended by: Malena Peer D on: 11/19/2023 11:54 AM   Modules accepted: Orders

## 2023-11-19 NOTE — Telephone Encounter (Signed)
 Pharmacy Patient Advocate Encounter   Received notification from Pt Calls Messages that prior authorization for nexletol  is required/requested.   Insurance verification completed.   The patient is insured through [facility] .   Per test claim: Refill too soon. PA is not needed at this time. Medication was filled 11/01/23. Next eligible fill date is 01/08/24.  PA Renewed: 11/07/23- 11/05/24 (Case: [id number])

## 2023-11-19 NOTE — Telephone Encounter (Signed)
 Spoke with patient. States Repatha  was causing her a lot of cramps. This has gone away after stopping. She is intolerant to atrovastatin, rosuvastatin , prvastatin, zetia, praulent and Repatha . She is willing to try rosuvastatin  5mg  twice a week. Will follow up in 2 months to see how she is doing. Continue Nexletol .

## 2023-12-04 ENCOUNTER — Ambulatory Visit: Payer: 59 | Attending: Internal Medicine | Admitting: Internal Medicine

## 2023-12-04 VITALS — BP 140/82 | HR 82 | Ht 61.0 in | Wt 177.4 lb

## 2023-12-04 DIAGNOSIS — R002 Palpitations: Secondary | ICD-10-CM

## 2023-12-04 DIAGNOSIS — E118 Type 2 diabetes mellitus with unspecified complications: Secondary | ICD-10-CM

## 2023-12-04 DIAGNOSIS — R0789 Other chest pain: Secondary | ICD-10-CM | POA: Diagnosis not present

## 2023-12-04 DIAGNOSIS — I10 Essential (primary) hypertension: Secondary | ICD-10-CM | POA: Diagnosis not present

## 2023-12-04 DIAGNOSIS — I2510 Atherosclerotic heart disease of native coronary artery without angina pectoris: Secondary | ICD-10-CM | POA: Diagnosis not present

## 2023-12-04 DIAGNOSIS — E782 Mixed hyperlipidemia: Secondary | ICD-10-CM

## 2023-12-04 DIAGNOSIS — Z79899 Other long term (current) drug therapy: Secondary | ICD-10-CM

## 2023-12-04 MED ORDER — METOPROLOL SUCCINATE ER 50 MG PO TB24: 50.0000 mg | ORAL_TABLET | ORAL | 1 refills | Status: DC | PRN

## 2023-12-04 NOTE — Progress Notes (Signed)
Cardiology Office Note:    Date:  12/04/2023  ID:  Haley [name], DOB [birth year], MRN [id number]  PCP:  Julieanne Manson, MD   [facility] Providers Cardiologist:  Parke Poisson, MD {   Referring MD: Julieanne Manson, MD   History of Present Illness:    Haley [name] is a 68 y.o. female with a hx of nonobstructive CAD on cath in 2017, DMII, HTN, and HLD who was previously followed by Dr. Delton See who now presents to clinic for follow-up.  Discussed the use of AI scribe software for clinical note transcription with the patient, who gave verbal consent to proceed.  History of Present Illness   The patient, with a history of high blood pressure, high cholesterol, and CAD, presents with severe muscle cramps, which she attributes to the use of statins. The patient has tried various medications, including Repatha injections and Nexletol, but the cramps have persisted. The patient reports that the cramps are constant, especially at night, and have disrupted her sleep. The patient has discontinued all medications due to the severity of the cramps.  In addition to the muscle cramps, the patient has been experiencing chest pain during exertion. The patient reports that the pain occurs when walking at a fast pace or over long distances. To improve her cardiovascular health and manage the chest pain, the patient has joined a gym and has been using the treadmill. The patient expresses a desire to be able to walk without experiencing chest pain or pressure in her chest. She did not start ranolazine due to concern of side effects.      Per review of the record, patient underwent coronary CTA in 2017 with calcium score of 239 which was 93 percentile, moderate diffuse CAD possible obstructive.  Cardiac catheterization 10/2016 mild nonobstructive disease in the LAD, moderate stenosis in the mid to distal PDA 50%. LV function normal. Medical therapy recommended.  Saw Dr. Delton See on 04/23/20  with SOB and chest pressure. Underwent repeat LHC on 05/04/20 which showed nonobstructive CAD similar to prior.   Was seen in the ER 10/29/22 for palpitations and dyspnea. Work-up was reassuring and she was discharged home. Follow-up myoview negative for ischemia. TTE with EF 55-60%, no WMA, no significant valve disease.  Was last seen in clinic on 12/2022 where she was doing well from a CV standpoint.  Today, the patient overall feeling well. No chest pain, SOB, LE edema, orthopnea or PND. No significant palpitations unless she has caffeine. No lightheadedness, dizziness or presyncope. Tolerating medications as prescribed. She is walking regularly without exertional symptoms. Has lost 50lbs with the zepbound.   Past Medical History:  Diagnosis Date   Back pain    Coronary artery disease    Depression    DM type 2, controlled, with complication (HCC) 07/22/2013   Type II DM diagnosed in ~ 2006 per pt hx.    Environmental and seasonal allergies    Ganglion cyst of wrist, right 05/09/2017   Hyperlipidemia    Hypertension    Itching    Refusal of blood transfusions as patient is Jehovah's Witness    Shingles    Stroke (HCC) 2000   per pt. report:   TIA    Past Surgical History:  Procedure Laterality Date   ABDOMINAL HYSTERECTOMY  1994   Laparoscopic; ovaries still intact.  Performed for prolapsed uterus.   AREOLA/NIPPLE RECONSTRUCTION WITH GRAFT Bilateral 09/19/2021   Procedure: FREE NIPPLE GRAFT;  Surgeon: Allena Napoleon, MD;  Location: [facility] OR;  Service: Plastics;  Laterality: Bilateral;   BREAST REDUCTION SURGERY Bilateral 09/19/2021   Procedure: Bilateral Breast Reduction;  Surgeon: Allena Napoleon, MD;  Location: [facility] OR;  Service: Plastics;  Laterality: Bilateral;  2 hours   CARDIAC CATHETERIZATION N/A 10/23/2016   Procedure: Left Heart Cath and Coronary Angiography;  Surgeon: Kathleene Hazel, MD;  Location[facility] INVASIVE CV LAB;  Service: Cardiovascular;  Laterality: N/A;    CHOLECYSTECTOMY  1997   Laparoscopic   COLONOSCOPY  2005-at age 78   Hyperplastic polyp--[place]   LEFT HEART CATH AND CORONARY ANGIOGRAPHY N/A 05/04/2020   Procedure: LEFT HEART CATH AND CORONARY ANGIOGRAPHY;  Surgeon: [place], Peter M, MD;  Location: [facility] INVASIVE CV LAB;  Service: Cardiovascular;  Laterality: N/A;   WISDOM TOOTH EXTRACTION      Current Medications: Current Meds  Medication Sig   amLODipine (NORVASC) 5 MG tablet Has been taking 5mg  daily   aspirin 81 MG tablet Take 81 mg by mouth daily.   Calcium Citrate 250 MG TABS 2 tabs by mouth twice daily.   cetirizine (ZYRTEC) 10 MG tablet Take 10 mg by mouth daily.   Cholecalciferol (DIALYVITE VITAMIN D 5000) 125 MCG (5000 UT) capsule Take 15,000 Units by mouth daily.   Cyanocobalamin (B-12 PO) Take 1 tablet by mouth daily.   Flaxseed, Linseed, (FLAXSEED OIL) 1000 MG CAPS Take 1,000 mg by mouth daily at 8 pm.   GARLIC PO Take 1,914 mg by mouth daily.   ibuprofen (ADVIL) 200 MG tablet 2-4 tabs by mouth twice daily with meals as needed   Liniments (BLUE-EMU SUPER STRENGTH EX) Apply 1 application topically daily as needed (pain).   lisinopril (ZESTRIL) 40 MG tablet Take 1 tablet by mouth once daily   miconazole (MONISTAT 7) 2 % vaginal cream Use during treatment with augmentin if needed for vaginal yeast symptoms  use fluconazole for after treatment with Augmentin if symptoms   mometasone (NASONEX) 50 MCG/ACT nasal spray USE 2 SPRAY(S) IN EACH NOSTRIL ONCE DAILY   Multiple Vitamins-Minerals (MULTIVITAMIN WITH MINERALS) tablet Take 1 tablet by mouth daily.   nitroGLYCERIN (NITROSTAT) 0.4 MG SL tablet DISSOLVE ONE TABLET UNDER THE TONGUE EVERY 5 MINUTES AS NEEDED FOR CHEST PAIN.  DO NOT EXCEED A TOTAL OF 3 DOSES IN 15 MINUTES   omega-3 acid ethyl esters (LOVAZA) 1 g capsule Take 1 capsule (1 g total) by mouth 2 (two) times daily.   rosuvastatin (CRESTOR) 5 MG tablet Take 1 tablet (5 mg total) by mouth 2 (two) times a week.    tirzepatide (MOUNJARO) 15 MG/0.5ML Pen Inject 15 mg into the skin once a week.   vitamin C (ASCORBIC ACID) 500 MG tablet Take 1,000 mg by mouth daily.   zinc gluconate 50 MG tablet Take 50 mg by mouth daily.   [DISCONTINUED] Bempedoic Acid (NEXLETOL) 180 MG TABS Take 1 tablet (180 mg total) by mouth daily.   [DISCONTINUED] metoprolol succinate (TOPROL-XL) 50 MG 24 hr tablet Take 1 tablet (50 mg total) by mouth as needed (PALPITATIONS). Take with or immediately following a meal.     Allergies:   Praluent [alirocumab], Repatha [evolocumab], Rosuvastatin, Zetia [ezetimibe], Atorvastatin, and Pravastatin   Social History   Socioeconomic History   Marital status: Single    Spouse name: Not on file   Number of children: 2   Years of education: 12+   Highest education level: Some college, no degree  Occupational History   Occupation: Print production planner  Comment: Retired now  Tobacco Use   Smoking status: Never    Passive exposure: Never   Smokeless tobacco: Never  Vaping Use   Vaping status: Never Used  Substance and Sexual Activity   Alcohol use: Yes    Alcohol/week: 0.0 standard drinks of alcohol    Comment: occasionally-1 to 2 drinks monthly    Drug use: No   Sexual activity: Not Currently    Birth control/protection: Surgical  Other Topics Concern   Not on file  Social History Narrative   Did get some post high school training/education   Lives alone   [place] 2 great grandchildren frequently   Daughter lives in [place] and checks in regularly.   Social Drivers of Corporate investment banker Strain: Low Risk  (04/18/2023)   Overall Financial Resource Strain (CARDIA)    Difficulty of Paying Living Expenses: Not hard at all  Food Insecurity: No Food Insecurity (04/18/2023)   Hunger Vital Sign    Worried About Running Out of Food in the Last Year: Never true    Ran Out of Food in the Last Year: Never true  Transportation Needs: No Transportation Needs (04/18/2023)   PRAPARE  - Administrator, Civil Service (Medical): No    Lack of Transportation (Non-Medical): No  Physical Activity: Unknown (08/15/2021)   Received from [facility], [facility]   Exercise Vital Sign    Days of Exercise per Week: Patient declined    Minutes of Exercise per Session: Not on file  Stress: Unknown (08/15/2021)   Received from [facility], [facility] Occupational [facility] - Occupational Stress Questionnaire    Feeling of Stress : Patient declined  Social Connections: Unknown (03/17/2022)   Received from [facility], [facility]   Social Network    Social Network: Not on file     Family History: The patient's family history includes Cancer (age of onset: 85) in her sister; Diabetes in her brother, father, sister, and son; Hypertension in her brother and son; Seizures in her brother; Stroke in her maternal aunt.  ROS:   Please see the history of present illness.       EKGs/Labs/Other Studies Reviewed:    The following studies were reviewed today:  2D-Echo 05/12/20 IMPRESSIONS   1. Left ventricular ejection fraction, by estimation, is 60 to 65%. The left ventricle has normal function. The left ventricle has no regional wall motion abnormalities. There is mild concentric left ventricular hypertrophy. Left ventricular diastolic parameters are indeterminate.   2. Right ventricular systolic function is normal. The right ventricular size is normal. Tricuspid regurgitation signal is inadequate for assessing PA pressure.   3. The mitral valve is normal in structure. Trivial mitral valve  regurgitation. No evidence of mitral stenosis.   4. The aortic valve is tricuspid. Aortic valve regurgitation is not visualized. No aortic stenosis is present.   5. The inferior vena cava is normal in size with greater than 50% respiratory variability, suggesting right atrial pressure of 3 mmHg.   Comparison(s): No significant change from prior study.    Conclusion(s)/Recommendation(s): Normal biventricular function without evidence of hemodynamically significant valvular heart disease.  Cardiac Cath 05/04/20 Ost LAD to Prox LAD lesion is 30% stenosed. RPDA lesion is 50% stenosed. The left ventricular systolic function is normal. LV end diastolic pressure is mildly elevated. The left ventricular ejection fraction is 55-65% by visual estimate.   1. Nonobstructive CAD. Unchanged from 2017. 2. Normal LV function  3. Mildly elevated LVEDP 20 mm Hg   Plan: medical management.    Coronary CTA 08/14/2016: IMPRESSION: 1. Coronary calcium score of 239. This was 24 percentile for age and sex matched control.   2. Normal coronary origin with right dominance.   3. 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 We will send additional analysis for CT FFT evaluation once available.   4. Mildly dilated pulmonary artery measuring 31 x 27 mm suspicious for pulmonary hypertension.    EKG:     N/a   Recent Labs: 04/17/2023: ALT 21; BUN 13; Creatinine, Ser 1.18; Hemoglobin 14.1; Platelets 308; Potassium 3.9; Sodium 137; TSH 2.410   Recent Lipid Panel    Component Value Date/Time   CHOL 189 11/16/2023 1045   TRIG 50 11/16/2023 1045   HDL 52 11/16/2023 1045   CHOLHDL 3.6 11/16/2023 1045   LDLCALC 127 (H) 11/16/2023 1045   LDLDIRECT 134 (H) 07/22/2013 1620          Physical Exam:    VS:  BP (!) 140/82 (BP Location: Left Arm, Patient Position: Sitting, Cuff Size: Large)   Pulse 82   Ht 5\' 1"  (1.549 m)   Wt 177 lb 6.4 oz (80.5 kg)   SpO2 98%   BMI 33.52 kg/m     Wt Readings from Last 3 Encounters:  12/04/23 177 lb 6.4 oz (80.5 kg)  06/05/23 185 lb 3.2 oz (84 kg)  04/17/23 184 lb 8 oz (83.7 kg)    GEN: No acute distress.   Neck: No JVD Cardiac: RRR, no murmurs, rubs, or gallops.  Respiratory: Clear to auscultation bilaterally. GI: Soft, nontender, non-distended  MS: No edema; No deformity. Neuro:  Nonfocal  Psych: Normal  affect    ASSESSMENT:    1. Mixed hyperlipidemia   2. Coronary artery disease involving native coronary artery of native heart without angina pectoris   3. Essential hypertension   4. Chest discomfort   5. DM type 2, controlled, with complication (HCC)   6. Palpitations   7. Medication management       PLAN:    Assessment and Plan    Hyperlipidemia Statin intolerance with muscle cramps. Currently on Crestor 5mg  twice weekly with some cramping but not as severe. LDL 127, goal <70 due to presence of plaque in arteries. -Continue Crestor 5mg  twice weekly. -Add CoQ10 and consider adding Magnesium Oxide 200-400mg  daily to help with muscle cramps. -Consider future options such as Inclisiran if current regimen is not tolerated or effective.  Angina Exertional chest pain, no change in frequency or severity. Last stress test normal in January of the previous year. -Continue current medications including Amlodipine 5mg  daily, Lisinopril 40mg  daily, Metoprolol 50mg  daily. -Consider PET stress test if symptoms worsen to evaluate for microvascular dysfunction given exertional angina with nonobstructive CAD.  Hypertension Well controlled on current regimen of Amlodipine 5mg  daily and Lisinopril 40mg  daily. -Continue current regimen.  Diabetes Well controlled with A1c of 5.6 in December. -Continue current regimen.  Follow-up in 6 months.     Refills: metoprolol succ 50 mg daily     Medication Adjustments/Labs and Tests Ordered: Current medicines are reviewed at length with the patient today.  Concerns regarding medicines are outlined above.   No orders of the defined types were placed in this encounter.  Meds ordered this encounter  Medications   metoprolol succinate (TOPROL-XL) 50 MG 24 hr tablet    Sig: Take 1 tablet (50 mg total) by mouth as needed (PALPITATIONS). Take with or  immediately following a meal.    Dispense:  90 tablet    Refill:  1   Patient Instructions   Medication Instructions:  Your physician recommends that you continue on your current medications as directed. Please refer to the Current Medication list given to you today.  *If you need a refill on your cardiac medications before your next appointment, please call your pharmacy*  Lab Work: None  Testing/Procedures: None  Follow-Up: At [facility], you and your health needs are our priority.  As part of our continuing mission to provide you with exceptional heart care, we have created designated Provider Care Teams.  These Care Teams include your primary Cardiologist (physician) and Advanced Practice Providers (APPs -  Physician Assistants and Nurse Practitioners) who all work together to provide you with the care you need, when you need it.  Your next appointment:   6 month(s)  Provider:   Parke Poisson, MD

## 2023-12-04 NOTE — Patient Instructions (Signed)
Medication Instructions:  Your physician recommends that you continue on your current medications as directed. Please refer to the Current Medication list given to you today.  *If you need a refill on your cardiac medications before your next appointment, please call your pharmacy*  Lab Work: None  Testing/Procedures: None  Follow-Up: At [facility], you and your health needs are our priority.  As part of our continuing mission to provide you with exceptional heart care, we have created designated Provider Care Teams.  These Care Teams include your primary Cardiologist (physician) and Advanced Practice Providers (APPs -  Physician Assistants and Nurse Practitioners) who all work together to provide you with the care you need, when you need it.  Your next appointment:   6 month(s)  Provider:   Parke Poisson, MD

## 2023-12-05 ENCOUNTER — Encounter: Payer: Self-pay | Admitting: Internal Medicine

## 2023-12-14 ENCOUNTER — Telehealth: Payer: Self-pay

## 2023-12-14 NOTE — Telephone Encounter (Signed)
 Patient has been scheduled

## 2023-12-14 NOTE — Telephone Encounter (Signed)
 Patient would like to be on wait list for lower abdominal pain. Pain is in the center of her lower abdomen. Pain is constant since Wednesday 12/12/23. Has had similar reoccurring episodes all last year, but never got medication attention for it. This episode is the first time she also has nausea. Patient reports having normal bowl movements. Patient is not taking any medication for this problem.  Will call patient once an appointment becomes available.

## 2023-12-17 ENCOUNTER — Ambulatory Visit (INDEPENDENT_AMBULATORY_CARE_PROVIDER_SITE_OTHER): Payer: 59 | Admitting: Internal Medicine

## 2023-12-17 ENCOUNTER — Encounter: Payer: Self-pay | Admitting: Internal Medicine

## 2023-12-17 VITALS — BP 138/82 | HR 73 | Resp 18 | Ht 61.5 in | Wt 177.0 lb

## 2023-12-17 DIAGNOSIS — K5790 Diverticulosis of intestine, part unspecified, without perforation or abscess without bleeding: Secondary | ICD-10-CM | POA: Diagnosis not present

## 2023-12-17 DIAGNOSIS — R1030 Lower abdominal pain, unspecified: Secondary | ICD-10-CM

## 2023-12-17 LAB — POCT URINALYSIS DIPSTICK
Bilirubin, UA: NEGATIVE
Glucose, UA: NEGATIVE
Ketones, UA: NEGATIVE
Leukocytes, UA: NEGATIVE
Nitrite, UA: NEGATIVE
Protein, UA: NEGATIVE
Spec Grav, UA: 1.015 (ref 1.010–1.025)
Urobilinogen, UA: 0.2 U/dL — AB
pH, UA: 7.5 (ref 5.0–8.0)

## 2023-12-17 NOTE — Progress Notes (Signed)
 Subjective:    Patient ID: Haley [name], female   DOB: [birth year], 68 y.o.   MRN: [id number]   HPI  Has had this problem with abdominal pain for 4-5 years or more..  Lower abdomen--points to suprapubic area, having a deep pain.  Usually starts as a dull aching pain that gradually builds.  Sort of feels like she needs to pass a BM, but never has had a BM when she has the pain.  If passes flatus, no relief.  Generally, lasts 2-3 hours. In the last 3 months, she has developed associated pain in right low back area --resolves when anterior pain resolves.  Never has fever associated.   Generally no nausea or vomiting.   Friday morning or 3 days ago, awakened her from sleep and .  She felt nausea with this and belch--felt like fluid or bile/acid was coming up and then going back down.  Was downtown with increase the pain about 3 hours later  and when headed to car, she got dizzy and stumbled and just felt like she had bile stuck in her chest she could not bring up. She took a nap and pain was significantly decreased.  The pain has never completely resolved since 3 days ago--still with mild symptoms in low abdomen and right low back.    She does have an abdominal hernia, which seems to be a bit bigger now as well.  She has not paid attention to whether the hernia seems distended with the pain.   Had CT of abd/pelvis in 2016 that showed diverticulosis and her perumbilical hernia only with fat unchanged from previous studies. Colonoscopy last 2021 and showed internal hemorrhoids and diverticulosis.    Current Meds  Medication Sig   amLODipine (NORVASC) 5 MG tablet Has been taking 5mg  daily   aspirin 81 MG tablet Take 81 mg by mouth daily.   Calcium Citrate 250 MG TABS 2 tabs by mouth twice daily.   cetirizine (ZYRTEC) 10 MG tablet Take 10 mg by mouth daily.   Cholecalciferol (DIALYVITE VITAMIN D 5000) 125 MCG (5000 UT) capsule Take 15,000 Units by mouth daily.   Cyanocobalamin (B-12 PO)  Take 1 tablet by mouth daily.   Flaxseed, Linseed, (FLAXSEED OIL) 1000 MG CAPS Take 1,000 mg by mouth daily at 8 pm.   GARLIC PO Take 7,829 mg by mouth daily.   ibuprofen (ADVIL) 200 MG tablet 2-4 tabs by mouth twice daily with meals as needed   Liniments (BLUE-EMU SUPER STRENGTH EX) Apply 1 application topically daily as needed (pain).   lisinopril (ZESTRIL) 40 MG tablet Take 1 tablet by mouth once daily   metoprolol succinate (TOPROL-XL) 50 MG 24 hr tablet Take 1 tablet (50 mg total) by mouth as needed (PALPITATIONS). Take with or immediately following a meal.   miconazole (MONISTAT 7) 2 % vaginal cream Use during treatment with augmentin if needed for vaginal yeast symptoms  use fluconazole for after treatment with Augmentin if symptoms   mometasone (NASONEX) 50 MCG/ACT nasal spray USE 2 SPRAY(S) IN EACH NOSTRIL ONCE DAILY   Multiple Vitamins-Minerals (MULTIVITAMIN WITH MINERALS) tablet Take 1 tablet by mouth daily.   nitroGLYCERIN (NITROSTAT) 0.4 MG SL tablet DISSOLVE ONE TABLET UNDER THE TONGUE EVERY 5 MINUTES AS NEEDED FOR CHEST PAIN.  DO NOT EXCEED A TOTAL OF 3 DOSES IN 15 MINUTES   rosuvastatin (CRESTOR) 5 MG tablet Take 1 tablet (5 mg total) by mouth 2 (two) times a week.   tirzepatide [facility]) 15  MG/0.5ML Pen Inject 15 mg into the skin once a week.   vitamin C (ASCORBIC ACID) 500 MG tablet Take 1,000 mg by mouth daily.   zinc gluconate 50 MG tablet Take 50 mg by mouth daily.   Allergies  Allergen Reactions   Praluent [Alirocumab]     Had significant fatigue and memory loss--was lost driving in town.  Joint pain and nausea as well.   Repatha [Evolocumab] Other (See Comments)    cramps   Rosuvastatin     Leg cramping on 5-10mg  daily   Zetia [Ezetimibe] Other (See Comments)    Joint pain and ear ringing   Atorvastatin Other (See Comments)    Pt states causes bilateral lower extremity muscle cramps   Pravastatin Nausea And Vomiting and Other (See Comments)    Causes dizziness      Review of Systems    Objective:   BP 138/82 (BP Location: Left Arm, Patient Position: Sitting, Cuff Size: Normal)   Pulse 73   Resp 18   Ht 5' 1.5" (1.562 m)   Wt 177 lb (80.3 kg)   SpO2 98%   BMI 32.90 kg/m   Physical Exam NAD Lungs:  CTA CV:  RRR without murmur or rub.  Radial and DP pulses normal and equal Abd:  S, NT, No HSM, or mass + BS.  She does have a large non-reducible supra-umbilical hernia LE:  No edema   Assessment & Plan  Lower abdominal pain:  concern she may have an intestinal component to her supraumbilical hernia now, leading to pain.  Send for CT of abdomen and pelvis.

## 2023-12-18 ENCOUNTER — Encounter: Payer: Self-pay | Admitting: Internal Medicine

## 2023-12-18 LAB — CBC WITH DIFFERENTIAL/PLATELET
Basophils Absolute: 0.1 10*3/uL (ref 0.0–0.2)
Basos: 1 %
EOS (ABSOLUTE): 0.2 10*3/uL (ref 0.0–0.4)
Eos: 3 %
Hematocrit: 40.1 % (ref 34.0–46.6)
Hemoglobin: 12.9 g/dL (ref 11.1–15.9)
Immature Grans (Abs): 0 10*3/uL (ref 0.0–0.1)
Immature Granulocytes: 0 %
Lymphocytes Absolute: 2.7 10*3/uL (ref 0.7–3.1)
Lymphs: 47 %
MCH: 30.9 pg (ref 26.6–33.0)
MCHC: 32.2 g/dL (ref 31.5–35.7)
MCV: 96 fL (ref 79–97)
Monocytes Absolute: 0.7 10*3/uL (ref 0.1–0.9)
Monocytes: 11 %
Neutrophils Absolute: 2.2 10*3/uL (ref 1.4–7.0)
Neutrophils: 38 %
Platelets: 333 10*3/uL (ref 150–450)
RBC: 4.18 x10E6/uL (ref 3.77–5.28)
RDW: 11.9 % (ref 11.7–15.4)
WBC: 5.8 10*3/uL (ref 3.4–10.8)

## 2023-12-18 LAB — COMPREHENSIVE METABOLIC PANEL
ALT: 18 [IU]/L (ref 0–32)
AST: 22 [IU]/L (ref 0–40)
Albumin: 4.1 g/dL (ref 3.9–4.9)
Alkaline Phosphatase: 57 [IU]/L (ref 44–121)
BUN/Creatinine Ratio: 12 (ref 12–28)
BUN: 12 mg/dL (ref 8–27)
Bilirubin Total: <0.2 mg/dL (ref 0.0–1.2)
CO2: 24 mmol/L (ref 20–29)
Calcium: 9.8 mg/dL (ref 8.7–10.3)
Chloride: 102 mmol/L (ref 96–106)
Creatinine, Ser: 0.98 mg/dL (ref 0.57–1.00)
Globulin, Total: 2.9 g/dL (ref 1.5–4.5)
Glucose: 90 mg/dL (ref 70–99)
Potassium: 3.9 mmol/L (ref 3.5–5.2)
Sodium: 141 mmol/L (ref 134–144)
Total Protein: 7 g/dL (ref 6.0–8.5)
eGFR: 62 mL/min/{1.73_m2} (ref >59)

## 2023-12-20 ENCOUNTER — Ambulatory Visit
Admission: RE | Admit: 2023-12-20 | Discharge: 2023-12-20 | Disposition: A | Discharge status: Home / Self Care | Payer: 59 | Source: Ambulatory Visit | Attending: Internal Medicine | Admitting: Internal Medicine

## 2023-12-20 DIAGNOSIS — R1030 Lower abdominal pain, unspecified: Secondary | ICD-10-CM

## 2024-01-03 ENCOUNTER — Other Ambulatory Visit: Payer: Self-pay | Admitting: Internal Medicine

## 2024-01-07 ENCOUNTER — Encounter: Payer: Self-pay | Admitting: Internal Medicine

## 2024-01-07 DIAGNOSIS — K5790 Diverticulosis of intestine, part unspecified, without perforation or abscess without bleeding: Secondary | ICD-10-CM | POA: Insufficient documentation

## 2024-01-14 ENCOUNTER — Encounter: Payer: Self-pay | Admitting: Pharmacist

## 2024-01-18 DIAGNOSIS — M791 Myalgia, unspecified site: Secondary | ICD-10-CM | POA: Insufficient documentation

## 2024-01-18 DIAGNOSIS — M25561 Pain in right knee: Secondary | ICD-10-CM | POA: Insufficient documentation

## 2024-01-22 LAB — LAB REPORT - SCANNED
Albumin, Urine POC: 34.5
Creatinine, POC: 82.8 mg/dL
EGFR: 61
Microalb Creat Ratio: 42

## 2024-01-24 ENCOUNTER — Encounter: Payer: Self-pay | Admitting: Nephrology

## 2024-02-05 ENCOUNTER — Other Ambulatory Visit: Payer: Self-pay

## 2024-02-11 ENCOUNTER — Other Ambulatory Visit: Payer: Self-pay | Admitting: Pharmacist

## 2024-02-11 MED ORDER — MOUNJARO 15 MG/0.5ML ~~LOC~~ SOAJ: 15.0000 mg | SUBCUTANEOUS | 11 refills | Status: AC

## 2024-03-05 ENCOUNTER — Ambulatory Visit (HOSPITAL_COMMUNITY)
Admission: EM | Admit: 2024-03-05 | Discharge: 2024-03-05 | Disposition: A | Discharge status: Home / Self Care | Attending: Sports Medicine | Admitting: Sports Medicine

## 2024-03-05 ENCOUNTER — Other Ambulatory Visit: Payer: Self-pay

## 2024-03-05 ENCOUNTER — Telehealth: Payer: Self-pay | Admitting: Internal Medicine

## 2024-03-05 ENCOUNTER — Emergency Department (HOSPITAL_COMMUNITY)
Admission: EM | Admit: 2024-03-05 | Discharge: 2024-03-05 | Disposition: A | Discharge status: Home / Self Care | Attending: Emergency Medicine | Admitting: Emergency Medicine

## 2024-03-05 ENCOUNTER — Encounter (HOSPITAL_COMMUNITY): Payer: Self-pay

## 2024-03-05 ENCOUNTER — Emergency Department (HOSPITAL_COMMUNITY)

## 2024-03-05 DIAGNOSIS — I200 Unstable angina: Secondary | ICD-10-CM

## 2024-03-05 DIAGNOSIS — R079 Chest pain, unspecified: Secondary | ICD-10-CM

## 2024-03-05 DIAGNOSIS — R0602 Shortness of breath: Secondary | ICD-10-CM | POA: Diagnosis not present

## 2024-03-05 DIAGNOSIS — R0789 Other chest pain: Secondary | ICD-10-CM | POA: Diagnosis present

## 2024-03-05 DIAGNOSIS — R002 Palpitations: Secondary | ICD-10-CM | POA: Insufficient documentation

## 2024-03-05 DIAGNOSIS — Z7982 Long term (current) use of aspirin: Secondary | ICD-10-CM | POA: Insufficient documentation

## 2024-03-05 LAB — CBC
HCT: 41.3 % (ref 36.0–46.0)
Hemoglobin: 13.2 g/dL (ref 12.0–15.0)
MCH: 31.7 pg (ref 26.0–34.0)
MCHC: 32 g/dL (ref 30.0–36.0)
MCV: 99 fL (ref 80.0–100.0)
Platelets: 299 10*3/uL (ref 150–400)
RBC: 4.17 MIL/uL (ref 3.87–5.11)
RDW: 13.1 % (ref 11.5–15.5)
WBC: 5.4 10*3/uL (ref 4.0–10.5)
nRBC: 0 % (ref 0.0–0.2)

## 2024-03-05 LAB — BASIC METABOLIC PANEL WITH GFR
Anion gap: 7 (ref 5–15)
BUN: 16 mg/dL (ref 8–23)
CO2: 22 mmol/L (ref 22–32)
Calcium: 8.7 mg/dL — ABNORMAL LOW (ref 8.9–10.3)
Chloride: 110 mmol/L (ref 98–111)
Creatinine, Ser: 1.11 mg/dL — ABNORMAL HIGH (ref 0.44–1.00)
GFR, Estimated: 53 mL/min — ABNORMAL LOW (ref >60)
Glucose, Bld: 99 mg/dL (ref 70–99)
Potassium: 3.8 mmol/L (ref 3.5–5.1)
Sodium: 139 mmol/L (ref 135–145)

## 2024-03-05 LAB — TROPONIN I (HIGH SENSITIVITY)
Troponin I (High Sensitivity): 3 ng/L (ref <18)
Troponin I (High Sensitivity): 3 ng/L (ref <18)

## 2024-03-05 MED ORDER — OMEPRAZOLE 20 MG PO CPDR: 20.0000 mg | DELAYED_RELEASE_CAPSULE | Freq: Every day | ORAL | 0 refills | Status: DC

## 2024-03-05 MED ORDER — NITROGLYCERIN 0.4 MG SL SUBL
0.4000 mg | SUBLINGUAL_TABLET | SUBLINGUAL | Status: DC | PRN
  Administered 2024-03-05: 0.4 mg via SUBLINGUAL

## 2024-03-05 MED ORDER — ASPIRIN 81 MG PO CHEW
CHEWABLE_TABLET | ORAL | Status: AC
  Filled 2024-03-05: qty 1

## 2024-03-05 MED ORDER — NITROGLYCERIN 0.4 MG SL SUBL
SUBLINGUAL_TABLET | SUBLINGUAL | Status: AC
  Filled 2024-03-05: qty 1

## 2024-03-05 MED ORDER — ASPIRIN 81 MG PO CHEW
CHEWABLE_TABLET | ORAL | Status: AC
  Filled 2024-03-05: qty 4

## 2024-03-05 MED ORDER — ASPIRIN 81 MG PO CHEW
324.0000 mg | CHEWABLE_TABLET | Freq: Once | ORAL | Status: AC
  Administered 2024-03-05: 324 mg via ORAL

## 2024-03-05 NOTE — ED Notes (Signed)
 Report given to [facility] RN at [facility]

## 2024-03-05 NOTE — ED Provider Notes (Signed)
 [facility]    CSN: [id number] Arrival date & time: 03/05/24  7829      History   Chief Complaint No chief complaint on file.   HPI Haley [name] is a 68 y.o. female with PMH of non-obstructive CAD, HTN, HLD, T2DM, and previous CVA here with chest pain.  She reports substernal pressure with exercise over the past month that resolves after short period of rest. Over the past few days has developed chest pain at rest at night while sleeping that wakes her up from sleep and lasts 10-15 minutes. It is associated with dyspnea, radiation to the left arm, palpitations, and nausea. No diaphoresis. She has not taken her morning medications but states recurrence of this pain at rest this morning. She called a nurse friend of hers who directed her here. She does follow with cardiology and rewview of her most recent visit shows she has had previous coronary studies as follows:  Cardiac Cath 05/04/20 Ost LAD to Prox LAD lesion is 30% stenosed. RPDA lesion is 50% stenosed. The left ventricular systolic function is normal. LV end diastolic pressure is mildly elevated. The left ventricular ejection fraction is 55-65% by visual estimate. 1. Nonobstructive CAD. Unchanged from 2017. 2. Normal LV function 3. Mildly elevated LVEDP 20 mm Hg   Plan: medical management.     Coronary CTA 08/14/2016: IMPRESSION: 1. Coronary calcium  score of 239. This was 52 percentile for age and sex matched control. 2. Normal coronary origin with right dominance. 3. 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 We will send additional analysis for CT FFT evaluation once available. 4. Mildly dilated pulmonary artery measuring 31 x 27 mm suspicious for pulmonary hypertension.  HPI  Past Medical History:  Diagnosis Date   Back pain    Coronary artery disease    Depression    DM type 2, controlled, with complication (HCC) 07/22/2013   Type II DM diagnosed in ~ 2006 per pt hx.    Environmental and  seasonal allergies    Ganglion cyst of wrist, right 05/09/2017   Hyperlipidemia    Hypertension    Itching    Refusal of blood transfusions as patient is Jehovah's Witness    Shingles    Stroke (HCC) 2000   per pt. report:   TIA    Patient Active Problem List   Diagnosis Date Noted   Diverticulosis 01/07/2024   Pain of pelvic girdle 04/17/2023   Cold intolerance 04/17/2023   Muscle spasms of lower extremity 04/17/2023   Left foot pain 02/17/2023   Symptomatic mammary hypertrophy 09/23/2021   Plantar fasciitis, right 07/17/2021   Abnormal ear sensation, right 07/13/2021   Chronic bilateral low back pain with right-sided sciatica 01/26/2021   Chronic right-sided thoracic back pain 01/26/2021   Coronary artery disease involving native coronary artery of native heart with angina pectoris (HCC) 05/04/2020   Diabetes mellitus without complication (HCC) 03/03/2020   Environmental and seasonal allergies 03/03/2020   Right lumbar radiculopathy 01/19/2019   Chronic pain of right knee 01/19/2019   Cutaneous abscess of abdominal wall 06/25/2018   Ganglion cyst of wrist, right 05/09/2017   Coronary artery disease involving native coronary artery of native heart without angina pectoris    Abnormal screening cardiac CT 10/16/2016   Hyperlipidemia 08/03/2016   Chest pain 08/03/2016   Onychomycosis of toenail 04/18/2016   GERD (gastroesophageal reflux disease) 02/27/2016   Depression 02/27/2016   Palpitation 03/19/2014   Dyspnea on exertion 03/19/2014   Fatigue 03/19/2014  Essential hypertension 07/22/2013   Obesity 07/22/2013   DM type 2, controlled, with complication (HCC) 07/22/2013   Allergic rhinitis 07/22/2013   Stroke (HCC) 2000    Past Surgical History:  Procedure Laterality Date   ABDOMINAL HYSTERECTOMY  1994   Laparoscopic; ovaries still intact.  Performed for prolapsed uterus.   AREOLA/NIPPLE RECONSTRUCTION WITH GRAFT Bilateral 09/19/2021   Procedure: FREE NIPPLE GRAFT;   Surgeon: Barb Bonito, MD;  Location: [facility];  Service: Plastics;  Laterality: Bilateral;   BREAST REDUCTION SURGERY Bilateral 09/19/2021   Procedure: Bilateral Breast Reduction;  Surgeon: Barb Bonito, MD;  Location: [facility];  Service: Plastics;  Laterality: Bilateral;  2 hours   CARDIAC CATHETERIZATION N/A 10/23/2016   Procedure: Left Heart Cath and Coronary Angiography;  Surgeon: Odie Benne, MD;  Location: [facility] INVASIVE CV LAB;  Service: Cardiovascular;  Laterality: N/A;   CHOLECYSTECTOMY  1997   Laparoscopic   COLONOSCOPY  2005-at age 41   Hyperplastic polyp--[place]   LEFT HEART CATH AND CORONARY ANGIOGRAPHY N/A 05/04/2020   Procedure: LEFT HEART CATH AND CORONARY ANGIOGRAPHY;  Surgeon: [place], Peter M, MD;  Location: [facility] INVASIVE CV LAB;  Service: Cardiovascular;  Laterality: N/A;   WISDOM TOOTH EXTRACTION      OB History     Gravida  2   Para  2   Term  2   Preterm      AB      Living  2      SAB      IAB      Ectopic      Multiple      Live Births               Home Medications    Prior to Admission medications   Medication Sig Start Date End Date Taking? Authorizing Provider  amLODipine  (NORVASC ) 5 MG tablet Has been taking 5mg  daily 07/30/23  Yes Ronalee Cocking, MD  Calcium  Citrate 250 MG TABS 2 tabs by mouth twice daily. 04/17/23  Yes Ronalee Cocking, MD  cetirizine  (ZYRTEC ) 10 MG tablet Take 10 mg by mouth daily. 11/25/22  Yes [provider]  Cholecalciferol (DIALYVITE VITAMIN D  5000) 125 MCG (5000 UT) capsule Take 15,000 Units by mouth daily.   Yes [provider]  Flaxseed, Linseed, (FLAXSEED OIL) 1000 MG CAPS Take 1,000 mg by mouth daily at 8 pm.   Yes [provider]  GARLIC PO Take 6,000 mg by mouth daily.   Yes [provider]  lisinopril  (ZESTRIL ) 40 MG tablet Take 1 tablet by mouth once daily 01/03/24  Yes Ronalee Cocking, MD  metoprolol  succinate (TOPROL -XL) 50 MG 24 hr tablet  Take 1 tablet (50 mg total) by mouth as needed (PALPITATIONS). Take with or immediately following a meal. 12/04/23  Yes Acharya, Gayatri A, MD  mometasone  (NASONEX ) 50 MCG/ACT nasal spray USE 2 SPRAY(S) IN EACH NOSTRIL ONCE DAILY 08/15/21  Yes Ronalee Cocking, MD  Multiple Vitamins-Minerals (MULTIVITAMIN WITH MINERALS) tablet Take 1 tablet by mouth daily.   Yes [provider]  rosuvastatin  (CRESTOR ) 5 MG tablet Take 1 tablet (5 mg total) by mouth 2 (two) times a week. 11/19/23  Yes Maccia, Melissa D, RPH-CPP  tirzepatide  (MOUNJARO ) 15 MG/0.5ML Pen Inject 15 mg into the skin once a week. 02/11/24  Yes Acharya, Gayatri A, MD  vitamin C (ASCORBIC ACID) 500 MG tablet Take 1,000 mg by mouth daily.   Yes [provider]  zinc gluconate 50 MG tablet  Take 50 mg by mouth daily.   Yes [provider]  aspirin  81 MG tablet Take 81 mg by mouth daily.    [provider]  Cyanocobalamin (B-12 PO) Take 1 tablet by mouth daily.    [provider]  ibuprofen  (ADVIL ) 200 MG tablet 2-4 tabs by mouth twice daily with meals as needed 01/25/21   Ronalee Cocking, MD  Liniments (BLUE-EMU SUPER STRENGTH EX) Apply 1 application topically daily as needed (pain).    [provider]  miconazole  (MONISTAT  7) 2 % vaginal cream Use during treatment with augmentin  if needed for vaginal yeast symptoms  use fluconazole  for after treatment with Augmentin  if symptoms 07/27/23   Ronalee Cocking, MD  nitroGLYCERIN  (NITROSTAT ) 0.4 MG SL tablet DISSOLVE ONE TABLET UNDER THE TONGUE EVERY 5 MINUTES AS NEEDED FOR CHEST PAIN.  DO NOT EXCEED A TOTAL OF 3 DOSES IN 15 MINUTES 08/27/23   Ronalee Cocking, MD  omega-3 acid ethyl esters (LOVAZA ) 1 g capsule Take 1 capsule (1 g total) by mouth 2 (two) times daily. Patient not taking: Reported on 12/17/2023 08/15/21   Ronalee Cocking, MD    Family History Family History  Problem Relation Age of Onset   Diabetes Father    Stroke  Maternal Aunt        Multiple aunts died from strokes-maternal   Hypertension Son    Diabetes Son    Hypertension Brother    Diabetes Brother    Diabetes Sister        prediabetic   Cancer Sister 48       uterine cancer   Seizures Brother     Social History Social History   Tobacco Use   Smoking status: Never    Passive exposure: Never   Smokeless tobacco: Never  Vaping Use   Vaping status: Never Used  Substance Use Topics   Alcohol use: Yes    Alcohol/week: 0.0 standard drinks of alcohol    Comment: occasionally-1 to 2 drinks monthly    Drug use: No     Allergies   Praluent  [alirocumab ], Repatha  [evolocumab ], Rosuvastatin , Zetia [ezetimibe], Atorvastatin , and Pravastatin    Review of Systems Review of Systems  Constitutional:  Negative for diaphoresis.  Respiratory:  Positive for shortness of breath. Negative for cough and wheezing.   Cardiovascular:  Positive for chest pain and palpitations. Negative for leg swelling.  Gastrointestinal:  Positive for nausea. Negative for abdominal distention and abdominal pain.     Physical Exam Triage Vital Signs ED Triage Vitals  Encounter Vitals Group     BP 03/05/24 0934 (!) 168/102     Systolic BP Percentile --      Diastolic BP Percentile --      Pulse Rate 03/05/24 0935 95     Resp 03/05/24 0934 18     Temp 03/05/24 0935 98.4 F (36.9 C)     Temp Source 03/05/24 0935 Oral     SpO2 03/05/24 0934 95 %     Weight --      Height --      Head Circumference --      Peak Flow --      Pain Score 03/05/24 0945 7     Pain Loc --      Pain Education --      Exclude from Growth Chart --    No data found.  Updated Vital Signs BP (!) 168/102 (BP Location: Left Arm)   Pulse 95   Temp 98.4 F (36.9  C) (Oral)   Resp 18   SpO2 95%    Physical Exam Constitutional:      General: She is in acute distress.     Appearance: She is obese. She is not ill-appearing or diaphoretic.  HENT:     Head: Normocephalic and  atraumatic.     Nose: Nose normal.     Mouth/Throat:     Mouth: Mucous membranes are moist.     Pharynx: Oropharynx is clear.  Eyes:     Extraocular Movements: Extraocular movements intact.     Conjunctiva/sclera: Conjunctivae normal.     Pupils: Pupils are equal, round, and reactive to light.  Neck:     Vascular: No carotid bruit.  Cardiovascular:     Rate and Rhythm: Normal rate and regular rhythm.     Pulses: Normal pulses.     Heart sounds: Normal heart sounds. No murmur heard. Pulmonary:     Effort: Pulmonary effort is normal.     Breath sounds: Normal breath sounds. No wheezing, rhonchi or rales.  Abdominal:     General: There is no distension.     Palpations: Abdomen is soft.     Tenderness: There is no abdominal tenderness. There is no guarding.  Musculoskeletal:        General: No swelling.     Cervical back: Normal range of motion and neck supple.  Skin:    General: Skin is warm.     Capillary Refill: Capillary refill takes less than 2 seconds.     Findings: No rash.  Neurological:     General: No focal deficit present.     Mental Status: She is alert and oriented to person, place, and time.  Psychiatric:        Behavior: Behavior normal.     Comments: anxious      UC Treatments / Results  Labs (all labs ordered are listed, but only abnormal results are displayed) Labs Reviewed - No data to display  EKG   Radiology No results found.  Procedures Procedures (including critical care time)  Medications Ordered in UC Medications  aspirin  chewable tablet 324 mg (has no administration in time range)  nitroGLYCERIN  (NITROSTAT ) SL tablet 0.4 mg (has no administration in time range)    Initial Impression / Assessment and Plan / UC Course  I have reviewed the triage vital signs and the nursing notes.  Pertinent labs & imaging results that were available during my care of the patient were reviewed by me and considered in my medical decision making (see  chart for details).    Unstable angina pectoris [facility]) The patient presents with symptoms suggestive of acute coronary syndrome (ACS), including chest pain with radiation at rest that resolves with nitroglycerin , radiation to left arm, dyspnea. Initial evaluation in the urgent care setting has raised significant concern for ACS, necessitating further evaluation and management in a higher level of care. Vitals reviewed on intake and she is currently hemodynamically stable. EKG on arrival reviewed and shows NSR with VR of 91bpm with no ST segments concerning for acute ischemia. She was given ASA 324mg  and a dose of sublingual nitro which resolved her chest pain. I recommended transfer to the ED for serial ECGs, Troponin, and possible cardiac stress testing vs catheterization. She is in agreement. Transported via [facility].  Final Clinical Impressions(s) / UC Diagnoses   Final diagnoses:  Unstable angina pectoris [facility])   Discharge Instructions   None    ED Prescriptions   None  PDMP not reviewed this encounter.   Marliss Simple, MD 03/05/24 1011

## 2024-03-05 NOTE — ED Provider Notes (Signed)
 [place] EMERGENCY DEPARTMENT AT [facility] Provider Note   CSN: [id number] Arrival date & time: 03/05/24  1019     History  Chief Complaint  Patient presents with   Shortness of Breath   Chest Pain   Palpitations    Haley [name] is a 68 y.o. female.   Shortness of Breath Associated symptoms: chest pain   Chest Pain Associated symptoms: palpitations and shortness of breath   Palpitations Associated symptoms: chest pain and shortness of breath   Patient presents with shortness of breath chest pain.  Has had episodes of this over the last month but also for longer than that.  Has had previous cardiac workup with no clear cause.  Has had reassuring heart cath with some disease but nonobstructive.  Tends to be worse at night.  Has been coming more frequently.  Worse when she is laying down.  Also has some episodes with walking.  States she can walk but if she tries to exert herself she gets pain.  This has been going for a while now.  Has been evaluated for this previously.  May be a little worse than before.     Home Medications Prior to Admission medications   Medication Sig Start Date End Date Taking? Authorizing Provider  omeprazole (PRILOSEC) 20 MG capsule Take 1 capsule (20 mg total) by mouth daily. 03/05/24  Yes Mozell Arias, MD  amLODipine  (NORVASC ) 5 MG tablet Has been taking 5mg  daily 07/30/23   Ronalee Cocking, MD  aspirin  81 MG tablet Take 81 mg by mouth daily.    [provider]  Calcium  Citrate 250 MG TABS 2 tabs by mouth twice daily. 04/17/23   Ronalee Cocking, MD  cetirizine  (ZYRTEC ) 10 MG tablet Take 10 mg by mouth daily. 11/25/22   [provider]  Cholecalciferol (DIALYVITE VITAMIN D  5000) 125 MCG (5000 UT) capsule Take 15,000 Units by mouth daily.    [provider]  Cyanocobalamin (B-12 PO) Take 1 tablet by mouth daily.    [provider]  Flaxseed, Linseed, (FLAXSEED OIL) 1000 MG CAPS Take 1,000  mg by mouth daily at 8 pm.    [provider]  GARLIC PO Take 6,000 mg by mouth daily.    [provider]  ibuprofen  (ADVIL ) 200 MG tablet 2-4 tabs by mouth twice daily with meals as needed 01/25/21   Ronalee Cocking, MD  Liniments (BLUE-EMU SUPER STRENGTH EX) Apply 1 application topically daily as needed (pain).    [provider]  lisinopril  (ZESTRIL ) 40 MG tablet Take 1 tablet by mouth once daily 01/03/24   Ronalee Cocking, MD  metoprolol  succinate (TOPROL -XL) 50 MG 24 hr tablet Take 1 tablet (50 mg total) by mouth as needed (PALPITATIONS). Take with or immediately following a meal. 12/04/23   Euell Herrlich, MD  miconazole  (MONISTAT  7) 2 % vaginal cream Use during treatment with augmentin  if needed for vaginal yeast symptoms  use fluconazole  for after treatment with Augmentin  if symptoms 07/27/23   Ronalee Cocking, MD  mometasone  (NASONEX ) 50 MCG/ACT nasal spray USE 2 SPRAY(S) IN EACH NOSTRIL ONCE DAILY 08/15/21   Ronalee Cocking, MD  Multiple Vitamins-Minerals (MULTIVITAMIN WITH MINERALS) tablet Take 1 tablet by mouth daily.    [provider]  nitroGLYCERIN  (NITROSTAT ) 0.4 MG SL tablet DISSOLVE ONE TABLET UNDER THE TONGUE EVERY 5 MINUTES AS NEEDED FOR CHEST PAIN.  DO NOT EXCEED A TOTAL OF 3 DOSES IN 15 MINUTES 08/27/23   Ronalee Cocking, MD  omega-3 acid ethyl esters (LOVAZA ) 1 g capsule Take 1 capsule (1 g total) by mouth 2 (two) times daily. Patient not taking: Reported on 12/17/2023 08/15/21   Ronalee Cocking, MD  rosuvastatin  (CRESTOR ) 5 MG tablet Take 1 tablet (5 mg total) by mouth 2 (two) times a week. 11/19/23   Maccia, Melissa D, RPH-CPP  tirzepatide  (MOUNJARO ) 15 MG/0.5ML Pen Inject 15 mg into the skin once a week. 02/11/24   Acharya, Gayatri A, MD  vitamin C (ASCORBIC ACID) 500 MG tablet Take 1,000 mg by mouth daily.    [provider]  zinc gluconate 50 MG tablet Take 50 mg by mouth daily.    [provider]       Allergies    Praluent  [alirocumab ], Repatha  [evolocumab ], Rosuvastatin , Zetia [ezetimibe], Atorvastatin , and Pravastatin     Review of Systems   Review of Systems  Respiratory:  Positive for shortness of breath.   Cardiovascular:  Positive for chest pain and palpitations.    Physical Exam Updated Vital Signs BP (!) 163/90   Pulse 72   Temp 97.9 F (36.6 C) (Oral)   Resp 16   Ht 5' 1.5" (1.562 m)   Wt 79.4 kg   SpO2 100%   BMI 32.53 kg/m  Physical Exam Vitals and nursing note reviewed.  Cardiovascular:     Rate and Rhythm: Regular rhythm.  Pulmonary:     Breath sounds: No decreased breath sounds or wheezing.  Chest:     Chest wall: No tenderness.  Abdominal:     Tenderness: There is no abdominal tenderness.  Skin:    Capillary Refill: Capillary refill takes less than 2 seconds.  Neurological:     Mental Status: She is alert and oriented to person, place, and time.     ED Results / Procedures / Treatments   Labs (all labs ordered are listed, but only abnormal results are displayed) Labs Reviewed  BASIC METABOLIC PANEL WITH GFR - Abnormal; Notable for the following components:      Result Value   Creatinine, Ser 1.11 (*)    Calcium  8.7 (*)    GFR, Estimated 53 (*)    All other components within normal limits  CBC  TROPONIN I (HIGH SENSITIVITY)  TROPONIN I (HIGH SENSITIVITY)    EKG EKG Interpretation Date/Time:  Wednesday March 05 2024 10:24:51 EDT Ventricular Rate:  78 PR Interval:  168 QRS Duration:  91 QT Interval:  349 QTC Calculation: 398 R Axis:   -30  Text Interpretation: Sinus rhythm Left axis deviation Low voltage, precordial leads Consider anterior infarct No significant change since last tracing Confirmed by Mozell Arias [phone]) on 03/05/2024 10:27:01 AM  Radiology DG Chest Portable 1 View Result Date: 03/05/2024 CLINICAL DATA:  Chest pain. EXAM: PORTABLE CHEST 1 VIEW COMPARISON:  Chest radiograph dated 10/29/2022. FINDINGS: Shallow  inspiration. No focal consolidation, pleural effusion, or pneumothorax. The cardiac silhouette is within normal limits. Atherosclerotic calcification of the aortic arch. No acute osseous pathology. IMPRESSION: No active disease. Electronically Signed   By: Angus Bark M.D.   On: 03/05/2024 11:10    Procedures Procedures    Medications Ordered in ED Medications - No data to display  ED Course/ Medical Decision Making/ A&P                                 Medical Decision Making Amount and/or Complexity of Data Reviewed Labs: ordered. Radiology: ordered.  Risk  Prescription drug management.   Patient with chest pain.  Anterior chest.  Does have some worry some and nonworrisome components.  Does come on at night while laying down.  Worse after eating.  Also does have episodes reportedly with exercise.  However this has been stable for a while.  EKG reassuring.  Reviewed recent cardiology note.  Reviewed previous cath report.  Will get troponins and x-ray.  Workup reassuring.  Troponin is negative x 2.  X-ray does not show clear cause.  The fact that is worse after eating and laying down does raise possibility of a GI cause.  Will start Prilosec.  However with some exertional component I think would benefit in follow-up with cardiology.  Has had previous workup for similar symptoms do not think we need admission to the hospital.  Will discharge home.          Final Clinical Impression(s) / ED Diagnoses Final diagnoses:  Nonspecific chest pain    Rx / DC Orders ED Discharge Orders          Ordered    omeprazole (PRILOSEC) 20 MG capsule  Daily        03/05/24 1404    Ambulatory referral to Cardiology       Comments: If you have not heard from the Cardiology office within the next 72 hours please call [phone].   03/05/24 1404              Mozell Arias, MD 03/05/24 1540

## 2024-03-05 NOTE — ED Notes (Signed)
 EDP at bedside

## 2024-03-05 NOTE — ED Notes (Signed)
 Pt ambulated to restroom without assistance.

## 2024-03-05 NOTE — ED Triage Notes (Signed)
 Pt. Stated, I started having chet pain with SOB and feeling my heart pound. It really started about a month ago.I was sent down here from UC for further evaluation

## 2024-03-05 NOTE — ED Triage Notes (Signed)
 Chest pain, SOB started last night. Patient reports left arm pain that started this morning. Patient states the chest pain is radiating down to her stomach and back. Patient states she has not taking any medication this morning.  Pain 7/10

## 2024-03-05 NOTE — Telephone Encounter (Signed)
 Pt c/o of Chest Pain: STAT if active (IN THIS MOMENT) CP, including tightness, pressure, jaw pain, shoulder/upper arm/back pain, SOB, nausea, and vomiting.  1. Are you having CP right now (tightness, pressure, or discomfort)? Tightness, (left arm hurts), heart palpation and stomach pain   2. Are you experiencing any other symptoms (ex. SOB, nausea, vomiting, sweating)? sob  3. How long have you been experiencing CP? A month but have gotten worse  4. Is your CP continuous or coming and going? Coming and going   5. Have you taken Nitroglycerin ? no  6. If CP returns before callback, please consider calling 911. ?

## 2024-03-05 NOTE — ED Notes (Signed)
 Patient is being discharged from the Urgent Care and sent to the Emergency Department via [facility] . Per Dr. Curriero, patient is in need of higher level of care due to chest pain/SOB. Patient is aware and verbalizes understanding of plan of care.  Vitals:   03/05/24 0934 03/05/24 0935  BP: (!) 168/102   Pulse:  95  Resp: 18   Temp:  98.4 F (36.9 C)  SpO2: 95%

## 2024-03-05 NOTE — Telephone Encounter (Signed)
 Spoke with pt regarding her chest pain. Pt stated she is currently experiencing chest pain that is radiating down her arm as well as into her stomach. Pt stated she is also experiencing sob. Pt was advised to go to the ED for evaluation. Pt verbalized understanding. All questions if any were answered.

## 2024-03-06 NOTE — Progress Notes (Signed)
 Cardiology Office Note:    Date:  03/07/2024   ID:  Haley [name], DOB [birth year], MRN [id number]  PCP:  Ronalee Cocking, MD   [facility] Providers Cardiologist:  Euell Herrlich, MD     Referring MD: Mozell Arias, MD   Chief Complaint  Patient presents with   Follow-up    Chest pain    History of Present Illness:    Haley [name] is a 68 y.o. female with a hx of nonobstructive CAD, hypertension, hyperlipidemia, DM2, and prior CVA.  She has been intolerant to statins, Repatha , and Nexletol  secondary to muscle cramps.  Coronary CTA in 2017 showed a calcium  score of 239 placing her at the 93rd percentile.  She had moderate diffuse CAD.  Cardiac catheterization 10/2016 showed mild nonobstructive disease in the LAD, moderate stenosis in the mid to distal PDA.  LV function was normal.  Medical therapy was recommended.  Repeat heart catheterization 05/04/2020 showed nonobstructive CAD similar to prior study.  She was seen in the ER 10/25/2022 with palpitations and dyspnea.  ER workup was reassuring.  She had a negative Myoview  and echocardiogram that showed preserved LVEF 55 to 60%, no WMA, no significant valvular disease.  At last visit in January 2025 she had lost 50 pounds with Zepbound .  She presented to the ER/30/25 with chest pain concerning for angina.  She ruled out with negative enzymes and a nonischemic EKG.  She presents today for cardiology follow-up. She had another episode of chest pain that woke her from sleep last night, accompanied by palpitations and radiation down her left arm. She is not sure what occurs first when she is sleeping, CP wakes her up but she also notices palpitations and SOB. CP episodes occur weekly.   She self-discontinued amlodipine  1 month ago due to gas. CP episodes have occurring for 3 months. She takes NTG for CP 3 times per week and this slows the chest pain but doesn't help the palpitations. Palpitations last 10-15 min. She  takes 50 mg toprol  every morning.   Chest pain has gotten worse in the last month, question if this is related to stopping amlodipine .  Hypertensive today, but controlled by readings at home on her log.     She also reports CP changes with position.     Past Medical History:  Diagnosis Date   Back pain    Coronary artery disease    Depression    DM type 2, controlled, with complication (HCC) 07/22/2013   Type II DM diagnosed in ~ 2006 per pt hx.    Environmental and seasonal allergies    Ganglion cyst of wrist, right 05/09/2017   Hyperlipidemia    Hypertension    Itching    Refusal of blood transfusions as patient is Jehovah's Witness    Shingles    Stroke (HCC) 2000   per pt. report:   TIA    Past Surgical History:  Procedure Laterality Date   ABDOMINAL HYSTERECTOMY  1994   Laparoscopic; ovaries still intact.  Performed for prolapsed uterus.   AREOLA/NIPPLE RECONSTRUCTION WITH GRAFT Bilateral 09/19/2021   Procedure: FREE NIPPLE GRAFT;  Surgeon: Barb Bonito, MD;  Location: [facility] OR;  Service: Plastics;  Laterality: Bilateral;   BREAST REDUCTION SURGERY Bilateral 09/19/2021   Procedure: Bilateral Breast Reduction;  Surgeon: Barb Bonito, MD;  Location: [facility] OR;  Service: Plastics;  Laterality: Bilateral;  2 hours   CARDIAC CATHETERIZATION N/A 10/23/2016   Procedure: Left Heart Cath and  Coronary Angiography;  Surgeon: Odie Benne, MD;  Location: [facility] INVASIVE CV LAB;  Service: Cardiovascular;  Laterality: N/A;   CHOLECYSTECTOMY  1997   Laparoscopic   COLONOSCOPY  2005-at age 30   Hyperplastic polyp--[place]   LEFT HEART CATH AND CORONARY ANGIOGRAPHY N/A 05/04/2020   Procedure: LEFT HEART CATH AND CORONARY ANGIOGRAPHY;  Surgeon: Swazil[place], Peter M, MD;  Location: [facility] INVASIVE CV LAB;  Service: Cardiovascular;  Laterality: N/A;   WISDOM TOOTH EXTRACTION      Current Medications: Current Meds  Medication Sig   aspirin  81 MG tablet Take 81 mg by mouth daily.    Calcium  Citrate 250 MG TABS 2 tabs by mouth twice daily.   cetirizine  (ZYRTEC ) 10 MG tablet Take 10 mg by mouth daily.   Cholecalciferol (DIALYVITE VITAMIN D  5000) 125 MCG (5000 UT) capsule Take 15,000 Units by mouth daily.   Cyanocobalamin (B-12 PO) Take 1 tablet by mouth daily.   Flaxseed, Linseed, (FLAXSEED OIL) 1000 MG CAPS Take 1,000 mg by mouth daily at 8 pm.   GARLIC PO Take 6,000 mg by mouth daily.   ibuprofen  (ADVIL ) 200 MG tablet 2-4 tabs by mouth twice daily with meals as needed   isosorbide  mononitrate (IMDUR ) 30 MG 24 hr tablet Take 1 tablet (30 mg total) by mouth daily.   Liniments (BLUE-EMU SUPER STRENGTH EX) Apply 1 application topically daily as needed (pain).   lisinopril  (ZESTRIL ) 40 MG tablet Take 1 tablet by mouth once daily   metoprolol  succinate (TOPROL -XL) 50 MG 24 hr tablet Take 1 tablet (50 mg total) by mouth as needed (PALPITATIONS). Take with or immediately following a meal.   mometasone  (NASONEX ) 50 MCG/ACT nasal spray USE 2 SPRAY(S) IN EACH NOSTRIL ONCE DAILY   Multiple Vitamins-Minerals (MULTIVITAMIN WITH MINERALS) tablet Take 1 tablet by mouth daily.   nitroGLYCERIN  (NITROSTAT ) 0.4 MG SL tablet DISSOLVE ONE TABLET UNDER THE TONGUE EVERY 5 MINUTES AS NEEDED FOR CHEST PAIN.  DO NOT EXCEED A TOTAL OF 3 DOSES IN 15 MINUTES   omega-3 acid ethyl esters (LOVAZA ) 1 g capsule Take 1 capsule (1 g total) by mouth 2 (two) times daily.   omeprazole  (PRILOSEC) 20 MG capsule Take 1 capsule (20 mg total) by mouth daily.   rosuvastatin  (CRESTOR ) 5 MG tablet Take 1 tablet (5 mg total) by mouth 2 (two) times a week.   tirzepatide  (MOUNJARO ) 15 MG/0.5ML Pen Inject 15 mg into the skin once a week.   vitamin C (ASCORBIC ACID) 500 MG tablet Take 1,000 mg by mouth daily.   zinc gluconate 50 MG tablet Take 50 mg by mouth daily.     Allergies:   Praluent  [alirocumab ], Repatha  [evolocumab ], Rosuvastatin , Zetia [ezetimibe], Atorvastatin , and Pravastatin    Social History   Socioeconomic  History   Marital status: Single    Spouse name: Not on file   Number of children: 2   Years of education: 12+   Highest education level: Some college, no degree  Occupational History   Occupation: Print production planner    Comment: Retired now  Tobacco Use   Smoking status: Never    Passive exposure: Never   Smokeless tobacco: Never  Vaping Use   Vaping status: Never Used  Substance and Sexual Activity   Alcohol use: Yes    Alcohol/week: 0.0 standard drinks of alcohol    Comment: occasionally-1 to 2 drinks monthly    Drug use: No   Sexual activity: Not Currently    Birth control/protection: Surgical  Other Topics  Concern   Not on file  Social History Narrative   Did get some post high school training/education   Lives alone   [place] 2 great grandchildren frequently   Daughter lives in [place] and checks in regularly.   Social Drivers of Corporate investment banker Strain: Low Risk  (04/18/2023)   Overall Financial Resource Strain (CARDIA)    Difficulty of Paying Living Expenses: Not hard at all  Food Insecurity: No Food Insecurity (04/18/2023)   Hunger Vital Sign    Worried About Running Out of Food in the Last Year: Never true    Ran Out of Food in the Last Year: Never true  Transportation Needs: No Transportation Needs (04/18/2023)   PRAPARE - Administrator, Civil Service (Medical): No    Lack of Transportation (Non-Medical): No  Physical Activity: Unknown (08/15/2021)   Received from [facility], [facility]   Exercise Vital Sign    Days of Exercise per Week: Patient declined    Minutes of Exercise per Session: Not on file  Stress: Unknown (08/15/2021)   Received from [facility], [facility] of Occupational [facility] - Occupational Stress Questionnaire    Feeling of Stress : Patient declined  Social Connections: Unknown (03/17/2022)   Received from [facility], [facility]   Social Network    Social Network: Not on file      Family History: The patient's family history includes Cancer (age of onset: 21) in her sister; Diabetes in her brother, father, sister, and son; Hypertension in her brother and son; Seizures in her brother; Stroke in her maternal aunt.  ROS:   Please see the history of present illness.     All other systems reviewed and are negative.  EKGs/Labs/Other Studies Reviewed:    The following studies were reviewed today:       Recent Labs: 04/17/2023: TSH 2.410 12/17/2023: ALT 18 03/05/2024: BUN 16; Creatinine, Ser 1.11; Hemoglobin 13.2; Platelets 299; Potassium 3.8; Sodium 139  Recent Lipid Panel    Component Value Date/Time   CHOL 189 11/16/2023 1045   TRIG 50 11/16/2023 1045   HDL 52 11/16/2023 1045   CHOLHDL 3.6 11/16/2023 1045   LDLCALC 127 (H) 11/16/2023 1045   LDLDIRECT 134 (H) 07/22/2013 1620     Risk Assessment/Calculations:           Physical Exam:    VS:  BP (!) 150/80   Pulse 70   Ht 5\' 1"  (1.549 m)   Wt 174 lb 6.4 oz (79.1 kg)   SpO2 96%   BMI 32.95 kg/m     Wt Readings from Last 3 Encounters:  03/07/24 174 lb 6.4 oz (79.1 kg)  03/05/24 175 lb (79.4 kg)  12/17/23 177 lb (80.3 kg)     GEN:  Well nourished, well developed in no acute distress HEENT: Normal NECK: No JVD; No carotid bruits LYMPHATICS: No lymphadenopathy CARDIAC: RRR, no murmurs, rubs, gallops RESPIRATORY:  Clear to auscultation without rales, wheezing or rhonchi  ABDOMEN: Soft, non-tender, non-distended MUSCULOSKELETAL:  No edema; No deformity  SKIN: Warm and dry NEUROLOGIC:  Alert and oriented x 3 PSYCHIATRIC:  Normal affect   ASSESSMENT:    1. Chest discomfort   2. Coronary artery disease involving native coronary artery of native heart without angina pectoris   3. Essential hypertension   4. Medication management   5. Hyperlipidemia with target LDL less than 70   6. Palpitations   7. SOB (shortness of  breath)   8. Statin intolerance   9. DM type 2, controlled, with  complication (HCC)    PLAN:    In order of problems listed above:  Chest pain - wakes her from sleep, associated with SOB and palpitations, changes in intensity when she changes position in bed - sounds atypical - partially responsive to NTG - nonobstructive CAD by heart cath x 2, reassuring myoview  11/2022 - did not tolerate amlodipine  due to gas - will order PET stress test - start 15 mg imdur  at night x 7 days then increase to 30 mg, increase toprol  to 75 mg daily for palpitations and anti-anginal effect, and reduce lisinopril  to 20 mg as BP at home is running in the 100 range systolic -We discussed that we can make the above medication changes now and order a PET stress test, if her symptoms drastically improved with these medication changes we can hold off on the PET stress test - I will have her follow-up in 3 to 4 weeks   Nonobstructive CAD - LHC 2017, 2021 with stable nonobstructive disease - Myoview  2024 negative for ischemia -- continue risk factor management   Hypertension - Controlled on 40 mg lisinopril , 50 mg Toprol  -- will continue to monitor at home, hypertensive today because she drove around for an hour looking for our new building -- SBP at home running in the 100-110 range -- To allow us  to increase antianginals, I will reduce lisinopril  to 20 mg, increase Toprol  to 75 mg, and start 50 mg Imdur  x 7 days then increase to 30 mg Imdur  at night -She will continue to monitor blood pressure at home   Hyperlipidemia with LDL goal less than 70 - Continue 5 mg Crestor  - Has not been tolerant to increased doses of crestor , or Repatha  and Nexletol  11/16/2023: Cholesterol, Total 189; HDL 52; LDL Chol Calc (NIH) 127; Triglycerides 50   DM2 - Last A1c 5.6% - Has done well on Zepbound    Close follow up with med changes. Question microvascular disease. Given difficult to control HLD, will order PET stress test, she is not a treadmill candidate.       Informed Consent    Shared Decision Making/Informed Consent The risks [chest pain, shortness of breath, cardiac arrhythmias, dizziness, blood pressure fluctuations, myocardial infarction, stroke/transient ischemic attack, nausea, vomiting, allergic reaction, radiation exposure, metallic taste sensation and life-threatening complications (estimated to be 1 in 10,000)], benefits (risk stratification, diagnosing coronary artery disease, treatment guidance) and alternatives of a cardiac PET stress test were discussed in detail with Ms. Hajjar and she agrees to proceed.       Medication Adjustments/Labs and Tests Ordered: Current medicines are reviewed at length with the patient today.  Concerns regarding medicines are outlined above.  Orders Placed This Encounter  Procedures   NM PET CT CARDIAC PERFUSION MULTI W/ABSOLUTE BLOODFLOW   Cardiac Stress Test: Informed Consent Details: Physician/Practitioner Attestation; Transcribe to consent form and obtain patient signature   Meds ordered this encounter  Medications   isosorbide  mononitrate (IMDUR ) 30 MG 24 hr tablet    Sig: Take 1 tablet (30 mg total) by mouth daily.    Dispense:  90 tablet    Refill:  3    Patient Instructions  Medication Instructions:  Reduce Lisinopril  to 20 mg (1/2 tablet) once a day Increase Toprol  yo 75 mg (1 1/2 tablet) once a day  Start Imdur  30 mg once a day *If you need a refill on your cardiac medications before your  next appointment, please call your pharmacy*  Lab Work: No labs If you have labs (blood work) drawn today and your tests are completely normal, you will receive your results only by: MyChart Message (if you have MyChart) OR A paper copy in the mail If you have any lab test that is abnormal or we need to change your treatment, we will call you to review the results.  Testing/Procedures:    Please report to Radiology at the [facility] Main Entrance 30 minutes early for your test.  [address]  [place], [place] [address]  How to Prepare for Your Cardiac PET/CT Stress Test:  Nothing to eat or drink, except water, 3 hours prior to arrival time.  NO caffeine/decaffeinated products, or chocolate 12 hours prior to arrival. (Please note decaffeinated beverages (teas/coffees) still contain caffeine).  If you have caffeine within 12 hours prior, the test will need to be rescheduled.  Medication instructions: Do not take erectile dysfunction medications for 72 hours prior to test (sildenafil, tadalafil) Do not take nitrates (isosorbide  mononitrate, Ranexa ) the day before or day of test Do not take tamsulosin the day before or morning of test Hold theophylline containing medications for 12 hours. Hold Dipyridamole 48 hours prior to the test.  Diabetic Preparation: If able to eat breakfast prior to 3 hour fasting, you may take all medications, including your insulin. Do not worry if you miss your breakfast dose of insulin - start at your next meal. If you do not eat prior to 3 hour fast-Hold all diabetes (oral and insulin) medications. Patients who wear a continuous glucose monitor MUST remove the device prior to scanning.  You may take your remaining medications with water.  NO perfume, cologne or lotion on chest or abdomen area. FEMALES - Please avoid wearing dresses to this appointment.  Total time is 1 to 2 hours; you may want to bring reading material for the waiting time.  IF YOU THINK YOU MAY BE PREGNANT, OR ARE NURSING PLEASE INFORM THE TECHNOLOGIST.  In preparation for your appointment, medication and supplies will be purchased.  Appointment availability is limited, so if you need to cancel or reschedule, please call the Radiology Department Scheduler at [phone] 24 hours in advance to avoid a cancellation fee of $100.00  What to Expect When you Arrive:  Once you arrive and check in for your appointment, you will be taken to a preparation room within the Radiology  Department.  A technologist or Nurse will obtain your medical history, verify that you are correctly prepped for the exam, and explain the procedure.  Afterwards, an IV will be started in your arm and electrodes will be placed on your skin for EKG monitoring during the stress portion of the exam. Then you will be escorted to the PET/CT scanner.  There, staff will get you positioned on the scanner and obtain a blood pressure and EKG.  During the exam, you will continue to be connected to the EKG and blood pressure machines.  A small, safe amount of a radioactive tracer will be injected in your IV to obtain a series of pictures of your heart along with an injection of a stress agent.    After your Exam:  It is recommended that you eat a meal and drink a caffeinated beverage to counter act any effects of the stress agent.  Drink plenty of fluids for the remainder of the day and urinate frequently for the first couple of hours after the exam.  Your doctor will inform you of your test results within 7-10 business days.  For more information and frequently asked questions, please visit our website: [email or web address]  For questions about your test or how to prepare for your test, please call: Cardiac Imaging Nurse Navigators Office: [phone]  Follow-Up: At [facility], you and your health needs are our priority.  As part of our continuing mission to provide you with exceptional heart care, our providers are all part of one team.  This team includes your primary Cardiologist (physician) and Advanced Practice Providers or APPs (Physician Assistants and Nurse Practitioners) who all work together to provide you with the care you need, when you need it.  Your next appointment:   scheduled  We recommend signing up for the patient portal called "MyChart".  Sign up information is provided on this After Visit Summary.  MyChart is used to connect with patients for Virtual Visits  (Telemedicine).  Patients are able to view lab/test results, encounter notes, upcoming appointments, etc.  Non-urgent messages can be sent to your provider as well.   To learn more about what you can do with MyChart, go to [email or web address].      Signed, Lamond Pilot, [place]  03/07/2024 10:45 AM    [facility]

## 2024-03-07 ENCOUNTER — Telehealth: Payer: Self-pay

## 2024-03-07 ENCOUNTER — Ambulatory Visit: Attending: Physician Assistant | Admitting: Physician Assistant

## 2024-03-07 ENCOUNTER — Encounter: Payer: Self-pay | Admitting: Physician Assistant

## 2024-03-07 VITALS — BP 150/80 | HR 70 | Ht 61.0 in | Wt 174.4 lb

## 2024-03-07 DIAGNOSIS — R0602 Shortness of breath: Secondary | ICD-10-CM

## 2024-03-07 DIAGNOSIS — I10 Essential (primary) hypertension: Secondary | ICD-10-CM

## 2024-03-07 DIAGNOSIS — R0789 Other chest pain: Secondary | ICD-10-CM | POA: Diagnosis not present

## 2024-03-07 DIAGNOSIS — I2510 Atherosclerotic heart disease of native coronary artery without angina pectoris: Secondary | ICD-10-CM | POA: Diagnosis not present

## 2024-03-07 DIAGNOSIS — E785 Hyperlipidemia, unspecified: Secondary | ICD-10-CM

## 2024-03-07 DIAGNOSIS — Z79899 Other long term (current) drug therapy: Secondary | ICD-10-CM

## 2024-03-07 DIAGNOSIS — E118 Type 2 diabetes mellitus with unspecified complications: Secondary | ICD-10-CM

## 2024-03-07 DIAGNOSIS — R002 Palpitations: Secondary | ICD-10-CM

## 2024-03-07 DIAGNOSIS — Z789 Other specified health status: Secondary | ICD-10-CM

## 2024-03-07 MED ORDER — ISOSORBIDE MONONITRATE ER 30 MG PO TB24: 30.0000 mg | ORAL_TABLET | Freq: Every day | ORAL | 3 refills | Status: DC

## 2024-03-07 NOTE — Patient Instructions (Signed)
 Medication Instructions:  Reduce Lisinopril  to 20 mg (1/2 tablet) once a day Increase Toprol  yo 75 mg (1 1/2 tablet) once a day  Start Imdur  30 mg once a day *If you need a refill on your cardiac medications before your next appointment, please call your pharmacy*  Lab Work: No labs If you have labs (blood work) drawn today and your tests are completely normal, you will receive your results only by: MyChart Message (if you have MyChart) OR A paper copy in the mail If you have any lab test that is abnormal or we need to change your treatment, we will call you to review the results.  Testing/Procedures:    Please report to Radiology at the [facility] Main Entrance 30 minutes early for your test.  [address] [place], [place] [address]  How to Prepare for Your Cardiac PET/CT Stress Test:  Nothing to eat or drink, except water, 3 hours prior to arrival time.  NO caffeine/decaffeinated products, or chocolate 12 hours prior to arrival. (Please note decaffeinated beverages (teas/coffees) still contain caffeine).  If you have caffeine within 12 hours prior, the test will need to be rescheduled.  Medication instructions: Do not take erectile dysfunction medications for 72 hours prior to test (sildenafil, tadalafil) Do not take nitrates (isosorbide  mononitrate, Ranexa ) the day before or day of test Do not take tamsulosin the day before or morning of test Hold theophylline containing medications for 12 hours. Hold Dipyridamole 48 hours prior to the test.  Diabetic Preparation: If able to eat breakfast prior to 3 hour fasting, you may take all medications, including your insulin. Do not worry if you miss your breakfast dose of insulin - start at your next meal. If you do not eat prior to 3 hour fast-Hold all diabetes (oral and insulin) medications. Patients who wear a continuous glucose monitor MUST remove the device prior to scanning.  You may take your remaining medications  with water.  NO perfume, cologne or lotion on chest or abdomen area. FEMALES - Please avoid wearing dresses to this appointment.  Total time is 1 to 2 hours; you may want to bring reading material for the waiting time.  IF YOU THINK YOU MAY BE PREGNANT, OR ARE NURSING PLEASE INFORM THE TECHNOLOGIST.  In preparation for your appointment, medication and supplies will be purchased.  Appointment availability is limited, so if you need to cancel or reschedule, please call the Radiology Department Scheduler at [phone] 24 hours in advance to avoid a cancellation fee of $100.00  What to Expect When you Arrive:  Once you arrive and check in for your appointment, you will be taken to a preparation room within the Radiology Department.  A technologist or Nurse will obtain your medical history, verify that you are correctly prepped for the exam, and explain the procedure.  Afterwards, an IV will be started in your arm and electrodes will be placed on your skin for EKG monitoring during the stress portion of the exam. Then you will be escorted to the PET/CT scanner.  There, staff will get you positioned on the scanner and obtain a blood pressure and EKG.  During the exam, you will continue to be connected to the EKG and blood pressure machines.  A small, safe amount of a radioactive tracer will be injected in your IV to obtain a series of pictures of your heart along with an injection of a stress agent.    After your Exam:  It is recommended  that you eat a meal and drink a caffeinated beverage to counter act any effects of the stress agent.  Drink plenty of fluids for the remainder of the day and urinate frequently for the first couple of hours after the exam.  Your doctor will inform you of your test results within 7-10 business days.  For more information and frequently asked questions, please visit our website: [email or web address]  For questions about your test or how to prepare for  your test, please call: Cardiac Imaging Nurse Navigators Office: [phone]  Follow-Up: At [facility], you and your health needs are our priority.  As part of our continuing mission to provide you with exceptional heart care, our providers are all part of one team.  This team includes your primary Cardiologist (physician) and Advanced Practice Providers or APPs (Physician Assistants and Nurse Practitioners) who all work together to provide you with the care you need, when you need it.  Your next appointment:   scheduled  We recommend signing up for the patient portal called "MyChart".  Sign up information is provided on this After Visit Summary.  MyChart is used to connect with patients for Virtual Visits (Telemedicine).  Patients are able to view lab/test results, encounter notes, upcoming appointments, etc.  Non-urgent messages can be sent to your provider as well.   To learn more about what you can do with MyChart, go to [email or web address].

## 2024-03-07 NOTE — Telephone Encounter (Signed)
 Patient needs a after ED appointment .  We will call patient if there is a cancellation.

## 2024-03-19 NOTE — Telephone Encounter (Signed)
 Patient has been scheduled for 03-20-24

## 2024-03-19 NOTE — Telephone Encounter (Signed)
 Patient has been scheduled for 03/05/24.

## 2024-03-20 ENCOUNTER — Encounter: Payer: Self-pay | Admitting: Internal Medicine

## 2024-03-20 ENCOUNTER — Telehealth: Payer: Self-pay

## 2024-03-20 ENCOUNTER — Ambulatory Visit (INDEPENDENT_AMBULATORY_CARE_PROVIDER_SITE_OTHER): Admitting: Internal Medicine

## 2024-03-20 VITALS — BP 138/84 | HR 82 | Resp 16 | Ht 61.5 in | Wt 168.0 lb

## 2024-03-20 DIAGNOSIS — R252 Cramp and spasm: Secondary | ICD-10-CM

## 2024-03-20 DIAGNOSIS — K5900 Constipation, unspecified: Secondary | ICD-10-CM

## 2024-03-20 DIAGNOSIS — R079 Chest pain, unspecified: Secondary | ICD-10-CM | POA: Diagnosis not present

## 2024-03-20 DIAGNOSIS — K625 Hemorrhage of anus and rectum: Secondary | ICD-10-CM

## 2024-03-20 DIAGNOSIS — I83813 Varicose veins of bilateral lower extremities with pain: Secondary | ICD-10-CM

## 2024-03-20 MED ORDER — METAMUCIL 3 IN 1 DAILY FIBER 400 MG PO CAPS: ORAL_CAPSULE | ORAL | Status: DC

## 2024-03-20 NOTE — Progress Notes (Signed)
 Subjective:    Patient ID: Haley [name], female   DOB: [birth year], 68 y.o.   MRN: [id number]   HPI   Increasing frequency of chest pain for which she was seen in ED on 03/05/24 and followed up with cardiology on 03/07/2024.  See Cardiology note.  She is being scheduled for nuclear medicine PET perfusion study, but has not heard anything yet from [facility] where this is to take place.  Since starting Isosorbide , she is having less severe chest discomfort, though continues to occur every other day.  Admits after 2 days of bad HA with Imdur  waking her at 2 am and lasting until about 2 hours before next dose, she cut the dose in half.  Has not tried taking Tylenol  with it.   She was given Omeprazole  in ED as thought she may have GERD, but she does not feel she is having reflux, so she never started.  She does drink coffee and lot of dark chocolate.  Does note some dysphagia down low at times.    2.  Having muscle spasms in bilateral lateral calf muscles--has had this for many years, but seems worse in past week or so.  Has also developed ankle pain without redness and swelling--notes mainly with stretching legs out in front and crossing at the ankles.   Is not has physically active.  She decreased walking daily out of laziness  Now walking 3 times weekly.    3.  Bright red blood with hard stool and straining last week.  Was streaking outside of stool and on paper with cleaning.  No recurrence.  States the BM was painful.    Current Meds  Medication Sig   aspirin  81 MG tablet Take 81 mg by mouth daily.   Calcium  Citrate 250 MG TABS 2 tabs by mouth twice daily.   cetirizine  (ZYRTEC ) 10 MG tablet Take 10 mg by mouth daily.   Cholecalciferol (DIALYVITE VITAMIN D  5000) 125 MCG (5000 UT) capsule Take 15,000 Units by mouth daily.   Cyanocobalamin (B-12 PO) Take 1 tablet by mouth daily.   ibuprofen  (ADVIL ) 200 MG tablet 2-4 tabs by mouth twice daily with meals as needed   isosorbide  mononitrate  (IMDUR ) 30 MG 24 hr tablet Take 1 tablet (30 mg total) by mouth daily.   Liniments (BLUE-EMU SUPER STRENGTH EX) Apply 1 application topically daily as needed (pain).   lisinopril  (ZESTRIL ) 40 MG tablet Take 1 tablet by mouth once daily   metoprolol  succinate (TOPROL -XL) 50 MG 24 hr tablet Take 1 tablet (50 mg total) by mouth as needed (PALPITATIONS). Take with or immediately following a meal.   mometasone  (NASONEX ) 50 MCG/ACT nasal spray USE 2 SPRAY(S) IN EACH NOSTRIL ONCE DAILY   Multiple Vitamins-Minerals (MULTIVITAMIN WITH MINERALS) tablet Take 1 tablet by mouth daily.   nitroGLYCERIN  (NITROSTAT ) 0.4 MG SL tablet DISSOLVE ONE TABLET UNDER THE TONGUE EVERY 5 MINUTES AS NEEDED FOR CHEST PAIN.  DO NOT EXCEED A TOTAL OF 3 DOSES IN 15 MINUTES   omega-3 acid ethyl esters (LOVAZA ) 1 g capsule Take 1 capsule (1 g total) by mouth 2 (two) times daily.   tirzepatide  (MOUNJARO ) 15 MG/0.5ML Pen Inject 15 mg into the skin once a week.   vitamin C (ASCORBIC ACID) 500 MG tablet Take 1,000 mg by mouth daily.   zinc gluconate 50 MG tablet Take 50 mg by mouth daily.   Allergies  Allergen Reactions   Praluent  [Alirocumab ]     Had significant fatigue  and memory loss--was lost driving in town.  Joint pain and nausea as well.   Repatha  [Evolocumab ] Other (See Comments)    cramps   Rosuvastatin      Leg cramping on 5-10mg  daily   Zetia [Ezetimibe] Other (See Comments)    Joint pain and ear ringing   Atorvastatin  Other (See Comments)    Pt states causes bilateral lower extremity muscle cramps   Pravastatin  Nausea And Vomiting and Other (See Comments)    Causes dizziness     Review of Systems    Objective:   BP 138/84 (BP Location: Left Arm, Patient Position: Sitting, Cuff Size: Normal)   Pulse 82   Resp 16   Ht 5' 1.5 (1.562 m)   Wt 168 lb (76.2 kg)   SpO2 96%   BMI 31.23 kg/m   Physical Exam HENT:     Head: Normocephalic and atraumatic.     Right Ear: Tympanic membrane normal.     Left  Ear: Tympanic membrane normal.     Nose: Nose normal.     Mouth/Throat:     Mouth: Mucous membranes are moist.     Pharynx: Oropharynx is clear.  Eyes:     Extraocular Movements: Extraocular movements intact.     Pupils: Pupils are equal, round, and reactive to light.  Cardiovascular:     Rate and Rhythm: Normal rate and regular rhythm.     Heart sounds: S1 normal and S2 normal. No murmur heard.    No friction rub. No S3 or S4 sounds.     Comments: No carotid bruits.  Carotid, radial, femoral, DP and PT pulses normal and equal.  Varicosities of LE bilaterally  Pulmonary:     Effort: Pulmonary effort is normal.     Breath sounds: Normal breath sounds.  Abdominal:     General: Bowel sounds are normal.     Palpations: Abdomen is soft.     Hernia: A hernia (Large ventral, unable to reduce.  Mild tenderness.  No discoloration.) is present.  Musculoskeletal:     Cervical back: Normal range of motion and neck supple.     Right lower leg: No edema.     Left lower leg: No edema.  Skin:    General: Skin is warm.     Findings: No rash.  Neurological:     Mental Status: She is alert.       Assessment & Plan   Chest pain:  encouraged returning to full dose of nitrates and taking tylenol  for HA.  Also to start Omeprazole  as recommended by ED as also with some dysphagia.  Follow reflux precautions, elevate HOB.    Await cardiac W/U  2.   Constipation with BRBPR:  Had colonoscopy in 2021.  Does have diverticulosis as well so need to control constipation.  Metamucil daily and increase water intake.    3.  Muscle cramps:  CMP and increase fluids.  Once clear from cardiology, to get back to regular physical activity.   4.  Varicose veins of LE:  recommended graduated compression stockings, leg elevation.  Limit sodium.

## 2024-03-20 NOTE — Patient Instructions (Signed)
 Follow GERD instructions Elevate legs when sitting.

## 2024-03-20 NOTE — Telephone Encounter (Signed)
 Patient needs an appointment in 2 months per Dr. Jayne Mews request.   Return in about 2 months (around 05/20/2024) for as close as possible --wait list too for that length of time if possible.   We will call patient if there is a cancellation.

## 2024-03-21 ENCOUNTER — Ambulatory Visit: Payer: Self-pay | Admitting: Internal Medicine

## 2024-03-21 LAB — COMPREHENSIVE METABOLIC PANEL WITH GFR
ALT: 15 IU/L (ref 0–32)
AST: 16 IU/L (ref 0–40)
Albumin: 4 g/dL (ref 3.9–4.9)
Alkaline Phosphatase: 64 IU/L (ref 44–121)
BUN/Creatinine Ratio: 14 (ref 12–28)
BUN: 15 mg/dL (ref 8–27)
Bilirubin Total: 0.2 mg/dL (ref 0.0–1.2)
CO2: 23 mmol/L (ref 20–29)
Calcium: 9.7 mg/dL (ref 8.7–10.3)
Chloride: 106 mmol/L (ref 96–106)
Creatinine, Ser: 1.09 mg/dL — ABNORMAL HIGH (ref 0.57–1.00)
Globulin, Total: 2.6 g/dL (ref 1.5–4.5)
Glucose: 90 mg/dL (ref 70–99)
Potassium: 4 mmol/L (ref 3.5–5.2)
Sodium: 142 mmol/L (ref 134–144)
Total Protein: 6.6 g/dL (ref 6.0–8.5)
eGFR: 55 mL/min/{1.73_m2} — ABNORMAL LOW (ref >59)

## 2024-03-25 ENCOUNTER — Telehealth (HOSPITAL_COMMUNITY): Payer: Self-pay | Admitting: Emergency Medicine

## 2024-03-25 NOTE — Telephone Encounter (Signed)
 Reaching out to patient to offer assistance regarding upcoming cardiac imaging study; pt verbalizes understanding of appt date/time, parking situation and where to check in, pre-test NPO status and medications ordered, and verified current allergies; name and call back number provided for further questions should they arise Rockwell Alexandria RN Navigator Cardiac Imaging [facility] [phone] office [phone] cell

## 2024-03-26 ENCOUNTER — Ambulatory Visit (HOSPITAL_COMMUNITY)
Admission: RE | Admit: 2024-03-26 | Discharge: 2024-03-26 | Disposition: A | Discharge status: Home / Self Care | Source: Ambulatory Visit | Attending: Physician Assistant | Admitting: Physician Assistant

## 2024-03-26 DIAGNOSIS — R0789 Other chest pain: Secondary | ICD-10-CM | POA: Diagnosis present

## 2024-03-26 LAB — NM PET CT CARDIAC PERFUSION MULTI W/ABSOLUTE BLOODFLOW
LV dias vol: 55 mL (ref 46–106)
LV sys vol: 27 mL
MBFR: 3.55
Nuc Rest EF: 52 %
Nuc Stress EF: 59 %
Rest MBF: 0.88 ml/g/min
Rest Nuclear Isotope Dose: 20.2 mCi
ST Depression (mm): 0 mm
Stress MBF: 3.12 ml/g/min
Stress Nuclear Isotope Dose: 20 mCi

## 2024-03-26 MED ORDER — REGADENOSON 0.4 MG/5ML IV SOLN
0.4000 mg | Freq: Once | INTRAVENOUS | Status: AC
  Administered 2024-03-26: 0.4 mg via INTRAVENOUS

## 2024-03-26 MED ORDER — RUBIDIUM RB82 GENERATOR (RUBYFILL)
20.1900 | PACK | Freq: Once | INTRAVENOUS | Status: AC
  Administered 2024-03-26: 20.19 via INTRAVENOUS

## 2024-03-26 MED ORDER — RUBIDIUM RB82 GENERATOR (RUBYFILL)
19.9200 | PACK | Freq: Once | INTRAVENOUS | Status: AC
Start: 2024-03-26 — End: 2024-03-26
  Administered 2024-03-26: 19.92 via INTRAVENOUS

## 2024-03-26 MED ORDER — REGADENOSON 0.4 MG/5ML IV SOLN
INTRAVENOUS | Status: AC
Start: 2024-03-26 — End: ?
  Filled 2024-03-26: qty 5

## 2024-03-27 ENCOUNTER — Ambulatory Visit: Payer: Self-pay | Admitting: Student

## 2024-04-04 NOTE — Progress Notes (Signed)
 Cardiology Office Note:    Date:  04/07/2024   ID:  Haley [name], DOB 1955-11-17, MRN [id number]  PCP:  Haley Cocking, MD   [facility] Providers Cardiologist:  Euell Herrlich, MD Cardiology APP:  Haley Pilot, PA {  Referring MD: Haley Cocking, MD   Chief Complaint  Patient presents with   Follow-up    HTN, noncardiac chest pain    History of Present Illness:    Haley [name] is a 68 y.o. female with a hx of nonobstructive CAD, hypertension, hyperlipidemia, DM2, and prior CVA.  She has been intolerant to statins, Repatha , and Nexletol  secondary to muscle cramps.  Coronary CTA in 2017 showed a calcium  score of 239 placing her at the 93rd percentile.  She had moderate diffuse CAD.  Cardiac catheterization 10/2016 showed mild nonobstructive disease in the LAD, moderate stenosis in the mid to distal PDA.  LV function was normal.  Medical therapy was recommended.  Repeat heart catheterization 05/04/2020 showed nonobstructive CAD similar to prior study.  She was seen in the ER 10/25/2022 with palpitations and dyspnea.  ER workup was reassuring.  She had a negative Myoview  and echocardiogram that showed preserved LVEF 55 to 60%, no WMA, no significant valvular disease.  At last visit in January 2025 she had lost 50 pounds with Zepbound .  She presented to the ER 03/05/24 with chest pain concerning for angina.  She ruled out with negative enzymes and a nonischemic EKG.    I saw her in clinic 03/07/2024.  She discontinued amlodipine  due to gas.  She continued to have chest pain and palpitations despite 50 mg Toprol  every morning.  She proceeded to PET stress test which was unremarkable, no evidence of ischemia.  I started low-dose Imdur  and reduced her dose of lisinopril  at last visit.  She presents today for cardiology follow-up.  She continues to have chest pain but now also has headaches with imdur . She has "episodes" of chest pain every other night -  generally occurring at bedtime or waking her from sleep.   She remains active and denies any exertional chest pain. Her omeprazole  script ran out last week. She also thinks her symptoms may be related to hypothyroidism and would like her TSH checked, she remains tired.   BP controlled at home in the 100-110s range systolic range.    Past Medical History:  Diagnosis Date   Back pain    Coronary artery disease    Depression    DM type 2, controlled, with complication (HCC) 07/22/2013   Type II DM diagnosed in ~ 2006 per pt hx.    Environmental and seasonal allergies    Ganglion cyst of wrist, right 05/09/2017   Hyperlipidemia    Hypertension    Itching    Refusal of blood transfusions as patient is Jehovah's Witness    Shingles    Stroke (HCC) 2000   per pt. report:   TIA    Past Surgical History:  Procedure Laterality Date   ABDOMINAL HYSTERECTOMY  1994   Laparoscopic; ovaries still intact.  Performed for prolapsed uterus.   AREOLA/NIPPLE RECONSTRUCTION WITH GRAFT Bilateral 09/19/2021   Procedure: FREE NIPPLE GRAFT;  Surgeon: Barb Bonito, MD;  Location: [facility] OR;  Service: Plastics;  Laterality: Bilateral;   BREAST REDUCTION SURGERY Bilateral 09/19/2021   Procedure: Bilateral Breast Reduction;  Surgeon: Barb Bonito, MD;  Location: [facility] OR;  Service: Plastics;  Laterality: Bilateral;  2 hours   CARDIAC CATHETERIZATION N/A  10/23/2016   Procedure: Left Heart Cath and Coronary Angiography;  Surgeon: Odie Benne, MD;  Location: [facility] INVASIVE CV LAB;  Service: Cardiovascular;  Laterality: N/A;   CHOLECYSTECTOMY  1997   Laparoscopic   COLONOSCOPY  2005-at age 70   Hyperplastic polyp--[place]   LEFT HEART CATH AND CORONARY ANGIOGRAPHY N/A 05/04/2020   Procedure: LEFT HEART CATH AND CORONARY ANGIOGRAPHY;  Surgeon: Swazil[place], Peter M, MD;  Location: [facility] INVASIVE CV LAB;  Service: Cardiovascular;  Laterality: N/A;   WISDOM TOOTH EXTRACTION      Current  Medications: Current Meds  Medication Sig   aspirin  81 MG tablet Take 81 mg by mouth daily.   Calcium  Citrate 250 MG TABS 2 tabs by mouth twice daily.   Cholecalciferol (DIALYVITE VITAMIN D  5000) 125 MCG (5000 UT) capsule Take 15,000 Units by mouth daily.   Cyanocobalamin (B-12 PO) Take 1 tablet by mouth daily.   Flaxseed, Linseed, (FLAXSEED OIL) 1000 MG CAPS Take 1,000 mg by mouth daily at 8 pm.   GARLIC PO Take 6,000 mg by mouth daily.   Liniments (BLUE-EMU SUPER STRENGTH EX) Apply 1 application topically daily as needed (pain).   metoprolol  succinate (TOPROL -XL) 50 MG 24 hr tablet Take 1 tablet (50 mg total) by mouth as needed (PALPITATIONS). Take with or immediately following a meal. (Patient taking differently: Take 75 mg by mouth daily. Take with or immediately following a meal.)   mometasone  (NASONEX ) 50 MCG/ACT nasal spray USE 2 SPRAY(S) IN EACH NOSTRIL ONCE DAILY   Multiple Vitamins-Minerals (MULTIVITAMIN WITH MINERALS) tablet Take 1 tablet by mouth daily.   nitroGLYCERIN  (NITROSTAT ) 0.4 MG SL tablet DISSOLVE ONE TABLET UNDER THE TONGUE EVERY 5 MINUTES AS NEEDED FOR CHEST PAIN.  DO NOT EXCEED A TOTAL OF 3 DOSES IN 15 MINUTES   omega-3 acid ethyl esters (LOVAZA ) 1 g capsule Take 1 capsule (1 g total) by mouth 2 (two) times daily.   Psyllium (METAMUCIL 3 IN 1 DAILY FIBER) 400 MG CAPS Use as directed daily with 8 oz water   tirzepatide  (MOUNJARO ) 15 MG/0.5ML Pen Inject 15 mg into the skin once a week.   vitamin C (ASCORBIC ACID) 500 MG tablet Take 1,000 mg by mouth daily.   zinc gluconate 50 MG tablet Take 50 mg by mouth daily.   [DISCONTINUED] isosorbide  mononitrate (IMDUR ) 30 MG 24 hr tablet Take 1 tablet (30 mg total) by mouth daily.   [DISCONTINUED] lisinopril  (ZESTRIL ) 40 MG tablet Take 1 tablet by mouth once daily (Patient taking differently: Take 20 mg by mouth daily.)     Allergies:   Praluent  [alirocumab ], Repatha  [evolocumab ], Rosuvastatin , Zetia [ezetimibe], Atorvastatin , and  Pravastatin    Social History   Socioeconomic History   Marital status: Single    Spouse name: Not on file   Number of children: 2   Years of education: 12+   Highest education level: Some college, no degree  Occupational History   Occupation: Print production planner    Comment: Retired now  Tobacco Use   Smoking status: Never    Passive exposure: Never   Smokeless tobacco: Never  Vaping Use   Vaping status: Never Used  Substance and Sexual Activity   Alcohol use: Yes    Alcohol/week: 0.0 standard drinks of alcohol    Comment: occasionally-1 to 2 drinks monthly    Drug use: No   Sexual activity: Not Currently    Birth control/protection: Surgical  Other Topics Concern   Not on file  Social History Narrative  Did get some post high school training/education   Lives alone   Watches 2 great grandchildren frequently   Daughter lives in [place] and checks in regularly.   Social Drivers of Corporate investment banker Strain: Low Risk  (04/18/2023)   Overall Financial Resource Strain (CARDIA)    Difficulty of Paying Living Expenses: Not hard at all  Food Insecurity: No Food Insecurity (04/18/2023)   Hunger Vital Sign    Worried About Running Out of Food in the Last Year: Never true    Ran Out of Food in the Last Year: Never true  Transportation Needs: No Transportation Needs (04/18/2023)   PRAPARE - Administrator, Civil Service (Medical): No    Lack of Transportation (Non-Medical): No  Physical Activity: Unknown (08/15/2021)   Received [facility]   Exercise Vital Sign    Days of Exercise per Week: Patient declined    Minutes of Exercise per Session: Not on file  Stress: Unknown (08/15/2021)   Received from [facility] - Occupational Stress Questionnaire    Feeling of Stress : Patient declined  Social Connections: Unknown (03/17/2022)   Received from [facility]    Social Network    Social Network: Not on file     Family History: The patient's family history includes Cancer (age of onset: 12) in her sister; Diabetes in her brother, father, sister, and son; Hypertension in her brother and son; Seizures in her brother; Stroke in her maternal aunt.  ROS:   Please see the history of present illness.     All other systems reviewed and are negative.  EKGs/Labs/Other Studies Reviewed:    The following studies were reviewed today: PET stress test       Recent Labs: 04/17/2023: TSH 2.410 03/05/2024: Hemoglobin 13.2; Platelets 299 03/20/2024: ALT 15; BUN 15; Creatinine, Ser 1.09; Potassium 4.0; Sodium 142  Recent Lipid Panel    Component Value Date/Time   CHOL 189 11/16/2023 1045   TRIG 50 11/16/2023 1045   HDL 52 11/16/2023 1045   CHOLHDL 3.6 11/16/2023 1045   LDLCALC 127 (H) 11/16/2023 1045   LDLDIRECT 134 (H) 07/22/2013 1620     Risk Assessment/Calculations:           Physical Exam:    VS:  BP (!) 168/100 (BP Location: Left Arm, Patient Position: Sitting, Cuff Size: Normal)   Pulse 82   Ht 5\' 1"  (1.549 m)   Wt 170 lb 3.2 oz (77.2 kg)   SpO2 96%   BMI 32.16 kg/m     Wt Readings from Last 3 Encounters:  04/07/24 170 lb 3.2 oz (77.2 kg)  03/20/24 168 lb (76.2 kg)  03/07/24 174 lb 6.4 oz (79.1 kg)     GEN:  Well nourished, well developed in no acute distress HEENT: Normal NECK: No JVD; No carotid bruits LYMPHATICS: No lymphadenopathy CARDIAC: RRR, no murmurs, rubs, gallops RESPIRATORY:  Clear to auscultation without rales, wheezing or rhonchi  ABDOMEN: Soft, non-tender, non-distended MUSCULOSKELETAL:  No edema; No deformity  SKIN: Warm and dry NEUROLOGIC:  Alert and oriented x 3 PSYCHIATRIC:  Normal affect   ASSESSMENT:    1. Gastroesophageal reflux disease, unspecified whether esophagitis present   2. Essential hypertension   3. Memory change   4. Medication management   5. Chest discomfort   6. Hyperlipidemia with  target LDL less than 70   7. Statin intolerance  8. Coronary artery disease involving native coronary artery of native heart without angina pectoris    PLAN:    In order of problems listed above:  Chest pain Nonobstructive CAD - LHC 2017, 2021 with stable nonobstructive disease - Myoview  2024 negative for ischemia -PET stress test 03/2024 negative for ischemia -Chest pain felt noncardiac versus microvascular -She did not tolerate amlodipine  secondary to gas -I started low-dose Imdur  at last visit and reduced lisinopril  to 20 mg -- she continues to have chest discomfort and now headaches -- will D/C imdur , go back to 40 mg lisinopril  -- continue 75 mg toprol  -- she continues to have controlled BP at home   Hypertension - 40 mg lisinopril , 75 mg Toprol  - She did not tolerate amlodipine  due to gas - She is always hypertensive when coming to a doctor's office   Hyperlipidemia with LDL goal less than 70 - Continue 5 mg Crestor  - Has not been tolerant to increased doses, Repatha , or Nexletol  11/16/2023: Cholesterol, Total 189; HDL 52; LDL Chol Calc (NIH) 127; Triglycerides 50   DM2 - Last A1c 5.6% - Has done well on Zepbound    Fatigue Palpitations - will check TSH - she denies snoring, but is having daytime fatigue - if all other workup negative, consider sleep study (she denies snoring, but can take naps if time allows)   GERD - will refer to GI for further workup - I refilled prilosec for now   Chest pain occurs when she lays down for sleep or wakes her from sleep at night.  She is quite active at baseline, she walked to our office from down the street, and denies exertional angina.  I do not think her chest pain is cardiac in nature.  I will refer to GI.  She also reports memory issues, I will refer to neurology.  She requests TSH check for her fatigue.  I think if all other workup is negative, we need to consider sleep study.    Follow up in 6 months        Medication Adjustments/Labs and Tests Ordered: Current medicines are reviewed at length with the patient today.  Concerns regarding medicines are outlined above.  Orders Placed This Encounter  Procedures   TSH+T4F+T3Free   Ambulatory referral to Gastroenterology   Ambulatory referral to Neurology   Meds ordered this encounter  Medications   omeprazole  (PRILOSEC) 20 MG capsule    Sig: Take 1 capsule (20 mg total) by mouth daily.    Dispense:  30 capsule    Refill:  1   lisinopril  (ZESTRIL ) 40 MG tablet    Sig: Take 1 tablet (40 mg total) by mouth daily.    Dispense:  90 tablet    Refill:  2    Patient Instructions  Medication Instructions:  Stop Imdur  Start Lisinopril  40 mg daily   *If you need a refill on your cardiac medications before your next appointment, please call your pharmacy*  Lab Work: TSH today  Testing/Procedures: NONE ordered at this time of appointment   Follow-Up: At [facility], you and your health needs are our priority.  As part of our continuing mission to provide you with exceptional heart care, our providers are all part of one team.  This team includes your primary Cardiologist (physician) and Advanced Practice Providers or APPs (Physician Assistants and Nurse Practitioners) who all work together to provide you with the care you need, when you need it.  Your next appointment:   6 month(s)  Provider:   Euell Herrlich, MD or Marcie Sever, PA-C          We recommend signing up for the patient portal called "MyChart".  Sign up information is provided on this After Visit Summary.  MyChart is used to connect with patients for Virtual Visits (Telemedicine).  Patients are able to view lab/test results, encounter notes, upcoming appointments, etc.  Non-urgent messages can be sent to your provider as well.   To learn more about what you can do with MyChart, go to [email or web address].   Other Instructions        Signed, Haley [name], [place]  04/07/2024 11:18 AM    [facility]

## 2024-04-07 ENCOUNTER — Encounter: Payer: Self-pay | Admitting: Physician Assistant

## 2024-04-07 ENCOUNTER — Ambulatory Visit: Admitting: Physician Assistant

## 2024-04-07 ENCOUNTER — Ambulatory Visit: Attending: Physician Assistant | Admitting: Physician Assistant

## 2024-04-07 VITALS — BP 168/100 | HR 82 | Ht 61.0 in | Wt 170.2 lb

## 2024-04-07 DIAGNOSIS — Z789 Other specified health status: Secondary | ICD-10-CM

## 2024-04-07 DIAGNOSIS — R0789 Other chest pain: Secondary | ICD-10-CM

## 2024-04-07 DIAGNOSIS — K219 Gastro-esophageal reflux disease without esophagitis: Secondary | ICD-10-CM

## 2024-04-07 DIAGNOSIS — E785 Hyperlipidemia, unspecified: Secondary | ICD-10-CM

## 2024-04-07 DIAGNOSIS — R413 Other amnesia: Secondary | ICD-10-CM

## 2024-04-07 DIAGNOSIS — Z79899 Other long term (current) drug therapy: Secondary | ICD-10-CM

## 2024-04-07 DIAGNOSIS — I2510 Atherosclerotic heart disease of native coronary artery without angina pectoris: Secondary | ICD-10-CM

## 2024-04-07 DIAGNOSIS — I10 Essential (primary) hypertension: Secondary | ICD-10-CM

## 2024-04-07 MED ORDER — OMEPRAZOLE 20 MG PO CPDR: 20.0000 mg | DELAYED_RELEASE_CAPSULE | Freq: Every day | ORAL | 1 refills | Status: DC

## 2024-04-07 MED ORDER — LISINOPRIL 40 MG PO TABS: 40.0000 mg | ORAL_TABLET | Freq: Every day | ORAL | 2 refills | Status: AC

## 2024-04-07 NOTE — Patient Instructions (Signed)
 Medication Instructions:  Stop Imdur  Start Lisinopril  40 mg daily   *If you need a refill on your cardiac medications before your next appointment, please call your pharmacy*  Lab Work: TSH today  Testing/Procedures: NONE ordered at this time of appointment   Follow-Up: At [facility], you and your health needs are our priority.  As part of our continuing mission to provide you with exceptional heart care, our providers are all part of one team.  This team includes your primary Cardiologist (physician) and Advanced Practice Providers or APPs (Physician Assistants and Nurse Practitioners) who all work together to provide you with the care you need, when you need it.  Your next appointment:   6 month(s)  Provider:   Euell Herrlich, MD or Marcie Sever, PA-C          We recommend signing up for the patient portal called "MyChart".  Sign up information is provided on this After Visit Summary.  MyChart is used to connect with patients for Virtual Visits (Telemedicine).  Patients are able to view lab/test results, encounter notes, upcoming appointments, etc.  Non-urgent messages can be sent to your provider as well.   To learn more about what you can do with MyChart, go to [email or web address].   Other Instructions

## 2024-04-08 LAB — TSH+T4F+T3FREE
Free T4: 1.21 ng/dL (ref 0.82–1.77)
T3, Free: 2.8 pg/mL (ref 2.0–4.4)
TSH: 2.23 u[IU]/mL (ref 0.450–4.500)

## 2024-04-09 ENCOUNTER — Ambulatory Visit: Payer: Self-pay | Admitting: Physician Assistant

## 2024-05-06 ENCOUNTER — Other Ambulatory Visit (HOSPITAL_COMMUNITY)

## 2024-05-16 NOTE — Telephone Encounter (Signed)
 Patient has been scheduled

## 2024-05-19 ENCOUNTER — Ambulatory Visit (INDEPENDENT_AMBULATORY_CARE_PROVIDER_SITE_OTHER): Admitting: Internal Medicine

## 2024-05-19 ENCOUNTER — Encounter: Payer: Self-pay | Admitting: Internal Medicine

## 2024-05-19 VITALS — BP 160/88 | HR 76 | Resp 16 | Ht 61.0 in | Wt 163.8 lb

## 2024-05-19 DIAGNOSIS — K625 Hemorrhage of anus and rectum: Secondary | ICD-10-CM | POA: Diagnosis not present

## 2024-05-19 DIAGNOSIS — J0100 Acute maxillary sinusitis, unspecified: Secondary | ICD-10-CM | POA: Diagnosis not present

## 2024-05-19 DIAGNOSIS — R079 Chest pain, unspecified: Secondary | ICD-10-CM | POA: Diagnosis not present

## 2024-05-19 DIAGNOSIS — K2100 Gastro-esophageal reflux disease with esophagitis, without bleeding: Secondary | ICD-10-CM

## 2024-05-19 DIAGNOSIS — I10 Essential (primary) hypertension: Secondary | ICD-10-CM

## 2024-05-19 DIAGNOSIS — K5900 Constipation, unspecified: Secondary | ICD-10-CM | POA: Insufficient documentation

## 2024-05-19 MED ORDER — OMEPRAZOLE 20 MG PO CPDR: DELAYED_RELEASE_CAPSULE | ORAL | 6 refills | Status: DC

## 2024-05-19 MED ORDER — OMEGA-3-ACID ETHYL ESTERS 1 G PO CAPS: 1.0000 g | ORAL_CAPSULE | Freq: Two times a day (BID) | ORAL | 3 refills | Status: AC

## 2024-05-19 MED ORDER — AMOXICILLIN-POT CLAVULANATE 875-125 MG PO TABS: 1.0000 | ORAL_TABLET | Freq: Two times a day (BID) | ORAL | 0 refills | Status: DC

## 2024-05-19 NOTE — Progress Notes (Signed)
 Subjective:    Patient ID: Haley [name], female   DOB: 14-Nov-1955, 68 y.o.   MRN: [id number]   HPI   Chest pain:  still having, though less often than previous.  Did not note improvement with PPI.  She does feel elevating HOB with wedge pillow has helped.  Does improve with SL ntg.  Her PET CT Cardiac perfusion scan was low risk end of May and pain not felt to be cardiac.    2. Constipation:  Most of time with hard stools and needing to strain to pass a stool.  No longer with BRBPR.  Drinking more water-- three 16 oz bottles of water daily.   and taking 5 caps of Metamucil daily.    3.  Hypertension:  BP at home running 118/73--this is similar most days and her BP monitor has been checked and similar to ours.     4.  Has headaches--temporal and maxillary areas.  + Posterior pharyngeal drainage.  Bad taste.  Left throat also hurts.  Perhaps a fever at night.   Ears are full  Current Meds  Medication Sig   aspirin  81 MG tablet Take 81 mg by mouth daily.   Calcium  Citrate 250 MG TABS 2 tabs by mouth twice daily. (Patient taking differently: 2 tabs by mouth once daily.)   Cholecalciferol (DIALYVITE VITAMIN D  5000) 125 MCG (5000 UT) capsule Take 15,000 Units by mouth daily.   Cyanocobalamin (B-12 PO) Take 1 tablet by mouth daily.   Flaxseed, Linseed, (FLAXSEED OIL) 1000 MG CAPS Take 1,000 mg by mouth daily at 8 pm.   GARLIC PO Take 6,000 mg by mouth daily.   Liniments (BLUE-EMU SUPER STRENGTH EX) Apply 1 application topically daily as needed (pain).   lisinopril  (ZESTRIL ) 40 MG tablet Take 1 tablet (40 mg total) by mouth daily.   metoprolol  succinate (TOPROL -XL) 50 MG 24 hr tablet Take 1 tablet (50 mg total) by mouth as needed (PALPITATIONS). Take with or immediately following a meal. (Patient taking differently: Take 75 mg by mouth daily. Take with or immediately following a meal.)   mometasone  (NASONEX ) 50 MCG/ACT nasal spray USE 2 SPRAY(S) IN EACH NOSTRIL ONCE DAILY   Multiple  Vitamins-Minerals (MULTIVITAMIN WITH MINERALS) tablet Take 1 tablet by mouth daily.   nitroGLYCERIN  (NITROSTAT ) 0.4 MG SL tablet DISSOLVE ONE TABLET UNDER THE TONGUE EVERY 5 MINUTES AS NEEDED FOR CHEST PAIN.  DO NOT EXCEED A TOTAL OF 3 DOSES IN 15 MINUTES   omega-3 acid ethyl esters (LOVAZA ) 1 g capsule Take 1 capsule (1 g total) by mouth 2 (two) times daily.   omeprazole  (PRILOSEC) 20 MG capsule Take 1 capsule (20 mg total) by mouth daily.   Psyllium (METAMUCIL 3 IN 1 DAILY FIBER) 400 MG CAPS Use as directed daily with 8 oz water   tirzepatide  (MOUNJARO ) 15 MG/0.5ML Pen Inject 15 mg into the skin once a week.   vitamin C (ASCORBIC ACID) 500 MG tablet Take 1,000 mg by mouth daily.   zinc gluconate 50 MG tablet Take 50 mg by mouth daily.   Allergies  Allergen Reactions   Praluent  [Alirocumab ]     Had significant fatigue and memory loss--was lost driving in town.  Joint pain and nausea as well.   Repatha  [Evolocumab ] Other (See Comments)    cramps   Rosuvastatin      Leg cramping on 5-10mg  daily   Zetia [Ezetimibe] Other (See Comments)    Joint pain and ear ringing   Atorvastatin  Other (  See Comments)    Pt states causes bilateral lower extremity muscle cramps   Pravastatin  Nausea And Vomiting and Other (See Comments)    Causes dizziness     Review of Systems    Objective:   BP (!) 160/88 (BP Location: Left Arm, Patient Position: Sitting, Cuff Size: Normal)   Pulse 76   Resp 16   Ht 5' 1 (1.549 m)   Wt 163 lb 12 oz (74.3 kg)   BMI 30.94 kg/m   Physical Exam NAD HEENT:  Tender over frontal and maxillary sinuses.  PERRL, EOMI, TMs pearly gray, throat with mild injection on left Neck:  Supple, No adenopathy Chest:  CTA CV:  RRR with normal Carotid, radial and DP pulses.  No LE edema. Abd:  S, + BS, NT    Assessment & Plan   Chest pain, most likely not cardiac.  ?esophageal spasm?  Increase Omeprazole  to 20 mg twice daily on empty stomach.  Continue elevation of HOB, NTG  as needed.  Has GI appt coming up.    2.  Constipation:  somewhat improved.  Increase Metamucil gradually with a second dose up to 5 caps in the evening.  Continue same dose in morning.  Increase water intake.  Hold on starting Miralax or mineral oil at this time   She would like to see if the Metamucil alone helps.    3.  Hypertension:  BPs remain in good range at home.  We have checked our monitor against hers in past and equivalent.    4.  Sinusitis:  Augmentin  875/125 twice daily for 7 days.  Neti pot.    5.  DM:  well controlled with Mounjaro .

## 2024-05-19 NOTE — Patient Instructions (Signed)
 Neti pot daily distilled water

## 2024-05-28 ENCOUNTER — Telehealth: Payer: Self-pay | Admitting: Internal Medicine

## 2024-05-28 NOTE — Telephone Encounter (Signed)
 Patient called today and states that she would like for Doctor to prescribed her medication for a yeast infection. Patient states she has a yeast infection from taking antibiotics.

## 2024-06-03 MED ORDER — FLUCONAZOLE 150 MG PO TABS
ORAL_TABLET | ORAL | 0 refills | Status: DC
Start: 2024-06-03 — End: 2024-07-01

## 2024-06-03 NOTE — Telephone Encounter (Signed)
Notified Rx sent

## 2024-07-01 ENCOUNTER — Ambulatory Visit: Admitting: Gastroenterology

## 2024-07-01 ENCOUNTER — Encounter: Payer: Self-pay | Admitting: Gastroenterology

## 2024-07-01 ENCOUNTER — Other Ambulatory Visit: Payer: Self-pay | Admitting: Internal Medicine

## 2024-07-01 VITALS — BP 134/84 | HR 73 | Ht 61.0 in | Wt 166.2 lb

## 2024-07-01 DIAGNOSIS — K219 Gastro-esophageal reflux disease without esophagitis: Secondary | ICD-10-CM | POA: Diagnosis not present

## 2024-07-01 DIAGNOSIS — K429 Umbilical hernia without obstruction or gangrene: Secondary | ICD-10-CM

## 2024-07-01 DIAGNOSIS — K5909 Other constipation: Secondary | ICD-10-CM | POA: Diagnosis not present

## 2024-07-01 DIAGNOSIS — R1310 Dysphagia, unspecified: Secondary | ICD-10-CM | POA: Diagnosis not present

## 2024-07-01 DIAGNOSIS — R0789 Other chest pain: Secondary | ICD-10-CM | POA: Diagnosis not present

## 2024-07-01 DIAGNOSIS — K5904 Chronic idiopathic constipation: Secondary | ICD-10-CM

## 2024-07-01 MED ORDER — PANTOPRAZOLE SODIUM 40 MG PO TBEC
40.0000 mg | DELAYED_RELEASE_TABLET | Freq: Two times a day (BID) | ORAL | 3 refills | Status: AC
Start: 2024-07-01 — End: ?

## 2024-07-01 NOTE — Patient Instructions (Addendum)
 VISIT SUMMARY:  Today, we addressed your recurrent chest pain, GERD symptoms, difficulty swallowing, paraumbilical hernia, and constipation. We have made some changes to your medications and recommended further evaluations to better understand and manage your symptoms.  YOUR PLAN:  GASTROESOPHAGEAL REFLUX DISEASE (GERD) WITH PERSISTENT CHEST PAIN: You have been experiencing chest pain primarily at night, which is likely due to GERD. Your previous medication was not effective. -Start taking pantoprazole  40 mg twice daily. -We will schedule an upper endoscopy to check your esophagus for any damage or infection and to perform dilation if necessary. -Use a wedge pillow to elevate your upper body while sleeping.  DYSPHAGIA TO LIQUIDS AND SOLIDS: You have difficulty swallowing both liquids and solids, more frequently with liquids. -If a stricture is found during the endoscopy, we will consider esophageal dilation.  PARAUMBILICAL HERNIA: You have a hernia near your belly button causing discomfort. It is not causing any serious problems right now. -Manually reduce the hernia before bedtime. -Monitor the hernia for any signs of obstruction or increased pain. If symptoms worsen, we may consider surgical repair.  CONSTIPATION: You have chronic constipation that you have been managing with Senokot. -Start taking Miralax, one capful daily, and adjust the dose as needed. -Continue using Senokot as needed.  Due to recent changes in healthcare laws, you may see the results of your imaging and laboratory studies on MyChart before your provider has had a chance to review them.  We understand that in some cases there may be results that are confusing or concerning to you. Not all laboratory results come back in the same time frame and the provider may be waiting for multiple results in order to interpret others.  Please give us  48 hours in order for your provider to thoroughly review all the results before  contacting the office for clarification of your results.    I appreciate the  opportunity to care for you  Thank You   Kavitha Nandigam , MD

## 2024-07-01 NOTE — Progress Notes (Signed)
 "                Haley [name]    [id number]    [birth year]  Primary Care Physician:Mulberry, Almarie, MD  Referring Physician: Adella Almarie, MD [address] [place],  [place] [address]   Chief complaint: Atypical chest pain  Discussed the use of AI scribe software for clinical note transcription with the patient, who gave verbal consent to proceed.  History of Present Illness Haley [name] is a 68 year old female who presents with recurrent chest pain and GERD symptoms.  Chest pain and palpitations - Recurrent chest pain for almost two years, occurring at least twice per week - Pain frequently occurs at night, often waking her from sleep - Daytime episodes also occur, including an episode in April 2025 after drinking coffee - Episodes associated with sensation of heart 'beating, speeding up' - Cardiac workups have been normal  Gastroesophageal reflux and dysphagia - Persistent GERD symptoms despite omeprazole  therapy - Heartburn and chest pain not alleviated by omeprazole  - Indigestion-like symptoms followed by stomach pain during episodes - Difficulty swallowing, particularly with liquids, sometimes resulting in choking - Choking also occurs with food, but less frequently than with liquids  Abdominal discomfort and constipation - Paraumbilical hernia causing lower abdominal discomfort - Constipation managed with Senokot  Colonoscopy 04/30/2020 - Severe diverticulosis in the sigmoid colon, in the descending colon, in the transverse colon and in the ascending colon. There was evidence of diverticular spasm. Peri- diverticular erythema was seen. There was evidence of an impacted diverticulum. - Non- bleeding internal hemorrhoids.   NM PET CT cardiac perfusion 03/26/2024   LV perfusion is normal. There is no evidence of ischemia. There is no evidence of infarction.   Rest EF: 52%. Stress left ventricular function is normal. Stress EF: 59%. End diastolic cavity size is  normal. End systolic cavity size is normal.   Myocardial blood flow was computed to be 0.44ml/g/min at rest and 3.12ml/g/min at stress. Global myocardial blood flow reserve was 3.55 and was normal.   Coronary calcium  was present on the attenuation correction CT images. Moderate coronary calcifications were present. Coronary calcifications were present in the left anterior descending artery, left circumflex artery and right coronary artery distribution(s).   The study is normal. The study is low risk.   Poor quality study requiring extensive reporcessing with low counts and LV contour mis registration  CT abd & pelvis without contrast 12/20/2023 1. No acute abnormality in the abdomen or pelvis. 2. Extensive colonic diverticulosis without evidence of acute diverticulitis. 3. Large fat containing paraumbilical hernia. 4.  Aortic Atherosclerosis (ICD10-I70.0).   Outpatient Encounter Medications as of 07/01/2024  Medication Sig   aspirin  81 MG tablet Take 81 mg by mouth daily.   Calcium  Citrate 250 MG TABS 2 tabs by mouth twice daily.   Cholecalciferol (DIALYVITE VITAMIN D  5000) 125 MCG (5000 UT) capsule Take 15,000 Units by mouth daily.   Cyanocobalamin  (B-12 PO) Take 1 tablet by mouth daily.   Flaxseed, Linseed, (FLAXSEED OIL) 1000 MG CAPS Take 1,000 mg by mouth daily at 8 pm.   GARLIC PO Take 6,000 mg by mouth daily.   Liniments (BLUE-EMU SUPER STRENGTH EX) Apply 1 application topically daily as needed (pain).   lisinopril  (ZESTRIL ) 40 MG tablet Take 1 tablet (40 mg total) by mouth daily.   metoprolol  succinate (TOPROL -XL) 50 MG 24 hr tablet Take 1 tablet (50 mg total) by mouth as needed (PALPITATIONS). Take with or immediately  following a meal.   mometasone  (NASONEX ) 50 MCG/ACT nasal spray USE 2 SPRAY(S) IN EACH NOSTRIL ONCE DAILY   Multiple Vitamins-Minerals (MULTIVITAMIN WITH MINERALS) tablet Take 1 tablet by mouth daily.   nitroGLYCERIN  (NITROSTAT ) 0.4 MG SL tablet DISSOLVE ONE TABLET  UNDER THE TONGUE EVERY 5 MINUTES AS NEEDED FOR CHEST PAIN.  DO NOT EXCEED A TOTAL OF 3 DOSES IN 15 MINUTES   omega-3 acid ethyl esters (LOVAZA ) 1 g capsule Take 1 capsule (1 g total) by mouth 2 (two) times daily.   omeprazole  (PRILOSEC) 20 MG capsule 1 cap by mouth on empty stomach twice daily.   Psyllium (METAMUCIL 3 IN 1 DAILY FIBER) 400 MG CAPS Use as directed daily with 8 oz water    tirzepatide  (MOUNJARO ) 15 MG/0.5ML Pen Inject 15 mg into the skin once a week.   vitamin C (ASCORBIC ACID) 500 MG tablet Take 1,000 mg by mouth daily.   zinc gluconate 50 MG tablet Take 50 mg by mouth daily.   [DISCONTINUED] amoxicillin -clavulanate (AUGMENTIN ) 875-125 MG tablet Take 1 tablet by mouth 2 (two) times daily.   [DISCONTINUED] fluconazole  (DIFLUCAN ) 150 MG tablet 1 tab by mouth daily for 2 days.   No facility-administered encounter medications on file as of 07/01/2024.    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    Environmental and seasonal allergies    Ganglion cyst of wrist, right 05/09/2017   Hyperlipidemia    Hypertension    Itching    Refusal of blood transfusions as patient is Jehovah's Witness    Shingles    Stroke (HCC) 2000   per pt. report:   TIA    Past Surgical History:  Procedure Laterality Date   ABDOMINAL HYSTERECTOMY  1994   Laparoscopic; ovaries still intact.  Performed for prolapsed uterus.   AREOLA/NIPPLE RECONSTRUCTION WITH GRAFT Bilateral  09/19/2021   Procedure: FREE NIPPLE GRAFT;  Surgeon: Elisabeth Craig RAMAN, MD;  Location: [facility] OR;  Service: Plastics;  Laterality: Bilateral;   BREAST REDUCTION SURGERY Bilateral 09/19/2021   Procedure: Bilateral Breast Reduction;  Surgeon: Elisabeth Craig RAMAN, MD;  Location: [facility] OR;  Service: Plastics;  Laterality: Bilateral;  2 hours   CARDIAC CATHETERIZATION N/A 10/23/2016   Procedure: Left Heart Cath and Coronary Angiography;  Surgeon: Lonni JONETTA Cash, MD;  Location: [facility] INVASIVE CV LAB;  Service: Cardiovascular;  Laterality: N/A;   CHOLECYSTECTOMY  1997   Laparoscopic   COLONOSCOPY  2005-at age 72   Hyperplastic polyp--[place]   LEFT HEART CATH AND CORONARY ANGIOGRAPHY N/A 05/04/2020   Procedure: LEFT HEART CATH AND CORONARY ANGIOGRAPHY;  Surgeon: Jordan, Peter M, MD;  Location: [facility] INVASIVE CV LAB;  Service: Cardiovascular;  Laterality: N/A;   WISDOM TOOTH EXTRACTION      Family History  Problem Relation Age of Onset   Diabetes Father    Diabetes Sister        prediabetic   Cancer Sister 58  uterine cancer   Hypertension Brother    Diabetes Brother    Seizures Brother    Stroke Maternal Aunt        Multiple aunts died from strokes-maternal   Hypertension Son    Diabetes Son    Colon cancer Neg Hx    Esophageal cancer Neg Hx    Pancreatic cancer Neg Hx    Stomach cancer Neg Hx     Social History   Socioeconomic History   Marital status: Single    Spouse name: Not on file   Number of children: 2   Years of education: 12+   Highest education level: Some college, no degree  Occupational History   Occupation: Print Production Planner    Comment: Retired now  Tobacco Use   Smoking status: Never    Passive exposure: Never   Smokeless tobacco: Never  Vaping Use   Vaping status: Never Used  Substance and Sexual Activity   Alcohol use: Not Currently   Drug use: No   Sexual activity: Not Currently    Birth control/protection: Surgical  Other Topics Concern   Not on file   Social History Narrative   Did get some post high school training/education   Lives alone   Wilder 2 great grandchildren frequently   Daughter lives in [place] and checks in regularly.   Social Drivers of Corporate Investment Banker Strain: Low Risk  (04/18/2023)   Overall Financial Resource Strain (CARDIA)    Difficulty of Paying Living Expenses: Not hard at all  Food Insecurity: No Food Insecurity (04/18/2023)   Hunger Vital Sign    Worried About Running Out of Food in the Last Year: Never true    Ran Out of Food in the Last Year: Never true  Transportation Needs: No Transportation Needs (04/18/2023)   PRAPARE - Administrator, Civil Service (Medical): No    Lack of Transportation (Non-Medical): No  Physical Activity: Unknown (08/15/2021)   Received from [facility]   Exercise Vital Sign    On average, how many days per week do you engage in moderate to strenuous exercise (like a brisk walk)?: Patient declined    Minutes of Exercise per Session: Not on file  Stress: Unknown (08/15/2021)   Received from [facility] Occupational [facility] - Occupational Stress Questionnaire    Feeling of Stress : Patient declined  Social Connections: Unknown (03/17/2022)   Received from [facility]   Social Network    Social Network: Not on file  Intimate Partner Violence: Not At Risk (04/18/2023)   Humiliation, Afraid, Rape, and Kick questionnaire    Fear of Current or Ex-Partner: No    Emotionally Abused: No    Physically Abused: No    Sexually Abused: No      Review of systems: All other review of systems negative except as mentioned in the HPI.   Physical Exam: Vitals:   07/01/24 0844  BP: 134/84  Pulse: 73  SpO2: 96%   Body mass index is 31.41 kg/m. Gen:      No acute distress HEENT:  sclera anicteric CV: s1s2 rrr, no murmur Lungs: B/l clear. Abd:      soft, non-tender; no palpable masses, no distension Ext:    No edema Neuro:  alert and oriented x 3 Psych: normal mood and affect  Data Reviewed:  Reviewed labs, radiology imaging, old records and pertinent past GI work up     Assessment and Plan Assessment &  Plan Gastroesophageal reflux disease (GERD) with persistent chest pain Intermittent chest pain for almost two years, primarily nocturnal, with occasional daytime episodes. Previous cardiac workup was normal, indicating a non-cardiac origin. Omeprazole  at a low dose was ineffective. Suspected GERD with possible esophagitis or ulceration due to acid reflux, with potential for esophageal damage or stricture. - Prescribe pantoprazole  40 mg twice daily. - Schedule upper endoscopy to evaluate esophageal lining, check for infection, changes, or stricture, and perform dilation if necessary. - Educate on using a wedge pillow to elevate the upper body during sleep.  Dysphagia to liquids and solids Difficulty swallowing both liquids and solids, more frequent with liquids. - Consider esophageal dilation if stricture is identified. The risks and benefits as well as alternatives of endoscopic procedure(s) have been discussed and reviewed. All questions answered. The patient agrees to proceed.   Paraumbilical hernia Presence of a paraumbilical hernia causing lower abdominal discomfort. No current bowel obstruction or significant complications. Hernia is reducible with manual pressure. Surgical repair considered if symptoms worsen or obstruction occurs. - Instruct on manual reduction of hernia before bedtime. - Monitor hernia for signs of obstruction or increased pain, consider surgical repair if symptoms worsen.  Constipation Chronic constipation managed with Senokot. - Recommend Miralax , one capful daily, adjust dose as needed. - Continue Senokot as needed.       The patient was provided an opportunity to ask questions and all were answered. The patient agreed with the plan and demonstrated an understanding of  the instructions.  LOIS Wilkie Mcgee , MD    CC: Adella Norris, MD    "

## 2024-07-02 ENCOUNTER — Encounter: Payer: Self-pay | Admitting: Gastroenterology

## 2024-07-03 ENCOUNTER — Encounter: Payer: Self-pay | Admitting: Gastroenterology

## 2024-07-03 ENCOUNTER — Ambulatory Visit: Admitting: Gastroenterology

## 2024-07-03 VITALS — BP 142/81 | HR 78 | Temp 97.2°F | Resp 12 | Ht 61.0 in | Wt 166.0 lb

## 2024-07-03 DIAGNOSIS — R1310 Dysphagia, unspecified: Secondary | ICD-10-CM | POA: Diagnosis not present

## 2024-07-03 DIAGNOSIS — K219 Gastro-esophageal reflux disease without esophagitis: Secondary | ICD-10-CM

## 2024-07-03 DIAGNOSIS — K2289 Other specified disease of esophagus: Secondary | ICD-10-CM | POA: Diagnosis not present

## 2024-07-03 DIAGNOSIS — R0789 Other chest pain: Secondary | ICD-10-CM

## 2024-07-03 DIAGNOSIS — K449 Diaphragmatic hernia without obstruction or gangrene: Secondary | ICD-10-CM | POA: Diagnosis not present

## 2024-07-03 DIAGNOSIS — K2100 Gastro-esophageal reflux disease with esophagitis, without bleeding: Secondary | ICD-10-CM | POA: Diagnosis not present

## 2024-07-03 MED ORDER — SODIUM CHLORIDE 0.9 % IV SOLN: 500.0000 mL | Freq: Once | INTRAVENOUS | Status: DC

## 2024-07-03 NOTE — Progress Notes (Signed)
 Vss nad trans to pacu

## 2024-07-03 NOTE — Progress Notes (Signed)
 Called to room to assist during endoscopic procedure.  Patient ID and intended procedure confirmed with present staff. Received instructions for my participation in the procedure from the performing physician.

## 2024-07-03 NOTE — Progress Notes (Signed)
 Please refer to office visit note 07/01/24. No additional changes in H&P Patient is appropriate for planned procedure(s) and anesthesia in an ambulatory setting  K. Veena Aala Ransom , MD [phone]

## 2024-07-03 NOTE — Patient Instructions (Signed)
 Soft diet today then resume previous diet.  Continue present medications. Awaiting pathology results. Follow antireflux regimen. Use Protonix  (pantoprazole ) 40 mg by mouth twice daily. Follow up appt scheduled. Handout provided on hiatal hernia.   YOU HAD AN ENDOSCOPIC PROCEDURE TODAY AT THE [facility]:   Refer to the procedure report that was given to you for any specific questions about what was found during the examination.  If the procedure report does not answer your questions, please call your gastroenterologist to clarify.  If you requested that your care partner not be given the details of your procedure findings, then the procedure report has been included in a sealed envelope for you to review at your convenience later.  YOU SHOULD EXPECT: Some feelings of bloating in the abdomen. Passage of more gas than usual.  Walking can help get rid of the air that was put into your GI tract during the procedure and reduce the bloating. If you had a lower endoscopy (such as a colonoscopy or flexible sigmoidoscopy) you may notice spotting of blood in your stool or on the toilet paper. If you underwent a bowel prep for your procedure, you may not have a normal bowel movement for a few days.  Please Note:  You might notice some irritation and congestion in your nose or some drainage.  This is from the oxygen used during your procedure.  There is no need for concern and it should clear up in a day or so.  SYMPTOMS TO REPORT IMMEDIATELY:  Following upper endoscopy (EGD)  Vomiting of blood or coffee ground material  New chest pain or pain under the shoulder blades  Painful or persistently difficult swallowing  New shortness of breath  Fever of 100F or higher  Black, tarry-looking stools  For urgent or emergent issues, a gastroenterologist can be reached at any hour by calling [phone]. Do not use MyChart messaging for urgent concerns.    DIET:  We do recommend a small meal at  first, but then you may proceed to your regular diet.  Drink plenty of fluids but you should avoid alcoholic beverages for 24 hours.  ACTIVITY:  You should plan to take it easy for the rest of today and you should NOT DRIVE or use heavy machinery until tomorrow (because of the sedation medicines used during the test).    FOLLOW UP: Our staff will call the number listed on your records the next business day following your procedure.  We will call around 7:15- 8:00 am to check on you and address any questions or concerns that you may have regarding the information given to you following your procedure. If we do not reach you, we will leave a message.     If any biopsies were taken you will be contacted by phone or by letter within the next 1-3 weeks.  Please call us  at [phone] if you have not heard about the biopsies in 3 weeks.    SIGNATURES/CONFIDENTIALITY: You and/or your care partner have signed paperwork which will be entered into your electronic medical record.  These signatures attest to the fact that that the information above on your After Visit Summary has been reviewed and is understood.  Full responsibility of the confidentiality of this discharge information lies with you and/or your care-partner.

## 2024-07-03 NOTE — Op Note (Signed)
 [facility] Patient Name: Haley [name] Procedure Date: 07/03/2024 12:15 PM MRN: [id number] Endoscopist: Gustav ALONSO Mcgee , MD, [id number] Age: 68 Referring MD:  Date of Birth: 06/10/1956 Gender: Female Account #: [id number] Procedure:                Upper GI endoscopy Indications:              Dysphagia, Suspected esophageal reflux, Chest pain                            (non cardiac) Medicines:                Monitored Anesthesia Care Procedure:                Pre-Anesthesia Assessment:                           - Prior to the procedure, a History and Physical                            was performed, and patient medications and                            allergies were reviewed. The patient's tolerance of                            previous anesthesia was also reviewed. The risks                            and benefits of the procedure and the sedation                            options and risks were discussed with the patient.                            All questions were answered, and informed consent                            was obtained. Prior Anticoagulants: The patient has                            taken no anticoagulant or antiplatelet agents. ASA                            Grade Assessment: III - A patient with severe                            systemic disease. After reviewing the risks and                            benefits, the patient was deemed in satisfactory                            condition to undergo the procedure.  After obtaining informed consent, the endoscope was                            passed under direct vision. Throughout the                            procedure, the patient's blood pressure, pulse, and                            oxygen saturations were monitored continuously. The                            GIF [id number] #[id number] was introduced through the                            mouth, and advanced to the  second part of duodenum.                            The upper GI endoscopy was accomplished without                            difficulty. The patient tolerated the procedure                            well. Scope In: Scope Out: Findings:                 One benign-appearing, intrinsic mild stenosis was                            found 35 to 36 cm from the incisors. This stenosis                            measured 1.8 cm (inner diameter) x less than one cm                            (in length). The stenosis was traversed. A TTS                            dilator was passed through the scope. Dilation with                            an 18-19-20 mm x 8 cm CRE balloon dilator was                            performed to 20 mm. The dilation site was examined                            following endoscope reinsertion and showed mild                            mucosal disruption. Biopsies were obtained from the  proximal and distal esophagus with cold forceps for                            histology of suspected eosinophilic esophagitis.                           A small hiatal hernia was present.                           The stomach was normal.                           The cardia and gastric fundus were normal on                            retroflexion.                           The examined duodenum was normal. Complications:            No immediate complications. Estimated Blood Loss:     Estimated blood loss was minimal. Impression:               - Benign-appearing esophageal stenosis. Dilated.                           - Small hiatal hernia.                           - Normal stomach.                           - Normal examined duodenum.                           - Biopsies were taken with a cold forceps for                            evaluation of eosinophilic esophagitis. Recommendation:           - Resume previous diet.                           -  Continue present medications.                           - Await pathology results.                           - Follow an antireflux regimen.                           - Use Protonix  (pantoprazole ) 40 mg PO BID.                           - Return to GI office in 2 months. Haley [name] V. Haley Andrew, MD 07/03/2024 1:04:10 PM This report has been signed electronically.

## 2024-07-04 ENCOUNTER — Telehealth: Payer: Self-pay | Admitting: *Deleted

## 2024-07-04 NOTE — Telephone Encounter (Signed)
  Follow up Call-     07/03/2024   11:08 AM  Call back number  Post procedure Call Back phone  # [phone]  Permission to leave phone message Yes     Patient questions:  Do you have a fever, pain , or abdominal swelling? No. Pain Score  0 *  Have you tolerated food without any problems? Yes  Have you been able to return to your normal activities? Yes.    Do you have any questions about your discharge instructions: Diet   No. Medications  No. Follow up visit  No.  Do you have questions or concerns about your Care? No.  Actions: * If pain score is 4 or above: No action needed, pain <4.

## 2024-07-07 DIAGNOSIS — Z5189 Encounter for other specified aftercare: Secondary | ICD-10-CM | POA: Insufficient documentation

## 2024-07-08 ENCOUNTER — Other Ambulatory Visit: Payer: Self-pay

## 2024-07-08 LAB — SURGICAL PATHOLOGY

## 2024-07-08 MED ORDER — NITROGLYCERIN 0.4 MG SL SUBL: SUBLINGUAL_TABLET | SUBLINGUAL | 1 refills | Status: AC

## 2024-07-25 ENCOUNTER — Telehealth: Payer: Self-pay

## 2024-07-25 ENCOUNTER — Inpatient Hospital Stay (HOSPITAL_BASED_OUTPATIENT_CLINIC_OR_DEPARTMENT_OTHER)
Admission: EM | Admit: 2024-07-25 | Discharge: 2024-08-01 | DRG: 378 | Disposition: A | Discharge status: Home Health | Source: Ambulatory Visit | Attending: Internal Medicine | Admitting: Internal Medicine

## 2024-07-25 ENCOUNTER — Other Ambulatory Visit: Payer: Self-pay

## 2024-07-25 ENCOUNTER — Ambulatory Visit: Admission: EM | Admit: 2024-07-25 | Discharge: 2024-07-25 | Disposition: A | Discharge status: Home / Self Care

## 2024-07-25 ENCOUNTER — Encounter: Payer: Self-pay | Admitting: Emergency Medicine

## 2024-07-25 DIAGNOSIS — Z6831 Body mass index (BMI) 31.0-31.9, adult: Secondary | ICD-10-CM | POA: Diagnosis not present

## 2024-07-25 DIAGNOSIS — Z66 Do not resuscitate: Secondary | ICD-10-CM | POA: Diagnosis present

## 2024-07-25 DIAGNOSIS — G4730 Sleep apnea, unspecified: Secondary | ICD-10-CM | POA: Diagnosis present

## 2024-07-25 DIAGNOSIS — Z888 Allergy status to other drugs, medicaments and biological substances status: Secondary | ICD-10-CM

## 2024-07-25 DIAGNOSIS — Z823 Family history of stroke: Secondary | ICD-10-CM | POA: Diagnosis not present

## 2024-07-25 DIAGNOSIS — Y92230 Patient room in hospital as the place of occurrence of the external cause: Secondary | ICD-10-CM | POA: Diagnosis not present

## 2024-07-25 DIAGNOSIS — K921 Melena: Principal | ICD-10-CM

## 2024-07-25 DIAGNOSIS — Z9049 Acquired absence of other specified parts of digestive tract: Secondary | ICD-10-CM | POA: Diagnosis not present

## 2024-07-25 DIAGNOSIS — I10 Essential (primary) hypertension: Secondary | ICD-10-CM | POA: Diagnosis present

## 2024-07-25 DIAGNOSIS — E66811 Obesity, class 1: Secondary | ICD-10-CM | POA: Diagnosis present

## 2024-07-25 DIAGNOSIS — E861 Hypovolemia: Secondary | ICD-10-CM | POA: Diagnosis present

## 2024-07-25 DIAGNOSIS — Z8719 Personal history of other diseases of the digestive system: Secondary | ICD-10-CM

## 2024-07-25 DIAGNOSIS — K922 Gastrointestinal hemorrhage, unspecified: Secondary | ICD-10-CM | POA: Diagnosis not present

## 2024-07-25 DIAGNOSIS — Z79899 Other long term (current) drug therapy: Secondary | ICD-10-CM

## 2024-07-25 DIAGNOSIS — Z8049 Family history of malignant neoplasm of other genital organs: Secondary | ICD-10-CM

## 2024-07-25 DIAGNOSIS — N179 Acute kidney failure, unspecified: Secondary | ICD-10-CM | POA: Diagnosis present

## 2024-07-25 DIAGNOSIS — Z833 Family history of diabetes mellitus: Secondary | ICD-10-CM

## 2024-07-25 DIAGNOSIS — R55 Syncope and collapse: Secondary | ICD-10-CM | POA: Diagnosis present

## 2024-07-25 DIAGNOSIS — R269 Unspecified abnormalities of gait and mobility: Secondary | ICD-10-CM | POA: Diagnosis present

## 2024-07-25 DIAGNOSIS — Z8673 Personal history of transient ischemic attack (TIA), and cerebral infarction without residual deficits: Secondary | ICD-10-CM

## 2024-07-25 DIAGNOSIS — K641 Second degree hemorrhoids: Secondary | ICD-10-CM | POA: Diagnosis present

## 2024-07-25 DIAGNOSIS — D649 Anemia, unspecified: Secondary | ICD-10-CM | POA: Diagnosis not present

## 2024-07-25 DIAGNOSIS — K5731 Diverticulosis of large intestine without perforation or abscess with bleeding: Principal | ICD-10-CM | POA: Diagnosis present

## 2024-07-25 DIAGNOSIS — W06XXXA Fall from bed, initial encounter: Secondary | ICD-10-CM | POA: Diagnosis not present

## 2024-07-25 DIAGNOSIS — I25119 Atherosclerotic heart disease of native coronary artery with unspecified angina pectoris: Secondary | ICD-10-CM | POA: Diagnosis present

## 2024-07-25 DIAGNOSIS — E785 Hyperlipidemia, unspecified: Secondary | ICD-10-CM | POA: Diagnosis present

## 2024-07-25 DIAGNOSIS — R933 Abnormal findings on diagnostic imaging of other parts of digestive tract: Secondary | ICD-10-CM | POA: Diagnosis not present

## 2024-07-25 DIAGNOSIS — Z8249 Family history of ischemic heart disease and other diseases of the circulatory system: Secondary | ICD-10-CM

## 2024-07-25 DIAGNOSIS — D62 Acute posthemorrhagic anemia: Secondary | ICD-10-CM | POA: Diagnosis present

## 2024-07-25 DIAGNOSIS — K5791 Diverticulosis of intestine, part unspecified, without perforation or abscess with bleeding: Secondary | ICD-10-CM | POA: Diagnosis not present

## 2024-07-25 DIAGNOSIS — K625 Hemorrhage of anus and rectum: Secondary | ICD-10-CM | POA: Diagnosis not present

## 2024-07-25 DIAGNOSIS — Z7982 Long term (current) use of aspirin: Secondary | ICD-10-CM

## 2024-07-25 DIAGNOSIS — K2100 Gastro-esophageal reflux disease with esophagitis, without bleeding: Secondary | ICD-10-CM | POA: Diagnosis present

## 2024-07-25 DIAGNOSIS — Z90710 Acquired absence of both cervix and uterus: Secondary | ICD-10-CM | POA: Diagnosis not present

## 2024-07-25 DIAGNOSIS — K5730 Diverticulosis of large intestine without perforation or abscess without bleeding: Secondary | ICD-10-CM | POA: Diagnosis not present

## 2024-07-25 DIAGNOSIS — K920 Hematemesis: Secondary | ICD-10-CM | POA: Diagnosis not present

## 2024-07-25 DIAGNOSIS — Z531 Procedure and treatment not carried out because of patient's decision for reasons of belief and group pressure: Secondary | ICD-10-CM | POA: Diagnosis present

## 2024-07-25 DIAGNOSIS — K5900 Constipation, unspecified: Secondary | ICD-10-CM | POA: Diagnosis present

## 2024-07-25 DIAGNOSIS — E119 Type 2 diabetes mellitus without complications: Secondary | ICD-10-CM | POA: Diagnosis present

## 2024-07-25 LAB — URINALYSIS, ROUTINE W REFLEX MICROSCOPIC
Bacteria, UA: NONE SEEN
Glucose, UA: NEGATIVE mg/dL
Nitrite: NEGATIVE
Protein, ur: 30 mg/dL — AB
Specific Gravity, Urine: 1.028 (ref 1.005–1.030)
pH: 5.5 (ref 5.0–8.0)

## 2024-07-25 LAB — PROTIME-INR
INR: 1 (ref 0.8–1.2)
Prothrombin Time: 13.9 s (ref 11.4–15.2)

## 2024-07-25 LAB — TROPONIN T, HIGH SENSITIVITY: Troponin T High Sensitivity: <15 ng/L (ref 0–19)

## 2024-07-25 LAB — COMPREHENSIVE METABOLIC PANEL WITH GFR
ALT: 23 U/L (ref 0–44)
AST: 19 U/L (ref 15–41)
Albumin: 3.9 g/dL (ref 3.5–5.0)
Alkaline Phosphatase: 53 U/L (ref 38–126)
Anion gap: 12 (ref 5–15)
BUN: 18 mg/dL (ref 8–23)
CO2: 21 mmol/L — ABNORMAL LOW (ref 22–32)
Calcium: 9.9 mg/dL (ref 8.9–10.3)
Chloride: 106 mmol/L (ref 98–111)
Creatinine, Ser: 1.34 mg/dL — ABNORMAL HIGH (ref 0.44–1.00)
GFR, Estimated: 42 mL/min — ABNORMAL LOW (ref >60)
Glucose, Bld: 143 mg/dL — ABNORMAL HIGH (ref 70–99)
Potassium: 3.8 mmol/L (ref 3.5–5.1)
Sodium: 139 mmol/L (ref 135–145)
Total Bilirubin: 0.4 mg/dL (ref 0.0–1.2)
Total Protein: 6.7 g/dL (ref 6.5–8.1)

## 2024-07-25 LAB — CBC
HCT: 32.9 % — ABNORMAL LOW (ref 36.0–46.0)
Hemoglobin: 10.9 g/dL — ABNORMAL LOW (ref 12.0–15.0)
MCH: 31.3 pg (ref 26.0–34.0)
MCHC: 33.1 g/dL (ref 30.0–36.0)
MCV: 94.5 fL (ref 80.0–100.0)
Platelets: 280 K/uL (ref 150–400)
RBC: 3.48 MIL/uL — ABNORMAL LOW (ref 3.87–5.11)
RDW: 13 % (ref 11.5–15.5)
WBC: 6.3 K/uL (ref 4.0–10.5)
nRBC: 0 % (ref 0.0–0.2)

## 2024-07-25 LAB — GLUCOSE, CAPILLARY: Glucose-Capillary: 104 mg/dL — ABNORMAL HIGH (ref 70–99)

## 2024-07-25 LAB — OCCULT BLOOD X 1 CARD TO LAB, STOOL: Fecal Occult Bld: POSITIVE — AB

## 2024-07-25 LAB — HEMOGLOBIN AND HEMATOCRIT, BLOOD
HCT: 25.1 % — ABNORMAL LOW (ref 36.0–46.0)
Hemoglobin: 7.9 g/dL — ABNORMAL LOW (ref 12.0–15.0)

## 2024-07-25 MED ORDER — MELATONIN 5 MG PO TABS: 5.0000 mg | ORAL_TABLET | Freq: Every evening | ORAL | Status: DC | PRN

## 2024-07-25 MED ORDER — PANTOPRAZOLE SODIUM 40 MG IV SOLR
40.0000 mg | Freq: Two times a day (BID) | INTRAVENOUS | Status: DC
  Administered 2024-07-26 – 2024-07-29 (×9): 40 mg via INTRAVENOUS
  Filled 2024-07-25 (×9): qty 10

## 2024-07-25 MED ORDER — ACETAMINOPHEN 325 MG PO TABS
650.0000 mg | ORAL_TABLET | Freq: Four times a day (QID) | ORAL | Status: DC | PRN
  Administered 2024-07-28 – 2024-07-31 (×2): 650 mg via ORAL
  Filled 2024-07-25 (×2): qty 2

## 2024-07-25 MED ORDER — POLYETHYLENE GLYCOL 3350 17 G PO PACK: 17.0000 g | PACK | Freq: Every day | ORAL | Status: DC | PRN

## 2024-07-25 MED ORDER — SODIUM CHLORIDE 0.9 % IV SOLN: INTRAVENOUS | Status: AC

## 2024-07-25 MED ORDER — PROCHLORPERAZINE EDISYLATE 10 MG/2ML IJ SOLN
5.0000 mg | Freq: Four times a day (QID) | INTRAMUSCULAR | Status: DC | PRN
  Filled 2024-07-25: qty 2

## 2024-07-25 MED ORDER — SODIUM CHLORIDE 0.9 % IV BOLUS
1000.0000 mL | Freq: Once | INTRAVENOUS | Status: AC
  Administered 2024-07-25: 1000 mL via INTRAVENOUS

## 2024-07-25 NOTE — ED Provider Notes (Signed)
 [name]    CSN: [id number] Arrival date & time: 07/25/24  0806      History   Chief Complaint Chief Complaint  Patient presents with   Blood In Stools    HPI Haley [name] is a 68 y.o. female.   Patient here for evaluation of blood in stool x last night.  She states she had normal BM but had a lot of blood in her stool.  She denies fatigue, f/c, n/v/d, constipation, abdominal pain, rectal pain.  She denies presyncope/syncope.  She denies use of blood thinners.  She states she feels well, doesn't feel any different.  She had a colonoscopy 4 years ago with severe diverticulosis in the sigmoid colon, in the descending colon, in the transverse colon and in the ascending colon, and she had an endoscopy 3 weeks ago due to GERD and dysphagia, small stricture but no other complications.    Past Medical History:  Diagnosis Date   Back pain    Coronary artery disease    Depression    DM type 2, controlled, with complication (HCC) 07/22/2013   Type II DM diagnosed in ~ 2006 per pt hx.    Environmental and seasonal allergies    Ganglion cyst of wrist, right 05/09/2017   Hyperlipidemia    Hypertension    Itching    Refusal of blood transfusions as patient is Jehovah's Witness    Shingles    Stroke (HCC) 2000   per pt. report:   TIA    Patient Active Problem List   Diagnosis Date Noted   Constipation 05/19/2024   Arthralgia of right knee 01/18/2024   Muscle pain 01/18/2024   Diverticulosis 01/07/2024   Pain of pelvic girdle 04/17/2023   Cold intolerance 04/17/2023   Muscle spasms of lower extremity 04/17/2023   Left foot pain 02/17/2023   Symptomatic mammary hypertrophy 09/23/2021   Plantar fasciitis, right 07/17/2021   Abnormal ear sensation, right 07/13/2021   Low back pain 01/26/2021   Chronic right-sided thoracic back pain 01/26/2021   Coronary artery disease involving native coronary artery of native heart with angina pectoris (HCC) 05/04/2020    Diabetes mellitus without complication (HCC) 03/03/2020   Environmental and seasonal allergies 03/03/2020   Right lumbar radiculopathy 01/19/2019   Chronic pain of right knee 01/19/2019   Cutaneous abscess of abdominal wall 06/25/2018   Ganglion cyst of wrist, right 05/09/2017   Coronary artery disease involving native coronary artery of native heart without angina pectoris    Abnormal screening cardiac CT 10/16/2016   Hyperlipidemia 08/03/2016   Chest pain 08/03/2016   Onychomycosis of toenail 04/18/2016   GERD (gastroesophageal reflux disease) 02/27/2016   Depression 02/27/2016   Palpitation 03/19/2014   Dyspnea on exertion 03/19/2014   Fatigue 03/19/2014   Essential hypertension 07/22/2013   Obesity 07/22/2013   DM type 2, controlled, with complication (HCC) 07/22/2013   Allergic rhinitis 07/22/2013   Stroke (HCC) 2000    Past Surgical History:  Procedure Laterality Date   ABDOMINAL HYSTERECTOMY  1994   Laparoscopic; ovaries still intact.  Performed for prolapsed uterus.   AREOLA/NIPPLE RECONSTRUCTION WITH GRAFT Bilateral 09/19/2021   Procedure: FREE NIPPLE GRAFT;  Surgeon: Elisabeth Craig RAMAN, MD;  Location: [facility] OR;  Service: Plastics;  Laterality: Bilateral;   BREAST REDUCTION SURGERY Bilateral 09/19/2021   Procedure: Bilateral Breast Reduction;  Surgeon: Elisabeth Craig RAMAN, MD;  Location: [facility] OR;  Service: Plastics;  Laterality: Bilateral;  2 hours   CARDIAC CATHETERIZATION N/A 10/23/2016  Procedure: Left Heart Cath and Coronary Angiography;  Surgeon: Lonni JONETTA Cash, MD;  Location: [facility] INVASIVE CV LAB;  Service: Cardiovascular;  Laterality: N/A;   CHOLECYSTECTOMY  1997   Laparoscopic   COLONOSCOPY  2005-at age 35   Hyperplastic polyp--[place]   LEFT HEART CATH AND CORONARY ANGIOGRAPHY N/A 05/04/2020   Procedure: LEFT HEART CATH AND CORONARY ANGIOGRAPHY;  Surgeon: [place], Peter M, MD;  Location: [facility] INVASIVE CV LAB;  Service: Cardiovascular;  Laterality: N/A;   WISDOM  TOOTH EXTRACTION      OB History     Gravida  2   Para  2   Term  2   Preterm      AB      Living  2      SAB      IAB      Ectopic      Multiple      Live Births               Home Medications    Prior to Admission medications   Medication Sig Start Date End Date Taking? Authorizing Provider  ACCU-CHEK GUIDE TEST test strip SMARTSIG:Strip(s) 07/14/24  Yes [provider]  Alcohol Swabs (PHARMACIST CHOICE ALCOHOL) PADS  07/14/24  Yes [provider]  aspirin  81 MG tablet Take 81 mg by mouth daily.   Yes [provider]  Blood Glucose Calibration (ACCU-CHEK GUIDE CONTROL) LIQD  07/14/24  Yes [provider]  Calcium  Citrate 250 MG TABS 2 tabs by mouth twice daily. 04/17/23  Yes Adella Norris, MD  Cholecalciferol (DIALYVITE VITAMIN D  5000) 125 MCG (5000 UT) capsule Take 15,000 Units by mouth daily.   Yes [provider]  Cyanocobalamin (B-12 PO) Take 1 tablet by mouth daily.   Yes [provider]  Flaxseed, Linseed, (FLAXSEED OIL) 1000 MG CAPS Take 1,000 mg by mouth daily at 8 pm.   Yes [provider]  Liniments (BLUE-EMU SUPER STRENGTH EX) Apply 1 application topically daily as needed (pain).   Yes [provider]  lisinopril  (ZESTRIL ) 40 MG tablet Take 1 tablet (40 mg total) by mouth daily. 04/07/24  Yes Duke, Jon Garre, PA  metoprolol  succinate (TOPROL -XL) 50 MG 24 hr tablet Take 1 tablet (50 mg total) by mouth as needed (PALPITATIONS). Take with or immediately following a meal. 12/04/23  Yes Acharya, Gayatri A, MD  mometasone  (NASONEX ) 50 MCG/ACT nasal spray USE 2 SPRAY(S) IN EACH NOSTRIL ONCE DAILY 08/15/21  Yes Adella Norris, MD  Multiple Vitamins-Minerals (MULTIVITAMIN WITH MINERALS) tablet Take 1 tablet by mouth daily.   Yes [provider]  nitroGLYCERIN  (NITROSTAT ) 0.4 MG SL tablet DISSOLVE ONE TABLET UNDER THE TONGUE EVERY 5 MINUTES AS NEEDED FOR CHEST PAIN.  DO NOT  EXCEED A TOTAL OF 3 DOSES IN 15 MINUTES 07/08/24  Yes Adella Norris, MD  omega-3 acid ethyl esters (LOVAZA ) 1 g capsule Take 1 capsule (1 g total) by mouth 2 (two) times daily. 05/19/24  Yes Adella Norris, MD  pantoprazole  (PROTONIX ) 40 MG tablet Take 1 tablet (40 mg total) by mouth 2 (two) times daily before a meal. 07/01/24  Yes Nandigam, Kavitha V, MD  Pharmacist Choice Lancets MISC  07/14/24  Yes [provider]  tirzepatide  (MOUNJARO ) 15 MG/0.5ML Pen Inject 15 mg into the skin once a week. 02/11/24  Yes Acharya, Gayatri A, MD  GARLIC PO Take 6,000 mg by mouth daily. Patient not taking: Reported on 07/25/2024    [provider]  Psyllium (METAMUCIL 3 IN 1 DAILY FIBER) 400 MG CAPS Use as directed daily with 8 oz water Patient not taking: Reported on 07/25/2024 03/20/24   Adella Norris, MD  vitamin C (ASCORBIC ACID) 500 MG tablet Take 1,000 mg by mouth daily. Patient not taking: Reported on 07/25/2024    [provider]  zinc gluconate 50 MG tablet Take 50 mg by mouth daily. Patient not taking: Reported on 07/25/2024    [provider]    Family History Family History  Problem Relation Age of Onset   Diabetes Father    Diabetes Sister        prediabetic   Cancer Sister 67       uterine cancer   Hypertension Brother    Diabetes Brother    Seizures Brother    Stroke Maternal Aunt        Multiple aunts died from strokes-maternal   Hypertension Son    Diabetes Son    Colon cancer Neg Hx    Esophageal cancer Neg Hx    Pancreatic cancer Neg Hx    Stomach cancer Neg Hx     Social History Social History   Tobacco Use   Smoking status: Never    Passive exposure: Never   Smokeless tobacco: Never  Vaping Use   Vaping status: Never Used  Substance Use Topics   Alcohol use: Not Currently   Drug use: No     Allergies   Praluent  [alirocumab ], Zetia [ezetimibe], Atorvastatin , Pravastatin , Repatha  [evolocumab ], and Rosuvastatin    Review  of Systems Review of Systems  Constitutional:  Negative for chills, fatigue and fever.  Respiratory:  Negative for cough, shortness of breath and wheezing.   Gastrointestinal:  Positive for blood in stool. Negative for abdominal distention, abdominal pain, constipation, diarrhea, nausea, rectal pain and vomiting.  Musculoskeletal:  Negative for arthralgias and myalgias.  Skin:  Negative for rash.  Neurological:  Negative for dizziness, syncope, speech difficulty, weakness, light-headedness and headaches.  Hematological:  Negative for adenopathy. Does not bruise/bleed easily.  Psychiatric/Behavioral:  Negative for confusion and sleep disturbance.      Physical Exam Triage Vital Signs ED Triage Vitals [07/25/24 0826]  Encounter Vitals Group     BP 125/84     Girls Systolic BP Percentile      Girls Diastolic BP Percentile      Boys Systolic BP Percentile      Boys Diastolic BP Percentile      Pulse Rate 71     Resp 14     Temp 98 F (36.7 C)     Temp Source Oral     SpO2 97 %     Weight      Height      Head Circumference      Peak Flow      Pain Score 3     Pain Loc      Pain Education      Exclude from Growth Chart    No data found.  Updated Vital Signs BP 125/84 (BP Location: Left Arm)   Pulse 71   Temp 98 F (36.7 C) (Oral)   Resp 14   SpO2 97%   Visual Acuity Right Eye Distance:   Left Eye Distance:   Bilateral Distance:    Right Eye Near:   Left Eye Near:    Bilateral Near:     Physical Exam Vitals and nursing note reviewed.  Constitutional:      General: She is not  in acute distress.    Appearance: Normal appearance. She is not ill-appearing, toxic-appearing or diaphoretic.  HENT:     Head: Normocephalic and atraumatic.  Eyes:     General: No scleral icterus.    Extraocular Movements: Extraocular movements intact.     Conjunctiva/sclera: Conjunctivae normal.  Pulmonary:     Effort: Pulmonary effort is normal. No respiratory distress.   Abdominal:     Palpations: There is no shifting dullness.     Tenderness: There is no abdominal tenderness. There is no right CVA tenderness, left CVA tenderness, guarding or rebound. Negative signs include Murphy's sign and McBurney's sign.     Hernia: A hernia is present. Hernia is present in the ventral area (reducible).  Musculoskeletal:        General: Normal range of motion.     Cervical back: Normal range of motion. No rigidity.  Skin:    General: Skin is warm.     Coloration: Skin is not jaundiced.     Findings: No rash.  Neurological:     General: No focal deficit present.     Mental Status: She is alert and oriented to person, place, and time.     Cranial Nerves: Cranial nerves 2-12 are intact.     Motor: No weakness.     Gait: Gait normal.  Psychiatric:        Mood and Affect: Mood normal.        Behavior: Behavior normal. Behavior is cooperative.      UC Treatments / Results  Labs (all labs ordered are listed, but only abnormal results are displayed) Labs Reviewed - No data to display   EKG   Radiology No results found.  Procedures Procedures (including critical care time)  Medications Ordered in UC Medications - No data to display  Initial Impression / Assessment and Plan / UC Course  I have reviewed the triage vital signs and the nursing notes.  Pertinent labs & imaging results that were available during my care of the patient were reviewed by me and considered in my medical decision making (see chart for details).   Sent to ED as patient reports large volume of blood in her stool last night and she has history of severe diverticulosis of multiple parts of her large intestine    Final Clinical Impressions(s) / UC Diagnoses   Final diagnoses:  Blood in stool  History of diverticulosis     Discharge Instructions      Go to ED for further evaluation of blood in stool due to history of Severe diverticulosis.     ED Prescriptions    None    PDMP not reviewed this encounter.   Juleen Rush, PA-C 07/25/24 [phone]

## 2024-07-25 NOTE — Telephone Encounter (Signed)
 Have her come in for cbc, PT/INR, PTT and see if she passed a hard stool prior to this happening Addendum:  spoke with patient: No recent heard stool before bleed.  Had another dark bloody stool after called and now a bit light headed, though at a park currently. Only on ASA as platelet She is Jehovah's Witness and states would not accept blood transfusion, also with CAD. She does have extensive diverticular disease and likely is having a diverticular bleed based on symptoms.   Asked her to go to ED immediately for evaluation and management of the bleed.   This is Dr. Landry Bolds entering this

## 2024-07-25 NOTE — Telephone Encounter (Signed)
 Blood in stool since yesterday evening. Blood is dark red and notices toilet filled with red blood. No pain during bowel movement. Has burning pain on area near her right hip which radiates from inside her body out. Has not taken any medication. Has not happened any other time. Still blood in stool in morning although not as much.

## 2024-07-25 NOTE — ED Notes (Signed)
 Patient is being discharged from the Urgent Care and sent to the Emergency Department via POV . Per Juleen, MD, patient is in need of higher level of care due to need for further evaluation of bloody stools with hx of diverticulosis. Patient is aware and verbalizes understanding of plan of care.  Vitals:   07/25/24 0826  BP: 125/84  Pulse: 71  Resp: 14  Temp: 98 F (36.7 C)  SpO2: 97%

## 2024-07-25 NOTE — ED Triage Notes (Signed)
 Patient states large amounts of dark red blood in stool yesterday. States syncopal episode after going to urgent care today. Denies blood thinners.

## 2024-07-25 NOTE — Discharge Instructions (Addendum)
 Go to ED for further evaluation of blood in stool due to history of Severe diverticulosis.

## 2024-07-25 NOTE — ED Triage Notes (Addendum)
 Pt reports noticing large amounts of dark red blood in stool yesterday. Pt reports 4 total stools yesterday with so much blood in the toilet that she could not see the stool and blood on the toilet tissue. Loose stools are her normal baseline. Denies abdominal pain, diarrhea, constipation, rectal pain, nausea, or vomiting. Hx of hemorrhoids, but none bothering her currently. No recent NSAID use. Last colonoscopy ~ 60yrs ago, no abnormal findings. 1 BM this morning with significantly less blood noticed by pt.   Pt is on mounjaro  injections, last dose 07/16/24.

## 2024-07-25 NOTE — ED Provider Notes (Signed)
 [place] EMERGENCY DEPARTMENT AT [facility] Provider Note   CSN: [id number] Arrival date & time: 07/25/24  1310     Patient presents with: Rectal Bleeding   Haley [name] is a 68 y.o. female.    Rectal Bleeding   68 year old female presents emergency department with concern for rectal bleeding.  States over the past 2 to 3 days, has noticed dark red blood in her stool.  Reports a few episodes a couple days ago, 6 episodes yesterday and 2-3 episodes today.  Denies any blood thinner use.  States she has history of external hemorrhoids but feels as if they are not irritated.  Denies any abdominal or rectal pain.  States that today while walking in downtown [place], was having some chest tightness and shortness of breath.  Sat down to rest 15 minutes.  When she stood back up to begin walking, felt lightheaded felt as if she were going to pass out.  States that she went down to her knees and subsequently lost consciousness for a few seconds before regaining consciousness.  Brought by family members.  States that she has not had symptoms like this before.  Past medical history significant for diabetes mellitus, CVA, CAD, hyperlipidemia, hypertension, GERD  Prior to Admission medications   Medication Sig Start Date End Date Taking? Authorizing Provider  ACCU-CHEK GUIDE TEST test strip SMARTSIG:Strip(s) 07/14/24   [provider]  Alcohol Swabs (PHARMACIST CHOICE ALCOHOL) PADS  07/14/24   [provider]  aspirin  81 MG tablet Take 81 mg by mouth daily.    [provider]  Blood Glucose Calibration (ACCU-CHEK GUIDE CONTROL) LIQD  07/14/24   [provider]  Calcium  Citrate 250 MG TABS 2 tabs by mouth twice daily. 04/17/23   Adella Norris, MD  Cholecalciferol (DIALYVITE VITAMIN D  5000) 125 MCG (5000 UT) capsule Take 15,000 Units by mouth daily.    [provider]  Cyanocobalamin (B-12 PO) Take 1 tablet by mouth daily.    [provider]  Flaxseed, Linseed, (FLAXSEED OIL) 1000 MG CAPS Take 1,000 mg by mouth daily at 8 pm.    [provider]  GARLIC PO Take 6,000 mg by mouth daily. Patient not taking: Reported on 07/25/2024    [provider]  Liniments (BLUE-EMU SUPER STRENGTH EX) Apply 1 application topically daily as needed (pain).    [provider]  lisinopril  (ZESTRIL ) 40 MG tablet Take 1 tablet (40 mg total) by mouth daily. 04/07/24   Duke, Jon Garre, PA  metoprolol  succinate (TOPROL -XL) 50 MG 24 hr tablet Take 1 tablet (50 mg total) by mouth as needed (PALPITATIONS). Take with or immediately following a meal. 12/04/23   Loni Soyla LABOR, MD  mometasone  (NASONEX ) 50 MCG/ACT nasal spray USE 2 SPRAY(S) IN EACH NOSTRIL ONCE DAILY 08/15/21   Adella Norris, MD  Multiple Vitamins-Minerals (MULTIVITAMIN WITH MINERALS) tablet Take 1 tablet by mouth daily.    [provider]  nitroGLYCERIN  (NITROSTAT ) 0.4 MG SL tablet DISSOLVE ONE TABLET UNDER THE TONGUE EVERY 5 MINUTES AS NEEDED FOR CHEST PAIN.  DO NOT EXCEED A TOTAL OF 3 DOSES IN 15 MINUTES 07/08/24   Adella Norris, MD  omega-3 acid ethyl esters (LOVAZA ) 1 g capsule Take 1 capsule (1 g total) by mouth 2 (two) times daily. 05/19/24   Adella Norris, MD  pantoprazole  (PROTONIX ) 40 MG tablet Take 1 tablet (40 mg total) by mouth 2 (two) times daily before a meal. 07/01/24   Nandigam, Kavitha V, MD  Pharmacist Choice Lancets MISC  07/14/24   [provider]  Psyllium (METAMUCIL 3 IN 1 DAILY FIBER) 400 MG CAPS Use as directed daily with 8 oz water Patient not taking: Reported on 07/25/2024 03/20/24   Adella Norris, MD  tirzepatide  (MOUNJARO ) 15 MG/0.5ML Pen Inject 15 mg into the skin once a week. 02/11/24   Acharya, Gayatri A, MD  vitamin C (ASCORBIC ACID) 500 MG tablet Take 1,000 mg by mouth daily. Patient not taking: Reported on 07/25/2024    [provider]  zinc gluconate 50 MG tablet Take 50 mg by  mouth daily. Patient not taking: Reported on 07/25/2024    [provider]    Allergies: Praluent  [alirocumab ], Zetia [ezetimibe], Atorvastatin , Pravastatin , Repatha  [evolocumab ], and Rosuvastatin     Review of Systems  Gastrointestinal:  Positive for hematochezia.  All other systems reviewed and are negative.   Updated Vital Signs BP (!) 89/65   Pulse 79   Temp 97.9 F (36.6 C)   Resp 17   SpO2 100%   Physical Exam Vitals and nursing note reviewed. Exam conducted with a chaperone present.  Constitutional:      General: She is not in acute distress.    Appearance: She is well-developed.  HENT:     Head: Normocephalic and atraumatic.  Eyes:     Conjunctiva/sclera: Conjunctivae normal.  Cardiovascular:     Rate and Rhythm: Normal rate and regular rhythm.     Heart sounds: No murmur heard. Pulmonary:     Effort: Pulmonary effort is normal. No respiratory distress.     Breath sounds: Normal breath sounds.  Abdominal:     Palpations: Abdomen is soft.     Tenderness: There is no abdominal tenderness.  Genitourinary:    Comments: External hemorrhoid present nonthrombosed does not appear to be source of bleeding.  Internal exam showed dark red appearing stool with minimal bright red blood. Musculoskeletal:        General: No swelling.     Cervical back: Neck supple.     Comments: No midline tenderness cervical, thoracic, lumbar spine without step-off or deformity.  No chest wall tenderness.  Abrasion left anterior knee without obvious bony tenderness bilateral upper lower extremities.  Skin:    General: Skin is warm and dry.     Capillary Refill: Capillary refill takes less than 2 seconds.  Neurological:     Mental Status: She is alert.     Comments: Alert and oriented to self, place, time and event.   Speech is fluent, clear without dysarthria or dysphasia.   Strength symmetric in upper/lower extremities   Sensation intact in upper/lower extremities   Able to  ambulate independently. CN I not tested  CN II not tested CN III, IV, VI PERRLA and EOMs intact bilaterally  CN V Intact sensation to sharp and light touch to the face  CN VII facial movements symmetric  CN VIII not tested  CN IX, X no uvula deviation, symmetric rise of soft palate  CN XI symmetric SCM and trapezius strength bilaterally  CN XII Midline tongue protrusion, symmetric L/R movements     Psychiatric:        Mood and Affect: Mood normal.     (all labs ordered are listed, but only abnormal results are displayed) Labs Reviewed  COMPREHENSIVE METABOLIC PANEL WITH GFR - Abnormal; Notable for the following components:      Result Value   CO2 21 (*)    Glucose, Bld 143 (*)  Creatinine, Ser 1.34 (*)    GFR, Estimated 42 (*)    All other components within normal limits  CBC - Abnormal; Notable for the following components:   RBC 3.48 (*)    Hemoglobin 10.9 (*)    HCT 32.9 (*)    All other components within normal limits  URINALYSIS, ROUTINE W REFLEX MICROSCOPIC - Abnormal; Notable for the following components:   APPearance HAZY (*)    Hgb urine dipstick TRACE (*)    Bilirubin Urine SMALL (*)    Ketones, ur TRACE (*)    Protein, ur 30 (*)    Leukocytes,Ua LARGE (*)    All other components within normal limits  OCCULT BLOOD X 1 CARD TO LAB, STOOL  CBG MONITORING, ED  TROPONIN T, HIGH SENSITIVITY    EKG: None  Radiology: No results found.   Procedures   Medications Ordered in the ED  sodium chloride  0.9 % bolus 1,000 mL (1,000 mLs Intravenous New Bag/Given 07/25/24 1514)                                    Medical Decision Making Amount and/or Complexity of Data Reviewed Labs: ordered.  Risk Decision regarding hospitalization.   This patient presents to the ED for concern of rectal bleeding, syncope, this involves an extensive number of treatment options, and is a complaint that carries with it a high risk of complications and morbidity.  The  differential diagnosis includes dehydration, anemia, orthostasis, seizure, arrhythmia, ACS, PE, other   Co morbidities that complicate the patient evaluation  See HPI   Additional history obtained:  Additional history obtained from EMR External records from outside source obtained and reviewed including hospital records   Lab Tests:  I Ordered, and personally interpreted labs.  The pertinent results include: Mild decrease in bicarb of 21 otherwise, lecture lites within normal limits.  Slight elevation of creatinine 1.34 from baseline around 1-1.1.  No transaminitis.  UA trace hemoglobin, small bilirubin, trace ketones, 30 proteins, large leukocytes, 21-50 WBCs, 6-10 RBCs.  Occult positive.  No leukocytosis.  Anemia hemoglobin of 10.9 from baseline around 13.  Platelets within normal range.  PT/INR within normal limits.  Troponin of less than 15 with repeat less than 15.   Imaging Studies ordered:  N/a   Cardiac Monitoring: / EKG:  The patient was maintained on a cardiac monitor.  I personally viewed and interpreted the cardiac monitored which showed an underlying rhythm of: Normal sinus rhythm   Consultations Obtained:  See ED course  Problem List / ED Course / Critical interventions / Medication management  Hematochezia, Anemia, syncope I ordered medication including normal saline   Reevaluation of the patient after these medicines showed that the patient improved I have reviewed the patients home medicines and have made adjustments as needed   Social Determinants of Health:  Denies tobacco, licit drug use.   Test / Admission - Considered:  Hematochezia, Anemia, syncope Vitals signs significant for initial hypertension blood pressure 92/75 with improvement IV fluids 130/70. Otherwise within normal range and stable throughout visit. Laboratory/imaging studies significant for: See above  68 year old female presents emergency department with concern for rectal  bleeding.  States over the past 2 to 3 days, has noticed dark red blood in her stool.  Reports a few episodes a couple days ago, 6 episodes yesterday and 2-3 episodes today.  Denies any blood thinner use.  States  she has history of external hemorrhoids but feels as if they are not irritated.  Denies any abdominal or rectal pain.  States that today while walking in downtown [place], was having some chest tightness and shortness of breath.  Sat down to rest 15 minutes.  When she stood back up to begin walking, felt lightheaded felt as if she were going to pass out.  States that she went down to her knees and subsequently lost consciousness for a few seconds before regaining consciousness.  Brought by family members.  States that she has not had symptoms like this before. On exam, no abdominal tenderness.  Rectal exam showed bright red blood in rectal vault with external hemorrhoid present which appears to not be the source.  Labs concerning for worsening anemia hemoglobin of 10.9 from baseline around 13.  PT/INR within normal limits.  Given patient's syncopal episode in the setting of lower GI bleed, anemia drop in 2 to 3 g from baseline, suspect patient would benefit most from admission for at least observation.  Consulted GI who agreed with admission to hospitalist.  [facility] hospitalist who agreed with admission.  Treatment plan discussed with patient she acknowledged understanding was agreeable.  Patient well-appearing, afebrile in no acute distress upon admission..      Final diagnoses:  None    ED Discharge Orders     None          Silver Wonda LABOR, [place] 07/25/24 1939    Lenor Hollering, MD 07/25/24 [phone]

## 2024-07-25 NOTE — ED Notes (Signed)
 Called [facility] to transport the patient to [facility] [id number] [id number]

## 2024-07-25 NOTE — H&P (Addendum)
 History and Physical  Haley [name] FMW:[id number] DOB: [birth year] DOA: 07/25/2024  Referring physician: Accepted by Dr. Vernal [facility], hospitalist service. PCP: Adella Norris, MD  Outpatient Specialists: [facility], cardiology. Patient coming from: Home.  Chief Complaint: Rectal bleeding.  HPI: Haley [name] is a 68 y.o. female with medical history significant for essential hypertension, GERD, reflux esophagitis, esophageal stenosis post dilatation on 07/03/2024, obesity on Mounjaro , extensive diverticular disease, who presents to the ER due to rectal bleeding with onset yesterday 07/24/2024 around 6:30 PM.  Denies prior history of GI bleed.  Last colonoscopy was 3 years ago.  Endorses having hemorrhoids that are painful when she passes stools.  Was recently prescribed p.o. PPI twice daily, after diagnosis of reflux esophagitis by GI.  Takes baby aspirin  daily and denies use of any other NSAIDs.  Her lower GI bleed has persisted since its onset.  Endorses exertional chest tightness and mild dyspnea today while walking.  Also lightheadedness and loss of consciousness for few seconds.    In the ER, the patient had recurrent rectal bleeding.  She declined blood transfusion due to religious reasons as a Jehovah witness.  EDP consulted GI.  Admitted by Dr. Vernal, [facility], hospitalist service, and transferred to [facility].  On exam, tenderness is noted on palpation at epigastric region.  IV PPI twice daily initiated.  Repeat hemoglobin 7.9 from 10.9.  The patient had another episode of bloody stool and was transferred to stepdown unit for closer monitoring.    ED Course: Temp 97.9.  BP 106/61, pulse 80, respiration rate 16, O2 saturation 100% on room air.  Lab studies notable for troponin less than 15.  BUN 21, glucose 143, creatinine 1.34 from baseline of 1.0.  Review of Systems: Review of systems as noted in the HPI. All other systems reviewed and are negative.   Past Medical  History:  Diagnosis Date   Back pain    Coronary artery disease    Depression    DM type 2, controlled, with complication (HCC) 07/22/2013   Type II DM diagnosed in ~ 2006 per pt hx.    Environmental and seasonal allergies    Ganglion cyst of wrist, right 05/09/2017   Hyperlipidemia    Hypertension    Itching    Refusal of blood transfusions as patient is Jehovah's Witness    Shingles    Stroke (HCC) 2000   per pt. report:   TIA   Past Surgical History:  Procedure Laterality Date   ABDOMINAL HYSTERECTOMY  1994   Laparoscopic; ovaries still intact.  Performed for prolapsed uterus.   AREOLA/NIPPLE RECONSTRUCTION WITH GRAFT Bilateral 09/19/2021   Procedure: FREE NIPPLE GRAFT;  Surgeon: Elisabeth Craig RAMAN, MD;  Location: [facility] OR;  Service: Plastics;  Laterality: Bilateral;   BREAST REDUCTION SURGERY Bilateral 09/19/2021   Procedure: Bilateral Breast Reduction;  Surgeon: Elisabeth Craig RAMAN, MD;  Location: [facility] OR;  Service: Plastics;  Laterality: Bilateral;  2 hours   CARDIAC CATHETERIZATION N/A 10/23/2016   Procedure: Left Heart Cath and Coronary Angiography;  Surgeon: Lonni JONETTA Cash, MD;  Location: [facility] INVASIVE CV LAB;  Service: Cardiovascular;  Laterality: N/A;   CHOLECYSTECTOMY  1997   Laparoscopic   COLONOSCOPY  2005-at age 1   Hyperplastic polyp--[place]   LEFT HEART CATH AND CORONARY ANGIOGRAPHY N/A 05/04/2020   Procedure: LEFT HEART CATH AND CORONARY ANGIOGRAPHY;  Surgeon: [place], Peter M, MD;  Location: [facility] INVASIVE CV LAB;  Service: Cardiovascular;  Laterality: N/A;   WISDOM  TOOTH EXTRACTION      Social History:  reports that she has never smoked. She has never been exposed to tobacco smoke. She has never used smokeless tobacco. She reports that she does not currently use alcohol. She reports that she does not use drugs.   Allergies  Allergen Reactions   Praluent  [Alirocumab ]     Had significant fatigue and memory loss--was lost driving in town.  Joint pain and nausea  as well.   Zetia [Ezetimibe] Other (See Comments)    Joint pain and ear ringing   Atorvastatin  Other (See Comments)    Pt states causes bilateral lower extremity muscle cramps   Pravastatin  Nausea And Vomiting and Other (See Comments)    Causes dizziness   Repatha  [Evolocumab ] Other (See Comments)    cramps   Rosuvastatin  Other (See Comments)    Leg cramping on 5-10mg  daily    Family History  Problem Relation Age of Onset   Diabetes Father    Diabetes Sister        prediabetic   Cancer Sister 53       uterine cancer   Hypertension Brother    Diabetes Brother    Seizures Brother    Stroke Maternal Aunt        Multiple aunts died from strokes-maternal   Hypertension Son    Diabetes Son    Colon cancer Neg Hx    Esophageal cancer Neg Hx    Pancreatic cancer Neg Hx    Stomach cancer Neg Hx       Prior to Admission medications   Medication Sig Start Date End Date Taking? Authorizing Provider  ACCU-CHEK GUIDE TEST test strip SMARTSIG:Strip(s) 07/14/24   [provider]  Alcohol Swabs (PHARMACIST CHOICE ALCOHOL) PADS  07/14/24   [provider]  aspirin  81 MG tablet Take 81 mg by mouth daily.    [provider]  Blood Glucose Calibration (ACCU-CHEK GUIDE CONTROL) LIQD  07/14/24   [provider]  Calcium  Citrate 250 MG TABS 2 tabs by mouth twice daily. 04/17/23   Adella Norris, MD  Cholecalciferol (DIALYVITE VITAMIN D  5000) 125 MCG (5000 UT) capsule Take 15,000 Units by mouth daily.    [provider]  Cyanocobalamin  (B-12 PO) Take 1 tablet by mouth daily.    [provider]  Flaxseed, Linseed, (FLAXSEED OIL) 1000 MG CAPS Take 1,000 mg by mouth daily at 8 pm.    [provider]  GARLIC PO Take 6,000 mg by mouth daily. Patient not taking: Reported on 07/25/2024    [provider]  Liniments (BLUE-EMU SUPER STRENGTH EX) Apply 1 application topically daily as needed (pain).    [provider]   lisinopril  (ZESTRIL ) 40 MG tablet Take 1 tablet (40 mg total) by mouth daily. 04/07/24   Duke, Jon Garre, PA  metoprolol  succinate (TOPROL -XL) 50 MG 24 hr tablet Take 1 tablet (50 mg total) by mouth as needed (PALPITATIONS). Take with or immediately following a meal. 12/04/23   Loni Soyla LABOR, MD  mometasone  (NASONEX ) 50 MCG/ACT nasal spray USE 2 SPRAY(S) IN EACH NOSTRIL ONCE DAILY 08/15/21   Adella Norris, MD  Multiple Vitamins-Minerals (MULTIVITAMIN WITH MINERALS) tablet Take 1 tablet by mouth daily.    [provider]  nitroGLYCERIN  (NITROSTAT ) 0.4 MG SL tablet DISSOLVE ONE TABLET UNDER THE TONGUE EVERY 5 MINUTES AS NEEDED FOR CHEST PAIN.  DO NOT EXCEED A TOTAL OF 3 DOSES IN 15 MINUTES 07/08/24   Adella Norris, MD  omega-3  acid ethyl esters (LOVAZA ) 1 g capsule Take 1 capsule (1 g total) by mouth 2 (two) times daily. 05/19/24   Adella Norris, MD  pantoprazole  (PROTONIX ) 40 MG tablet Take 1 tablet (40 mg total) by mouth 2 (two) times daily before a meal. 07/01/24   Nandigam, Kavitha V, MD  Pharmacist Choice Lancets MISC  07/14/24   [provider]  Psyllium (METAMUCIL 3 IN 1 DAILY FIBER) 400 MG CAPS Use as directed daily with 8 oz water  Patient not taking: Reported on 07/25/2024 03/20/24   Adella Norris, MD  tirzepatide  (MOUNJARO ) 15 MG/0.5ML Pen Inject 15 mg into the skin once a week. 02/11/24   Acharya, Gayatri A, MD  vitamin C (ASCORBIC ACID) 500 MG tablet Take 1,000 mg by mouth daily. Patient not taking: Reported on 07/25/2024    [provider]  zinc gluconate 50 MG tablet Take 50 mg by mouth daily. Patient not taking: Reported on 07/25/2024    [provider]    Physical Exam: BP (!) 144/72 (BP Location: Left Arm)   Pulse 78   Temp 97.9 F (36.6 C)   Resp 16   SpO2 100%   General: 68 y.o. year-old female well developed well nourished in no acute distress.  Alert and oriented x3. Cardiovascular: Regular rate and rhythm with no  rubs or gallops.  No thyromegaly or JVD noted.  No lower extremity edema. 2/4 pulses in all 4 extremities. Respiratory: Clear to auscultation with no wheezes or rales. Good inspiratory effort. Abdomen: Soft, epigastric tenderness, nondistended with normal bowel sounds x4 quadrants. Muskuloskeletal: No cyanosis, clubbing or edema noted bilaterally Neuro: CN II-XII intact, strength, sensation, reflexes Skin: No ulcerative lesions noted or rashes Psychiatry: Judgement and insight appear normal. Mood is appropriate for condition and setting          Labs on Admission:  Basic Metabolic Panel: Recent Labs  Lab 07/25/24 1345  NA 139  K 3.8  CL 106  CO2 21*  GLUCOSE 143*  BUN 18  CREATININE 1.34*  CALCIUM  9.9   Liver Function Tests: Recent Labs  Lab 07/25/24 1345  AST 19  ALT 23  ALKPHOS 53  BILITOT 0.4  PROT 6.7  ALBUMIN 3.9   No results for input(s): LIPASE, AMYLASE in the last 168 hours. No results for input(s): AMMONIA in the last 168 hours. CBC: Recent Labs  Lab 07/25/24 1345  WBC 6.3  HGB 10.9*  HCT 32.9*  MCV 94.5  PLT 280   Cardiac Enzymes: No results for input(s): CKTOTAL, CKMB, CKMBINDEX, TROPONINI in the last 168 hours.  BNP (last 3 results) No results for input(s): BNP in the last 8760 hours.  ProBNP (last 3 results) No results for input(s): PROBNP in the last 8760 hours.  CBG: No results for input(s): GLUCAP in the last 168 hours.  Radiological Exams on Admission: No results found.  EKG: I independently viewed the EKG done and my findings are as followed: Normal sinus rhythm rate of 75.  Nonspecific ST-T changes.  QTc 431.  Assessment/Plan Present on Admission:  GI bleed  Principal Problem:   GI bleed  GI bleed, history of diverticular disease and reflux esophagitis Followed by GI outpatient, last EGD was on 07/03/2024 The patient does not accept blood transfusion due to religious reasons Hemoglobin 7.9 from 10.9 GI  consulted by EDP Continue IV PPI twice daily Minimize blood draws IV fluid hydration Maintain MAP greater than 65  Acute blood loss anemia in the setting of GI bleed  Hemoglobin at baseline 13.2 on 03/05/2024 Management as stated above  Hypertension Hold off home oral antihypertensive Closely monitor vital signs Maintain MAP greater than 65  AKI, prerenal Volume loss from GI bleed Baseline creatinine 1.0 Presented with creatinine 1.34 IV fluid hydration Repeat BMP in the morning  Brief syncope, suspect from hypovolemia Continue IV fluid Monitor on telemetry Followed by cardiology outpatient.  Intermittent chest tightness Troponin less than 15 No evidence of acute ischemia on twelve-lead EKG. Continue to closely monitor on telemetry.  History of stroke, per patient report Hold off aspirin    Obesity On Mounjaro , once weekly, last use was on Wednesday    Critical care time: 55 minutes.    DVT prophylaxis: SCDs.  Code Status: Full code.  Family Communication: Daughter at bedside.  Disposition Plan: Admitted to stepdown unit.  Consults called: GI consulted by EDP.  Admission status: Inpatient status.   Status is: Inpatient The patient requires at least 2 midnights for further evaluation and treatment of present condition.   Haley LOISE Hurst MD [facility] Pager [phone]  If 7PM-7AM, please contact night-coverage [email or web address] Password [email or web address]  07/25/2024, 10:16 PM

## 2024-07-26 ENCOUNTER — Inpatient Hospital Stay (HOSPITAL_COMMUNITY)

## 2024-07-26 DIAGNOSIS — K5791 Diverticulosis of intestine, part unspecified, without perforation or abscess with bleeding: Secondary | ICD-10-CM | POA: Diagnosis not present

## 2024-07-26 DIAGNOSIS — K625 Hemorrhage of anus and rectum: Secondary | ICD-10-CM | POA: Diagnosis not present

## 2024-07-26 DIAGNOSIS — K5731 Diverticulosis of large intestine without perforation or abscess with bleeding: Secondary | ICD-10-CM | POA: Diagnosis not present

## 2024-07-26 DIAGNOSIS — Z531 Procedure and treatment not carried out because of patient's decision for reasons of belief and group pressure: Secondary | ICD-10-CM | POA: Diagnosis not present

## 2024-07-26 DIAGNOSIS — D62 Acute posthemorrhagic anemia: Secondary | ICD-10-CM | POA: Diagnosis not present

## 2024-07-26 LAB — BASIC METABOLIC PANEL WITH GFR
Anion gap: 8 (ref 5–15)
BUN: 19 mg/dL (ref 8–23)
CO2: 23 mmol/L (ref 22–32)
Calcium: 8.3 mg/dL — ABNORMAL LOW (ref 8.9–10.3)
Chloride: 111 mmol/L (ref 98–111)
Creatinine, Ser: 1.03 mg/dL — ABNORMAL HIGH (ref 0.44–1.00)
GFR, Estimated: 58 mL/min — ABNORMAL LOW (ref >60)
Glucose, Bld: 101 mg/dL — ABNORMAL HIGH (ref 70–99)
Potassium: 4 mmol/L (ref 3.5–5.1)
Sodium: 141 mmol/L (ref 135–145)

## 2024-07-26 LAB — HEMOGLOBIN AND HEMATOCRIT, BLOOD
HCT: 21.4 % — ABNORMAL LOW (ref 36.0–46.0)
HCT: 21.4 % — ABNORMAL LOW (ref 36.0–46.0)
HCT: 19.1 % — ABNORMAL LOW (ref 36.0–46.0)
Hemoglobin: 6.7 g/dL — CL (ref 12.0–15.0)
Hemoglobin: 6.8 g/dL — CL (ref 12.0–15.0)
Hemoglobin: 5.9 g/dL — CL (ref 12.0–15.0)

## 2024-07-26 LAB — GLUCOSE, CAPILLARY: Glucose-Capillary: 115 mg/dL — ABNORMAL HIGH (ref 70–99)

## 2024-07-26 LAB — MRSA NEXT GEN BY PCR, NASAL: MRSA by PCR Next Gen: NOT DETECTED

## 2024-07-26 LAB — HIV ANTIBODY (ROUTINE TESTING W REFLEX): HIV Screen 4th Generation wRfx: NONREACTIVE

## 2024-07-26 MED ORDER — CHLORHEXIDINE GLUCONATE CLOTH 2 % EX PADS
6.0000 | MEDICATED_PAD | Freq: Every day | CUTANEOUS | Status: DC
  Administered 2024-07-28 – 2024-08-01 (×4): 6 via TOPICAL

## 2024-07-26 MED ORDER — IOHEXOL 350 MG/ML SOLN
100.0000 mL | Freq: Once | INTRAVENOUS | Status: AC | PRN
  Administered 2024-07-26: 100 mL via INTRAVENOUS

## 2024-07-26 MED ORDER — ONDANSETRON HCL 4 MG/2ML IJ SOLN: 4.0000 mg | Freq: Four times a day (QID) | INTRAMUSCULAR | Status: DC | PRN

## 2024-07-26 MED ORDER — MIDODRINE HCL 5 MG PO TABS: 10.0000 mg | ORAL_TABLET | Freq: Once | ORAL | Status: DC | PRN

## 2024-07-26 MED ORDER — HYDROCODONE-ACETAMINOPHEN 5-325 MG PO TABS
1.0000 | ORAL_TABLET | Freq: Four times a day (QID) | ORAL | Status: DC | PRN
  Administered 2024-07-26 – 2024-07-30 (×2): 1 via ORAL
  Filled 2024-07-26 (×2): qty 1

## 2024-07-26 MED ORDER — SODIUM CHLORIDE 0.9 % IV SOLN
300.0000 mg | Freq: Once | INTRAVENOUS | Status: AC
  Administered 2024-07-26: 300 mg via INTRAVENOUS
  Filled 2024-07-26: qty 15

## 2024-07-26 MED ORDER — NITROGLYCERIN 0.4 MG SL SUBL
SUBLINGUAL_TABLET | SUBLINGUAL | Status: AC
  Filled 2024-07-26: qty 1

## 2024-07-26 MED ORDER — NITROGLYCERIN 0.4 MG SL SUBL
0.4000 mg | SUBLINGUAL_TABLET | SUBLINGUAL | Status: DC | PRN
  Administered 2024-07-28 – 2024-07-31 (×8): 0.4 mg via SUBLINGUAL
  Filled 2024-07-26: qty 1

## 2024-07-26 MED ORDER — POLYETHYLENE GLYCOL 3350 17 G PO PACK
17.0000 g | PACK | Freq: Every day | ORAL | Status: DC
  Administered 2024-07-28 – 2024-08-01 (×5): 17 g via ORAL
  Filled 2024-07-26 (×6): qty 1

## 2024-07-26 MED ORDER — DOCUSATE SODIUM 100 MG PO CAPS
100.0000 mg | ORAL_CAPSULE | Freq: Two times a day (BID) | ORAL | Status: DC
  Administered 2024-07-28 – 2024-08-01 (×9): 100 mg via ORAL
  Filled 2024-07-26 (×10): qty 1

## 2024-07-26 MED ORDER — MORPHINE SULFATE (PF) 2 MG/ML IV SOLN
2.0000 mg | Freq: Once | INTRAVENOUS | Status: AC
  Administered 2024-07-26: 2 mg via INTRAVENOUS
  Filled 2024-07-26: qty 1

## 2024-07-26 MED ORDER — HYOSCYAMINE SULFATE 0.125 MG SL SUBL: 0.1250 mg | SUBLINGUAL_TABLET | Freq: Three times a day (TID) | SUBLINGUAL | Status: DC | PRN

## 2024-07-26 MED ORDER — SODIUM CHLORIDE 0.9 % IV BOLUS
1000.0000 mL | Freq: Once | INTRAVENOUS | Status: AC
  Administered 2024-07-26: 1000 mL via INTRAVENOUS

## 2024-07-26 NOTE — Consult Note (Signed)
 Gastroenterology Inpatient Consultation   Attending Requesting Consult Sonjia Held, MD  [facility] Day: 2  Reason for Consult GI bleeding/rectal bleeding    History of Present Illness  Haley [name] is a 68 y.o. female with a pmh significant for diabetes, hypertension, hyperlipidemia, prior TIA, MDD, esophageal stricture, diverticulosis.  The GI service is consulted for evaluation and management of acute blood loss anemia in the setting of rectal bleeding.  The patient was in her normal state of health when starting on Thursday evening she began to experience small amount of rectal bleeding.  She initially thought this was just hemorrhoids.  However she had some abdominal discomfort and cramping that occurred around the time of the bowel movement and then improved after passage of her bowel movement.  She ended up having further episodes of rectal bleeding and came in for further evaluation.  Patient's last bowel movement was 7 AM.  Her hemoglobin has dropped from her baseline into the 6.7 range as of this morning.  As a result of her religious beliefs, she declines/defers blood transfusions.  She does not take significant nonsteroidals or BC/Goody powders but does use a baby aspirin .  She states she has never had anything similar to this ever in the past.  She deals with constipation on a longer-term basis though within the last 6 months she had noted some further increased constipation issues.  She has undergone colonoscopy in the past (see full below) with only findings of diverticulosis-significant.  Each bowel movement that she has she has significant urgency and then the lower discomfort which again improves after her bowel movement is completed.  She denies any upper abdominal pain or nausea or vomiting.  She is open to IV iron  (her father has had this in the past and is confirmed okay per the patient's sister).  GI Review of Systems Positive as above Negative for ongoing  dysphagia, nausea, vomiting, melena   Review of Systems  General: Denies fevers/chills/weight loss unintentionally Cardiovascular: Denies chest pain Pulmonary: Denies shortness of breath Gastroenterological: See HPI Genitourinary: Denies darkened urine Hematological: Denies easy bruising/bleeding Dermatological: Denies jaundice Psychological: Mood is stable but concerned since her blood counts continue to drop   Histories  Past Medical Past Medical History:  Diagnosis Date   Back pain    Coronary artery disease    Depression    DM type 2, controlled, with complication (HCC) 07/22/2013   Type II DM diagnosed in ~ 2006 per pt hx.    Environmental and seasonal allergies    Ganglion cyst of wrist, right 05/09/2017   Hyperlipidemia    Hypertension    Itching    Refusal of blood transfusions as patient is Jehovah's Witness    Shingles    Stroke (HCC) 2000   per pt. report:   TIA   Past Surgical Past Surgical History:  Procedure Laterality Date   ABDOMINAL HYSTERECTOMY  1994   Laparoscopic; ovaries still intact.  Performed for prolapsed uterus.   AREOLA/NIPPLE RECONSTRUCTION WITH GRAFT Bilateral 09/19/2021   Procedure: FREE NIPPLE GRAFT;  Surgeon: Elisabeth Craig RAMAN, MD;  Location: [facility] OR;  Service: Plastics;  Laterality: Bilateral;   BREAST REDUCTION SURGERY Bilateral 09/19/2021   Procedure: Bilateral Breast Reduction;  Surgeon: Elisabeth Craig RAMAN, MD;  Location: [facility] OR;  Service: Plastics;  Laterality: Bilateral;  2 hours   CARDIAC CATHETERIZATION N/A 10/23/2016   Procedure: Left Heart Cath and Coronary Angiography;  Surgeon: Lonni JONETTA Cash, MD;  Location: [facility] INVASIVE  CV LAB;  Service: Cardiovascular;  Laterality: N/A;   CHOLECYSTECTOMY  1997   Laparoscopic   COLONOSCOPY  2005-at age 89   Hyperplastic polyp--[place]   LEFT HEART CATH AND CORONARY ANGIOGRAPHY N/A 05/04/2020   Procedure: LEFT HEART CATH AND CORONARY ANGIOGRAPHY;  Surgeon: [place], Peter M, MD;  Location:  [facility] INVASIVE CV LAB;  Service: Cardiovascular;  Laterality: N/A;   WISDOM TOOTH EXTRACTION     Allergies Allergies  Allergen Reactions   Praluent  [Alirocumab ]     Had significant fatigue and memory loss--was lost driving in town.  Joint pain and nausea as well.   Zetia [Ezetimibe] Other (See Comments)    Joint pain and ear ringing   Atorvastatin  Other (See Comments)    Pt states causes bilateral lower extremity muscle cramps   Pravastatin  Nausea And Vomiting and Other (See Comments)    Causes dizziness   Repatha  [Evolocumab ] Other (See Comments)    cramps   Rosuvastatin  Other (See Comments)    Leg cramping on 5-10mg  daily   Family Family History  Problem Relation Age of Onset   Diabetes Father    Diabetes Sister        prediabetic   Cancer Sister 54       uterine cancer   Hypertension Brother    Diabetes Brother    Seizures Brother    Stroke Maternal Aunt        Multiple aunts died from strokes-maternal   Hypertension Son    Diabetes Son    Colon cancer Neg Hx    Esophageal cancer Neg Hx    Pancreatic cancer Neg Hx    Stomach cancer Neg Hx    Social Social History   Socioeconomic History   Marital status: Single    Spouse name: Not on file   Number of children: 2   Years of education: 12+   Highest education level: Some college, no degree  Occupational History   Occupation: Print production planner    Comment: Retired now  Tobacco Use   Smoking status: Never    Passive exposure: Never   Smokeless tobacco: Never  Vaping Use   Vaping status: Never Used  Substance and Sexual Activity   Alcohol use: Not Currently   Drug use: No   Sexual activity: Not Currently    Birth control/protection: Surgical  Other Topics Concern   Not on file  Social History Narrative   Did get some post high school training/education   Lives alone   [place] 2 great grandchildren frequently   Daughter lives in [place] and checks in regularly.   Social Drivers of Research scientist (physical sciences) Strain: Low Risk  (04/18/2023)   Overall Financial Resource Strain (CARDIA)    Difficulty of Paying Living Expenses: Not hard at all  Food Insecurity: No Food Insecurity (07/25/2024)   Hunger Vital Sign    Worried About Running Out of Food in the Last Year: Never true    Ran Out of Food in the Last Year: Never true  Transportation Needs: No Transportation Needs (07/25/2024)   PRAPARE - Administrator, Civil Service (Medical): No    Lack of Transportation (Non-Medical): No  Physical Activity: Unknown (08/15/2021)   Received from [facility]   Exercise Vital Sign    On average, how many days per week do you engage in moderate to strenuous exercise (like a brisk walk)?: Patient declined    Minutes of Exercise per Session: Not on file  Stress:  Unknown (08/15/2021)   Received from [facility] - Occupational Stress Questionnaire    Feeling of Stress : Patient declined  Social Connections: Moderately Isolated (07/25/2024)   Social Connection and Isolation Panel    Frequency of Communication with Friends and Family: More than three times a week    Frequency of Social Gatherings with Friends and Family: More than three times a week    Attends Religious Services: 1 to 4 times per year    Active Member of [employer] or Organizations: No    Attends Banker Meetings: Never    Marital Status: Divorced  Catering manager Violence: Not At Risk (07/25/2024)   Humiliation, Afraid, Rape, and Kick questionnaire    Fear of Current or Ex-Partner: No    Emotionally Abused: No    Physically Abused: No    Sexually Abused: No    Medications  Home Medications No current facility-administered medications on file prior to encounter.   Current Outpatient Medications on File Prior to Encounter  Medication Sig Dispense Refill   ACCU-CHEK GUIDE TEST test strip SMARTSIG:Strip(s)     Alcohol Swabs (PHARMACIST CHOICE ALCOHOL) PADS       aspirin  81 MG tablet Take 81 mg by mouth daily.     Blood Glucose Calibration (ACCU-CHEK GUIDE CONTROL) LIQD      Calcium  Citrate 250 MG TABS 2 tabs by mouth twice daily.  0   Cholecalciferol (DIALYVITE VITAMIN D  5000) 125 MCG (5000 UT) capsule Take 15,000 Units by mouth daily.     Cyanocobalamin  (B-12 PO) Take 1 tablet by mouth daily.     Flaxseed, Linseed, (FLAXSEED OIL) 1000 MG CAPS Take 1,000 mg by mouth daily at 8 pm.     GARLIC PO Take 6,000 mg by mouth daily. (Patient not taking: Reported on 07/25/2024)     Liniments (BLUE-EMU SUPER STRENGTH EX) Apply 1 application topically daily as needed (pain).     lisinopril  (ZESTRIL ) 40 MG tablet Take 1 tablet (40 mg total) by mouth daily. 90 tablet 2   metoprolol  succinate (TOPROL -XL) 50 MG 24 hr tablet Take 1 tablet (50 mg total) by mouth as needed (PALPITATIONS). Take with or immediately following a meal. 90 tablet 1   mometasone  (NASONEX ) 50 MCG/ACT nasal spray USE 2 SPRAY(S) IN EACH NOSTRIL ONCE DAILY 51 g 3   Multiple Vitamins-Minerals (MULTIVITAMIN WITH MINERALS) tablet Take 1 tablet by mouth daily.     nitroGLYCERIN  (NITROSTAT ) 0.4 MG SL tablet DISSOLVE ONE TABLET UNDER THE TONGUE EVERY 5 MINUTES AS NEEDED FOR CHEST PAIN.  DO NOT EXCEED A TOTAL OF 3 DOSES IN 15 MINUTES 25 tablet 1   omega-3 acid ethyl esters (LOVAZA ) 1 g capsule Take 1 capsule (1 g total) by mouth 2 (two) times daily. 180 capsule 3   pantoprazole  (PROTONIX ) 40 MG tablet Take 1 tablet (40 mg total) by mouth 2 (two) times daily before a meal. 180 tablet 3   Pharmacist Choice Lancets MISC      Psyllium (METAMUCIL 3 IN 1 DAILY FIBER) 400 MG CAPS Use as directed daily with 8 oz water  (Patient not taking: Reported on 07/25/2024)     tirzepatide  (MOUNJARO ) 15 MG/0.5ML Pen Inject 15 mg into the skin once a week. 2 mL 11   vitamin C (ASCORBIC ACID) 500 MG tablet Take 1,000 mg by mouth daily. (Patient not taking: Reported on 07/25/2024)     zinc gluconate 50 MG tablet Take 50 mg by mouth  daily. (Patient not taking: Reported on 07/25/2024)     Scheduled Inpatient Medications  Chlorhexidine  Gluconate Cloth  6 each Topical Daily   docusate sodium   100 mg Oral BID   pantoprazole  (PROTONIX ) IV  40 mg Intravenous BID   polyethylene glycol  17 g Oral Daily   Continuous Inpatient Infusions  sodium chloride  75 mL/hr at 07/26/24 0800   iron  sucrose     PRN Inpatient Medications acetaminophen , HYDROcodone -acetaminophen , hyoscyamine , melatonin, midodrine , prochlorperazine    Physical Examination  BP 131/78 (BP Location: Left Arm)   Pulse 76   Temp 98.1 F (36.7 C) (Oral)   Resp 17   Ht 5' 1 (1.549 m)   Wt 75.9 kg   SpO2 100%   BMI 31.62 kg/m  GEN: NAD, appears stated age, doesn't appear chronically ill, resting in bed, sister on the phone during today's consultation PSYCH: Cooperative, without pressured speech EYE: Conjunctivae pink, sclerae anicteric ENT: MMM CV: Nontachycardic RESP: No audible wheezing GI: NABS, soft, NT/ND, without rebound or guarding GU: DRE deferred MSK/EXT: No significant lower extremity edema SKIN: No jaundice NEURO:  Alert & Oriented x 3, no focal deficits   Review of Data  I reviewed the following data at the time of this encounter:  Laboratory Studies   Recent Labs  Lab 07/26/24 0454  NA 141  K 4.0  CL 111  CO2 23  BUN 19  CREATININE 1.03*  GLUCOSE 101*  CALCIUM  8.3*   Recent Labs  Lab 07/25/24 1345  AST 19  ALT 23  ALKPHOS 53    Recent Labs  Lab 07/25/24 1345 07/25/24 2239 07/26/24 0957  WBC 6.3  --   --   HGB 10.9*   < > 6.8*  HCT 32.9*   < > 21.4*  PLT 280  --   --    < > = values in this interval not displayed.   Recent Labs  Lab 07/25/24 1636  INR 1.0   Imaging Studies  2/25 CTAP IMPRESSION: 1. No acute abnormality in the abdomen or pelvis. 2. Extensive colonic diverticulosis without evidence of acute diverticulitis. 3. Large fat containing paraumbilical hernia. 4.  Aortic Atherosclerosis  (ICD10-I70.0).  GI Procedures and Studies  06/2024 EGD - Benign-appearing esophageal stenosis. Dilated. - Small hiatal hernia. - Normal stomach. - Normal examined duodenum. - Biopsies were taken with a cold forceps for evaluation of eosinophilic esophagitis. Pathology FINAL DIAGNOSIS       1. Surgical [P], esophageal biopsy :       - ESOPHAGEAL SQUAMOUS MUCOSA WITH MILD VASCULAR CONGESTION, AND FOCAL SQUAMOUS       BALLOONING, SUGGESTIVE OF REFLUX ESOPHAGITIS       - NEGATIVE FOR INCREASED INTRAEPITHELIAL EOSINOPHILS   2021 colonoscopy - Severe diverticulosis in the sigmoid colon, in the descending colon, in the transverse colon and in the ascending colon. There was evidence of diverticular spasm. Peri-diverticular erythema was seen. There was evidence of an impacted diverticulum. - Non-bleeding internal hemorrhoids. - The examination was otherwise normal. - No specimens collected.   Assessment  Ms. Stucker is a 68 y.o. female.  The GI service is consulted for evaluation and management of acute blood loss anemia and bright red blood per rectum in setting of known diverticulosis.  The patient is hemodynamically stable.  Clinically, at this time she remains stable but I have concern in the setting of her wishes/desires of not receiving blood transfusions, that her hemoglobin could continue to worsen (she has already lost half her blood  volume based on her normal blood counts).  The etiology of her GI bleeding clinically seems to be most likely diverticular in the setting of her known significant/extensive diverticulosis.  I think her abdominal discomfort at the time of bowel movement passage is most likely related to cramping.  Her last colonoscopy was within the last 4 years, so the likelihood of something new having developed seems low but is not impossible.  I do not think a repeat colonoscopy currently is indicated.  However, if the patient continues to have stuttering bleeding and in the setting  of not being able to support her as adequately as we normally would want, we may have to consider colonoscopy.  If the patient has a significant other bowel movement today with bright red blood per rectum, I recommend CT GI bleeding scan to be performed.  I also recommend IV iron  if no contraindication from a medicine perspective and with the patient's religious beliefs.  All patient questions were answered to the best of my ability, and the patient agrees to the aforementioned plan of action with follow-up as indicated.   Plan/Recommendations  Clear liquid diet today IV fluids for maintenance Trend hemoglobin/hematocrit as per medicine If patient has significant recurrent bright red blood per rectum, CT GI bleed scan should be performed Hold heparin /VTE prophylaxis and rather use TED hose or SCDs No plan for colonoscopy currently but if patient has stuttering bleeding with negative scans we may consider the role of this since she is not able to be supported with significant blood products Strongly consider IV iron  Levsin  0.125, 3 times daily as needed abdominal cramping/pain   Thank you for this consult.  We will continue to follow.  Please page/call with questions or concerns.   Aloha Finner, MD [facility] Office # [phone]

## 2024-07-26 NOTE — Progress Notes (Signed)
 I came by to see the patient this evening because of her significant hematochezia earlier today, followed by subsequent CT-A suggestive of possible rectal source of bleeding.  The patient has not had any overt GI bleeding since the large episode earlier today associated with the syncopal event.  She even states that she passed a nonbloody stool later today.  Her hemoglobin did decrease further to 5.9 this evening from 6.8 earlier this morning.  I performed a digital rectal exam which did not reveal any ulcers or mucosal irregularities around the dentate line or distal rectum.  There was a scant amount of old red blood on the glove.  There were no clots and no fresh blood in the rectal vault.  Although the patient's presentation is certainly most consistent with a stuttering diverticular bleed, the patient's CTA results are suggestive of a rectal source.  We discussed how it is unusual for the rectum to be a source of brisk hemorrhage.  Differential includes Dieulafoy lesion versus ulcer.  The CTA results were not entirely conclusive of a rectal bleeding source ( questionable trace intraluminal contrast on delayed image, possible trace active bleeding.), so it may be a false positive, and with a colonic diverticular bleed as initially suspected.  Currently, the patient does not appear to be actively bleeding, and so there is no need for an emergent procedure this evening.  I discussed the case with Dr. Wilhelmenia, who is going to plan to do a flexible sigmoidoscopy tomorrow morning.  Please contact me this evening if the patient demonstrates recurrent brisk hemorrhage.

## 2024-07-26 NOTE — Hospital Course (Signed)
 Haley [name] is a 68 y.o. female with medical history significant for essential hypertension, GERD, reflux esophagitis, esophageal stenosis post dilatation on 07/03/2024, obesity on Mounjaro , extensive diverticular disease, who presents to the ED with complaints of rectal bleeding for 1 day with multiple episodes..  Last colonoscopy 3 years ago when she had hemorrhoids.  Recently was on PPI for reflux esophagitis but denied any NSAID usage.  Patient also had exertional chest tightness and mild dyspnea while walking.   In the ED, patient was afebrile.  Blood pressure was marginally low.  Troponin was less than 15.  Creatinine 1.3 from baseline 1.0.    Initial hemoglobin was 10.9. SABRA    Repeat hemoglobin 6.7 from 10.9.  CT scan of the abdomen with diverticulosis.  The patient had another episode of bloody stool and was transferred to stepdown unit for closer monitoring.   She declined blood transfusion due to religious reasons as a Jehovah witness   GI bleed, history of diverticular disease and reflux esophagitis Less likely secondary to external hemorrhoids. Patient had EGD done on 07/03/2024.  Does not wish PRBC transfusion due to religious reasons.  Latest hemoglobin of 6.7.  GI has been consulted.  Continue IV PPI.   Acute blood loss anemia in the setting of GI bleed Hemoglobin at baseline 13.2 on 03/05/2024, latest hemoglobin of 6.7.  Hypertension Continue to hold antihypertensives.  Latest blood pressure of 101/40   Mild AKI, prerenal Volume loss from GI bleed Baseline creatinine 1.0, presented with creatinine of 1.3. Creatinine has improved again.  Will continue to monitor BMP.   Syncope likely secondary to hypovolemia. IV fluids.  Followed by cardiology as outpatient.   Intermittent chest pain. Troponin less than 15.  No ischemia on twelve-lead EKG.   History of stroke, Continue to hold aspirin .   Obesity On Mounjaro , once weekly, last use was on Wednesday.

## 2024-07-26 NOTE — Progress Notes (Signed)
 At approximately 1152, patient requested help with getting to [facility] for the urge to have a bowel movement. This RN advised that she use bedpan instead because of her previous bloody bowel movements and low blood counts. Patient still wanted to get to [facility]. This RN assisted patient to [facility] with minimal assistance. This RN stayed in the room with patient, beside patient. Patient quickly became unresponsive after having bright red, large bowel movement and began falling forward. This RN assisted patient to the ground on her back. This RN ensured that patients head was protected, and patient was slowly brought to the floor. This RN called for help. Other ICU nurses, including ICU charge RN, quickly came to help. Vitals were assessed and patient regained consciousness. Multiple nurses and this RN assisted patient back to bed. Dr. Sonjia was paged and secure chatted along with Dr. Wilhelmenia with [facility]. Following event, this RN started previously scheduled IV iron . Dr. Sonjia added a liter bolus, which was also started. CTA was also ordered by MD.  Shortly after IV iron  was started, patient began having numbness, tingling, and weakness in the right arm (the side that the iron  was infusing into). Patient began having garbled/slurred speech and chest pain. This RN called ICU charge nurse into room. These symptoms quickly subsided. 12 lead EKG was completed and placed in chart. MD was notified and IV iron  was turned off per MD verbal orders.   Dr. Lue came to bedside to see patient and talk with family.   After Dr. Lue was finished with assessment and updating family, this RN transferred patient to CT, once CT was available. After CT was complete and patient was back on ICU bed, patient began having chest pressure/pain. Upon arrival to ICU, this RN sent a secure chat to Dr. Sonjia and requested he come to bedside. Dr. Sonjia quickly came to bedside and ordered nitroglycerin . Before this RN was able to  given nitroglycerin , the patient's chest pain subsided. This RN asked MD if he still wanted nitroglycerin  to be given, and MD said to hold nitroglycerin .

## 2024-07-26 NOTE — Progress Notes (Addendum)
 PROGRESS NOTE  Haley [name] FMW:[id number] DOB: 1956-04-01 DOA: 07/25/2024 PCP: Adella Norris, MD   LOS: 1 day   Brief narrative:  Haley [name] is a 68 y.o. female with medical history significant for essential hypertension, GERD, reflux esophagitis, esophageal stenosis post dilatation on 07/03/2024, obesity on Mounjaro , extensive diverticular disease, who presents to the ED with complaints of rectal bleeding for 1 day with multiple episodes..  Last colonoscopy 3 years ago when she had hemorrhoids.  Recently was on PPI for reflux esophagitis but denied any NSAID usage.  Patient also had exertional chest tightness and mild dyspnea while walking.   In the ED, patient was afebrile.  Blood pressure was marginally low.  Troponin was less than 15.  Creatinine 1.3 from baseline 1.0.    Initial hemoglobin was 10.9. Haley [name]    Repeat hemoglobin 6.7 from 10.9.  CT scan of the abdomen with diverticulosis.  The patient had another episode of bloody stool and was transferred to stepdown unit for closer monitoring.   She has declined blood transfusion due to religious reasons as a Jehovah witness.    Assessment/Plan: Principal Problem:   GI bleed  GI bleed, history of diverticular disease and reflux esophagitis Less likely secondary to external hemorrhoids. Patient had EGD done on 07/03/2024.  Does not wish PRBC transfusion due to religious reasons.  Latest hemoglobin of 6.7.  GI has been consulted.  Continue IV PPI.  Has ongoing bleeding.  Spoke with GI at bedside.  Plan for IV iron .  If ongoing bleeding will need GI bleed scan.   Acute blood loss anemia in the setting of GI bleed Hemoglobin at baseline 13.2 on 03/05/2024, latest hemoglobin of 6.7.  Continue IV fluid hydration.  Continue to monitor vitals and hemoglobin.  Hypertension Continue to hold antihypertensives.  Latest blood pressure of 101/40   Mild AKI, prerenal Volume loss from GI bleed. Baseline creatinine 1.0, presented with  creatinine of 1.3. Creatinine has improved again.  Will continue to monitor BMP.   Syncope likely secondary to hypovolemia. Will continue IV fluids.    Intermittent chest pain. Troponin less than 15.  No ischemia on twelve-lead EKG.   History of stroke, Continue to hold aspirin .  History of sleep apnea.  Not on CPAP at home.   Obesity On Mounjaro , once weekly, last use was on Wednesday.  DVT prophylaxis: SCDs Start: 07/25/24 2217   Disposition: Home likely 1 to 2 days  Status is: Inpatient Remains inpatient appropriate because: Ongoing GI bleed,    Code Status:     Code Status: Limited: Do not attempt resuscitation (DNR) -DNR-LIMITED -Do Not Intubate/DNI   Family Communication: Sister on the phone  Consultants: GI  Procedures: None yet  Anti-infectives:  None  Anti-infectives (From admission, onward)    None      Subjective: Today, patient was seen and examined at bedside.  Nursing staff reported that patient did have bowel bright red blood.  Denies any dizziness lightheadedness shortness of breath chest pain or palpitation.  Objective: Vitals:   07/26/24 0700 07/26/24 0800  BP: (!) 103/51 131/78  Pulse: 73 76  Resp: 17 17  Temp:  98.1 F (36.7 C)  SpO2: 100% 100%    Intake/Output Summary (Last 24 hours) at 07/26/2024 0947 Last data filed at 07/26/2024 0800 Gross per 24 hour  Intake 1672.53 ml  Output --  Net 1672.53 ml   Filed Weights   07/26/24 0153  Weight: 75.9 kg   Body mass index is  31.62 kg/m.   Physical Exam:  GENERAL: Patient is alert awake and oriented. Not in obvious distress.  Obese built HENT: Pallor noted. Pupils equally reactive to light. Oral mucosa is moist NECK: is supple, no gross swelling noted. CHEST: Clear to auscultation. No crackles or wheezes.   CVS: S1 and S2 heard, no murmur. Regular rate and rhythm.  ABDOMEN: Soft, epigastric tenderness noted, bowel sounds are present. EXTREMITIES: No edema. CNS: Cranial  nerves are intact. No focal motor deficits. SKIN: warm and dry without rashes.  Data Review: I have personally reviewed the following laboratory data and studies,  CBC: Recent Labs  Lab 07/25/24 1345 07/25/24 2239 07/26/24 0454  WBC 6.3  --   --   HGB 10.9* 7.9* 6.7*  HCT 32.9* 25.1* 21.4*  MCV 94.5  --   --   PLT 280  --   --    Basic Metabolic Panel: Recent Labs  Lab 07/25/24 1345 07/26/24 0454  NA 139 141  K 3.8 4.0  CL 106 111  CO2 21* 23  GLUCOSE 143* 101*  BUN 18 19  CREATININE 1.34* 1.03*  CALCIUM  9.9 8.3*   Liver Function Tests: Recent Labs  Lab 07/25/24 1345  AST 19  ALT 23  ALKPHOS 53  BILITOT 0.4  PROT 6.7  ALBUMIN 3.9   No results for input(s): LIPASE, AMYLASE in the last 168 hours. No results for input(s): AMMONIA in the last 168 hours. Cardiac Enzymes: No results for input(s): CKTOTAL, CKMB, CKMBINDEX, TROPONINI in the last 168 hours. BNP (last 3 results) No results for input(s): BNP in the last 8760 hours.  ProBNP (last 3 results) No results for input(s): PROBNP in the last 8760 hours.  CBG: Recent Labs  Lab 07/25/24 2215  GLUCAP 104*   No results found for this or any previous visit (from the past 240 hours).   Studies: No results found.    Myrtle Barnhard, MD  [facility] 07/26/2024  If 7PM-7AM, please contact night-coverage

## 2024-07-26 NOTE — Progress Notes (Signed)
 Pt had two episodes of large bloody bowel movement since admission.MD notified. hgb 7.9.Alert and oriented.On room air. Reports chest tightness ECG completed. VS checked.BP 106/61.Npo since midnight. NS running at 75 cc/hr. Transferred to ICU room 1240 at 01:40.

## 2024-07-27 ENCOUNTER — Encounter (HOSPITAL_COMMUNITY)
Admission: EM | Disposition: A | Discharge status: Home Health | Payer: Self-pay | Source: Ambulatory Visit | Attending: Internal Medicine

## 2024-07-27 ENCOUNTER — Inpatient Hospital Stay (HOSPITAL_COMMUNITY): Admitting: Anesthesiology

## 2024-07-27 ENCOUNTER — Encounter (HOSPITAL_COMMUNITY): Payer: Self-pay | Admitting: Internal Medicine

## 2024-07-27 DIAGNOSIS — K920 Hematemesis: Secondary | ICD-10-CM | POA: Diagnosis not present

## 2024-07-27 DIAGNOSIS — I25119 Atherosclerotic heart disease of native coronary artery with unspecified angina pectoris: Secondary | ICD-10-CM | POA: Diagnosis not present

## 2024-07-27 DIAGNOSIS — D62 Acute posthemorrhagic anemia: Secondary | ICD-10-CM | POA: Diagnosis not present

## 2024-07-27 DIAGNOSIS — I10 Essential (primary) hypertension: Secondary | ICD-10-CM

## 2024-07-27 DIAGNOSIS — K921 Melena: Principal | ICD-10-CM

## 2024-07-27 DIAGNOSIS — K641 Second degree hemorrhoids: Secondary | ICD-10-CM | POA: Diagnosis not present

## 2024-07-27 DIAGNOSIS — K5791 Diverticulosis of intestine, part unspecified, without perforation or abscess with bleeding: Secondary | ICD-10-CM | POA: Diagnosis not present

## 2024-07-27 DIAGNOSIS — K5731 Diverticulosis of large intestine without perforation or abscess with bleeding: Secondary | ICD-10-CM | POA: Diagnosis not present

## 2024-07-27 DIAGNOSIS — K5730 Diverticulosis of large intestine without perforation or abscess without bleeding: Secondary | ICD-10-CM

## 2024-07-27 DIAGNOSIS — Z531 Procedure and treatment not carried out because of patient's decision for reasons of belief and group pressure: Secondary | ICD-10-CM | POA: Diagnosis not present

## 2024-07-27 DIAGNOSIS — D649 Anemia, unspecified: Secondary | ICD-10-CM

## 2024-07-27 DIAGNOSIS — R933 Abnormal findings on diagnostic imaging of other parts of digestive tract: Secondary | ICD-10-CM | POA: Diagnosis not present

## 2024-07-27 LAB — CBC
HCT: 16.2 % — ABNORMAL LOW (ref 36.0–46.0)
Hemoglobin: 5 g/dL — CL (ref 12.0–15.0)
MCH: 31.3 pg (ref 26.0–34.0)
MCHC: 30.9 g/dL (ref 30.0–36.0)
MCV: 101.3 fL — ABNORMAL HIGH (ref 80.0–100.0)
Platelets: 167 K/uL (ref 150–400)
RBC: 1.6 MIL/uL — ABNORMAL LOW (ref 3.87–5.11)
RDW: 13.6 % (ref 11.5–15.5)
WBC: 10.2 K/uL (ref 4.0–10.5)
nRBC: 0 % (ref 0.0–0.2)

## 2024-07-27 LAB — BASIC METABOLIC PANEL WITH GFR
Anion gap: 10 (ref 5–15)
BUN: 11 mg/dL (ref 8–23)
CO2: 20 mmol/L — ABNORMAL LOW (ref 22–32)
Calcium: 7.9 mg/dL — ABNORMAL LOW (ref 8.9–10.3)
Chloride: 111 mmol/L (ref 98–111)
Creatinine, Ser: 0.98 mg/dL (ref 0.44–1.00)
GFR, Estimated: >60 mL/min (ref >60)
Glucose, Bld: 101 mg/dL — ABNORMAL HIGH (ref 70–99)
Potassium: 3.8 mmol/L (ref 3.5–5.1)
Sodium: 141 mmol/L (ref 135–145)

## 2024-07-27 LAB — PROTIME-INR
INR: 1.2 (ref 0.8–1.2)
Prothrombin Time: 15.8 s — ABNORMAL HIGH (ref 11.4–15.2)

## 2024-07-27 LAB — MAGNESIUM: Magnesium: 1.8 mg/dL (ref 1.7–2.4)

## 2024-07-27 LAB — PHOSPHORUS: Phosphorus: 2.7 mg/dL (ref 2.5–4.6)

## 2024-07-27 SURGERY — SIGMOIDOSCOPY, FLEXIBLE
Anesthesia: Monitor Anesthesia Care

## 2024-07-27 MED ORDER — MIDAZOLAM HCL 2 MG/2ML IJ SOLN
INTRAMUSCULAR | Status: DC | PRN
  Administered 2024-07-27 (×2): 2 mg via INTRAVENOUS

## 2024-07-27 MED ORDER — LACTATED RINGERS IV SOLN
INTRAVENOUS | Status: DC | PRN
Start: 2024-07-27 — End: 2024-07-27

## 2024-07-27 MED ORDER — MORPHINE SULFATE (PF) 2 MG/ML IV SOLN
2.0000 mg | Freq: Four times a day (QID) | INTRAVENOUS | Status: AC | PRN
Start: 2024-07-27 — End: 2024-07-27
  Administered 2024-07-27 (×2): 2 mg via INTRAVENOUS
  Filled 2024-07-27 (×2): qty 1

## 2024-07-27 MED ORDER — MORPHINE SULFATE (PF) 2 MG/ML IV SOLN
2.0000 mg | Freq: Once | INTRAVENOUS | Status: AC
  Administered 2024-07-27: 2 mg via INTRAVENOUS
  Filled 2024-07-27: qty 1

## 2024-07-27 MED ORDER — SODIUM CHLORIDE 0.9 % IV SOLN: INTRAVENOUS | Status: AC

## 2024-07-27 MED ORDER — MORPHINE SULFATE (PF) 2 MG/ML IV SOLN
2.0000 mg | INTRAVENOUS | Status: AC | PRN
  Administered 2024-07-28 – 2024-07-31 (×3): 2 mg via INTRAVENOUS
  Filled 2024-07-27 (×3): qty 1

## 2024-07-27 MED ORDER — KETAMINE HCL 50 MG/5ML IJ SOSY
PREFILLED_SYRINGE | INTRAMUSCULAR | Status: AC
  Filled 2024-07-27: qty 5

## 2024-07-27 MED ORDER — KETAMINE HCL 10 MG/ML IJ SOLN
INTRAMUSCULAR | Status: DC | PRN
  Administered 2024-07-27 (×2): 20 mg via INTRAVENOUS

## 2024-07-27 MED ORDER — MORPHINE SULFATE (PF) 2 MG/ML IV SOLN
INTRAVENOUS | Status: AC
  Filled 2024-07-27: qty 1

## 2024-07-27 MED ORDER — MORPHINE SULFATE (PF) 2 MG/ML IV SOLN
2.0000 mg | Freq: Once | INTRAVENOUS | Status: AC
  Administered 2024-07-27: 2 mg via INTRAVENOUS

## 2024-07-27 MED ORDER — EPOETIN ALFA-EPBX 10000 UNIT/ML IJ SOLN
30000.0000 [IU] | Freq: Once | INTRAMUSCULAR | Status: AC
  Administered 2024-07-27: 30000 [IU] via SUBCUTANEOUS
  Filled 2024-07-27: qty 3

## 2024-07-27 MED ORDER — MIDAZOLAM HCL 2 MG/2ML IJ SOLN
INTRAMUSCULAR | Status: AC
  Filled 2024-07-27: qty 2

## 2024-07-27 MED ORDER — EPOETIN ALFA-EPBX 40000 UNIT/ML IJ SOLN
30000.0000 [IU] | Freq: Once | INTRAMUSCULAR | Status: DC
  Filled 2024-07-27: qty 1

## 2024-07-27 MED ORDER — EPOETIN ALFA NICU SYRINGE 2000 UNITS/ML: 400.0000 [IU]/kg | Freq: Once | INTRAMUSCULAR | Status: DC

## 2024-07-27 MED ORDER — SODIUM CHLORIDE 0.9 % IV SOLN: INTRAVENOUS | Status: DC

## 2024-07-27 NOTE — Progress Notes (Addendum)
 Gastroenterology Inpatient Follow-up Note   PATIENT IDENTIFICATION  Haley [name] is a 68 y.o. female Hospital Day: 3  SUBJECTIVE  The patient's chart has been reviewed.  She did not tolerate IV iron  infusion so it had to be stopped early.  She had multiple bloody bowel movements yesterday into the evening leading to CT angiography GI bleed scan (full results below).  Dr. Stacia came and evaluated her late last night as well with review of case with him as well. The patient's labs have been reviewed.  Her hemoglobin is 5.0. Today, the patient is waking up from sleep.  She has not had any further bowel movements over the course of the last 10 hours. She denies fevers or chills. She denies any new abdominal pain or discomfort.   OBJECTIVE   Scheduled Inpatient Medications:   Chlorhexidine  Gluconate Cloth  6 each Topical Daily   docusate sodium   100 mg Oral BID   pantoprazole  (PROTONIX ) IV  40 mg Intravenous BID   polyethylene glycol  17 g Oral Daily   Continuous Inpatient Infusions:   sodium chloride  75 mL/hr at 07/27/24 0900   sodium chloride      PRN Inpatient Medications: acetaminophen , HYDROcodone -acetaminophen , melatonin, midodrine , nitroGLYCERIN , ondansetron  (ZOFRAN ) IV   Physical Examination   Temp:  [97.9 F (36.6 C)-98.8 F (37.1 C)] 98.8 F (37.1 C) (09/20 1955) Pulse Rate:  [79-104] 86 (09/21 0900) Resp:  [15-30] 21 (09/21 0900) BP: (86-180)/(35-152) 143/53 (09/21 0900) SpO2:  [91 %-100 %] 100 % (09/21 0900) Temp (24hrs), Avg:98.4 F (36.9 C), Min:97.9 F (36.6 C), Max:98.8 F (37.1 C)  Weight: 75.9 kg GEN: NAD, appears stated age, doesn't appear chronically ill PSYCH: Cooperative, without pressured speech EYE: Conjunctivae pink, sclerae anicteric ENT: MMM CV: Nontachycardic RESP: No audible wheezing GI: NABS, soft, NT/ND, without rebound or guarding MSK/EXT: No significant lower extremity edema SKIN: No jaundice NEURO:  Alert & Oriented x 3, no  focal deficits   Review of Data   Laboratory Studies   Recent Labs  Lab 07/27/24 0508  NA 141  K 3.8  CL 111  CO2 20*  BUN 11  CREATININE 0.98  GLUCOSE 101*  CALCIUM  7.9*  MG 1.8  PHOS 2.7   Recent Labs  Lab 07/25/24 1345  AST 19  ALT 23  ALKPHOS 53    Recent Labs  Lab 07/25/24 1345 07/25/24 2239 07/27/24 0508  WBC 6.3  --  10.2  HGB 10.9*   < > 5.0*  HCT 32.9*   < > 16.2*  PLT 280  --  167   < > = values in this interval not displayed.   Recent Labs  Lab 07/25/24 1636 07/27/24 0508  INR 1.0 1.2   MELD 3.0: 9 at 07/27/2024  5:08 AM MELD-Na: 8 at 07/27/2024  5:08 AM Calculated from: Serum Creatinine: 0.98 mg/dL (Using min of 1 mg/dL) at 0/78/7974  4:91 AM Serum Sodium: 141 mmol/L (Using max of 137 mmol/L) at 07/27/2024  5:08 AM Total Bilirubin: 0.4 mg/dL (Using min of 1 mg/dL) at 0/80/7974  8:54 PM Serum Albumin: 3.9 g/dL (Using max of 3.5 g/dL) at 0/80/7974  8:54 PM INR(ratio): 1.2 at 07/27/2024  5:08 AM Age at listing (hypothetical): 68 years Sex: Female at 07/27/2024  5:08 AM   Imaging Studies   CT ANGIO GI BLEED Result Date: 07/26/2024 CLINICAL DATA:  GI bleed. EXAM: CTA ABDOMEN AND PELVIS WITHOUT AND WITH CONTRAST TECHNIQUE: Multidetector CT imaging of the abdomen and pelvis was performed  using the standard protocol during bolus administration of intravenous contrast. Multiplanar reconstructed images and MIPs were obtained and reviewed to evaluate the vascular anatomy. RADIATION DOSE REDUCTION: This exam was performed according to the departmental dose-optimization program which includes automated exposure control, adjustment of the mA and/or kV according to patient size and/or use of iterative reconstruction technique. CONTRAST:  OMNIPAQUE  IOHEXOL  350 MG/ML SOLN COMPARISON:  12/20/2023. FINDINGS: VASCULAR Aorta: Normal caliber aorta without aneurysm, dissection, vasculitis or significant stenosis. Aortic atherosclerosis. Celiac: Patent without  evidence of aneurysm, dissection, vasculitis or significant stenosis. SMA: Patent without evidence of aneurysm, dissection, vasculitis or significant stenosis. Renals: Both renal arteries are patent without evidence of aneurysm, dissection, vasculitis, fibromuscular dysplasia or significant stenosis. Accessory renal artery is noted on the left. IMA: Patent. There is hypervascularity in the region of the rectum with questionable intraluminal hyperdense material in the rectum on delayed phase, series 11 axial image 81. Inflow: Patent without evidence of aneurysm, dissection, vasculitis or significant stenosis. Proximal Outflow: Bilateral common femoral and visualized portions of the superficial and profunda femoral arteries are patent without evidence of aneurysm, dissection, vasculitis or significant stenosis. Veins: No obvious venous abnormality. Review of the MIP images confirms the above findings. NON-VASCULAR Lower chest: Coronary artery calcifications are present. The lung bases are clear. Hepatobiliary: No focal liver abnormality is seen. No gallstones, gallbladder wall thickening, or biliary dilatation. Pancreas: Unremarkable. No pancreatic ductal dilatation or surrounding inflammatory changes. Spleen: Normal in size without focal abnormality. Adrenals/Urinary Tract: There stable thickening in the left adrenal gland without discrete nodule. The right adrenal gland is within normal limits. The kidneys enhance symmetrically. No renal calculus or obstructive uropathy bilaterally. The bladder is unremarkable. Stomach/Bowel: There is a small hiatal hernia. The stomach is otherwise within normal limits. No bowel obstruction, free air, or pneumatosis is seen. Appendix appears normal. There are scattered diverticula along the colon without evidence of diverticulitis. Lymphatic: No abdominal or pelvic lymphadenopathy. Reproductive: Status post hysterectomy. No adnexal masses. Other: No abdominopelvic ascites. There is  a large complex fat containing periumbilical hernia in the midline. Musculoskeletal: Degenerative changes are present in the thoracolumbar spine. There sclerosis at the sacroiliac joints bilaterally, compatible with sacroiliitis. No acute osseous abnormality is seen. IMPRESSION: VASCULAR 1. Hypervascularity in the region of the rectum with questionable trace intraluminal contrast on delayed image, possible trace active bleeding. 2. Aortic atherosclerosis. 3. Coronary artery calcifications. NON-VASCULAR 1. Diverticulosis without diverticulitis. 2. Small hiatal hernia. 3. Large complex periumbilical hernia. Electronically Signed   By: Leita Birmingham M.D.   On: 07/26/2024 14:40   GI Procedures and Studies  No new procedures   ASSESSMENT  Ms. Weilbacher is a 68 y.o. female with a pmh significant for diabetes, hypertension, hyperlipidemia, prior TIA, MDD, esophageal stricture, diverticulosis.  The GI service is consulted for evaluation and management of acute blood loss anemia in the setting of rectal bleeding.   The patient is hemodynamically and clinically stable today.  Her hemogram has downtrended to 5.0 today.  She has not had any further GI bleeding for nearly 12 hours.  However CT scan suggested some hypervascularity in the region of the rectum with the possibility of active bleeding thus the consideration of performing a flexible sigmoidoscopy has come up.  The risks and benefits of endoscopic evaluation were discussed with the patient; these include but are not limited to the risk of perforation, infection, bleeding, missed lesions, lack of diagnosis, severe illness requiring hospitalization, as well as anesthesia and sedation related illnesses.  After extensive discussion with the patient, she is willing to move forward with flexible sigmoidoscopy to evaluate that area.  We still believe that diverticular hemorrhage is most likely but since the area should be within reach it is worth considering  sigmoidoscopy.  We also offered continued monitoring.  Unfortunately, as a result of her underlying religious beliefs, she has less ability to be supported since she does not receive blood transfusions.  After discussion with anesthesia, they do feel that they can at least give some baseline sedation as I also offered her a an unsedated procedure but she will not likely be fully asleep for sigmoidoscopy.  If there is something within reach or visible we will go after trying to treat that.  She will get a tapwater enema this morning and come down for sigmoidoscopy later this morning.  This patient as a result of her anemia is at increased risk for complications and we had extensive discussion and review of imaging and labs and discussion with the patient and her family as well.  All patient questions were answered to the best of my ability, and the patient agrees to the aforementioned plan of action with follow-up as indicated.   PLAN/RECOMMENDATIONS  Remain n.p.o. Proceed with tapwater enema Increased risk flexible sigmoidoscopy to be performed this morning to try to reach and evaluate area of abnormality noted on CT angio GI bleed scan Continue supportive measures with IV fluids Unfortunately she cannot tolerate IV iron  so oral iron  will need to be initiated at some point Further recommendations after sigmoidoscopy now that we have decided that is the plan of action   Please page/call with questions or concerns.   Aloha Finner, MD [facility] Office # [phone]    LOS: 2 days  Aloha Finner Raddle  07/27/2024, 9:26 AM

## 2024-07-27 NOTE — H&P (View-Only) (Signed)
 Gastroenterology Inpatient Follow-up Note   PATIENT IDENTIFICATION  Haley [name] is a 68 y.o. female Hospital Day: 3  SUBJECTIVE  The patient's chart has been reviewed.  She did not tolerate IV iron  infusion so it had to be stopped early.  She had multiple bloody bowel movements yesterday into the evening leading to CT angiography GI bleed scan (full results below).  Dr. Stacia came and evaluated her late last night as well with review of case with him as well. The patient's labs have been reviewed.  Her hemoglobin is 5.0. Today, the patient is waking up from sleep.  She has not had any further bowel movements over the course of the last 10 hours. She denies fevers or chills. She denies any new abdominal pain or discomfort.   OBJECTIVE   Scheduled Inpatient Medications:   Chlorhexidine  Gluconate Cloth  6 each Topical Daily   docusate sodium   100 mg Oral BID   pantoprazole  (PROTONIX ) IV  40 mg Intravenous BID   polyethylene glycol  17 g Oral Daily   Continuous Inpatient Infusions:   sodium chloride  75 mL/hr at 07/27/24 0900   sodium chloride      PRN Inpatient Medications: acetaminophen , HYDROcodone -acetaminophen , melatonin, midodrine , nitroGLYCERIN , ondansetron  (ZOFRAN ) IV   Physical Examination   Temp:  [97.9 F (36.6 C)-98.8 F (37.1 C)] 98.8 F (37.1 C) (09/20 1955) Pulse Rate:  [79-104] 86 (09/21 0900) Resp:  [15-30] 21 (09/21 0900) BP: (86-180)/(35-152) 143/53 (09/21 0900) SpO2:  [91 %-100 %] 100 % (09/21 0900) Temp (24hrs), Avg:98.4 F (36.9 C), Min:97.9 F (36.6 C), Max:98.8 F (37.1 C)  Weight: 75.9 kg GEN: NAD, appears stated age, doesn't appear chronically ill PSYCH: Cooperative, without pressured speech EYE: Conjunctivae pink, sclerae anicteric ENT: MMM CV: Nontachycardic RESP: No audible wheezing GI: NABS, soft, NT/ND, without rebound or guarding MSK/EXT: No significant lower extremity edema SKIN: No jaundice NEURO:  Alert & Oriented x 3, no  focal deficits   Review of Data   Laboratory Studies   Recent Labs  Lab 07/27/24 0508  NA 141  K 3.8  CL 111  CO2 20*  BUN 11  CREATININE 0.98  GLUCOSE 101*  CALCIUM  7.9*  MG 1.8  PHOS 2.7   Recent Labs  Lab 07/25/24 1345  AST 19  ALT 23  ALKPHOS 53    Recent Labs  Lab 07/25/24 1345 07/25/24 2239 07/27/24 0508  WBC 6.3  --  10.2  HGB 10.9*   < > 5.0*  HCT 32.9*   < > 16.2*  PLT 280  --  167   < > = values in this interval not displayed.   Recent Labs  Lab 07/25/24 1636 07/27/24 0508  INR 1.0 1.2   MELD 3.0: 9 at 07/27/2024  5:08 AM MELD-Na: 8 at 07/27/2024  5:08 AM Calculated from: Serum Creatinine: 0.98 mg/dL (Using min of 1 mg/dL) at 0/78/7974  4:91 AM Serum Sodium: 141 mmol/L (Using max of 137 mmol/L) at 07/27/2024  5:08 AM Total Bilirubin: 0.4 mg/dL (Using min of 1 mg/dL) at 0/80/7974  8:54 PM Serum Albumin: 3.9 g/dL (Using max of 3.5 g/dL) at 0/80/7974  8:54 PM INR(ratio): 1.2 at 07/27/2024  5:08 AM Age at listing (hypothetical): 68 years Sex: Female at 07/27/2024  5:08 AM   Imaging Studies   CT ANGIO GI BLEED Result Date: 07/26/2024 CLINICAL DATA:  GI bleed. EXAM: CTA ABDOMEN AND PELVIS WITHOUT AND WITH CONTRAST TECHNIQUE: Multidetector CT imaging of the abdomen and pelvis was performed  using the standard protocol during bolus administration of intravenous contrast. Multiplanar reconstructed images and MIPs were obtained and reviewed to evaluate the vascular anatomy. RADIATION DOSE REDUCTION: This exam was performed according to the departmental dose-optimization program which includes automated exposure control, adjustment of the mA and/or kV according to patient size and/or use of iterative reconstruction technique. CONTRAST:  OMNIPAQUE  IOHEXOL  350 MG/ML SOLN COMPARISON:  12/20/2023. FINDINGS: VASCULAR Aorta: Normal caliber aorta without aneurysm, dissection, vasculitis or significant stenosis. Aortic atherosclerosis. Celiac: Patent without  evidence of aneurysm, dissection, vasculitis or significant stenosis. SMA: Patent without evidence of aneurysm, dissection, vasculitis or significant stenosis. Renals: Both renal arteries are patent without evidence of aneurysm, dissection, vasculitis, fibromuscular dysplasia or significant stenosis. Accessory renal artery is noted on the left. IMA: Patent. There is hypervascularity in the region of the rectum with questionable intraluminal hyperdense material in the rectum on delayed phase, series 11 axial image 81. Inflow: Patent without evidence of aneurysm, dissection, vasculitis or significant stenosis. Proximal Outflow: Bilateral common femoral and visualized portions of the superficial and profunda femoral arteries are patent without evidence of aneurysm, dissection, vasculitis or significant stenosis. Veins: No obvious venous abnormality. Review of the MIP images confirms the above findings. NON-VASCULAR Lower chest: Coronary artery calcifications are present. The lung bases are clear. Hepatobiliary: No focal liver abnormality is seen. No gallstones, gallbladder wall thickening, or biliary dilatation. Pancreas: Unremarkable. No pancreatic ductal dilatation or surrounding inflammatory changes. Spleen: Normal in size without focal abnormality. Adrenals/Urinary Tract: There stable thickening in the left adrenal gland without discrete nodule. The right adrenal gland is within normal limits. The kidneys enhance symmetrically. No renal calculus or obstructive uropathy bilaterally. The bladder is unremarkable. Stomach/Bowel: There is a small hiatal hernia. The stomach is otherwise within normal limits. No bowel obstruction, free air, or pneumatosis is seen. Appendix appears normal. There are scattered diverticula along the colon without evidence of diverticulitis. Lymphatic: No abdominal or pelvic lymphadenopathy. Reproductive: Status post hysterectomy. No adnexal masses. Other: No abdominopelvic ascites. There is  a large complex fat containing periumbilical hernia in the midline. Musculoskeletal: Degenerative changes are present in the thoracolumbar spine. There sclerosis at the sacroiliac joints bilaterally, compatible with sacroiliitis. No acute osseous abnormality is seen. IMPRESSION: VASCULAR 1. Hypervascularity in the region of the rectum with questionable trace intraluminal contrast on delayed image, possible trace active bleeding. 2. Aortic atherosclerosis. 3. Coronary artery calcifications. NON-VASCULAR 1. Diverticulosis without diverticulitis. 2. Small hiatal hernia. 3. Large complex periumbilical hernia. Electronically Signed   By: Leita Birmingham M.D.   On: 07/26/2024 14:40   GI Procedures and Studies  No new procedures   ASSESSMENT  Haley [name] is a 68 y.o. female with a pmh significant for diabetes, hypertension, hyperlipidemia, prior TIA, MDD, esophageal stricture, diverticulosis.  The GI service is consulted for evaluation and management of acute blood loss anemia in the setting of rectal bleeding.   The patient is hemodynamically and clinically stable today.  Her hemogram has downtrended to 5.0 today.  She has not had any further GI bleeding for nearly 12 hours.  However CT scan suggested some hypervascularity in the region of the rectum with the possibility of active bleeding thus the consideration of performing a flexible sigmoidoscopy has come up.  The risks and benefits of endoscopic evaluation were discussed with the patient; these include but are not limited to the risk of perforation, infection, bleeding, missed lesions, lack of diagnosis, severe illness requiring hospitalization, as well as anesthesia and sedation related illnesses.  After extensive discussion with the patient, she is willing to move forward with flexible sigmoidoscopy to evaluate that area.  We still believe that diverticular hemorrhage is most likely but since the area should be within reach it is worth considering  sigmoidoscopy.  We also offered continued monitoring.  Unfortunately, as a result of her underlying religious beliefs, she has less ability to be supported since she does not receive blood transfusions.  After discussion with anesthesia, they do feel that they can at least give some baseline sedation as I also offered her a an unsedated procedure but she will not likely be fully asleep for sigmoidoscopy.  If there is something within reach or visible we will go after trying to treat that.  She will get a tapwater enema this morning and come down for sigmoidoscopy later this morning.  This patient as a result of her anemia is at increased risk for complications and we had extensive discussion and review of imaging and labs and discussion with the patient and her family as well.  All patient questions were answered to the best of my ability, and the patient agrees to the aforementioned plan of action with follow-up as indicated.   PLAN/RECOMMENDATIONS  Remain n.p.o. Proceed with tapwater enema Increased risk flexible sigmoidoscopy to be performed this morning to try to reach and evaluate area of abnormality noted on CT angio GI bleed scan Continue supportive measures with IV fluids Unfortunately she cannot tolerate IV iron  so oral iron  will need to be initiated at some point Further recommendations after sigmoidoscopy now that we have decided that is the plan of action   Please page/call with questions or concerns.   Aloha Finner, MD [facility] Office # [phone]    LOS: 2 days  Aloha Finner Raddle  07/27/2024, 9:26 AM

## 2024-07-27 NOTE — Op Note (Signed)
 [facility] Patient Name: Haley [name] Procedure Date: 07/27/2024 MRN: [id number] Attending MD: Aloha Finner , MD, [id number] Date of Birth: December 07, 1955 CSN: [id number] Age: 68 Admit Type: Inpatient Procedure:                Flexible Sigmoidoscopy Indications:              Rectal hemorrhage, Hematochezia Providers:                Aloha Finner, MD, Randall Lines, RN, Lorrayne Kitty, Technician Referring MD:              Medicines:                Monitored Anesthesia Care Complications:            No immediate complications. Estimated Blood Loss:     Estimated blood loss: none. Procedure:                Pre-Anesthesia Assessment:                           - Prior to the procedure, a History and Physical                            was performed, and patient medications and                            allergies were reviewed. The patient's tolerance of                            previous anesthesia was also reviewed. The risks                            and benefits of the procedure and the sedation                            options and risks were discussed with the patient.                            All questions were answered, and informed consent                            was obtained. Prior Anticoagulants: The patient has                            taken no anticoagulant or antiplatelet agents. ASA                            Grade Assessment: III - A patient with severe                            systemic disease. After reviewing the risks and  benefits, the patient was deemed in satisfactory                            condition to undergo the procedure.                           After obtaining informed consent, the scope was                            passed under direct vision. The [id number]                            ([id number]) Olympus endoscope was introduced through                            the  anus and advanced to the the left transverse                            colon. The flexible sigmoidoscopy was accomplished                            without difficulty. The patient tolerated the                            procedure. The quality of the bowel preparation was                            inadequate. Scope In: 11:08:12 AM Scope Out: 11:28:07 AM Total Procedure Duration: 0 hours 19 minutes 55 seconds  Findings:      The digital rectal exam findings include hemorrhoids. Pertinent       negatives include no palpable rectal lesions.      A large amount of semi-solid dark-brown/purple stool was found in the       transverse colon.      Clotted blood and old maroonish blood admixed with stool was found in       the entire left colon visualized. Lavage of the area was performed using       copious amounts, resulting in incomplete clearance without findings of       active extravasation anywhere.      Many medium-mouthed and small-mouthed diverticula were found in the       entire colon. Lavaged as able to try to see any extravasation and none       noted.      Non-bleeding non-thrombosed internal hemorrhoids were found during       retroflexion, during perianal exam and during digital exam. The       hemorrhoids were Grade II (internal hemorrhoids that prolapse but reduce       spontaneously). Impression:               - Preparation of the colon was inadequate (this was                            due to this being a FS with only tap water  enema).                           -  Hemorrhoids found on digital rectal exam.                           - Stool in the transverse colon (more purple/dark                            brown) semi-formed.                           - Blood and clots in the entire examined left colon.                           - Diverticulosis in the entire examined colon.                           - No evidence of active extravasation has been                             noted upon our examination today.                           - Non-bleeding non-thrombosed internal hemorrhoids.                           - Presumption is that this has been a diverticular                            hemorrhage. Moderate Sedation:      Not Applicable - Patient had care per Anesthesia. Recommendation:           - The patient will be observed post-procedure,                            until all discharge criteria are met.                           - Return patient to hospital ward for ongoing care.                           - Trend Hgb/Hct Q12H for now.                           - Monitor patient. She will have old blood make its                            way out over the course of the next 12-24 hours.                           - Distinction will be if patient has significant                            hematochezia with hemodynamic changes, she will                            need  to go back for a CT-Angiography GI bleed scan.                            If issues with stuttering in the future, will need                            to consider (as long as stable) a GI nuclear Tagged                            RBC scan (can only be performed during the week                            during normal hours of day).                           - Trying to perform full colonoscopy at this time,                            not clearly indicated with last procedure within                            the last 4-years and what appears currently as no                            active extravasation in the colon on this                            examination (though things can stutter) - so will                            need to be monitored.                           - Inpatient [facility] will followup tomorrow.                           - She does not received transfusions due to                            religious beliefs and she did not tolerate IV Iron                              infusion last night. Her supportive management is                            complex as a result of this. She will need oral                            Iron  at some point (holding for now though).                           - Full Liquid Diet today.                           -  The findings and recommendations were discussed                            with the patient.                           - The findings and recommendations were discussed                            with the designated responsible adult. Procedure Code(s):        --- Professional ---                           [phone], Sigmoidoscopy, flexible; diagnostic,                            including collection of specimen(s) by brushing or                            washing, when performed (separate procedure) Diagnosis Code(s):        --- Professional ---                           K64.1, Second degree hemorrhoids                           K92.2, Gastrointestinal hemorrhage, unspecified                           K62.5, Hemorrhage of anus and rectum                           K92.1, Melena (includes Hematochezia)                           K57.30, Diverticulosis of large intestine without                            perforation or abscess without bleeding CPT copyright 2022 [employer]. All rights reserved. The codes documented in this report are preliminary and upon coder review may  be revised to meet current compliance requirements. Aloha Finner, MD 07/27/2024 11:39:52 AM Number of Addenda: 0

## 2024-07-27 NOTE — Interval H&P Note (Signed)
 History and Physical Interval Note:  07/27/2024 9:58 AM  Haley [name]  has presented today for surgery, with the diagnosis of GI bleeding acute blood loss anemia.  The various methods of treatment have been discussed with the patient and family. After consideration of risks, benefits and other options for treatment, the patient has consented to  Procedure(s): SIGMOIDOSCOPY, FLEXIBLE (N/A) as a surgical intervention.  The patient's history has been reviewed, patient examined, no change in status, stable for surgery.  I have reviewed the patient's chart and labs.  Questions were answered to the patient's satisfaction.     Dabney Schanz Mansouraty Jr

## 2024-07-27 NOTE — Transfer of Care (Signed)
 Immediate Anesthesia Transfer of Care Note  Patient: Haley [name]  Procedure(s) Performed: [facility] SIDE  Patient Location: PACU  Anesthesia Type:MAC  Level of Consciousness: drowsy  Airway & Oxygen Therapy: Patient Spontanous Breathing and Patient connected to nasal cannula oxygen  Post-op Assessment: Report given to RN and Post -op Vital signs reviewed and stable  Post vital signs: Reviewed and stable  Last Vitals:  Vitals Value Taken Time  BP 142/61 07/27/24 11:30  Temp    Pulse    Resp 17 07/27/24 11:30  SpO2 100 % 07/27/24 11:30    Last Pain:  Vitals:   07/27/24 1130  TempSrc: Temporal  PainSc:       Patients Stated Pain Goal: 0 (07/27/24 1051)  Complications: No notable events documented.

## 2024-07-27 NOTE — Progress Notes (Signed)
 Providers wanted to reattempt iron  infusion. Started infusion at 2058 at half the ordered rate, connected to patient's saline infusion to further dilute. Patient rang out at 2118 complaining of chest pain, tightness and heaviness with impending doom, and left arm heaviness and numbness. BP checked, noted to have increased by 30 points.   2120: Symptoms better, restarted at 66 mL/hr 2130: Symptoms restarted, iron  paused 2205: symptoms resolved, BP back to baseline, restarted at 20 mL/hr 2210: Symptoms restarted 2213: Symptoms stopped, iron  restarted 2221: Symptoms returned, iron  turned off and disconnected.   Chest symptoms worsened at 2230 despite stopped iron  gtt. Supportive O2 added, patient repositioned. Patient's symptoms began to get worse until at 2330 patient was visibly uncomfortable, rating chest pain 10/10, endorsing tightness and shortness of breath. Contacted Opyd, MD with request for morphine . Patient's SPB noted to be 170. Morphine  given at 2342 with symptom resolution.    Iron  Sucrose added to adverse reactions in her chart. Noted intolerance is currently IV only.

## 2024-07-27 NOTE — Anesthesia Preprocedure Evaluation (Signed)
 Anesthesia Evaluation  Patient identified by MRN, date of birth, ID band Patient awake    Reviewed: Allergy & Precautions, NPO status , Patient's Chart, lab work & pertinent test results  History of Anesthesia Complications Negative for: history of anesthetic complications  Airway Mallampati: III  TM Distance: >3 FB Neck ROM: Full    Dental  (+) Dental Advisory Given, Partial Upper   Pulmonary neg shortness of breath, neg sleep apnea, neg COPD, neg recent URI, neg PE   breath sounds clear to auscultation       Cardiovascular hypertension, Pt. on medications and Pt. on home beta blockers + angina  + CAD  (-) CHF  Rhythm:Regular  1. Left ventricular ejection fraction, by estimation, is 55 to 60%. The  left ventricle has normal function. The left ventricle has no regional  wall motion abnormalities. Left ventricular diastolic parameters were  normal.   2. Right ventricular systolic function is normal. The right ventricular  size is normal.   3. The mitral valve is normal in structure. No evidence of mitral valve  regurgitation. No evidence of mitral stenosis.   4. The aortic valve is tricuspid. There is mild calcification of the  aortic valve. There is mild thickening of the aortic valve. Aortic valve  regurgitation is not visualized. Aortic valve sclerosis is present, with  no evidence of aortic valve stenosis.   5. The inferior vena cava is normal in size with greater than 50%  respiratory variability, suggesting right atrial pressure of 3 mmHg.      Ost LAD to Prox LAD lesion is 30% stenosed.  RPDA lesion is 50% stenosed.  The left ventricular systolic function is normal.  LV end diastolic pressure is mildly elevated.  The left ventricular ejection fraction is 55-65% by visual estimate.   1. Nonobstructive CAD. Unchanged from 2017. 2. Normal LV function 3. Mildly elevated LVEDP 20 mm Hg        Neuro/Psych   PSYCHIATRIC DISORDERS  Depression     Neuromuscular disease CVA, No Residual Symptoms    GI/Hepatic Neg liver ROS,GERD  Medicated,,? GI bleed   Endo/Other  diabetes    Renal/GU Lab Results      Component                Value               Date                      NA                       141                 07/27/2024                K                        3.8                 07/27/2024                CO2                      20 (L)              07/27/2024                GLUCOSE  101 (H)             07/27/2024                BUN                      11                  07/27/2024                CREATININE               0.98                07/27/2024                CALCIUM                   7.9 (L)             07/27/2024                EGFR                     55 (L)              03/20/2024                GFRNONAA                 >60                 07/27/2024                Musculoskeletal negative musculoskeletal ROS (+)    Abdominal   Peds  Hematology  (+) Blood dyscrasia, anemia , REFUSES BLOOD PRODUCTS, JEHOVAH'S WITNESSLab Results      Component                Value               Date                      WBC                      10.2                07/27/2024                HGB                      5.0 (LL)            07/27/2024                HCT                      16.2 (L)            07/27/2024                MCV                      101.3 (H)           07/27/2024                PLT                      167  07/27/2024              Anesthesia Other Findings   Reproductive/Obstetrics                              Anesthesia Physical Anesthesia Plan  ASA: 4  Anesthesia Plan: MAC   Post-op Pain Management: Minimal or no pain anticipated and Ketamine  IV*   Induction: Intravenous  PONV Risk Score and Plan: 2 and Midazolam   Airway Management Planned: Nasal Cannula, Natural Airway and Simple Face  Mask  Additional Equipment:   Intra-op Plan:   Post-operative Plan:   Informed Consent: I have reviewed the patients History and Physical, chart, labs and discussed the procedure including the risks, benefits and alternatives for the proposed anesthesia with the patient or authorized representative who has indicated his/her understanding and acceptance.     Dental advisory given  Plan Discussed with: CRNA  Anesthesia Plan Comments:          Anesthesia Quick Evaluation

## 2024-07-27 NOTE — Plan of Care (Signed)
  Problem: Clinical Measurements: Goal: Ability to maintain clinical measurements within normal limits will improve Outcome: Progressing Goal: Will remain free from infection Outcome: Progressing Goal: Respiratory complications will improve Outcome: Progressing Goal: Cardiovascular complication will be avoided Outcome: Progressing   Problem: Activity: Goal: Risk for activity intolerance will decrease Outcome: Progressing   Problem: Coping: Goal: Level of anxiety will decrease Outcome: Progressing   Problem: Elimination: Goal: Will not experience complications related to bowel motility Outcome: Progressing Goal: Will not experience complications related to urinary retention Outcome: Progressing   Problem: Clinical Measurements: Goal: Diagnostic test results will improve Outcome: Not Progressing

## 2024-07-27 NOTE — Progress Notes (Signed)
 Patient began having 10/10 chest pain at approximately 1640, with chest pressure and tightness similar to previous events on 9/20. This RN spoke on the phone with MD, morphine  2 mg was ordered by MD and given by this RN. Pain subsided within 5 minutes of administering of morphine .

## 2024-07-27 NOTE — Anesthesia Postprocedure Evaluation (Signed)
 Anesthesia Post Note  Patient: Haley [name]  Procedure(s) Performed: [facility] SIDE     Patient location during evaluation: Endoscopy Anesthesia Type: MAC Level of consciousness: awake and alert Pain management: pain level controlled Vital Signs Assessment: post-procedure vital signs reviewed and stable Respiratory status: spontaneous breathing, nonlabored ventilation, respiratory function stable and patient connected to nasal cannula oxygen Cardiovascular status: stable and blood pressure returned to baseline Postop Assessment: no apparent nausea or vomiting Anesthetic complications: no   No notable events documented.                Racquelle Hyser

## 2024-07-27 NOTE — Progress Notes (Signed)
 PROGRESS NOTE  [name] FMW:[id number] DOB: May 13, 1956 DOA: 07/25/2024 PCP: Adella Norris, MD   LOS: 2 days   Brief narrative:  Haley [name] is a 68 y.o. female with medical history significant for essential hypertension, GERD, reflux esophagitis, esophageal stenosis post dilatation on 07/03/2024, obesity on Mounjaro , extensive diverticular disease, who presents to the ED with complaints of rectal bleeding for 1 day with multiple episodes..  Last colonoscopy 3 years ago when she had hemorrhoids.  Recently was on PPI for reflux esophagitis but denied any NSAID usage.  Patient also had exertional chest tightness and mild dyspnea while walking.   In the ED, patient was afebrile.  Blood pressure was marginally low.  Troponin was less than 15.  Creatinine 1.3 from baseline 1.0.    Initial hemoglobin was 10.9.   Repeat hemoglobin 6.7 from 10.9.  CT scan of the abdomen with diverticulosis.  The patient had another episode of bloody stool and was transferred to stepdown unit for closer monitoring.   She has declined blood transfusion due to religious reasons as a Jehovah witness.    Assessment/Plan: Principal Problem:   GI bleed Active Problems:   Hematochezia   Anemia  GI bleed Patient had EGD done on 07/03/2024.  Does not wish PRBC transfusion due to religious reasons.  Latest hemoglobin of 5.0. GI on board and underwent flex sigmoidoscopy today with findings of clotted blood and old maroonish black stool in the entire left colon without any acute extravasation but multiple diverticuli noted about the entire colon.  Nonbleeding nonthrombosed internal hemorrhoids were also found.  Thought to be diverticular bleed at this time.  Tried IV iron  twice but could not take it due to to hypersensitivity reaction.  At this time we will try Epogen .    Acute blood loss anemia in the setting of GI bleed Hemoglobin at baseline 13.2 on 03/05/2024, latest hemoglobin of 5.0.  Continue IV fluid  hydration.  Continue to monitor vitals and hemoglobin.  Try to conserve blood if possible.  Will give Epogen  today.  Hypertension Continue to hold antihypertensives.  Latest blood pressure of 118/51   Mild AKI, prerenal Volume loss from GI bleed. Baseline creatinine 1.0, creatinine today at 0.9.   Syncope likely secondary to hypovolemia. Improved at this time.  Patient asymptomatic.   Intermittent chest pain. Troponin less than 15.  No ischemia on twelve-lead EKG.   History of stroke, Continue to hold aspirin .  History of sleep apnea.  Not on CPAP at home.   Obesity On Mounjaro , once weekly, last use was on Wednesday.  DVT prophylaxis: SCDs Start: 07/25/24 2217   Disposition: Home likely 1 to 2 days  Status is: Inpatient Remains inpatient appropriate because: Ongoing GI bleed, needs closer monitoring, significant anemia,    Code Status:     Code Status: Full Code  Family Communication: With the patient's daughter at length yesterday on 07/26/2024  Consultants: GI  Procedures: flexible sigmoidoscopy 9/20//2025  Anti-infectives:  None  Anti-infectives (From admission, onward)    None      Subjective: Today, patient was seen and examined at bedside.  Seen after flexible sigmoidoscopy.  Patient denies any dyspnea, chest pain, shortness of breath.  Objective: Vitals:   07/27/24 1300 07/27/24 1400  BP: (!) 138/57 (!) 118/51  Pulse: 79 73  Resp: 15 17  Temp:    SpO2: 100% 100%    Intake/Output Summary (Last 24 hours) at 07/27/2024 1430 Last data filed at 07/27/2024 1300 Gross per 24  hour  Intake 1949.61 ml  Output --  Net 1949.61 ml   Filed Weights   07/26/24 0153 07/27/24 1051  Weight: 75.9 kg 75.9 kg   Body mass index is 31.62 kg/m.   Physical Exam:  GENERAL: Patient is alert awake and oriented. Not in obvious distress.  Obese built HENT: Pallor noted. Pupils equally reactive to light. Oral mucosa is moist NECK: is supple, no gross swelling  noted. CHEST: Clear to auscultation. No crackles or wheezes.   CVS: S1 and S2 heard, no murmur. Regular rate and rhythm.  ABDOMEN: Soft, non-tender, bowel sounds are present. EXTREMITIES: No edema. CNS: Cranial nerves are intact. No focal motor deficits. SKIN: warm and dry without rashes.  Data Review: I have personally reviewed the following laboratory data and studies,  CBC: Recent Labs  Lab 07/25/24 1345 07/25/24 2239 07/26/24 0454 07/26/24 0957 07/26/24 1757 07/27/24 0508  WBC 6.3  --   --   --   --  10.2  HGB 10.9* 7.9* 6.7* 6.8* 5.9* 5.0*  HCT 32.9* 25.1* 21.4* 21.4* 19.1* 16.2*  MCV 94.5  --   --   --   --  101.3*  PLT 280  --   --   --   --  167   Basic Metabolic Panel: Recent Labs  Lab 07/25/24 1345 07/26/24 0454 07/27/24 0508  NA 139 141 141  K 3.8 4.0 3.8  CL 106 111 111  CO2 21* 23 20*  GLUCOSE 143* 101* 101*  BUN 18 19 11   CREATININE 1.34* 1.03* 0.98  CALCIUM  9.9 8.3* 7.9*  MG  --   --  1.8  PHOS  --   --  2.7   Liver Function Tests: Recent Labs  Lab 07/25/24 1345  AST 19  ALT 23  ALKPHOS 53  BILITOT 0.4  PROT 6.7  ALBUMIN 3.9   No results for input(s): LIPASE, AMYLASE in the last 168 hours. No results for input(s): AMMONIA in the last 168 hours. Cardiac Enzymes: No results for input(s): CKTOTAL, CKMB, CKMBINDEX, TROPONINI in the last 168 hours. BNP (last 3 results) No results for input(s): BNP in the last 8760 hours.  ProBNP (last 3 results) No results for input(s): PROBNP in the last 8760 hours.  CBG: Recent Labs  Lab 07/25/24 2215 07/26/24 1232  GLUCAP 104* 115*   Recent Results (from the past 240 hours)  MRSA Next Gen by PCR, Nasal     Status: None   Collection Time: 07/26/24  9:33 AM   Specimen: Nasal Mucosa; Nasal Swab  Result Value Ref Range Status   MRSA by PCR Next Gen NOT DETECTED NOT DETECTED Final    Comment: (NOTE) The GeneXpert MRSA Assay (FDA approved for NASAL specimens only), is one component  of a comprehensive MRSA colonization surveillance program. It is not intended to diagnose MRSA infection nor to guide or monitor treatment for MRSA infections. Test performance is not FDA approved in patients less than 70 years old. Performed at [facility], [address], [place], [place] [address]      Studies: CT ANGIO GI BLEED Result Date: 07/26/2024 CLINICAL DATA:  GI bleed. EXAM: CTA ABDOMEN AND PELVIS WITHOUT AND WITH CONTRAST TECHNIQUE: Multidetector CT imaging of the abdomen and pelvis was performed using the standard protocol during bolus administration of intravenous contrast. Multiplanar reconstructed images and MIPs were obtained and reviewed to evaluate the vascular anatomy. RADIATION DOSE REDUCTION: This exam was performed according to the departmental dose-optimization program which  includes automated exposure control, adjustment of the mA and/or kV according to patient size and/or use of iterative reconstruction technique. CONTRAST:  OMNIPAQUE  IOHEXOL  350 MG/ML SOLN COMPARISON:  12/20/2023. FINDINGS: VASCULAR Aorta: Normal caliber aorta without aneurysm, dissection, vasculitis or significant stenosis. Aortic atherosclerosis. Celiac: Patent without evidence of aneurysm, dissection, vasculitis or significant stenosis. SMA: Patent without evidence of aneurysm, dissection, vasculitis or significant stenosis. Renals: Both renal arteries are patent without evidence of aneurysm, dissection, vasculitis, fibromuscular dysplasia or significant stenosis. Accessory renal artery is noted on the left. IMA: Patent. There is hypervascularity in the region of the rectum with questionable intraluminal hyperdense material in the rectum on delayed phase, series 11 axial image 81. Inflow: Patent without evidence of aneurysm, dissection, vasculitis or significant stenosis. Proximal Outflow: Bilateral common femoral and visualized portions of the superficial and profunda femoral  arteries are patent without evidence of aneurysm, dissection, vasculitis or significant stenosis. Veins: No obvious venous abnormality. Review of the MIP images confirms the above findings. NON-VASCULAR Lower chest: Coronary artery calcifications are present. The lung bases are clear. Hepatobiliary: No focal liver abnormality is seen. No gallstones, gallbladder wall thickening, or biliary dilatation. Pancreas: Unremarkable. No pancreatic ductal dilatation or surrounding inflammatory changes. Spleen: Normal in size without focal abnormality. Adrenals/Urinary Tract: There stable thickening in the left adrenal gland without discrete nodule. The right adrenal gland is within normal limits. The kidneys enhance symmetrically. No renal calculus or obstructive uropathy bilaterally. The bladder is unremarkable. Stomach/Bowel: There is a small hiatal hernia. The stomach is otherwise within normal limits. No bowel obstruction, free air, or pneumatosis is seen. Appendix appears normal. There are scattered diverticula along the colon without evidence of diverticulitis. Lymphatic: No abdominal or pelvic lymphadenopathy. Reproductive: Status post hysterectomy. No adnexal masses. Other: No abdominopelvic ascites. There is a large complex fat containing periumbilical hernia in the midline. Musculoskeletal: Degenerative changes are present in the thoracolumbar spine. There sclerosis at the sacroiliac joints bilaterally, compatible with sacroiliitis. No acute osseous abnormality is seen. IMPRESSION: VASCULAR 1. Hypervascularity in the region of the rectum with questionable trace intraluminal contrast on delayed image, possible trace active bleeding. 2. Aortic atherosclerosis. 3. Coronary artery calcifications. NON-VASCULAR 1. Diverticulosis without diverticulitis. 2. Small hiatal hernia. 3. Large complex periumbilical hernia. Electronically Signed   By: Leita Birmingham M.D.   On: 07/26/2024 14:40      Vernal Alstrom, MD  [facility] 07/27/2024  If 7PM-7AM, please contact night-coverage

## 2024-07-28 ENCOUNTER — Encounter (HOSPITAL_COMMUNITY): Payer: Self-pay | Admitting: Gastroenterology

## 2024-07-28 ENCOUNTER — Inpatient Hospital Stay (HOSPITAL_COMMUNITY)

## 2024-07-28 DIAGNOSIS — K5731 Diverticulosis of large intestine without perforation or abscess with bleeding: Secondary | ICD-10-CM

## 2024-07-28 DIAGNOSIS — Z531 Procedure and treatment not carried out because of patient's decision for reasons of belief and group pressure: Secondary | ICD-10-CM | POA: Diagnosis not present

## 2024-07-28 DIAGNOSIS — R933 Abnormal findings on diagnostic imaging of other parts of digestive tract: Secondary | ICD-10-CM

## 2024-07-28 DIAGNOSIS — K625 Hemorrhage of anus and rectum: Secondary | ICD-10-CM | POA: Diagnosis not present

## 2024-07-28 DIAGNOSIS — D62 Acute posthemorrhagic anemia: Secondary | ICD-10-CM | POA: Diagnosis not present

## 2024-07-28 LAB — HEMOGLOBIN AND HEMATOCRIT, BLOOD
HCT: 14.5 % — ABNORMAL LOW (ref 36.0–46.0)
Hemoglobin: 4.3 g/dL — CL (ref 12.0–15.0)

## 2024-07-28 MED ORDER — VITAMIN B-12 1000 MCG PO TABS
1000.0000 ug | ORAL_TABLET | Freq: Every day | ORAL | Status: DC
  Administered 2024-07-28 – 2024-08-01 (×5): 1000 ug via ORAL
  Filled 2024-07-28 (×5): qty 1

## 2024-07-28 MED ORDER — SODIUM CHLORIDE 0.9 % IV SOLN: INTRAVENOUS | Status: AC

## 2024-07-28 MED ORDER — FERROUS SULFATE 325 (65 FE) MG PO TABS
325.0000 mg | ORAL_TABLET | Freq: Two times a day (BID) | ORAL | Status: DC
  Administered 2024-07-28 – 2024-08-01 (×8): 325 mg via ORAL
  Filled 2024-07-28 (×8): qty 1

## 2024-07-28 MED ORDER — ORAL CARE MOUTH RINSE: 15.0000 mL | OROMUCOSAL | Status: DC | PRN

## 2024-07-28 NOTE — Progress Notes (Addendum)
       Overnight   NAME: Haley [name] MRN: [id number] DOB : 1956-05-21    Date of Service   07/28/2024   HPI/Events of Note    Notified by RN for unwitnessed fall.  Brief history 68 year old female medical history of essential hypertension, GERD, reflux esophagitis, esophageal stenosis post dilation on 07/03/2024, obesity on Mounjaro , extensive diverticular disease.  Admitted through ER for complaints of rectal bleeding x 1 day with multiple episodes.  Bedside visit Nursing staff reports hearing loud thump at which point RN went into check on patient found patient lying prone on the floor.  Patient was assisted back to bed. There is no: Deformities, contusions, abrasions, punctures, bruising, tears, lacerations, swelling. She is awake and oriented, but unaware to tell us  if she hit her head or not. Patient advised nursing staff that she was attempting to go to the bathroom. Movement of extremities is at baseline in comparison to her admission notation.   Interventions/ Plan   CT of the head ordered/pending Chest x-ray ordered/pending  Placefall arresting pads Maintain bed alarm Bed rails up Fall precautions      Lynwood Kipper BSN MSNA MSN ACNPC-AG Acute Care Nurse Practitioner [facility]

## 2024-07-28 NOTE — Progress Notes (Addendum)
 [facility] Progress Note  CC:  GI bleed  Subjective: No further sign of bleeding.  Apparently she fell out of bed when trying to get up this morning and landed on her stomach according to her family who was at bedside.  Having some abdominal pain.  No nausea or vomiting.  Tolerating full liquid diet, had grits for breakfast.  Nurse states that was that she is very symptomatic from her 4.3 g hemoglobin.  Objective:  Vital signs in last 24 hours: Temp:  [98 F (36.7 C)-99.3 F (37.4 C)] 98.5 F (36.9 C) (09/22 0759) Pulse Rate:  [71-130] 130 (09/22 1000) Resp:  [12-26] 22 (09/22 1000) BP: (88-178)/(40-70) 155/60 (09/22 1000) SpO2:  [100 %] 100 % (09/22 1000) Last BM Date : 07/28/24 General:  Alert, Well-developed, in NAD Heart:  Regular rate and rhythm; no murmurs Pulm:  CTAB.  No W/R/R. Abdomen:  Soft, non-distended.  BS present.  Some mid-abdominal TTP no bruising. Neurologic:  Alert and oriented x 4;  grossly normal neurologically. Psych:  Alert and cooperative. Normal mood and affect.  Intake/Output from previous day: 09/21 0701 - 09/22 0700 In: 2152.6 [P.O.:720; I.V.:1432.6] Out: -  Intake/Output this shift: Total I/O In: 313.2 [I.V.:313.2] Out: 200 [Urine:200]  Lab Results: Recent Labs    07/25/24 1345 07/25/24 2239 07/26/24 1757 07/27/24 0508 07/28/24 0247  WBC 6.3  --   --  10.2  --   HGB 10.9*   < > 5.9* 5.0* 4.3*  HCT 32.9*   < > 19.1* 16.2* 14.5*  PLT 280  --   --  167  --    < > = values in this interval not displayed.   BMET Recent Labs    07/25/24 1345 07/26/24 0454 07/27/24 0508  NA 139 141 141  K 3.8 4.0 3.8  CL 106 111 111  CO2 21* 23 20*  GLUCOSE 143* 101* 101*  BUN 18 19 11   CREATININE 1.34* 1.03* 0.98  CALCIUM  9.9 8.3* 7.9*   LFT Recent Labs    07/25/24 1345  PROT 6.7  ALBUMIN 3.9  AST 19  ALT 23  ALKPHOS 53  BILITOT 0.4   PT/INR Recent Labs    07/25/24 1636 07/27/24 0508  LABPROT 13.9 15.8*  INR  1.0 1.2    CT HEAD WO CONTRAST ( ) Result Date: 07/28/2024 CLINICAL DATA:  68 year old female status post unwitnessed fall. Pain. EXAM: CT HEAD WITHOUT CONTRAST TECHNIQUE: Contiguous axial images were obtained from the base of the skull through the vertex without intravenous contrast. RADIATION DOSE REDUCTION: This exam was performed according to the departmental dose-optimization program which includes automated exposure control, adjustment of the mA and/or kV according to patient size and/or use of iterative reconstruction technique. COMPARISON:  None Available. FINDINGS: Brain: Cerebral volume is within normal limits for age. No midline shift, ventriculomegaly, mass effect, evidence of mass lesion, intracranial hemorrhage or evidence of cortically based acute infarction. Patchy basal ganglia vascular calcifications more apparent on the right. Patchy bilateral cerebral white matter hypodensity, most pronounced adjacent to the left frontal horn (coronal image 25). And similar asymmetric hypodensity in the right thalamus which is circumscribed on coronal image 41 and appears to be a small chronic lacunar infarct. Vascular: Calcified atherosclerosis at the skull base. No suspicious intracranial vascular hyperdensity. Skull: Intact.  No acute osseous abnormality identified. Sinuses/Orbits: Visualized paranasal sinuses and mastoids are well aerated. Other: Left face premalar space partially visible soft tissue swelling and stranding compatible with patchy  hematoma/contusion. This is inferolateral to the left orbit overlying the left zygoma which appears to remain intact. Other orbit and scalp soft tissues appear within normal limits. IMPRESSION: 1. Partially visible left face soft tissue hematoma/contusion. Underlying left zygoma intact. 2. No acute intracranial abnormality. Mild to moderate for age chronic small vessel disease, including in the right thalamus. Electronically Signed   By: VEAR Hurst M.D.   On:  07/28/2024 08:27   DG CHEST PORT 1 VIEW Result Date: 07/28/2024 CLINICAL DATA:  68 year old female status post unwitnessed fall. Pain. EXAM: PORTABLE CHEST 1 VIEW COMPARISON:  CTA abdomen 07/26/2024.  Portable chest 03/05/2024. FINDINGS: Portable AP semi upright view at 0744 hours. Borderline to mild cardiomegaly. Somewhat low lung volumes. Negative lung bases on recent CTA. Mediastinal contours stable since April. Calcified aortic atherosclerosis. Visualized tracheal air column is within normal limits. Allowing for portable technique the lungs are clear. No pneumothorax or pleural effusion. Stable cholecystectomy clips. Negative visible bowel gas. Chronic bilateral shoulder degeneration. No acute osseous abnormality identified. IMPRESSION: No acute cardiopulmonary abnormality or acute traumatic injury identified. Electronically Signed   By: VEAR Hurst M.D.   On: 07/28/2024 07:59   CT ANGIO GI BLEED Result Date: 07/26/2024 CLINICAL DATA:  GI bleed. EXAM: CTA ABDOMEN AND PELVIS WITHOUT AND WITH CONTRAST TECHNIQUE: Multidetector CT imaging of the abdomen and pelvis was performed using the standard protocol during bolus administration of intravenous contrast. Multiplanar reconstructed images and MIPs were obtained and reviewed to evaluate the vascular anatomy. RADIATION DOSE REDUCTION: This exam was performed according to the departmental dose-optimization program which includes automated exposure control, adjustment of the mA and/or kV according to patient size and/or use of iterative reconstruction technique. CONTRAST:  OMNIPAQUE  IOHEXOL  350 MG/ML SOLN COMPARISON:  12/20/2023. FINDINGS: VASCULAR Aorta: Normal caliber aorta without aneurysm, dissection, vasculitis or significant stenosis. Aortic atherosclerosis. Celiac: Patent without evidence of aneurysm, dissection, vasculitis or significant stenosis. SMA: Patent without evidence of aneurysm, dissection, vasculitis or significant stenosis. Renals: Both  renal arteries are patent without evidence of aneurysm, dissection, vasculitis, fibromuscular dysplasia or significant stenosis. Accessory renal artery is noted on the left. IMA: Patent. There is hypervascularity in the region of the rectum with questionable intraluminal hyperdense material in the rectum on delayed phase, series 11 axial image 81. Inflow: Patent without evidence of aneurysm, dissection, vasculitis or significant stenosis. Proximal Outflow: Bilateral common femoral and visualized portions of the superficial and profunda femoral arteries are patent without evidence of aneurysm, dissection, vasculitis or significant stenosis. Veins: No obvious venous abnormality. Review of the MIP images confirms the above findings. NON-VASCULAR Lower chest: Coronary artery calcifications are present. The lung bases are clear. Hepatobiliary: No focal liver abnormality is seen. No gallstones, gallbladder wall thickening, or biliary dilatation. Pancreas: Unremarkable. No pancreatic ductal dilatation or surrounding inflammatory changes. Spleen: Normal in size without focal abnormality. Adrenals/Urinary Tract: There stable thickening in the left adrenal gland without discrete nodule. The right adrenal gland is within normal limits. The kidneys enhance symmetrically. No renal calculus or obstructive uropathy bilaterally. The bladder is unremarkable. Stomach/Bowel: There is a small hiatal hernia. The stomach is otherwise within normal limits. No bowel obstruction, free air, or pneumatosis is seen. Appendix appears normal. There are scattered diverticula along the colon without evidence of diverticulitis. Lymphatic: No abdominal or pelvic lymphadenopathy. Reproductive: Status post hysterectomy. No adnexal masses. Other: No abdominopelvic ascites. There is a large complex fat containing periumbilical hernia in the midline. Musculoskeletal: Degenerative changes are present in the thoracolumbar spine.  There sclerosis at the  sacroiliac joints bilaterally, compatible with sacroiliitis. No acute osseous abnormality is seen. IMPRESSION: VASCULAR 1. Hypervascularity in the region of the rectum with questionable trace intraluminal contrast on delayed image, possible trace active bleeding. 2. Aortic atherosclerosis. 3. Coronary artery calcifications. NON-VASCULAR 1. Diverticulosis without diverticulitis. 2. Small hiatal hernia. 3. Large complex periumbilical hernia. Electronically Signed   By: Leita Birmingham M.D.   On: 07/26/2024 14:40   Assessment / Plan: Ms. Nordell is a 68 y.o. female with a pmh significant for diabetes, hypertension, hyperlipidemia, prior TIA, MDD, esophageal stricture, diverticulosis.  The GI service is consulted for evaluation and management of acute blood loss anemia in the setting of rectal bleeding.    The patient is hemodynamically and clinically stable today.  Her hemogram has downtrended to 5.0 today.  She has not had any further GI bleeding for nearly 12 hours.  However CT scan suggested some hypervascularity in the region of the rectum with the possibility of active bleeding thus the consideration of performing a flexible sigmoidoscopy has come up.  The risks and benefits of endoscopic evaluation were discussed with the patient; these include but are not limited to the risk of perforation, infection, bleeding, missed lesions, lack of diagnosis, severe illness requiring hospitalization, as well as anesthesia and sedation related illnesses.  After extensive discussion with the patient, she is willing to move forward with flexible sigmoidoscopy to evaluate that area.  We still believe that diverticular hemorrhage is most likely but since the area should be within reach it is worth considering sigmoidoscopy.  We also offered continued monitoring.  Unfortunately, as a result of her underlying religious beliefs, she has less ability to be supported since she does not receive blood transfusions.   Flex sig  9/21: - Preparation of the colon was inadequate ( this was due to this being a FS with only tap water  enema) . - Hemorrhoids found on digital rectal exam. - Stool in the transverse colon ( more purple/ dark brown) semi- formed. - Blood and clots in the entire examined left colon. - Diverticulosis in the entire examined colon. - No evidence of active extravasation has been noted upon our examination today. - Non- bleeding non- thrombosed internal hemorrhoids. - Presumption is that this has been a diverticular hemorrhage.  Hgb down to 4.3 grams this AM, but no further sign of bleeding.  -Continue to monitor Hgb. -If bleeding recurs briskly then CTA to try to localize bleeding.   LOS: 3 days   Harlene BIRCH. Zehr  07/28/2024, 12:38 PM     Attending physician's note   I have taken history, reviewed the chart and examined the patient. I performed a substantive portion of this encounter, including complete performance of at least one of the key components, in conjunction with the APP. I agree with the Advanced Practitioner's note, impression and recommendations.   Diverticular bleed (resolved). Hb down to 4.3 (baseline 13). Neg CTA/FS. Prev colon 04/2020 w/t pancolonic diverticulosis. Symptomatic anemia Jehovah's Witness- No blood/blood products Also is allergic to IV iron  but cannot tolerate PO  Plan: - Advance diet - PO iron  - Minimize blood draws. - Agree with EPO - No plans for any further GI intervention. - If any active bleeding, repeat CTA - Will sign off for now. Pl call with any ?/Change in clinical status. - D/W pt and family in detail.   Anselm Bring, MD [facility] [phone]

## 2024-07-28 NOTE — Progress Notes (Signed)
 Patient found in room lying on her stomach, covered in urine. Pt stated I was trying to use the bathroom. When patient was asked whether she called for help, patient replied no. RN and NT cleaned pt up and assisted her back to bed and reset bed alarm.

## 2024-07-28 NOTE — Progress Notes (Signed)
   07/28/24 1204  TOC Brief Assessment  Insurance and Status Reviewed  Patient has primary care physician Yes  Home environment has been reviewed Apartment  Prior level of function: Independent with ADL's  Prior/Current Home Services No current home services  Social Drivers of Health Review SDOH reviewed no interventions necessary  Readmission risk has been reviewed Yes  Transition of care needs transition of care needs identified, TOC will continue to follow

## 2024-07-28 NOTE — Plan of Care (Signed)

## 2024-07-28 NOTE — Progress Notes (Signed)
 PROGRESS NOTE  NIMCO BIVENS FMW:[id number] DOB: 29-Mar-1956 DOA: 07/25/2024 PCP: Adella Norris, MD   LOS: 3 days   Brief narrative:  Haley [name] is a 68 y.o. female with medical history significant for essential hypertension, GERD, reflux esophagitis, esophageal stenosis post dilatation on 07/03/2024, obesity on Mounjaro , extensive diverticular disease, who presents to the ED with complaints of rectal bleeding for 1 day with multiple episodes.  Last colonoscopy 3 years ago when she had hemorrhoids.  Recently was on PPI for reflux esophagitis but denied any NSAID usage.  Patient also had exertional chest tightness and mild dyspnea while walking.   In the ED, patient was afebrile.  Blood pressure was marginally low.  Troponin was less than 15.  Creatinine 1.3 from baseline 1.0.    Initial hemoglobin was 10.9.   Repeat hemoglobin 6.7 from 10.9.  CT scan of the abdomen with diverticulosis.  The patient had another episode of bloody stool and was transferred to stepdown unit for closer monitoring.   She has declined blood transfusion due to religious reasons as a Jehovah witness.   Assessment/Plan: Principal Problem:   GI bleed Active Problems:   Hematochezia   Anemia  GI bleed Patient had EGD done on 07/03/2024.  Does not wish PRBC transfusion due to religious reasons.  Latest hemoglobin of 4.3. GI on board and underwent flex sigmoidoscopy today with findings of clotted blood and old maroonish black stool in the entire left colon without any acute extravasation but multiple diverticuli noted about the entire colon.  Nonbleeding nonthrombosed internal hemorrhoids were also found.  Thought to be diverticular bleed at this time.  Tried IV iron  twice but could not take it due to to hypersensitivity reaction.  Received 1 dose of Epogen  yesterday.  Will need to start on oral iron  soon.  Has not had further bowel movement since yesterday.  If you have bright red bleeding per rectum will need CT  angiogram again and possible IR intervention.  Hopefully if his diverticular should settle most of the time.  Will continue to watch closely.  Discussed about potential ongoing deterioration.  Acute blood loss anemia in the setting of GI bleed Hemoglobin at baseline 13.2 on 03/05/2024, latest hemoglobin of 4.3.  Continue to monitor vitals and hemoglobin.  Try to conserve blood if possible.  Received 1 dose of Epogen  yesterday.  Hypertension On lisinopril  at home.  Currently on hold.  Takes metoprolol  as needed for palpitation.  With syncope will hold off for now.   Mild AKI, prerenal Volume loss from GI bleed. Baseline creatinine 1.0, creatinine today at 0.9.   Syncope likely secondary to hypovolemia. Had syncope again yesterday.  Denies any chest pain at this time.   Intermittent chest pain. Troponin less than 15.  No ischemia on twelve-lead EKG. have intermittent chest pain likely secondary to angina from severe anemia.  Response with morphine  and nitro.  History of stroke, Continue to hold aspirin .  History of sleep apnea.  Not on CPAP at home.   Obesity On Mounjaro , once weekly, last use was on Wednesday.  DVT prophylaxis: SCDs Start: 07/25/24 2217   Disposition: Home likely 1 to 2 days  Status is: Inpatient Remains inpatient appropriate because: Severe anemia, GI bleed, closer monitoring  Code Status:     Code Status: Full Code  Family Communication: With the patient's daughter at bedside on 07/28/2024  Consultants: GI  Procedures: flexible sigmoidoscopy 9/20//2025  Anti-infectives:  None  Anti-infectives (From admission, onward)  None      Subjective: Today, patient was seen and examined at bedside.  States that she has not had any bowel movement since the procedure yesterday.  Had a fall and syncope when she tried to go to the bathroom seen after flexible sigmoidoscopy.  Patient denies any dyspnea, chest pain, shortness of breath at the time of my  interview..  Objective: Vitals:   07/28/24 0900 07/28/24 1000  BP: (!) 139/56 (!) 155/60  Pulse: 86 (!) 130  Resp: 19 (!) 22  Temp:    SpO2: 100% 100%    Intake/Output Summary (Last 24 hours) at 07/28/2024 1118 Last data filed at 07/28/2024 1000 Gross per 24 hour  Intake 2315.9 ml  Output --  Net 2315.9 ml   Filed Weights   07/26/24 0153 07/27/24 1051  Weight: 75.9 kg 75.9 kg   Body mass index is 31.62 kg/m.   Physical Exam:  General: Obese built, not in obvious distress HENT: Pallor noted oral mucosa is moist.  Chest:  Clear breath sounds.   No crackles or wheezes.  CVS: S1 &S2 heard. No murmur.  Regular rate and rhythm. Abdomen: Soft, nontender, nondistended.  Bowel sounds are heard.   Extremities: No cyanosis, clubbing or edema.  Peripheral pulses are palpable. Psych: Alert, awake and oriented, normal mood CNS:  No cranial nerve deficits.  Power equal in all extremities.   Skin: Warm and dry.  No rashes noted.   Data Review: I have personally reviewed the following laboratory data and studies,  CBC: Recent Labs  Lab 07/25/24 1345 07/25/24 2239 07/26/24 0454 07/26/24 0957 07/26/24 1757 07/27/24 0508 07/28/24 0247  WBC 6.3  --   --   --   --  10.2  --   HGB 10.9*   < > 6.7* 6.8* 5.9* 5.0* 4.3*  HCT 32.9*   < > 21.4* 21.4* 19.1* 16.2* 14.5*  MCV 94.5  --   --   --   --  101.3*  --   PLT 280  --   --   --   --  167  --    < > = values in this interval not displayed.   Basic Metabolic Panel: Recent Labs  Lab 07/25/24 1345 07/26/24 0454 07/27/24 0508  NA 139 141 141  K 3.8 4.0 3.8  CL 106 111 111  CO2 21* 23 20*  GLUCOSE 143* 101* 101*  BUN 18 19 11   CREATININE 1.34* 1.03* 0.98  CALCIUM  9.9 8.3* 7.9*  MG  --   --  1.8  PHOS  --   --  2.7   Liver Function Tests: Recent Labs  Lab 07/25/24 1345  AST 19  ALT 23  ALKPHOS 53  BILITOT 0.4  PROT 6.7  ALBUMIN 3.9   No results for input(s): LIPASE, AMYLASE in the last 168 hours. No results  for input(s): AMMONIA in the last 168 hours. Cardiac Enzymes: No results for input(s): CKTOTAL, CKMB, CKMBINDEX, TROPONINI in the last 168 hours. BNP (last 3 results) No results for input(s): BNP in the last 8760 hours.  ProBNP (last 3 results) No results for input(s): PROBNP in the last 8760 hours.  CBG: Recent Labs  Lab 07/25/24 2215 07/26/24 1232  GLUCAP 104* 115*   Recent Results (from the past 240 hours)  MRSA Next Gen by PCR, Nasal     Status: None   Collection Time: 07/26/24  9:33 AM   Specimen: Nasal Mucosa; Nasal Swab  Result Value Ref Range Status  MRSA by PCR Next Gen NOT DETECTED NOT DETECTED Final    Comment: (NOTE) The GeneXpert MRSA Assay (FDA approved for NASAL specimens only), is one component of a comprehensive MRSA colonization surveillance program. It is not intended to diagnose MRSA infection nor to guide or monitor treatment for MRSA infections. Test performance is not FDA approved in patients less than 22 years old. Performed at [facility], [address] [place], [place] [address]      Studies: CT HEAD WO CONTRAST ( ) Result Date: 07/28/2024 CLINICAL DATA:  68 year old female status post unwitnessed fall. Pain. EXAM: CT HEAD WITHOUT CONTRAST TECHNIQUE: Contiguous axial images were obtained from the base of the skull through the vertex without intravenous contrast. RADIATION DOSE REDUCTION: This exam was performed according to the departmental dose-optimization program which includes automated exposure control, adjustment of the mA and/or kV according to patient size and/or use of iterative reconstruction technique. COMPARISON:  None Available. FINDINGS: Brain: Cerebral volume is within normal limits for age. No midline shift, ventriculomegaly, mass effect, evidence of mass lesion, intracranial hemorrhage or evidence of cortically based acute infarction. Patchy basal ganglia vascular calcifications more apparent on the  right. Patchy bilateral cerebral white matter hypodensity, most pronounced adjacent to the left frontal horn (coronal image 25). And similar asymmetric hypodensity in the right thalamus which is circumscribed on coronal image 41 and appears to be a small chronic lacunar infarct. Vascular: Calcified atherosclerosis at the skull base. No suspicious intracranial vascular hyperdensity. Skull: Intact.  No acute osseous abnormality identified. Sinuses/Orbits: Visualized paranasal sinuses and mastoids are well aerated. Other: Left face premalar space partially visible soft tissue swelling and stranding compatible with patchy hematoma/contusion. This is inferolateral to the left orbit overlying the left zygoma which appears to remain intact. Other orbit and scalp soft tissues appear within normal limits. IMPRESSION: 1. Partially visible left face soft tissue hematoma/contusion. Underlying left zygoma intact. 2. No acute intracranial abnormality. Mild to moderate for age chronic small vessel disease, including in the right thalamus. Electronically Signed   By: VEAR Hurst M.D.   On: 07/28/2024 08:27   DG CHEST PORT 1 VIEW Result Date: 07/28/2024 CLINICAL DATA:  68 year old female status post unwitnessed fall. Pain. EXAM: PORTABLE CHEST 1 VIEW COMPARISON:  CTA abdomen 07/26/2024.  Portable chest 03/05/2024. FINDINGS: Portable AP semi upright view at 0744 hours. Borderline to mild cardiomegaly. Somewhat low lung volumes. Negative lung bases on recent CTA. Mediastinal contours stable since April. Calcified aortic atherosclerosis. Visualized tracheal air column is within normal limits. Allowing for portable technique the lungs are clear. No pneumothorax or pleural effusion. Stable cholecystectomy clips. Negative visible bowel gas. Chronic bilateral shoulder degeneration. No acute osseous abnormality identified. IMPRESSION: No acute cardiopulmonary abnormality or acute traumatic injury identified. Electronically Signed   By: VEAR Hurst M.D.   On: 07/28/2024 07:59   CT ANGIO GI BLEED Result Date: 07/26/2024 CLINICAL DATA:  GI bleed. EXAM: CTA ABDOMEN AND PELVIS WITHOUT AND WITH CONTRAST TECHNIQUE: Multidetector CT imaging of the abdomen and pelvis was performed using the standard protocol during bolus administration of intravenous contrast. Multiplanar reconstructed images and MIPs were obtained and reviewed to evaluate the vascular anatomy. RADIATION DOSE REDUCTION: This exam was performed according to the departmental dose-optimization program which includes automated exposure control, adjustment of the mA and/or kV according to patient size and/or use of iterative reconstruction technique. CONTRAST:  OMNIPAQUE  IOHEXOL  350 MG/ML SOLN COMPARISON:  12/20/2023. FINDINGS: VASCULAR Aorta: Normal caliber aorta without aneurysm, dissection,  vasculitis or significant stenosis. Aortic atherosclerosis. Celiac: Patent without evidence of aneurysm, dissection, vasculitis or significant stenosis. SMA: Patent without evidence of aneurysm, dissection, vasculitis or significant stenosis. Renals: Both renal arteries are patent without evidence of aneurysm, dissection, vasculitis, fibromuscular dysplasia or significant stenosis. Accessory renal artery is noted on the left. IMA: Patent. There is hypervascularity in the region of the rectum with questionable intraluminal hyperdense material in the rectum on delayed phase, series 11 axial image 81. Inflow: Patent without evidence of aneurysm, dissection, vasculitis or significant stenosis. Proximal Outflow: Bilateral common femoral and visualized portions of the superficial and profunda femoral arteries are patent without evidence of aneurysm, dissection, vasculitis or significant stenosis. Veins: No obvious venous abnormality. Review of the MIP images confirms the above findings. NON-VASCULAR Lower chest: Coronary artery calcifications are present. The lung bases are clear. Hepatobiliary: No focal  liver abnormality is seen. No gallstones, gallbladder wall thickening, or biliary dilatation. Pancreas: Unremarkable. No pancreatic ductal dilatation or surrounding inflammatory changes. Spleen: Normal in size without focal abnormality. Adrenals/Urinary Tract: There stable thickening in the left adrenal gland without discrete nodule. The right adrenal gland is within normal limits. The kidneys enhance symmetrically. No renal calculus or obstructive uropathy bilaterally. The bladder is unremarkable. Stomach/Bowel: There is a small hiatal hernia. The stomach is otherwise within normal limits. No bowel obstruction, free air, or pneumatosis is seen. Appendix appears normal. There are scattered diverticula along the colon without evidence of diverticulitis. Lymphatic: No abdominal or pelvic lymphadenopathy. Reproductive: Status post hysterectomy. No adnexal masses. Other: No abdominopelvic ascites. There is a large complex fat containing periumbilical hernia in the midline. Musculoskeletal: Degenerative changes are present in the thoracolumbar spine. There sclerosis at the sacroiliac joints bilaterally, compatible with sacroiliitis. No acute osseous abnormality is seen. IMPRESSION: VASCULAR 1. Hypervascularity in the region of the rectum with questionable trace intraluminal contrast on delayed image, possible trace active bleeding. 2. Aortic atherosclerosis. 3. Coronary artery calcifications. NON-VASCULAR 1. Diverticulosis without diverticulitis. 2. Small hiatal hernia. 3. Large complex periumbilical hernia. Electronically Signed   By: Leita Birmingham M.D.   On: 07/26/2024 14:40      Vernal Alstrom, MD  [facility] 07/28/2024  If 7PM-7AM, please contact night-coverage

## 2024-07-29 DIAGNOSIS — K921 Melena: Secondary | ICD-10-CM | POA: Diagnosis not present

## 2024-07-29 DIAGNOSIS — D649 Anemia, unspecified: Secondary | ICD-10-CM | POA: Diagnosis not present

## 2024-07-29 LAB — HEMOGLOBIN AND HEMATOCRIT, BLOOD
HCT: 14.1 % — ABNORMAL LOW (ref 36.0–46.0)
Hemoglobin: 4.3 g/dL — CL (ref 12.0–15.0)

## 2024-07-29 MED ORDER — LISINOPRIL 10 MG PO TABS
20.0000 mg | ORAL_TABLET | Freq: Every day | ORAL | Status: DC
  Administered 2024-07-29 – 2024-08-01 (×4): 20 mg via ORAL
  Filled 2024-07-29 (×4): qty 2

## 2024-07-29 MED ORDER — STERILE WATER FOR INJECTION IJ SOLN
INTRAMUSCULAR | Status: AC
  Administered 2024-07-29: 10 mL
  Filled 2024-07-29: qty 10

## 2024-07-29 NOTE — Evaluation (Signed)
 Physical Therapy Evaluation Patient Details Name: Haley [name] MRN: [id number] DOB: 1956/06/13 Today's Date: 07/29/2024  History of Present Illness  68 yo female admitted with GI bleed, ABLA, syncope, fall 9/22. Hx of esophageal stenosis, CVA, OSA, DM, CAD, chronic pain, shingles  Clinical Impression  On eval in ICU, pt required Supv-Min A for safe mobility. She was able to stand x 2 before stepping over to recliner. Poor tolerance for standing at this time-unable to remain standing entire time when assessing BP orthostatics 2* dizziness-see flowsheet section for readings. She is at risk for falls when mobilizing. Pt is motivated to begin mobilizing and hopeful to be able to return home. Pt lives alone. May need to consider ST SNF--this depends on medical course. Will continue to follow and progress activity as safely able. Should pt choose to return home at discharge, would recommend 24/7 supervision/assist at this time.         If plan is discharge home, recommend the following: A little help with walking and/or transfers;A little help with bathing/dressing/bathroom;Assistance with cooking/housework;Assist for transportation;Help with stairs or ramp for entrance   Can travel by private vehicle        Equipment Recommendations  (continuing to assess)  Recommendations for Other Services       Functional Status Assessment Patient has had a recent decline in their functional status and demonstrates the ability to make significant improvements in function in a reasonable and predictable amount of time.     Precautions / Restrictions Precautions Precautions: Fall Precaution/Restrictions Comments: monitor BP, symptoms when mobilizing Restrictions Weight Bearing Restrictions Per Provider Order: No      Mobility  Bed Mobility Overal bed mobility: Needs Assistance Bed Mobility: Supine to Sit     Supine to sit: Supervision, HOB elevated     General bed mobility comments: Supv for  safety, lines.    Transfers Overall transfer level: Needs assistance   Transfers: Sit to/from Stand, Bed to chair/wheelchair/BSC Sit to Stand: Contact guard assist   Step pivot transfers: Min assist       General transfer comment: CGA for safety. Pt reports dizziness with standing. Min A to steady while pivoting to recliner    Ambulation/Gait               General Gait Details: Deferred at this time 2* safety risk  Stairs            Wheelchair Mobility     Tilt Bed    Modified Rankin (Stroke Patients Only)       Balance Overall balance assessment: Needs assistance, History of Falls         Standing balance support: During functional activity Standing balance-Leahy Scale: Fair                               Pertinent Vitals/Pain Pain Assessment Pain Assessment: No/denies pain    Home Living Family/patient expects to be discharged to:: Private residence Living Arrangements: Alone Available Help at Discharge: Family;Available PRN/intermittently Type of Home: Apartment Home Access: Level entry       Home Layout: One level Home Equipment: None      Prior Function Prior Level of Function : Independent/Modified Independent;History of Falls (last six months)               ADLs Comments: still drives     Extremity/Trunk Assessment   Upper Extremity Assessment Upper Extremity Assessment: Defer to OT  evaluation    Lower Extremity Assessment Lower Extremity Assessment: Generalized weakness    Cervical / Trunk Assessment Cervical / Trunk Assessment: Normal  Communication   Communication Communication: No apparent difficulties    Cognition Arousal: Alert Behavior During Therapy: WFL for tasks assessed/performed   PT - Cognitive impairments: No apparent impairments                         Following commands: Intact       Cueing Cueing Techniques: Verbal cues     General Comments      Exercises      Assessment/Plan    PT Assessment Patient needs continued PT services  PT Problem List Decreased strength;Decreased range of motion;Decreased activity tolerance;Decreased balance;Decreased mobility;Decreased knowledge of use of DME       PT Treatment Interventions DME instruction;Gait training;Functional mobility training;Therapeutic activities;Therapeutic exercise;Patient/family education;Balance training    PT Goals (Current goals can be found in the Care Plan section)  Acute Rehab PT Goals Patient Stated Goal: regain plof/independence; home PT Goal Formulation: With patient Time For Goal Achievement: 08/12/24 Potential to Achieve Goals: Good    Frequency Min 3X/week     Co-evaluation               AM-PAC PT 6 Clicks Mobility  Outcome Measure Help needed turning from your back to your side while in a flat bed without using bedrails?: A Little Help needed moving from lying on your back to sitting on the side of a flat bed without using bedrails?: A Little Help needed moving to and from a bed to a chair (including a wheelchair)?: A Little Help needed standing up from a chair using your arms (e.g., wheelchair or bedside chair)?: A Little Help needed to walk in hospital room?: A Lot Help needed climbing 3-5 steps with a railing? : Total 6 Click Score: 15    End of Session   Activity Tolerance:  (limited by dizziness) Patient left: in chair;with call bell/phone within reach;with family/visitor present;with nursing/sitter in room   PT Visit Diagnosis: Muscle weakness (generalized) (M62.81);Difficulty in walking, not elsewhere classified (R26.2);History of falling (Z91.81);Unsteadiness on feet (R26.81);Dizziness and giddiness (R42)    Time: 8547-8484 PT Time Calculation (min) (ACUTE ONLY): 23 min   Charges:   PT Evaluation $PT Eval Low Complexity: 1 Low PT Treatments $Therapeutic Activity: 8-22 mins PT General Charges $$ ACUTE PT VISIT: 1 Visit             Dannial SQUIBB, PT Acute Rehabilitation  Office: [phone]

## 2024-07-29 NOTE — Progress Notes (Addendum)
 1800 patient did well today now off 02 worked with therapy dizzy with standing for the first time but after that seems to be less and go away faster. Patient tolerated activity with NO chest pain today for 7a-7p. No signs of any bleeding noted today no bowel movement for days shift

## 2024-07-29 NOTE — Plan of Care (Signed)
  Problem: Education: Goal: Knowledge of General Education information will improve Description: Including pain rating scale, medication(s)/side effects and non-pharmacologic comfort measures Outcome: Progressing   Problem: Health Behavior/Discharge Planning: Goal: Ability to manage health-related needs will improve Outcome: Progressing   Problem: Clinical Measurements: Goal: Will remain free from infection Outcome: Progressing Goal: Diagnostic test results will improve Outcome: Progressing - Hgb stable from yesterday Goal: Respiratory complications will improve Outcome: Progressing Goal: Cardiovascular complication will be avoided Outcome: Progressing   Problem: Nutrition: Goal: Adequate nutrition will be maintained Outcome: Progressing   Problem: Coping: Goal: Level of anxiety will decrease Outcome: Progressing   Problem: Elimination: Goal: Will not experience complications related to bowel motility Outcome: Progressing Goal: Will not experience complications related to urinary retention Outcome: Progressing   Problem: Pain Managment: Goal: General experience of comfort will improve and/or be controlled Outcome: Progressing   Problem: Safety: Goal: Ability to remain free from injury will improve Outcome: Progressing   Problem: Skin Integrity: Goal: Risk for impaired skin integrity will decrease Outcome: Progressing   Cindy S. Loreli BSN, RN, [employer], CCRN 07/29/2024 7:32 AM

## 2024-07-29 NOTE — Progress Notes (Addendum)
 PROGRESS NOTE  Haley [name] FMW:[id number] DOB: [birth year] DOA: 07/25/2024 PCP: Adella Norris, MD   LOS: 4 days   Brief narrative:  Haley [name] is a 68 y.o. female with medical history significant for essential hypertension, GERD, reflux esophagitis, esophageal stenosis post dilatation on 07/03/2024, obesity on Mounjaro , extensive diverticular disease, who presents to the ED with complaints of rectal bleeding for 1 day with multiple episodes.  Last colonoscopy 3 years ago when she had hemorrhoids.  Recently was on PPI for reflux esophagitis but denied any NSAID usage.  Patient also had exertional chest tightness and mild dyspnea while walking.   In the ED, patient was afebrile.  Blood pressure was marginally low.  Troponin was less than 15.  Creatinine 1.3 from baseline 1.0.    Initial hemoglobin was 10.9.   Repeat hemoglobin 6.7 from 10.9.  CT scan of the abdomen with diverticulosis.  The patient had another episode of bloody stool and was transferred to stepdown unit for closer monitoring.   She has declined blood transfusion due to religious reasons as a Jehovah witness.  During hospitalization, patient had a CT bleeding study followed by sigmoidoscopy due to ongoing bleeding.  Thought to be diverticular disease.  At this time has not had bowel movement in 2 days and hemoglobin has started to plateau at 4.3.  PT has recommended skilled nursing facility today.   Assessment/Plan: Principal Problem:   GI bleed Active Problems:   Hematochezia   Anemia  GI bleed likely lower GI from diverticular bleed. Patient had EGD done on 07/03/2024.  Latest hemoglobin of 4.3. GI on board and underwent flex sigmoidoscopy on 07/27/2024 with findings of clotted blood and old maroonish black stool in the entire left colon without any acute extravasation but multiple diverticuli noted about the entire colon.  Nonbleeding nonthrombosed internal hemorrhoids were also found.  Thought to be diverticular  bleed at this time.  Tried IV iron  twice but could not take it due to to hypersensitivity reaction.  Received 1 dose of Epogen  during hospitalization.  Patient has been started on oral iron ..  Has not had further bowel movement for 2 days.    If bright red bleeding per rectum, will need CT angiogram again and possible IR intervention.   Will continue to watch closely.  Discussed about potential deterioration with the patient at bedside.  Acute blood loss anemia in the setting of GI bleed Hemoglobin at baseline 13.2 on 03/05/2024, latest hemoglobin of 4.3.  Continue to monitor vitals and hemoglobin.  Try to conserve blood if possible.  Received 1 dose of Epogen .  I have ordered H&H only daily for the morning.  Continue iron  and vitamin B12.  Hypertension On lisinopril  at home.  Takes metoprolol  as needed for palpitation.  Will restart lisinopril  at half dose starting today.   Mild AKI, prerenal Improved.  Latest creatinine of 0.9   Syncope likely secondary to hypovolemia and blood loss.. No further episodes will get PT evaluation   Intermittent chest pain. Troponin less than 15.  No ischemia on twelve-lead EKG. have intermittent chest pain likely secondary to angina from severe anemia.  Response with morphine  and nitro.  No chest pain today.  History of stroke, Continue to hold aspirin .  History of sleep apnea.  Not on CPAP at home.   Class I obesity Body mass index is 31.62 kg/m.  On Mounjaro , once weekly, last use was on Wednesday.  DVT prophylaxis: SCDs Start: 07/25/24 2217   Disposition: Physical therapy  has seen the patient today and recommended skilled nursing facility placement.  Will consult TOC.  Status is: Inpatient Remains inpatient appropriate because: Severe anemia, GI bleed, closer monitoring the need for rehabitation.  Code Status:     Code Status: Full Code  Family Communication: I again spoke with with the patient's daughter at bedside on  07/29/2024  Consultants: GI  Procedures: flexible sigmoidoscopy 9/20//2025  Anti-infectives:  None  Anti-infectives (From admission, onward)    None      Subjective: Today, patient was seen and examined at bedside.  Patient complains of mild dizziness but no chest pain or dyspnea.  Has not had a bowel movement since the sigmoidoscopy.  Objective: Vitals:   07/29/24 1000 07/29/24 1200  BP: (!) 150/67 (!) 138/55  Pulse: 87 79  Resp: (!) 22 20  Temp:  98.4 F (36.9 C)  SpO2: 100% 100%    Intake/Output Summary (Last 24 hours) at 07/29/2024 1305 Last data filed at 07/29/2024 1000 Gross per 24 hour  Intake 485.05 ml  Output 2850 ml  Net -2364.95 ml   Filed Weights   07/26/24 0153 07/27/24 1051  Weight: 75.9 kg 75.9 kg   Body mass index is 31.62 kg/m.   Physical Exam:  General: Obese built, not in obvious distress HENT: Pallor noted, oral mucosa moist. Chest:  Clear breath sounds.   No crackles or wheezes.  CVS: S1 &S2 heard. No murmur.  Regular rate and rhythm. Abdomen: Soft, nontender, nondistended.  Bowel sounds are heard.   Extremities: No cyanosis, clubbing or edema.  Peripheral pulses are palpable. Psych: Alert, awake and oriented, normal mood CNS:  No cranial nerve deficits.  Generalized weakness noted. Skin: Warm and dry.  No rashes noted.   Data Review: I have personally reviewed the following laboratory data and studies,  CBC: Recent Labs  Lab 07/25/24 1345 07/25/24 2239 07/26/24 0957 07/26/24 1757 07/27/24 0508 07/28/24 0247 07/29/24 0336  WBC 6.3  --   --   --  10.2  --   --   HGB 10.9*   < > 6.8* 5.9* 5.0* 4.3* 4.3*  HCT 32.9*   < > 21.4* 19.1* 16.2* 14.5* 14.1*  MCV 94.5  --   --   --  101.3*  --   --   PLT 280  --   --   --  167  --   --    < > = values in this interval not displayed.   Basic Metabolic Panel: Recent Labs  Lab 07/25/24 1345 07/26/24 0454 07/27/24 0508  NA 139 141 141  K 3.8 4.0 3.8  CL 106 111 111  CO2 21* 23  20*  GLUCOSE 143* 101* 101*  BUN 18 19 11   CREATININE 1.34* 1.03* 0.98  CALCIUM  9.9 8.3* 7.9*  MG  --   --  1.8  PHOS  --   --  2.7   Liver Function Tests: Recent Labs  Lab 07/25/24 1345  AST 19  ALT 23  ALKPHOS 53  BILITOT 0.4  PROT 6.7  ALBUMIN 3.9   No results for input(s): LIPASE, AMYLASE in the last 168 hours. No results for input(s): AMMONIA in the last 168 hours. Cardiac Enzymes: No results for input(s): CKTOTAL, CKMB, CKMBINDEX, TROPONINI in the last 168 hours. BNP (last 3 results) No results for input(s): BNP in the last 8760 hours.  ProBNP (last 3 results) No results for input(s): PROBNP in the last 8760 hours.  CBG: Recent Labs  Lab 07/25/24  2215 07/26/24 1232  GLUCAP 104* 115*   Recent Results (from the past 240 hours)  MRSA Next Gen by PCR, Nasal     Status: None   Collection Time: 07/26/24  9:33 AM   Specimen: Nasal Mucosa; Nasal Swab  Result Value Ref Range Status   MRSA by PCR Next Gen NOT DETECTED NOT DETECTED Final    Comment: (NOTE) The GeneXpert MRSA Assay (FDA approved for NASAL specimens only), is one component of a comprehensive MRSA colonization surveillance program. It is not intended to diagnose MRSA infection nor to guide or monitor treatment for MRSA infections. Test performance is not FDA approved in patients less than 61 years old. Performed at [facility], [address], [place], [place] [address]      Studies: CT HEAD WO CONTRAST ( ) Result Date: 07/28/2024 CLINICAL DATA:  67 year old female status post unwitnessed fall. Pain. EXAM: CT HEAD WITHOUT CONTRAST TECHNIQUE: Contiguous axial images were obtained from the base of the skull through the vertex without intravenous contrast. RADIATION DOSE REDUCTION: This exam was performed according to the departmental dose-optimization program which includes automated exposure control, adjustment of the mA and/or kV according to patient size and/or  use of iterative reconstruction technique. COMPARISON:  None Available. FINDINGS: Brain: Cerebral volume is within normal limits for age. No midline shift, ventriculomegaly, mass effect, evidence of mass lesion, intracranial hemorrhage or evidence of cortically based acute infarction. Patchy basal ganglia vascular calcifications more apparent on the right. Patchy bilateral cerebral white matter hypodensity, most pronounced adjacent to the left frontal horn (coronal image 25). And similar asymmetric hypodensity in the right thalamus which is circumscribed on coronal image 41 and appears to be a small chronic lacunar infarct. Vascular: Calcified atherosclerosis at the skull base. No suspicious intracranial vascular hyperdensity. Skull: Intact.  No acute osseous abnormality identified. Sinuses/Orbits: Visualized paranasal sinuses and mastoids are well aerated. Other: Left face premalar space partially visible soft tissue swelling and stranding compatible with patchy hematoma/contusion. This is inferolateral to the left orbit overlying the left zygoma which appears to remain intact. Other orbit and scalp soft tissues appear within normal limits. IMPRESSION: 1. Partially visible left face soft tissue hematoma/contusion. Underlying left zygoma intact. 2. No acute intracranial abnormality. Mild to moderate for age chronic small vessel disease, including in the right thalamus. Electronically Signed   By: VEAR Hurst M.D.   On: 07/28/2024 08:27   DG CHEST PORT 1 VIEW Result Date: 07/28/2024 CLINICAL DATA:  68 year old female status post unwitnessed fall. Pain. EXAM: PORTABLE CHEST 1 VIEW COMPARISON:  CTA abdomen 07/26/2024.  Portable chest 03/05/2024. FINDINGS: Portable AP semi upright view at 0744 hours. Borderline to mild cardiomegaly. Somewhat low lung volumes. Negative lung bases on recent CTA. Mediastinal contours stable since April. Calcified aortic atherosclerosis. Visualized tracheal air column is within normal  limits. Allowing for portable technique the lungs are clear. No pneumothorax or pleural effusion. Stable cholecystectomy clips. Negative visible bowel gas. Chronic bilateral shoulder degeneration. No acute osseous abnormality identified. IMPRESSION: No acute cardiopulmonary abnormality or acute traumatic injury identified. Electronically Signed   By: VEAR Hurst M.D.   On: 07/28/2024 07:59      Vernal Alstrom, MD  [facility] 07/29/2024  If 7PM-7AM, please contact night-coverage

## 2024-07-29 NOTE — Plan of Care (Signed)

## 2024-07-30 DIAGNOSIS — K5791 Diverticulosis of intestine, part unspecified, without perforation or abscess with bleeding: Secondary | ICD-10-CM | POA: Diagnosis not present

## 2024-07-30 LAB — HEMOGLOBIN AND HEMATOCRIT, BLOOD
HCT: 15 % — ABNORMAL LOW (ref 36.0–46.0)
Hemoglobin: 4.6 g/dL — CL (ref 12.0–15.0)

## 2024-07-30 MED ORDER — PANTOPRAZOLE SODIUM 40 MG PO TBEC
40.0000 mg | DELAYED_RELEASE_TABLET | Freq: Two times a day (BID) | ORAL | Status: DC
Start: 2024-07-30 — End: 2024-08-01
  Administered 2024-07-30 – 2024-08-01 (×5): 40 mg via ORAL
  Filled 2024-07-30 (×5): qty 1

## 2024-07-30 NOTE — Evaluation (Signed)
 Occupational Therapy Evaluation Patient Details Name: Haley [name] MRN: [id number] DOB: May 14, 1956 Today's Date: 07/30/2024   History of Present Illness   Ms. Knudtson is a 68 yr old female admitted with rectal bleeding and was found to have GI bleed, acute blood loss anemia, and AKI. PMH: esophageal stenosis, HTN, CVA, OSA, DM, CAD, back pain, shingles     Clinical Impressions The pt is currently presenting below her baseline level of functioning for self-care management. She is limited by the below listed deficits (see OT problem list). During the session, she required SBA to CGA for assessed tasks, including lower body dressing, sit to stand, toileting at bathroom level, and for hand washing standing at the sink. She denied having dizziness and lightheadedness with activity, though she reported feelings of mild weakness and slight fatigue. She will benefit from further OT services to maximize her independence with self-care tasks and to decrease the restricted participation in meaningful activities. Anticipate no post-acute care therapy services, though OT recommends the pt have intermittent supervision & assist from family as needed, upon her return home.      If plan is discharge home, recommend the following:   Assist for transportation;Assistance with cooking/housework     Functional Status Assessment   Patient has had a recent decline in their functional status and demonstrates the ability to make significant improvements in function in a reasonable and predictable amount of time.     Equipment Recommendations   None recommended by OT     Recommendations for Other Services         Precautions/Restrictions   Precautions Precautions: Fall Restrictions Weight Bearing Restrictions Per Provider Order: No Other Position/Activity Restrictions: monitor blood pressure     Mobility Bed Mobility Overal bed mobility: Needs Assistance          General bed mobility  comments:  (pt was received seated in the bedside chair)    Transfers Overall transfer level: Needs assistance Equipment used: None Transfers: Sit to/from Stand Sit to Stand: Supervision       Balance     Sitting balance-Leahy Scale: Good       Standing balance-Leahy Scale: Fair       ADL either performed or assessed with clinical judgement   ADL Overall ADL's : Needs assistance/impaired Eating/Feeding: Independent;Sitting   Grooming: Contact guard assist;Standing Grooming Details (indicate cue type and reason): She performed performed hand washing in standing at sink level.         Upper Body Dressing : Set up;Sitting   Lower Body Dressing: Set up;Supervision/safety;Sitting/lateral leans Lower Body Dressing Details (indicate cue type and reason): She doffed then donned her socks in sitting at chair level. She implemented the figure 4 technique to perform. Toilet Transfer: Contact guard assist;Ambulation;Grab bars Statistician Details (indicate cue type and reason): She ambulated to and from the bathroom in her room to perform toileting tasks. Toileting- Clothing Manipulation and Hygiene: Contact guard assist;Sit to/from stand Toileting - Clothing Manipulation Details (indicate cue type and reason): Pt performed toileting at bathroom level.             Vision   Additional Comments: She correctly read the time depicted on the wall clock.            Pertinent Vitals/Pain Pain Assessment Pain Assessment: No/denies pain     Extremity/Trunk Assessment Upper Extremity Assessment Upper Extremity Assessment: Overall WFL for tasks assessed;Right hand dominant;RUE deficits/detail;LUE deficits/detail RUE Deficits / Details: AROM WFL. Functional grip strength LUE  Deficits / Details: AROM WFL. Functional grip strength   Lower Extremity Assessment Lower Extremity Assessment: Overall WFL for tasks assessed;RLE deficits/detail;LLE deficits/detail RLE Deficits /  Details: AROM WFL LLE Deficits / Details: AROM WFL       Communication Communication Communication: No apparent difficulties   Cognition Arousal: Alert Behavior During Therapy: WFL for tasks assessed/performed               OT - Cognition Comments: Oriented x4                 Following commands: Intact       Cueing  General Comments   Cueing Techniques: Verbal cues              Home Living Family/patient expects to be discharged to:: Private residence Living Arrangements: Alone Available Help at Discharge: Family;Available PRN/intermittently Type of Home: Apartment Home Access: Level entry     Home Layout: One level     Bathroom Shower/Tub: Walk-in shower         Home Equipment: Shower seat - built in          Prior Functioning/Environment Prior Level of Function : Independent/Modified Independent;Driving             Mobility Comments: Independent with ambulation. ADLs Comments: Independent with ADLs, cooking, cleaning, and driving.    OT Problem List: Decreased strength;Decreased knowledge of use of DME or AE;Decreased activity tolerance   OT Treatment/Interventions: Self-care/ADL training;Therapeutic exercise;Energy conservation;DME and/or AE instruction;Patient/family education;Therapeutic activities      OT Goals(Current goals can be found in the care plan section)   Acute Rehab OT Goals Patient Stated Goal: to return home at discharge OT Goal Formulation: With patient/family Time For Goal Achievement: 08/13/24 Potential to Achieve Goals: Good   OT Frequency:  Min 2X/week       AM-PAC OT 6 Clicks Daily Activity     Outcome Measure Help from another person eating meals?: None Help from another person taking care of personal grooming?: A Little Help from another person toileting, which includes using toliet, bedpan, or urinal?: A Little Help from another person bathing (including washing, rinsing, drying)?: A  Little Help from another person to put on and taking off regular upper body clothing?: None Help from another person to put on and taking off regular lower body clothing?: A Little 6 Click Score: 20   End of Session Equipment Utilized During Treatment: Other (comment) (N/A) Nurse Communication: Mobility status  Activity Tolerance: Patient tolerated treatment well Patient left: in chair;with call bell/phone within reach;with chair alarm set;with family/visitor present  OT Visit Diagnosis: Muscle weakness (generalized) (M62.81)                Time: 8965-8950 OT Time Calculation (min): 15 min Charges:  OT General Charges $OT Visit: 1 Visit OT Evaluation $OT Eval Moderate Complexity: 1 Mod    Charo Philipp J Harris, OTR/L 07/30/2024, 12:09 PM

## 2024-07-30 NOTE — Progress Notes (Signed)
 Physical Therapy Treatment Patient Details Name: SECRET KRISTENSEN MRN: [id number] DOB: Apr 16, 1956 Today's Date: 07/30/2024   History of Present Illness 68 yo female admitted with GI bleed, ABLA, syncope, fall 9/22. Hx of esophageal stenosis, CVA, OSA, DM, CAD, chronic pain, shingles    PT Comments  Pt agreeable to working with therapy.  BP elevated with activity: 140/62 at rest, 164/59 after walking, 172/71 about 5 minutes after that. Pt reported having a headache prior to activity. Tolerated activity well. Denied dizziness. Encouraged pt to try to mobilize more throughout the day with nursing assistance (OOB to bathroom instead of purewick). Discussed d/c plan-she politely declines placement-prefers home. Will update recommendation to HHPT. Encouraged her to arrange to have some in home help/supervision initially-pt receptive. Will continue to progress activity as safely able/pt tolerates.    If plan is discharge home, recommend the following: A little help with walking and/or transfers;A little help with bathing/dressing/bathroom;Assistance with cooking/housework;Assist for transportation;Help with stairs or ramp for entrance   Can travel by private vehicle        Equipment Recommendations  None recommended by PT    Recommendations for Other Services OT consult     Precautions / Restrictions Precautions Precautions: Fall Precaution/Restrictions Comments: monitor BP, symptoms when mobilizing Restrictions Weight Bearing Restrictions Per Provider Order: No     Mobility  Bed Mobility Overal bed mobility: Needs Assistance Bed Mobility: Supine to Sit     Supine to sit: Supervision     General bed mobility comments: Supv for safety, lines.    Transfers Overall transfer level: Needs assistance Equipment used: Rolling walker (2 wheels) Transfers: Sit to/from Stand Sit to Stand: Contact guard assist           General transfer comment: CGA for safety. Denies dizziness     Ambulation/Gait Ambulation/Gait assistance: Contact guard assist, Min assist Gait Distance (Feet): 175 Feet Assistive device:  (hallway handrail) Gait Pattern/deviations: Step-through pattern, Decreased stride length       General Gait Details: Mild, intermittent unsteadiness. Slow, cautions gait pattern. Denies dizziness. Tolerated distance well.   Stairs             Wheelchair Mobility     Tilt Bed    Modified Rankin (Stroke Patients Only)       Balance Overall balance assessment: Needs assistance, History of Falls         Standing balance support: During functional activity Standing balance-Leahy Scale: Fair                              Hotel manager: No apparent difficulties  Cognition Arousal: Alert Behavior During Therapy: WFL for tasks assessed/performed   PT - Cognitive impairments: No apparent impairments                         Following commands: Intact      Cueing Cueing Techniques: Verbal cues  Exercises      General Comments        Pertinent Vitals/Pain Pain Assessment Pain Assessment: No/denies pain    Home Living                          Prior Function            PT Goals (current goals can now be found in the care plan section) Progress towards PT goals: Progressing toward goals  Frequency    Min 3X/week      PT Plan      Co-evaluation              AM-PAC PT 6 Clicks Mobility   Outcome Measure  Help needed turning from your back to your side while in a flat bed without using bedrails?: None Help needed moving from lying on your back to sitting on the side of a flat bed without using bedrails?: None Help needed moving to and from a bed to a chair (including a wheelchair)?: A Little Help needed standing up from a chair using your arms (e.g., wheelchair or bedside chair)?: A Little Help needed to walk in hospital room?: A Little Help  needed climbing 3-5 steps with a railing? : A Lot 6 Click Score: 19    End of Session Equipment Utilized During Treatment: Gait belt Activity Tolerance: Patient tolerated treatment well Patient left: in chair;with call bell/phone within reach   PT Visit Diagnosis: Muscle weakness (generalized) (M62.81);Difficulty in walking, not elsewhere classified (R26.2);History of falling (Z91.81);Unsteadiness on feet (R26.81);Dizziness and giddiness (R42)     Time: 9061-8998 PT Time Calculation (min) (ACUTE ONLY): 23 min  Charges:    $Gait Training: 8-22 mins $Therapeutic Activity: 8-22 mins PT General Charges $$ ACUTE PT VISIT: 1 Visit                        Dannial SQUIBB, PT Acute Rehabilitation  Office: [phone]

## 2024-07-30 NOTE — Plan of Care (Signed)
  Problem: Education: Goal: Knowledge of General Education information will improve Description: Including pain rating scale, medication(s)/side effects and non-pharmacologic comfort measures Outcome: Progressing   Problem: Health Behavior/Discharge Planning: Goal: Ability to manage health-related needs will improve Outcome: Progressing   Problem: Clinical Measurements: Goal: Ability to maintain clinical measurements within normal limits will improve Outcome: Progressing Goal: Diagnostic test results will improve Outcome: Progressing Goal: Respiratory complications will improve Outcome: Progressing Goal: Cardiovascular complication will be avoided Outcome: Progressing   Problem: Activity: Goal: Risk for activity intolerance will decrease Outcome: Progressing   Problem: Coping: Goal: Level of anxiety will decrease Outcome: Progressing

## 2024-07-30 NOTE — Plan of Care (Signed)
  Problem: Education: Goal: Knowledge of General Education information will improve Description: Including pain rating scale, medication(s)/side effects and non-pharmacologic comfort measures Outcome: Progressing   Problem: Clinical Measurements: Goal: Respiratory complications will improve Outcome: Progressing Goal: Cardiovascular complication will be avoided Outcome: Progressing   Problem: Elimination: Goal: Will not experience complications related to urinary retention Outcome: Progressing   Problem: Clinical Measurements: Goal: Ability to maintain clinical measurements within normal limits will improve Outcome: Not Progressing   Problem: Elimination: Goal: Will not experience complications related to bowel motility Outcome: Not Progressing   Problem: Pain Managment: Goal: General experience of comfort will improve and/or be controlled Outcome: Not Progressing

## 2024-07-30 NOTE — Progress Notes (Signed)
 PROGRESS NOTE    Haley [name]  FMW:[id number] DOB: [birth year] DOA: 07/25/2024 PCP: Adella Norris, MD    Brief Narrative:   Haley [name] is a 68 y.o. female with past medical history significant for HTN, GERD/reflux esophagitis, esophageal stenosis s/p dilation 07/03/2024, CVA, diverticulosis, obesity who presented to urgent care initially followed by [facility] on 07/25/2024 with reports of large amount of dark red blood in her stool x 1 day.  Recently started on PPI for reflux esophagitis, but denies NSAID use.  Last colonoscopy 3 years prior when she had hemorrhoids.  Patient also endorses chest tightness, mild dyspnea while walking.  In the ED, temperature 98.0 F, HR 71, RR 14, BP 125/84, SpO2 97% on room air.  WBC 6.3, hemoglobin 10.9, platelet count 280.  Sodium 139, potassium 3.8, chloride 106, CO2 21, glucose 143, BUN 18, creat 1.34.  AST 19, ALT 23, total bilirubin 0.4.  INR 1.0.  High sensitive troponin less than 15.  Urinalysis with trace ketones, large leukocytes, negative nitrite, no bacteria, 21-50 WBCs.  FOBT positive.  Assessment & Plan:   Lower GI bleed, presumed diverticular Acute blood loss anemia Patient presenting with 1 day history of dark red blood in her stool.  Patient endorses baby aspirin  use, otherwise denies NSAID abuse or other antiplatelet/anticoagulant use.  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  CT angiogram GI bleed study with hypervascularity in the region of the rectum with questionable trace intraluminal contrast on delayed image, possible trace active bleeding.  GI was consulted and patient underwent flexible sigmoidoscopy on 07/27/2024 with findings notable for hemorrhoids, large amount of semisolid dark brown/purple stool transverse colon, clotted blood and old maroonish blood entire left colon, no findings of extra extravasation, noted diverticula throughout colon.  Did not tolerate IV iron  due to  hypersensitivity reaction.   -- Hgb 10.9>7.9>>5.0>4.3>4.3>4.6 -- Continues to decline blood transfusion -- Ferrous sulfate  325 mg p.o. twice daily -- B12 1000 mcg p.o. daily --Holding aspirin  -- Monitor on telemetry -- H&H daily  Syncope likely secondary to acute blood loss anemia as above No further episodes, ambulating well with physical therapy.  Acute renal failure: Resolved Patient presenting with an elevated creatinine of 1.34.  Likely secondary to prerenal.  Creatinine improved to 0.9.  HTN -- Lisinopril  20 g p.o. daily  GERD Reflux esophagitis History of esophageal stenosis s/p dilation -- Protonix  40 mg p.o. twice daily -- Outpatient follow-up with GI  History of CVA -- Continue to hold home aspirin    History of sleep apnea Not on CPAP at baseline  Obesity, class I Body mass index is 31.62 kg/m.  On [facility]  outpatient.     DVT prophylaxis: SCDs Start: 07/25/24 2217    Code Status: Full Code Family Communication: No family present bedside this morning  Disposition Plan:  Level of care: Stepdown Status is: Inpatient Remains inpatient appropriate because: Monitoring of hemoglobin, may need home health versus SNF placement    Consultants:  [facility]  Procedures:  Flexible sigmoidoscopy  Antimicrobials:  None   Subjective: Patient seen examined bedside, lying in bed.  Continues to complain of intermittent dizziness.  Was able to ambulate with physical therapy today and remained relatively asymptomatic.  Hemoglobin slightly improved to 4.6 today.  Continues to refuse blood product administration.  Discussed with patient extensively that her hemoglobin will take weeks to months to recover; she seems surprised by that.  Per PT, patient not interested in rehab placement.  No other questions or concerns at this time.  Denies headache, no visual changes, no chest pain, no palpitations, no current shortness of breath, no fever/chills/night  sweats, no nausea/vomiting/diarrhea, no focal weakness, no fatigue, no paresthesias.  No acute events overnight per nursing.  Objective: Vitals:   07/30/24 0600 07/30/24 0632 07/30/24 0800 07/30/24 0907  BP: (!) 93/42 (!) 110/59 (!) 129/53 (!) 140/62  Pulse: 83 93 80   Resp: 20 20 20    Temp:      TempSrc:      SpO2: 97% 98% 100%   Weight:      Height:        Intake/Output Summary (Last 24 hours) at 07/30/2024 1217 Last data filed at 07/29/2024 2345 Gross per 24 hour  Intake 840 ml  Output 2500 ml  Net -1660 ml   Filed Weights   07/26/24 0153 07/27/24 1051  Weight: 75.9 kg 75.9 kg    Examination:  Physical Exam: GEN: NAD, alert and oriented x 3, obese HEENT: NCAT, PERRL, EOMI, sclera clear, MMM PULM: CTAB w/o wheezes/crackles, normal respiratory effort, on room air CV: RRR w/o M/G/R GI: abd soft, NTND, + BS MSK: no peripheral edema NEURO: No focal neurological deficit PSYCH: normal mood/affect Integumentary: dry/intact, no rashes or wounds    Data Reviewed: I have personally reviewed following labs and imaging studies  CBC: Recent Labs  Lab 07/25/24 1345 07/25/24 2239 07/26/24 1757 07/27/24 0508 07/28/24 0247 07/29/24 0336 07/30/24 0329  WBC 6.3  --   --  10.2  --   --   --   HGB 10.9*   < > 5.9* 5.0* 4.3* 4.3* 4.6*  HCT 32.9*   < > 19.1* 16.2* 14.5* 14.1* 15.0*  MCV 94.5  --   --  101.3*  --   --   --   PLT 280  --   --  167  --   --   --    < > = values in this interval not displayed.   Basic Metabolic Panel: Recent Labs  Lab 07/25/24 1345 07/26/24 0454 07/27/24 0508  NA 139 141 141  K 3.8 4.0 3.8  CL 106 111 111  CO2 21* 23 20*  GLUCOSE 143* 101* 101*  BUN 18 19 11   CREATININE 1.34* 1.03* 0.98  CALCIUM  9.9 8.3* 7.9*  MG  --   --  1.8  PHOS  --   --  2.7   GFR: Estimated Creatinine Clearance: 49.8 mL/min (by C-G formula based on SCr of 0.98 mg/dL). Liver Function Tests: Recent Labs  Lab 07/25/24 1345  AST 19  ALT 23  ALKPHOS 53   BILITOT 0.4  PROT 6.7  ALBUMIN 3.9   No results for input(s): LIPASE, AMYLASE in the last 168 hours. No results for input(s): AMMONIA in the last 168 hours. Coagulation Profile: Recent Labs  Lab 07/25/24 1636 07/27/24 0508  INR 1.0 1.2   Cardiac Enzymes: No results for input(s): CKTOTAL, CKMB, CKMBINDEX, TROPONINI in the last 168 hours. BNP (last 3 results) No results for input(s): PROBNP in the last 8760 hours. HbA1C: No results for input(s): HGBA1C in the last 72 hours. CBG: Recent Labs  Lab 07/25/24 2215 07/26/24 1232  GLUCAP 104* 115*   Lipid Profile: No results for input(s): CHOL, HDL, LDLCALC, TRIG, CHOLHDL, LDLDIRECT in the last 72 hours. Thyroid Function Tests: No results for input(s): TSH, T4TOTAL, FREET4, T3FREE, THYROIDAB in the last 72 hours. Anemia Panel: No results for input(s): VITAMINB12, FOLATE, FERRITIN, TIBC,  IRON , RETICCTPCT in the last 72 hours. Sepsis Labs: No results for input(s): PROCALCITON, LATICACIDVEN in the last 168 hours.  Recent Results (from the past 240 hours)  MRSA Next Gen by PCR, Nasal     Status: None   Collection Time: 07/26/24  9:33 AM   Specimen: Nasal Mucosa; Nasal Swab  Result Value Ref Range Status   MRSA by PCR Next Gen NOT DETECTED NOT DETECTED Final    Comment: (NOTE) The GeneXpert MRSA Assay (FDA approved for NASAL specimens only), is one component of a comprehensive MRSA colonization surveillance program. It is not intended to diagnose MRSA infection nor to guide or monitor treatment for MRSA infections. Test performance is not FDA approved in patients less than 17 years old. Performed at [facility], [address], [place], [place] [address]          Radiology Studies: No results found.      Scheduled Meds:  Chlorhexidine  Gluconate Cloth  6 each Topical Daily   vitamin B-12  1,000 mcg Oral Daily   docusate sodium   100 mg Oral  BID   ferrous sulfate   325 mg Oral BID WC   lisinopril   20 mg Oral Daily   pantoprazole   40 mg Oral BID   polyethylene glycol  17 g Oral Daily   Continuous Infusions:   LOS: 5 days    Time spent: 52 minutes spent on 07/30/2024 caring for this patient face-to-face including chart review, ordering labs/tests, documenting, discussion with nursing staff, consultants, updating family and interview/physical exam    Camellia PARAS [place], DO [facility] Available via Epic secure chat 7am-7pm After these hours, please refer to coverage provider listed on [email or web address] 07/30/2024, 12:17 PM

## 2024-07-31 DIAGNOSIS — K5791 Diverticulosis of intestine, part unspecified, without perforation or abscess with bleeding: Secondary | ICD-10-CM | POA: Diagnosis not present

## 2024-07-31 LAB — HEMOGLOBIN AND HEMATOCRIT, BLOOD
HCT: 13.7 % — ABNORMAL LOW (ref 36.0–46.0)
Hemoglobin: 4.2 g/dL — CL (ref 12.0–15.0)

## 2024-07-31 NOTE — Progress Notes (Signed)
 PROGRESS NOTE    Haley [name]  FMW:[id number] DOB: 1956/03/23 DOA: 07/25/2024 PCP: Adella Norris, MD    Brief Narrative:   Haley [name] is a 68 y.o. female with past medical history significant for HTN, GERD/reflux esophagitis, esophageal stenosis s/p dilation 07/03/2024, CVA, diverticulosis, obesity who presented to urgent care initially followed by [facility] on 07/25/2024 with reports of large amount of dark red blood in her stool x 1 day.  Recently started on PPI for reflux esophagitis, but denies NSAID use.  Last colonoscopy 3 years prior when she had hemorrhoids.  Patient also endorses chest tightness, mild dyspnea while walking.  In the ED, temperature 98.0 F, HR 71, RR 14, BP 125/84, SpO2 97% on room air.  WBC 6.3, hemoglobin 10.9, platelet count 280.  Sodium 139, potassium 3.8, chloride 106, CO2 21, glucose 143, BUN 18, creat 1.34.  AST 19, ALT 23, total bilirubin 0.4.  INR 1.0.  High sensitive troponin less than 15.  Urinalysis with trace ketones, large leukocytes, negative nitrite, no bacteria, 21-50 WBCs.  FOBT positive.  Assessment & Plan:   Lower GI bleed, presumed diverticular Acute blood loss anemia Patient presenting with 1 day history of dark red blood in her stool.  Patient endorses baby aspirin  use, otherwise denies NSAID abuse or other antiplatelet/anticoagulant use.  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  CT angiogram GI bleed study with hypervascularity in the region of the rectum with questionable trace intraluminal contrast on delayed image, possible trace active bleeding.  GI was consulted and patient underwent flexible sigmoidoscopy on 07/27/2024 with findings notable for hemorrhoids, large amount of semisolid dark brown/purple stool transverse colon, clotted blood and old maroonish blood entire left colon, no findings of extra extravasation, noted diverticula throughout colon.  Did not tolerate IV iron  due to  hypersensitivity reaction.   -- Hgb 10.9>7.9>>5.0>4.3>4.3>4.6>4.2 -- Continues to decline blood transfusion -- Ferrous sulfate  325 mg p.o. twice daily -- B12 1000 mcg p.o. daily -- Holding aspirin  -- Monitor on telemetry -- H&H daily  Patient is high risk for decompensation such as stroke, MI, death given severe anemia from her lower GI bleed as above.  Discussed with patient on multiple occasions by multiple providers that her hemoglobin will take months to recover and would recommend transfusion in which she continues to refuse on a daily basis due to her wishes/values as a Jehovah's Witness.  Patient is medically competent to make decisions; but patient has been counseled on the potential detrimental effect.  Syncope likely secondary to acute blood loss anemia as above No further episodes, ambulating well with physical therapy.  Acute renal failure: Resolved Patient presenting with an elevated creatinine of 1.34.  Likely secondary to prerenal.  Creatinine improved to 0.9.  HTN -- Lisinopril  20 g p.o. daily  GERD Reflux esophagitis History of esophageal stenosis s/p dilation -- Protonix  40 mg p.o. twice daily -- Outpatient follow-up with GI  History of CVA -- Continue to hold home aspirin    History of sleep apnea Not on CPAP at baseline  Obesity, class I Body mass index is 31.62 kg/m.  On [facility]  outpatient.  Weakness/deconditioning/gait disturbance: -- PT/OT recommending SNF placement, patient declines -- Anticipate home health on discharge -- Continue therapy efforts while inpatient     DVT prophylaxis: SCDs Start: 07/25/24 2217    Code Status: Full Code Family Communication: No family present bedside this morning  Disposition Plan:  Level of care: Stepdown Status is: Inpatient  Remains inpatient appropriate because: Monitoring of hemoglobin, declining SNF placement, anticipate home health on discharge    Consultants:  [facility]  Procedures:  Flexible sigmoidoscopy  Antimicrobials:  None   Subjective: Patient seen examined bedside, lying in bed.  Sleeping but easily arousable.  Continues with intermittent dizziness, although improved.  Worked well with physical therapy yesterday.  Has declined SNF placement.  Discussed once again that it will take many months for hemoglobin to return to previous baseline.  Continues to decline blood transfusion.  Discussed once again detrimental effects that low hemoglobin may have on her body/organ systems. No other questions or concerns at this time.  Denies headache, no visual changes, no chest pain, no palpitations, no current shortness of breath, no fever/chills/night sweats, no nausea/vomiting/diarrhea, no focal weakness, no fatigue, no paresthesias.  No acute events overnight per nursing.  Objective: Vitals:   07/31/24 0500 07/31/24 0600 07/31/24 0800 07/31/24 0904  BP: 132/63 (!) 143/69 128/63 (!) 148/67  Pulse: 85 85 83   Resp: 19 (!) 21 (!) 22   Temp:   98.6 F (37 C)   TempSrc:   Oral   SpO2: 99% 100% 100%   Weight:      Height:        Intake/Output Summary (Last 24 hours) at 07/31/2024 1003 Last data filed at 07/31/2024 0557 Gross per 24 hour  Intake --  Output 700 ml  Net -700 ml   Filed Weights   07/26/24 0153 07/27/24 1051  Weight: 75.9 kg 75.9 kg    Examination:  Physical Exam: GEN: NAD, alert and oriented x 3, obese HEENT: NCAT, PERRL, EOMI, sclera clear, MMM PULM: CTAB w/o wheezes/crackles, normal respiratory effort, on room air CV: RRR w/o M/G/R GI: abd soft, NTND, + BS MSK: no peripheral edema NEURO: No focal neurological deficit PSYCH: normal mood/affect Integumentary: dry/intact, no rashes or wounds    Data Reviewed: I have personally reviewed following labs and imaging studies  CBC: Recent Labs  Lab 07/25/24 1345 07/25/24 2239 07/27/24 0508 07/28/24 0247 07/29/24 0336 07/30/24 0329 07/31/24 0331  WBC 6.3   --  10.2  --   --   --   --   HGB 10.9*   < > 5.0* 4.3* 4.3* 4.6* 4.2*  HCT 32.9*   < > 16.2* 14.5* 14.1* 15.0* 13.7*  MCV 94.5  --  101.3*  --   --   --   --   PLT 280  --  167  --   --   --   --    < > = values in this interval not displayed.   Basic Metabolic Panel: Recent Labs  Lab 07/25/24 1345 07/26/24 0454 07/27/24 0508  NA 139 141 141  K 3.8 4.0 3.8  CL 106 111 111  CO2 21* 23 20*  GLUCOSE 143* 101* 101*  BUN 18 19 11   CREATININE 1.34* 1.03* 0.98  CALCIUM  9.9 8.3* 7.9*  MG  --   --  1.8  PHOS  --   --  2.7   GFR: Estimated Creatinine Clearance: 49.8 mL/min (by C-G formula based on SCr of 0.98 mg/dL). Liver Function Tests: Recent Labs  Lab 07/25/24 1345  AST 19  ALT 23  ALKPHOS 53  BILITOT 0.4  PROT 6.7  ALBUMIN 3.9   No results for input(s): LIPASE, AMYLASE in the last 168 hours. No results for input(s): AMMONIA in the last 168 hours. Coagulation Profile: Recent Labs  Lab 07/25/24 1636 07/27/24 9491  INR 1.0 1.2   Cardiac Enzymes: No results for input(s): CKTOTAL, CKMB, CKMBINDEX, TROPONINI in the last 168 hours. BNP (last 3 results) No results for input(s): PROBNP in the last 8760 hours. HbA1C: No results for input(s): HGBA1C in the last 72 hours. CBG: Recent Labs  Lab 07/25/24 2215 07/26/24 1232  GLUCAP 104* 115*   Lipid Profile: No results for input(s): CHOL, HDL, LDLCALC, TRIG, CHOLHDL, LDLDIRECT in the last 72 hours. Thyroid Function Tests: No results for input(s): TSH, T4TOTAL, FREET4, T3FREE, THYROIDAB in the last 72 hours. Anemia Panel: No results for input(s): VITAMINB12, FOLATE, FERRITIN, TIBC, IRON , RETICCTPCT in the last 72 hours. Sepsis Labs: No results for input(s): PROCALCITON, LATICACIDVEN in the last 168 hours.  Recent Results (from the past 240 hours)  MRSA Next Gen by PCR, Nasal     Status: None   Collection Time: 07/26/24  9:33 AM   Specimen: Nasal Mucosa; Nasal  Swab  Result Value Ref Range Status   MRSA by PCR Next Gen NOT DETECTED NOT DETECTED Final    Comment: (NOTE) The GeneXpert MRSA Assay (FDA approved for NASAL specimens only), is one component of a comprehensive MRSA colonization surveillance program. It is not intended to diagnose MRSA infection nor to guide or monitor treatment for MRSA infections. Test performance is not FDA approved in patients less than 58 years old. Performed at [facility], [address] [place], [place] [address]          Radiology Studies: No results found.      Scheduled Meds:  Chlorhexidine  Gluconate Cloth  6 each Topical Daily   vitamin B-12  1,000 mcg Oral Daily   docusate sodium   100 mg Oral BID   ferrous sulfate   325 mg Oral BID WC   lisinopril   20 mg Oral Daily   pantoprazole   40 mg Oral BID   polyethylene glycol  17 g Oral Daily   Continuous Infusions:   LOS: 6 days    Time spent: 52 minutes spent on 07/31/2024 caring for this patient face-to-face including chart review, ordering labs/tests, documenting, discussion with nursing staff, consultants, updating family and interview/physical exam    Haley PARAS [place], DO [facility] Available via Epic secure chat 7am-7pm After these hours, please refer to coverage provider listed on [email or web address] 07/31/2024, 10:03 AM

## 2024-07-31 NOTE — Plan of Care (Signed)
  Problem: Education: Goal: Knowledge of General Education information will improve Description: Including pain rating scale, medication(s)/side effects and non-pharmacologic comfort measures Outcome: Progressing   Problem: Clinical Measurements: Goal: Respiratory complications will improve Outcome: Progressing Goal: Cardiovascular complication will be avoided Outcome: Progressing   Problem: Nutrition: Goal: Adequate nutrition will be maintained Outcome: Progressing   Problem: Elimination: Goal: Will not experience complications related to urinary retention Outcome: Progressing   

## 2024-07-31 NOTE — Plan of Care (Signed)
  Problem: Clinical Measurements: Goal: Will remain free from infection Outcome: Progressing   Problem: Clinical Measurements: Goal: Respiratory complications will improve Outcome: Progressing   Problem: Nutrition: Goal: Adequate nutrition will be maintained Outcome: Progressing   Problem: Coping: Goal: Level of anxiety will decrease Outcome: Progressing   Problem: Elimination: Goal: Will not experience complications related to bowel motility Outcome: Progressing   Problem: Elimination: Goal: Will not experience complications related to urinary retention Outcome: Progressing

## 2024-07-31 NOTE — Progress Notes (Signed)
 Physical Therapy Treatment Patient Details Name: RETHEL SEBEK MRN: [id number] DOB: 1956/04/06 Today's Date: 07/31/2024   History of Present Illness 68 yo female admitted with GI bleed, ABLA, syncope, fall 9/22. Hx of esophageal stenosis, CVA, OSA, DM, CAD, chronic pain, shingles    PT Comments  Patient eager to ambulate.  Pre- BP 143/56, HR 90,RR 25 Spo2 100%. Post amb- 194/76, 117, 30 RR.  Patient stated that she had a little heaviness in her chest after return from ambulating to BR earlier. No reports of heaviness, no dizziness this walk.   Continue PT, Recommend HHPT.     If plan is discharge home, recommend the following: A little help with walking and/or transfers;A little help with bathing/dressing/bathroom;Assistance with cooking/housework;Assist for transportation;Help with stairs or ramp for entrance   Can travel by private vehicle        Equipment Recommendations  None recommended by PT    Recommendations for Other Services       Precautions / Restrictions Precautions Precaution/Restrictions Comments: monitor BP, symptoms when mobilizing, HGB 4.2 Restrictions Weight Bearing Restrictions Per Provider Order: No Other Position/Activity Restrictions: monitor blood pressure     Mobility  Bed Mobility Overal bed mobility: Independent                  Transfers Overall transfer level: Needs assistance Equipment used: None Transfers: Sit to/from Stand Sit to Stand: Supervision                Ambulation/Gait Ambulation/Gait assistance: Contact guard assist Gait Distance (Feet): 180 Feet Assistive device:  (intermittent use of rail.)   Gait velocity: decr Gait velocity interpretation: <1.31 ft/sec, indicative of household ambulator   General Gait Details: Mild, intermittent unsteadiness. Slow, cautions gait pattern. Denies dizziness. Tolerated distance well.  stays close to rail   Stairs             Wheelchair Mobility     Tilt Bed     Modified Rankin (Stroke Patients Only)       Balance     Sitting balance-Leahy Scale: Good     Standing balance support: During functional activity Standing balance-Leahy Scale: Fair                              Communication    Cognition Arousal: Alert Behavior During Therapy: WFL for tasks assessed/performed                                    Cueing    Exercises      General Comments        Pertinent Vitals/Pain Pain Assessment Pain Assessment: No/denies pain    Home Living                          Prior Function            PT Goals (current goals can now be found in the care plan section) Progress towards PT goals: Progressing toward goals    Frequency    Min 3X/week      PT Plan      Co-evaluation              AM-PAC PT 6 Clicks Mobility   Outcome Measure  Help needed turning from your back to your side while in a flat bed without  using bedrails?: None Help needed moving from lying on your back to sitting on the side of a flat bed without using bedrails?: None Help needed moving to and from a bed to a chair (including a wheelchair)?: A Little Help needed standing up from a chair using your arms (e.g., wheelchair or bedside chair)?: A Little   Help needed climbing 3-5 steps with a railing? : A Lot 6 Click Score: 16    End of Session Equipment Utilized During Treatment: Gait belt Activity Tolerance: Patient tolerated treatment well Patient left: in bed;with call bell/phone within reach   PT Visit Diagnosis: Muscle weakness (generalized) (M62.81);Difficulty in walking, not elsewhere classified (R26.2);History of falling (Z91.81);Unsteadiness on feet (R26.81);Dizziness and giddiness (R42)     Time: 8374-8362 PT Time Calculation (min) (ACUTE ONLY): 12 min  Charges:    $Gait Training: 8-22 mins PT General Charges $$ ACUTE PT VISIT: 1 Visit                    Darice Potters PT Acute  Rehabilitation Services Office [phone]   Potters Darice Norris 07/31/2024, 4:41 PM

## 2024-08-01 ENCOUNTER — Telehealth: Payer: Self-pay | Admitting: Internal Medicine

## 2024-08-01 LAB — HEMOGLOBIN AND HEMATOCRIT, BLOOD
HCT: 15.3 % — ABNORMAL LOW (ref 36.0–46.0)
Hemoglobin: 4.6 g/dL — CL (ref 12.0–15.0)

## 2024-08-01 MED ORDER — FERROUS SULFATE 325 (65 FE) MG PO TABS: 325.0000 mg | ORAL_TABLET | Freq: Two times a day (BID) | ORAL | 0 refills | Status: DC

## 2024-08-01 MED ORDER — POLYETHYLENE GLYCOL 3350 17 G PO PACK: 17.0000 g | PACK | Freq: Every day | ORAL | 3 refills | Status: AC | PRN

## 2024-08-01 MED ORDER — DOCUSATE SODIUM 100 MG PO CAPS: 100.0000 mg | ORAL_CAPSULE | Freq: Two times a day (BID) | ORAL | 0 refills | Status: AC

## 2024-08-01 NOTE — Plan of Care (Signed)
  Problem: Education: Goal: Knowledge of General Education information will improve Description: Including pain rating scale, medication(s)/side effects and non-pharmacologic comfort measures Outcome: Progressing   Problem: Health Behavior/Discharge Planning: Goal: Ability to manage health-related needs will improve Outcome: Progressing   Problem: Clinical Measurements: Goal: Ability to maintain clinical measurements within normal limits will improve Outcome: Progressing Goal: Will remain free from infection Outcome: Progressing Goal: Respiratory complications will improve Outcome: Progressing Goal: Cardiovascular complication will be avoided Outcome: Progressing   Problem: Activity: Goal: Risk for activity intolerance will decrease Outcome: Progressing   Problem: Nutrition: Goal: Adequate nutrition will be maintained Outcome: Progressing   Problem: Coping: Goal: Level of anxiety will decrease Outcome: Progressing   Problem: Elimination: Goal: Will not experience complications related to urinary retention Outcome: Progressing   Problem: Pain Managment: Goal: General experience of comfort will improve and/or be controlled Outcome: Progressing   Problem: Safety: Goal: Ability to remain free from injury will improve Outcome: Progressing   Problem: Skin Integrity: Goal: Risk for impaired skin integrity will decrease Outcome: Progressing   Problem: Clinical Measurements: Goal: Diagnostic test results will improve Outcome: Not Progressing   Problem: Elimination: Goal: Will not experience complications related to bowel motility Outcome: Not Progressing   Cindy S. Loreli BSN, RN, CCRP, CCRN 08/01/2024 4:06 AM

## 2024-08-01 NOTE — Telephone Encounter (Signed)
 Patient states she was at the ICU at [facility] long,  Patient states she was told to schedule a one week follow up with her PCP.  We will call patient when there is a cancellation next week.

## 2024-08-01 NOTE — Discharge Summary (Signed)
 Physician Discharge Summary  Haley [name] FMW:[id number] DOB: 1955/11/25 DOA: 07/25/2024  PCP: Adella Norris, MD  Admit date: 07/25/2024 Discharge date: 08/01/2024  Admitted From: Home Disposition: Home  Recommendations for Outpatient Follow-up:  Follow up with PCP in 1-2 weeks Follow-up with gastroenterology 2-3 weeks Holding aspirin  on discharge given significant acute blood loss anemia secondary to LGIB likely secondary to diverticular source Please obtain hemoglobin hematocrit in one week to ensure hemoglobin continues to uptrend  Home Health: PT Equipment/Devices: Rollator  Discharge Condition: Stable CODE STATUS: Full code Diet recommendation: Heart healthy diet  History of present illness:  Haley [name] is a 68 y.o. female with past medical history significant for HTN, GERD/reflux esophagitis, esophageal stenosis s/p dilation 07/03/2024, CVA, diverticulosis, obesity who presented to urgent care initially followed by [facility] ED on 07/25/2024 with reports of large amount of dark red blood in her stool x 1 day.  Recently started on PPI for reflux esophagitis, but denies NSAID use.  Last colonoscopy 3 years prior when she had hemorrhoids.  Patient also endorses chest tightness, mild dyspnea while walking.   In the ED, temperature 98.0 F, HR 71, RR 14, BP 125/84, SpO2 97% on room air.  WBC 6.3, hemoglobin 10.9, platelet count 280.  Sodium 139, potassium 3.8, chloride 106, CO2 21, glucose 143, BUN 18, creat 1.34.  AST 19, ALT 23, total bilirubin 0.4.  INR 1.0.  High sensitive troponin less than 15.  Urinalysis with trace ketones, large leukocytes, negative nitrite, no bacteria, 21-50 WBCs.  FOBT positive.  Hospital course:  Lower GI bleed, presumed diverticular Acute blood loss anemia Patient presenting with 1 day history of dark red blood in her stool.  Patient endorses baby aspirin  use, otherwise denies NSAID abuse or other antiplatelet/anticoagulant  use.  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  CT angiogram GI bleed study with hypervascularity in the region of the rectum with questionable trace intraluminal contrast on delayed image, possible trace active bleeding.  GI was consulted and patient underwent flexible sigmoidoscopy on 07/27/2024 with findings notable for hemorrhoids, large amount of semisolid dark brown/purple stool transverse colon, clotted blood and old maroonish blood entire left colon, no findings of extra extravasation, noted diverticula throughout colon.  Did not tolerate IV iron  due to hypersensitivity reaction.  Started on ferrous sulfate  325 mg p.o. twice daily, vitamin B12 1000 mcg p.o. daily.  Continue to hold aspirin  on discharge.  Hemoglobin 4.6 at time of discharge.  Recommend repeat hemoglobin hematocrit 1 week to ensure uptrending.  Outpatient follow-up with GI.   Patient is high risk for decompensation such as stroke, MI, death given severe anemia from her lower GI bleed as above.  Discussed with patient on multiple occasions by multiple providers that her hemoglobin will take months to recover and would recommend transfusion in which she continues to refuse on a daily basis due to her wishes/values as a Jehovah's Witness.  Patient is medically competent to make decisions; but patient has been counseled on the potential detrimental effect.   Syncope likely secondary to acute blood loss anemia as above No further episodes, ambulating well with physical therapy.   Acute renal failure: Resolved Patient presenting with an elevated creatinine of 1.34.  Likely secondary to prerenal.  Creatinine improved to 0.9.   HTN Lisinopril  20 g p.o. daily   GERD Reflux esophagitis History of esophageal stenosis s/p dilation Protonix  40 mg p.o. twice daily, Outpatient follow-up with GI   History  of CVA Continue to hold home aspirin     History of sleep apnea Not on CPAP at baseline   Obesity,  class I Body mass index is 31.62 kg/m.  On Mounjaro  outpatient.   Weakness/deconditioning/gait disturbance: Discharging with home health PT services    Discharge Diagnoses:  Principal Problem:   GI bleed Active Problems:   Hematochezia   Anemia    Discharge Instructions  Discharge Instructions     Call MD for:  difficulty breathing, headache or visual disturbances   Complete by: As directed    Call MD for:  extreme fatigue   Complete by: As directed    Call MD for:  persistant dizziness or light-headedness   Complete by: As directed    Call MD for:  persistant nausea and vomiting   Complete by: As directed    Call MD for:  severe uncontrolled pain   Complete by: As directed    Call MD for:  temperature >100.4   Complete by: As directed    Diet - low sodium heart healthy   Complete by: As directed    Increase activity slowly   Complete by: As directed    No wound care   Complete by: As directed       Allergies as of 08/01/2024       Reactions   Iron  Sucrose Shortness Of Breath, Other (See Comments), Hypertension   IV IRON  INFUSION: Patient with chest tightness and heaviness, difficulty breathing, left arm heaviness and numbness, SBP increases 30-50 points quickly with impending doom. Symptoms resolve when BP comes down. Attempted multiple rates (132.5, 66, 20 mL/hr) with same symptoms following.    Praluent  [alirocumab ]    Had significant fatigue and memory loss--was lost driving in town.  Joint pain and nausea as well.   Zetia [ezetimibe] Other (See Comments)   Joint pain and ear ringing   Atorvastatin  Other (See Comments)   Pt states causes bilateral lower extremity muscle cramps   Pravastatin  Nausea And Vomiting, Other (See Comments)   Causes dizziness   Repatha  [evolocumab ] Other (See Comments)   cramps   Rosuvastatin  Other (See Comments)   Leg cramping on 5-10mg  daily        Medication List     PAUSE taking these medications    aspirin  81 MG  tablet Wait to take this until your doctor or other care provider tells you to start again. Take 81 mg by mouth daily.       TAKE these medications    Accu-Chek Guide Control Liqd   Accu-Chek Guide Test test strip Generic drug: glucose blood SMARTSIG:Strip(s)   B-12 PO Take 1 tablet by mouth daily.   BLUE-EMU SUPER STRENGTH EX Apply 1 application topically daily as needed (pain).   Calcium  Citrate 250 MG Tabs 2 tabs by mouth twice daily.   Dialyvite Vitamin D  5000 125 MCG (5000 UT) capsule Generic drug: Cholecalciferol Take 15,000 Units by mouth daily.   docusate sodium  100 MG capsule Commonly known as: COLACE Take 1 capsule (100 mg total) by mouth 2 (two) times daily.   ferrous sulfate  325 (65 FE) MG tablet Take 1 tablet (325 mg total) by mouth 2 (two) times daily with a meal.   FLAXSEED (LINSEED) PO Take 1 Dose by mouth in the morning. Crushed flaxseed added to yogurt at breakfast.   lisinopril  40 MG tablet Commonly known as: ZESTRIL  Take 1 tablet (40 mg total) by mouth daily.   metoprolol  succinate 50 MG 24 hr  tablet Commonly known as: TOPROL -XL Take 1 tablet (50 mg total) by mouth as needed (PALPITATIONS). Take with or immediately following a meal.   mometasone  50 MCG/ACT nasal spray Commonly known as: NASONEX  USE 2 SPRAY(S) IN EACH NOSTRIL ONCE DAILY What changed:  how much to take how to take this when to take this reasons to take this   Mounjaro  15 MG/0.5ML Pen Generic drug: tirzepatide  Inject 15 mg into the skin once a week.   multivitamin with minerals tablet Take 1 tablet by mouth daily.   nitroGLYCERIN  0.4 MG SL tablet Commonly known as: NITROSTAT  DISSOLVE ONE TABLET UNDER THE TONGUE EVERY 5 MINUTES AS NEEDED FOR CHEST PAIN.  DO NOT EXCEED A TOTAL OF 3 DOSES IN 15 MINUTES   omega-3 acid ethyl esters 1 g capsule Commonly known as: LOVAZA  Take 1 capsule (1 g total) by mouth 2 (two) times daily.   pantoprazole  40 MG tablet Commonly known  as: PROTONIX  Take 1 tablet (40 mg total) by mouth 2 (two) times daily before a meal.   Pharmacist Choice Alcohol Pads   Pharmacist Choice Lancets Misc   polyethylene glycol 17 g packet Commonly known as: MIRALAX  / GLYCOLAX  Take 17 g by mouth daily as needed.               Durable Medical Equipment  (From admission, onward)           Start     Ordered   08/01/24 0826  For home use only DME 4 wheeled rolling walker with seat  Once       Question:  Patient needs a walker to treat with the following condition  Answer:  Gait abnormality   08/01/24 0825            Follow-up Information     Adella Norris, MD. Schedule an appointment as soon as possible for a visit in 1 week(s).   Specialty: Internal Medicine Contact information: [address] [place] [address] [phone]         Charlanne Groom, MD. Schedule an appointment as soon as possible for a visit.   Specialties: Gastroenterology, Internal Medicine Contact information: [address] [place] [address] [phone]                Allergies  Allergen Reactions   Iron  Sucrose Shortness Of Breath, Other (See Comments) and Hypertension    IV IRON  INFUSION: Patient with chest tightness and heaviness, difficulty breathing, left arm heaviness and numbness, SBP increases 30-50 points quickly with impending doom. Symptoms resolve when BP comes down. Attempted multiple rates (132.5, 66, 20 mL/hr) with same symptoms following.    Praluent  [Alirocumab ]     Had significant fatigue and memory loss--was lost driving in town.  Joint pain and nausea as well.   Zetia [Ezetimibe] Other (See Comments)    Joint pain and ear ringing   Atorvastatin  Other (See Comments)    Pt states causes bilateral lower extremity muscle cramps   Pravastatin  Nausea And Vomiting and Other (See Comments)    Causes dizziness   Repatha  [Evolocumab ] Other (See Comments)    cramps   Rosuvastatin  Other (See Comments)     Leg cramping on 5-10mg  daily    Consultations: [place] [facility]   Procedures/Studies: CT HEAD WO CONTRAST ( ) Result Date: 07/28/2024 CLINICAL DATA:  68 year old female status post unwitnessed fall. Pain. EXAM: CT HEAD WITHOUT CONTRAST TECHNIQUE: Contiguous axial images were obtained from the base of the skull through the vertex  without intravenous contrast. RADIATION DOSE REDUCTION: This exam was performed according to the departmental dose-optimization program which includes automated exposure control, adjustment of the mA and/or kV according to patient size and/or use of iterative reconstruction technique. COMPARISON:  None Available. FINDINGS: Brain: Cerebral volume is within normal limits for age. No midline shift, ventriculomegaly, mass effect, evidence of mass lesion, intracranial hemorrhage or evidence of cortically based acute infarction. Patchy basal ganglia vascular calcifications more apparent on the right. Patchy bilateral cerebral white matter hypodensity, most pronounced adjacent to the left frontal horn (coronal image 25). And similar asymmetric hypodensity in the right thalamus which is circumscribed on coronal image 41 and appears to be a small chronic lacunar infarct. Vascular: Calcified atherosclerosis at the skull base. No suspicious intracranial vascular hyperdensity. Skull: Intact.  No acute osseous abnormality identified. Sinuses/Orbits: Visualized paranasal sinuses and mastoids are well aerated. Other: Left face premalar space partially visible soft tissue swelling and stranding compatible with patchy hematoma/contusion. This is inferolateral to the left orbit overlying the left zygoma which appears to remain intact. Other orbit and scalp soft tissues appear within normal limits. IMPRESSION: 1. Partially visible left face soft tissue hematoma/contusion. Underlying left zygoma intact. 2. No acute intracranial abnormality. Mild to moderate for age chronic small vessel  disease, including in the right thalamus. Electronically Signed   By: VEAR Hurst M.D.   On: 07/28/2024 08:27   DG CHEST PORT 1 VIEW Result Date: 07/28/2024 CLINICAL DATA:  68 year old female status post unwitnessed fall. Pain. EXAM: PORTABLE CHEST 1 VIEW COMPARISON:  CTA abdomen 07/26/2024.  Portable chest 03/05/2024. FINDINGS: Portable AP semi upright view at 0744 hours. Borderline to mild cardiomegaly. Somewhat low lung volumes. Negative lung bases on recent CTA. Mediastinal contours stable since April. Calcified aortic atherosclerosis. Visualized tracheal air column is within normal limits. Allowing for portable technique the lungs are clear. No pneumothorax or pleural effusion. Stable cholecystectomy clips. Negative visible bowel gas. Chronic bilateral shoulder degeneration. No acute osseous abnormality identified. IMPRESSION: No acute cardiopulmonary abnormality or acute traumatic injury identified. Electronically Signed   By: VEAR Hurst M.D.   On: 07/28/2024 07:59   CT ANGIO GI BLEED Result Date: 07/26/2024 CLINICAL DATA:  GI bleed. EXAM: CTA ABDOMEN AND PELVIS WITHOUT AND WITH CONTRAST TECHNIQUE: Multidetector CT imaging of the abdomen and pelvis was performed using the standard protocol during bolus administration of intravenous contrast. Multiplanar reconstructed images and MIPs were obtained and reviewed to evaluate the vascular anatomy. RADIATION DOSE REDUCTION: This exam was performed according to the departmental dose-optimization program which includes automated exposure control, adjustment of the mA and/or kV according to patient size and/or use of iterative reconstruction technique. CONTRAST:  OMNIPAQUE  IOHEXOL  350 MG/ML SOLN COMPARISON:  12/20/2023. FINDINGS: VASCULAR Aorta: Normal caliber aorta without aneurysm, dissection, vasculitis or significant stenosis. Aortic atherosclerosis. Celiac: Patent without evidence of aneurysm, dissection, vasculitis or significant stenosis. SMA: Patent  without evidence of aneurysm, dissection, vasculitis or significant stenosis. Renals: Both renal arteries are patent without evidence of aneurysm, dissection, vasculitis, fibromuscular dysplasia or significant stenosis. Accessory renal artery is noted on the left. IMA: Patent. There is hypervascularity in the region of the rectum with questionable intraluminal hyperdense material in the rectum on delayed phase, series 11 axial image 81. Inflow: Patent without evidence of aneurysm, dissection, vasculitis or significant stenosis. Proximal Outflow: Bilateral common femoral and visualized portions of the superficial and profunda femoral arteries are patent without evidence of aneurysm, dissection, vasculitis or significant stenosis. Veins: No obvious venous abnormality.  Review of the MIP images confirms the above findings. NON-VASCULAR Lower chest: Coronary artery calcifications are present. The lung bases are clear. Hepatobiliary: No focal liver abnormality is seen. No gallstones, gallbladder wall thickening, or biliary dilatation. Pancreas: Unremarkable. No pancreatic ductal dilatation or surrounding inflammatory changes. Spleen: Normal in size without focal abnormality. Adrenals/Urinary Tract: There stable thickening in the left adrenal gland without discrete nodule. The right adrenal gland is within normal limits. The kidneys enhance symmetrically. No renal calculus or obstructive uropathy bilaterally. The bladder is unremarkable. Stomach/Bowel: There is a small hiatal hernia. The stomach is otherwise within normal limits. No bowel obstruction, free air, or pneumatosis is seen. Appendix appears normal. There are scattered diverticula along the colon without evidence of diverticulitis. Lymphatic: No abdominal or pelvic lymphadenopathy. Reproductive: Status post hysterectomy. No adnexal masses. Other: No abdominopelvic ascites. There is a large complex fat containing periumbilical hernia in the midline.  Musculoskeletal: Degenerative changes are present in the thoracolumbar spine. There sclerosis at the sacroiliac joints bilaterally, compatible with sacroiliitis. No acute osseous abnormality is seen. IMPRESSION: VASCULAR 1. Hypervascularity in the region of the rectum with questionable trace intraluminal contrast on delayed image, possible trace active bleeding. 2. Aortic atherosclerosis. 3. Coronary artery calcifications. NON-VASCULAR 1. Diverticulosis without diverticulitis. 2. Small hiatal hernia. 3. Large complex periumbilical hernia. Electronically Signed   By: Leita Birmingham M.D.   On: 07/26/2024 14:40     Subjective: Patient seen examined bedside, lying in bed.  No complaints this morning.  Hemoglobin up to 4.6 today.  Remains asymptomatic, worked with physical therapy yesterday.  Discharging home today.  Continues to decline transfusion; patient understands underlying risks.  No other specific questions, concerns or complaints at this time.  Denies headache, no dizziness, no visual changes, no chest pain, no palpitations, no shortness of breath, no abdominal pain, no fever/chills/night sweats, no nausea/vomiting/diarrhea, no focal weakness, no fatigue, no paresthesias.  No acute events overnight per nursing.  Discharge Exam: Vitals:   08/01/24 0400 08/01/24 0600  BP: 138/69 (!) 142/59  Pulse:  82  Resp: 19 (!) 23  Temp:    SpO2:  99%   Vitals:   08/01/24 0321 08/01/24 0328 08/01/24 0400 08/01/24 0600  BP:  (!) 145/66 138/69 (!) 142/59  Pulse: 81 78  82  Resp: 19 20 19  (!) 23  Temp: 98.3 F (36.8 C)     TempSrc: Oral     SpO2: 100% 98%  99%  Weight:      Height:        Physical Exam: GEN: NAD, alert and oriented x 3, obese HEENT: NCAT, PERRL, EOMI, sclera clear, MMM PULM: CTAB w/o wheezes/crackles, normal respiratory effort, on room air CV: RRR w/o M/G/R GI: abd soft, NTND, NABS, no R/G/M MSK: no peripheral edema, muscle strength globally intact 5/5 bilateral upper/lower  extremities NEURO: CN II-XII intact, no focal deficits, sensation to light touch intact PSYCH: normal mood/affect Integumentary: dry/intact, no rashes or wounds    The results of significant diagnostics from this hospitalization (including imaging, microbiology, ancillary and laboratory) are listed below for reference.     Microbiology: Recent Results (from the past 240 hours)  MRSA Next Gen by PCR, Nasal     Status: None   Collection Time: 07/26/24  9:33 AM   Specimen: Nasal Mucosa; Nasal Swab  Result Value Ref Range Status   MRSA by PCR Next Gen NOT DETECTED NOT DETECTED Final    Comment: (NOTE) The GeneXpert MRSA Assay (FDA approved for NASAL  specimens only), is one component of a comprehensive MRSA colonization surveillance program. It is not intended to diagnose MRSA infection nor to guide or monitor treatment for MRSA infections. Test performance is not FDA approved in patients less than 58 years old. Performed at [facility], [address], [place], [place] [address]      Labs: BNP (last 3 results) No results for input(s): BNP in the last 8760 hours. Basic Metabolic Panel: Recent Labs  Lab 07/25/24 1345 07/26/24 0454 07/27/24 0508  NA 139 141 141  K 3.8 4.0 3.8  CL 106 111 111  CO2 21* 23 20*  GLUCOSE 143* 101* 101*  BUN 18 19 11   CREATININE 1.34* 1.03* 0.98  CALCIUM  9.9 8.3* 7.9*  MG  --   --  1.8  PHOS  --   --  2.7   Liver Function Tests: Recent Labs  Lab 07/25/24 1345  AST 19  ALT 23  ALKPHOS 53  BILITOT 0.4  PROT 6.7  ALBUMIN 3.9   No results for input(s): LIPASE, AMYLASE in the last 168 hours. No results for input(s): AMMONIA in the last 168 hours. CBC: Recent Labs  Lab 07/25/24 1345 07/25/24 2239 07/27/24 0508 07/28/24 0247 07/29/24 0336 07/30/24 0329 07/31/24 0331 08/01/24 0319  WBC 6.3  --  10.2  --   --   --   --   --   HGB 10.9*   < > 5.0* 4.3* 4.3* 4.6* 4.2* 4.6*  HCT 32.9*   < > 16.2* 14.5*  14.1* 15.0* 13.7* 15.3*  MCV 94.5  --  101.3*  --   --   --   --   --   PLT 280  --  167  --   --   --   --   --    < > = values in this interval not displayed.   Cardiac Enzymes: No results for input(s): CKTOTAL, CKMB, CKMBINDEX, TROPONINI in the last 168 hours. BNP: Invalid input(s): POCBNP CBG: Recent Labs  Lab 07/25/24 2215 07/26/24 1232  GLUCAP 104* 115*   D-Dimer No results for input(s): DDIMER in the last 72 hours. Hgb A1c No results for input(s): HGBA1C in the last 72 hours. Lipid Profile No results for input(s): CHOL, HDL, LDLCALC, TRIG, CHOLHDL, LDLDIRECT in the last 72 hours. Thyroid function studies No results for input(s): TSH, T4TOTAL, T3FREE, THYROIDAB in the last 72 hours.  Invalid input(s): FREET3 Anemia work up No results for input(s): VITAMINB12, FOLATE, FERRITIN, TIBC, IRON , RETICCTPCT in the last 72 hours. Urinalysis    Component Value Date/Time   COLORURINE YELLOW 07/25/2024 1405   APPEARANCEUR HAZY (A) 07/25/2024 1405   LABSPEC 1.028 07/25/2024 1405   PHURINE 5.5 07/25/2024 1405   GLUCOSEU NEGATIVE 07/25/2024 1405   HGBUR TRACE (A) 07/25/2024 1405   BILIRUBINUR SMALL (A) 07/25/2024 1405   BILIRUBINUR negative 12/17/2023 1724   KETONESUR TRACE (A) 07/25/2024 1405   PROTEINUR 30 (A) 07/25/2024 1405   UROBILINOGEN 0.2 (A) 12/17/2023 1724   NITRITE NEGATIVE 07/25/2024 1405   LEUKOCYTESUR LARGE (A) 07/25/2024 1405   Sepsis Labs Recent Labs  Lab 07/25/24 1345 07/27/24 0508  WBC 6.3 10.2   Microbiology Recent Results (from the past 240 hours)  MRSA Next Gen by PCR, Nasal     Status: None   Collection Time: 07/26/24  9:33 AM   Specimen: Nasal Mucosa; Nasal Swab  Result Value Ref Range Status   MRSA by PCR Next Gen NOT DETECTED NOT DETECTED  Final    Comment: (NOTE) The GeneXpert MRSA Assay (FDA approved for NASAL specimens only), is one component of a comprehensive MRSA colonization  surveillance program. It is not intended to diagnose MRSA infection nor to guide or monitor treatment for MRSA infections. Test performance is not FDA approved in patients less than 9 years old. Performed at [facility], [address], [place], [place] [address]      Time coordinating discharge: Over 30 minutes  SIGNED:   Camellia PARAS [place], DO  [facility] 08/01/2024, 8:26 AM

## 2024-08-01 NOTE — Plan of Care (Signed)
 Pt discharge to home, understands all instructions.

## 2024-08-01 NOTE — TOC Transition Note (Addendum)
 Transition of Care [facility]) - Discharge Note   Patient Details  Name: Haley [name] MRN: [id number] Date of Birth: 04/27/1956  Transition [facility]) CM/SW Contact:  Jon ONEIDA Anon, RN Phone Number: 08/01/2024, 8:47 AM   Clinical Narrative:    Pt to DC home with home health services. HH PT set up through [facility]. HH orders are in place. Order for Home DME rollator sent or Jermaine with [employer], to be delivered to pt hospital room before DC. There are no further ICM needs at this time.    Final next level of care: Home w Home Health Services Barriers to Discharge: No Barriers Identified   Patient Goals and CMS Choice Patient states their goals for this hospitalization and ongoing recovery are:: To return home CMS Medicare.gov Compare Post Acute Care list provided to:: Patient Choice offered to / list presented to : Patient [name] ownership interest in [facility].provided to:: Patient    Discharge Placement                       Discharge Plan and Services Additional resources added to the After Visit Summary for                  DME Arranged: N/A DME Agency: NA       HH Arranged: PT HH Agency: [facility] Date [facility] Agency Contacted: 07/31/24 Time HH Agency Contacted: [phone] Representative spoke with at [facility] Agency: Amy Hyatt  Social Drivers of Health (SDOH) Interventions SDOH Screenings   Food Insecurity: No Food Insecurity (07/25/2024)  Housing: Low Risk  (07/25/2024)  Transportation Needs: No Transportation Needs (07/25/2024)  Utilities: Not At Risk (07/25/2024)  Financial Resource Strain: Low Risk  (04/18/2023)  Physical Activity: Unknown (08/15/2021)   Received from [facility]  Social Connections: Moderately Isolated (07/25/2024)  Stress: Unknown (08/15/2021)   Received from [facility]  Tobacco Use: Low Risk  (07/27/2024)     Readmission Risk Interventions    07/28/2024   12:03 PM  Readmission Risk Prevention Plan   Post Dischage Appt Complete  Medication Screening Complete  Transportation Screening Complete

## 2024-08-05 ENCOUNTER — Telehealth: Payer: Self-pay | Admitting: Internal Medicine

## 2024-08-05 NOTE — Telephone Encounter (Signed)
 [name] left a voicemail states that she is doing a home health physical therapy evaluation following a recent discharge from  hospital . [name] states she would like to get a verbal approval to start seen patient for twice a week for 3 weeks and one time a week for reassessment and twice a week for two weeks.  Call back for verbal approval to number [phone].

## 2024-08-06 NOTE — Telephone Encounter (Signed)
 Spoke with the home health company and provided verbal order as requested.

## 2024-08-06 NOTE — Telephone Encounter (Signed)
 Patient has been scheduled for Labs (CBC) On Friday 08-08-2024.

## 2024-08-08 ENCOUNTER — Other Ambulatory Visit (INDEPENDENT_AMBULATORY_CARE_PROVIDER_SITE_OTHER)

## 2024-08-08 DIAGNOSIS — I10 Essential (primary) hypertension: Secondary | ICD-10-CM

## 2024-08-08 NOTE — Telephone Encounter (Signed)
 Patient has been scheduled

## 2024-08-09 LAB — CBC WITH DIFFERENTIAL/PLATELET
Basophils Absolute: 0.1 x10E3/uL (ref 0.0–0.2)
Basos: 1 %
EOS (ABSOLUTE): 0.2 x10E3/uL (ref 0.0–0.4)
Eos: 3 %
Hematocrit: 25.1 % — ABNORMAL LOW (ref 34.0–46.6)
Hemoglobin: 7.5 g/dL — ABNORMAL LOW (ref 11.1–15.9)
Immature Grans (Abs): 0 x10E3/uL (ref 0.0–0.1)
Immature Granulocytes: 0 %
Lymphocytes Absolute: 1.8 x10E3/uL (ref 0.7–3.1)
Lymphs: 28 %
MCH: 32.1 pg (ref 26.6–33.0)
MCHC: 29.9 g/dL — ABNORMAL LOW (ref 31.5–35.7)
MCV: 107 fL — ABNORMAL HIGH (ref 79–97)
Monocytes Absolute: 0.6 x10E3/uL (ref 0.1–0.9)
Monocytes: 9 %
NRBC: 1 % — ABNORMAL HIGH (ref 0–0)
Neutrophils Absolute: 3.7 x10E3/uL (ref 1.4–7.0)
Neutrophils: 58 %
Platelets: 511 x10E3/uL — ABNORMAL HIGH (ref 150–450)
RBC: 2.34 x10E6/uL — CL (ref 3.77–5.28)
RDW: 15 % (ref 11.7–15.4)
WBC: 6.4 x10E3/uL (ref 3.4–10.8)

## 2024-08-10 ENCOUNTER — Ambulatory Visit: Payer: Self-pay | Admitting: Internal Medicine

## 2024-08-11 ENCOUNTER — Ambulatory Visit: Admitting: Internal Medicine

## 2024-08-11 VITALS — BP 132/86 | HR 83 | Resp 18 | Ht 62.0 in | Wt 169.0 lb

## 2024-08-11 DIAGNOSIS — I10 Essential (primary) hypertension: Secondary | ICD-10-CM

## 2024-08-11 DIAGNOSIS — H538 Other visual disturbances: Secondary | ICD-10-CM

## 2024-08-11 DIAGNOSIS — K5791 Diverticulosis of intestine, part unspecified, without perforation or abscess with bleeding: Secondary | ICD-10-CM

## 2024-08-11 DIAGNOSIS — D509 Iron deficiency anemia, unspecified: Secondary | ICD-10-CM

## 2024-08-11 DIAGNOSIS — Z8719 Personal history of other diseases of the digestive system: Secondary | ICD-10-CM

## 2024-08-11 NOTE — Progress Notes (Signed)
 Seen and agree with Dr. Telford plan. Likely lower GI bleed/diverticular bleed for which she was hospitalized 9/19 through 9/26   GI bleed with extreme anemia in a patient refusing blood products due to religious beliefs (Jehovah's Witness).  Her hemoglobin dropped to 4.2.  Recently repeated and up to 7.5 three days ago with iron  and B12 supplementation.  Flex sig, CT angio, EGD did not show source of bleed.  Continue to hold ASA.  2.  Blurriness in right eye comes and goes, but today all day.  Her eye doctor is Dr. Octavia.  She has not spoken with his office yet.  Not able to see disc well today Palpebral conjunctivae quite pale.  3.  Chest pain:  took NTG on Saturday when BP already low in 98/54 range.  Did not lie down.  Pain was gone in seconds.  BP was low before took NTG.  She is taking 40 mg of Lisinopril  and 50 mg of Metoprolol  XL.  Takes at same time in morning.  She brings in BP and pulse checks with HR ranging in low 70s to high 80s.  Denies orthostatic changes, but fatigued after a shower--has built in shower chair.   Lungs clear and CV exam with RRR without murmur or rub.  Plan:    GI bleed and anemia:  Weekly CBC.  She has GI follow up in Novermber.  To ED if recurrent hematochezia, GI blood loss.  Continue iron  and B12.  She would like to add folic acid, which is fine.  She requested referral to nutrition to learn more specifics about her diet with diverticulosis.  Discussed a high fiber diet and that current studies do not support avoiding nuts and seeds to prevent diverticulitis, which she did not have.  2.  Low BP at times.  Okay to cut lisinopril  in half and take 20 mg in the morning and take Metoprolol  in evening for now.  As BP rises, will see if needs to go back to 40 mg Lisinopril  When sitting, elevate and support legs.  3.  CP:  she is to lie down for an hour if needs NTG in future.    4.  Fatigue with showers:  avoid hot showers--just warm for now.  5.  Blurry vision:   to call Dr. Milford office tomorrow.

## 2024-08-11 NOTE — Progress Notes (Unsigned)
 Subjective:    Patient ID: Haley [name], female    DOB: 05-Sep-1956, 68 y.o.   MRN: [id number]   CC- hospital follow-up  HPI-       Review of Systems  Constitutional:  Positive for activity change (decreased due to fatigue), fatigue (pt has chronic fatigue but it has increased since discharge. Usually could take a bath and make coffee without fatigue but now she gets tired. Can go to the abthroom OK) and unexpected weight change (intentional wt loss; thinks since she has lost weight, lisinopril  should be decreased).  HENT:  Positive for facial swelling (sustained left facial swellign from fall; has ressolved since hsopitalization).   Eyes:        See HPI  Respiratory:         No acute concerns. No leg swelling  Cardiovascular:  Positive for chest pain (chest pain occurign 3-4 times lasting a few seconds; took NTG once  for it.). Negative for leg swelling.  Gastrointestinal:  Positive for nausea (some nausea lating 45 mintues  yesterday; ressolved; eatin gOK since.).       H/o contispation chronically; was started on colace and myralax; has been soft and greenish black       Objective:    Vitals:   08/11/24 1559  BP: 132/86  Pulse: 83  Resp: 18   Physical Exam Vitals and nursing note reviewed.  Constitutional:      Appearance: Normal appearance.     Comments: Over-weight  HENT:     Head: Atraumatic.     Comments: No facial swelling ; can open mouth without difficulty    Ears:     Comments: Can hear communication without difficulty Eyes:     General:        Right eye: No discharge.        Left eye: No discharge.     Extraocular Movements: Extraocular movements intact.     Pupils: Pupils are equal, round, and reactive to light.     Comments: Pale conjunctivae B. No photophobia.  ?cataracts rightt eye.  Right eye : fingers held at 3 feet can be seen but is blurry.   Left eye: fingers held at 3 feet is clear.  Cardiovascular:     Rate and Rhythm: Normal rate and  regular rhythm.     Comments: No carotid bruits; could not hear right carotid as well; examiner limited in evaluation skill of carotid  Pulmonary:     Effort: Pulmonary effort is normal.  Abdominal:     Palpations: Abdomen is soft.     Tenderness: There is no abdominal tenderness.  Skin:    Findings: No bruising (hands, legs, face).  Neurological:     General: No focal deficit present.     Mental Status: She is alert and oriented to person, place, and time.     Cranial Nerves: No cranial nerve deficit.     Sensory: No sensory deficit.     Coordination: Coordination normal.     Gait: Gait normal.     Deep Tendon Reflexes: Reflexes abnormal (could not elicit any knee reflexes.).     Comments: The right hand turned in slightly after 30 seconds when doing the pronator drift;  Maybe very slight right arm drift.   Heel-shin smooth for pt's age. Negative rhomberg. Normal gait.   Psychiatric:        Mood and Affect: Mood normal.        Judgment: Judgment normal.  Assessment & Plan:   S/P hospital discharge on  9/  1- Hypotension- have been 100's/40's-50's past 2 days. Normal in clinic.  Normal before that.  No associated symptoms.   - lie down when you take the NTG PRN chest pain.   Took it one time ; it took seconds for pain to go away  - cut lisinopril  in 1/2 and once Hgb more acceptable, will increase lisinopril  back up - BP device from home has already been compared to clinic BP device - There is a question of taking 2 metoprolol  by daughter.  Investigate this to make sure it is one. If so , cut to one and do not change hgb 1  2- subacute right eye blurriness started during recent hospitalization- Pt to call and F/U with Dr. Jens asap  3- h/o GIB - most likely diverticular bleed per GI.  Black stools that is greenish; most likely due to Fe.  -Continue Fe and B12.    -Colace and myralax.  Increase fiber at home: Dietary counciling by Dr. Adella.   Nutrition referral  at pt request.  -Last Hgb      . Will get weekly Hgb until Hgb is stable.  -Dr. Adella Discussed the risks and benefits of no transfusion in case Hgb falls dangerously low.  Pt still adamant that no blood product is needed.   If further GI bleed, go to ER/ - f/u with GI on 09/2024 - Continue to hold ASA  4 - Chest pain- atypical, lasting a few seconds at baseline; controled with NTG

## 2024-08-11 NOTE — Progress Notes (Unsigned)
 Cc- here for f/u because of hospitalizatiion for GIB  HPI- Chart reviewed prior to pt evaluation: Pt went to the hospital 07/25/2024 for black stools.   BP was normal and hgb 10.9.     She had a CT-angio: trace bleeding in the left sigmoid colon area and congested rectum with blood in the left colon.   Sigmoidoscopy showed diverticulosis.  Endoscopy showed errosive esophagitis.   Hospitalization was complcated by LOC and falling after getting up from bed with negative head CT and some swelling of the left side of the face.  Jahova's Witness, declingin   HPI- no bleedign since.  +chest discomfort on Aug 09, 2024. Midsternal , 4 sharp pains, lasting a few seconds. ? No SOB.     Each day I feel stronger. Not taking ibuprofent. Have not started on usual routines.   At [employer]ble to get up and bathe and make coffe now; tired her out. So she rested.   Physical therapy making her feel stronger.  Has chronic . Takes fe without juice of vitamines c/.  ROS-  Dizzy only when she turns around but not when she gets up.   When she stands up she does not get dizzy.   Blurry vision every since she was discahrged.  Room is not as sharp.   Blood sugars at home checks it in am and it is around upper (0's. Room is not as sharpt. Right eye.  No problems with it.  During hospitalization.  Did nto tell doctor about. Comes and goes  Opthomologist. Dr. Octavia. Saw him a few days before going to the hospitalization which was on the 07/25/2024.  Did not tell those guys. Eye was ok when she saw Dr. Octavia. Speech unchaged. Memory has been off since during hospitalization. Remembers breakfast.it . It is Monday, 2025. Oct. 6.  President is trump. Knows birthday.  1, 5, [birth year].   Recalled 1. 5. 8 in 2-3 mintues after distraction.   Legs - n LED edema +wekaness arms , dtarted ?SABRA Noticed it when she came home. Things feel heavier but not tdroppign thingss.  GU- no change in urination GI- no BM for 2 days. But they are soft now; still black ACE  inhibitor therapy was not prescribed due to .still black. Mild nasuea yesterday but none today.  Lasted 45 minutes.   Taking meds, eating ok.  No shakiness.  Neuro- Headache for months in different parts, left temple or back  or top of right hit.  No trauama. No dizziness. 3 months. Not getting worse.  2-3 times per day lasting and hour or so ; goes away on its own.  It is sitting when it happens.    Consit- not fevers.  {MEDS ACE REASONS NOT PRESCRIBED PQRI [id number].   SoHx-  I do take blood even close to death.

## 2024-08-12 ENCOUNTER — Encounter: Payer: Self-pay | Admitting: Internal Medicine

## 2024-08-12 NOTE — Progress Notes (Signed)
 Subjective:    Patient ID: Haley [name], female    DOB: [birth year], 68 y.o.   MRN: [id number]  I, MD in training, did initial H/P, then presented to Dr. Adella who followed with her own eval, final A/P, pt counciling and education and verbal/written discharge instructions. I was in the room during Dr. Duanne eval and management. This is a summary of both evaluations.   Pt's daughter in the room during this visit.   CC_ Hospital f/u  HPI- HPI obtained from patient, daughter, and records available on EPIC, Pt is about 11 days s/p discharge from hospital for lower GI bleed.      She went to ED about 2.5 weeks ago for 3 days of dark stools with 1 episode of dizziness and syncope, cp, doe on the day of ED visit.   Her BP was 89/65 and her hgb had dropped from 13 to 10.9. She was given IVF and BP stabilized to 130/70.   Also her Cr had gone up a bit to 1.3..  INR normal. LFT's normal.  She was put in the hospital: Had a CT abd/pelvis with angio that showed hypervascularity of rectum and trace active bleeding.    As a result, GI did flex sig which showed diverticular disease of the left colon with maroon- colored old blood in the left colon.  During hospitalization, her hgb fell to around 4 and stayed there.  Pt refused blood transfusion due to being Jehova's witness with a strong belief against receiving blood products.  Did not tolerate IV Fe; had hypersensitivity reaction.  Hospital course was complicated by an episode of getting up from bed, getting dizzy and losing consciousness, injuring her left side of her face.  Head CT neg. on 07/28/24. She also says that her right eye has been intermittenly blurry; this started in the hospital.   Saw Dr. Octavia 2-3 days prior to her hospitalization and says she was told all was ok.     She was discharged 07/31/24 with Hgb around 4 on Fe 325 bid and B12 1000 every day, Physical Therapy for deconditioning.  Her Cr had gone to normal at discharge. Since  discharge 11 days ago, at home, pt has been experiencing her usual chronic fatigue but it has been slightly worse. Before hospitalization she could make coffee and shower without fatigue.  But now she is fatigued during these activities.  Has been working with PT which she says has been helping her.  Continues to have right eye blurriness.  Sharp cp lasting 2-3 seconds 5-6 times, used NTG once which helped.   Denies dizzines and syncope.   Also reports nonspecific headaches in different parts of the head at different times,sometimes after NTG. Her face is better without sxs.  Stools are still black but greensih black and not maroon.    ROS- Constituational- appetite is great.  HEENT- face is better CV- h/o hypertension on metoprolol  and lisinopril ; daughter might be giving her 2 times the amount of metoprolol .  BP logs for past 4 days shows 120-130/70's  and last 2 days 100's/40's without sxs.  BP machine at home has been compared to clinic machine before. Resp- No sob that is new MS- no leg swelling GU- denies urinary changes. Neuro- no dizziness at home, no weakness or numbness, no issues with speech.   SoHx- Lives with daughter  Patient Active Problem List that are pertinant:  Constipation        Date Noted: 05/19/2024 Diverticulosis -  though out the colon per colonscopy 4 years ago        Date Noted: 01/07/2024 Diabetes mellitus without complication (HCC)        Date Noted: 03/03/2020  Coronary artery disease involving native coronary artery of native heart without angina pectoris  Abnormal screening cardiac CT        Date Noted: 10/16/2016   Chest pain        Date Noted: 08/03/2016 GERD (gastroesophageal reflux disease)        Date Noted: 02/27/2016  Palpitation        Date Noted: 03/19/2014  Dyspnea on exertion        Date Noted: 03/19/2014  Fatigue        Date Noted: 03/19/2014  Essential hypertension        Date Noted: 07/22/2013  DM type 2, controlled, with complication  [facility])        Date Noted: 07/22/2013  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  Performed for               prolapsed uterus. 10/23/2016: CARDIAC CATHETERIZATION; N/A     Comment:  Procedure: Left Heart Cath and Coronary Angiography;                Surgeon: Lonni JONETTA Cash, MD;  Location: [facility]               INVASIVE CV LAB;  Service: Cardiovascular;  Laterality:               N/A; 2005-at age 42: COLONOSCOPY     Comment:  Hyperplastic polyp--[place] 07/27/2024: FLEXIBLE SIGMOIDOSCOPY; N/A     Comment:  Procedure: SIGMOIDOSCOPY, FLEXIBLE;  Surgeon:               Wilhelmenia Aloha Raddle., MD;  Location: [facility] ENDOSCOPY;                Service: Gastroenterology;  Laterality: N/A; 05/04/2020: LEFT HEART CATH AND CORONARY ANGIOGRAPHY; N/A     Comment:  Procedure: LEFT HEART CATH AND CORONARY ANGIOGRAPHY;                Surgeon: [place], Peter M, MD;  Location: [facility] INVASIVE CV               LAB;  Service: Cardiovascular;  Laterality: N/A;     Objective:     Vitals:   08/11/24 1559  BP: 132/86  Pulse: 83  Resp: 18    Physical Exam Vitals and nursing note reviewed.  Constitutional:      General: She is not in acute distress.    Appearance: Normal appearance. She is not ill-appearing, toxic-appearing or diaphoretic.  HENT:     Head: Normocephalic and atraumatic.     Comments: No facial swelling and bruises    Nose:     Comments: No trauma Eyes:     Extraocular Movements: Extraocular movements intact.     Conjunctiva/sclera: Conjunctivae normal.     Pupils: Pupils are equal, round, and reactive to light.     Comments: Right eye : blurrienss when 2 fingers are held 3 feet away. Can see 2 fingers.  ? Catracts? Pupil looks whitish Left eye - No blurriness. Can see 2 fingers at 3 feet  Pulmonary:     Effort: Pulmonary effort is  normal.   Abdominal:     General: Abdomen is flat.  Musculoskeletal:        General: No swelling or tenderness.  Skin:    Findings: No bruising or erythema.  Neurological:     General: No focal deficit present.     Mental Status: She is alert. She is disoriented.     Motor: Weakness (left leg 4/5 and right leg 5/5 distaally; h/o cva) present.     Gait: Gait normal.     Comments: Negative rhomberg. ?right hand drifted slightly after 30 seconds. Heel-shin symmetrical enough and smooth enough for pt's age and multiple medical conditions.   Psychiatric:        Mood and Affect: Mood normal.        Behavior: Behavior normal.        Judgment: Judgment normal.     Comments: Good reasoning, speech. Can repeat no ands ifs or buts . Can say 3 numbers back, can recall it immediately and after about 3 minutes.        Assessment & Plan:  1- Recent lower GIB, h/o Diverticulosis- s/p recent hospitalization w severe drop in Hgb to 4's and hypotension on ED presentation- refusing blood transfusion because she is Jehovah's witness-  Last Hgb 7.5 on 10/3 - Weekly Hgb until it is felt that pt is stable.  Visit with RN scheduled 08/22/24.  -Discussed risks of refusing blood transfusion including death- Mrs. Bennick, accompanied by her daughter, understands and still prefers no blood transfusion. -BP in the 100's /40-50's last 2 days at home but normal in clinic today.  No sx's.   Pt advised to not get up too quickly to avoid passing out. - Pt advised to go to ER right away if further GIB - Current black stools are greenish black and is probably from Iron  supplement - GI appt in 11/25. - Keep an eye on BP at home 2- episodes of low BP- Closely monitor due to recent GI bleed.  Also has HTN on lisinopril  and metoprolol - Concern that her daughter might be giving twice the amount. Her daughter to make sure she is not giving twice the amount. If not, cut lisinopril  dose in half and take Metoprolol  at night.  Will  increase Lisinopril  to 40mg  again after pt is stable.  Pt educated on use of NTG. Pt to lie down and elevate legs.  3-right eye blurred vision- Pt advised to see her optholmologist soon this week. 4- atypical chest pain- h/o CAD and possible demand ischemia- pt instructed by Dr.Mulberry regarding proper use of NTG. She is asked to sit down or lie down after using NTG as it might drop her BP.  Pt conveyed understanding.  5-Chronic fatigue worse after hospital discharge- Avoid hot baths.  Continue Physical Therapy. RTC prn and for scheduled blood draws. To ER PRN.

## 2024-08-20 ENCOUNTER — Ambulatory Visit: Admitting: Neurology

## 2024-08-22 ENCOUNTER — Other Ambulatory Visit (INDEPENDENT_AMBULATORY_CARE_PROVIDER_SITE_OTHER)

## 2024-08-22 DIAGNOSIS — D509 Iron deficiency anemia, unspecified: Secondary | ICD-10-CM | POA: Diagnosis not present

## 2024-08-23 LAB — CBC WITH DIFFERENTIAL/PLATELET
Basophils Absolute: 0.1 x10E3/uL (ref 0.0–0.2)
Basos: 1 %
EOS (ABSOLUTE): 0.2 x10E3/uL (ref 0.0–0.4)
Eos: 5 %
Hematocrit: 32.4 % — ABNORMAL LOW (ref 34.0–46.6)
Hemoglobin: 9.9 g/dL — ABNORMAL LOW (ref 11.1–15.9)
Immature Grans (Abs): 0 x10E3/uL (ref 0.0–0.1)
Immature Granulocytes: 0 %
Lymphocytes Absolute: 1.5 x10E3/uL (ref 0.7–3.1)
Lymphs: 38 %
MCH: 32.2 pg (ref 26.6–33.0)
MCHC: 30.6 g/dL — ABNORMAL LOW (ref 31.5–35.7)
MCV: 106 fL — ABNORMAL HIGH (ref 79–97)
Monocytes Absolute: 0.5 x10E3/uL (ref 0.1–0.9)
Monocytes: 12 %
Neutrophils Absolute: 1.7 x10E3/uL (ref 1.4–7.0)
Neutrophils: 44 %
Platelets: 326 x10E3/uL (ref 150–450)
RBC: 3.07 x10E6/uL — ABNORMAL LOW (ref 3.77–5.28)
RDW: 13.3 % (ref 11.7–15.4)
WBC: 3.9 x10E3/uL (ref 3.4–10.8)

## 2024-08-25 ENCOUNTER — Telehealth: Payer: Self-pay | Admitting: Internal Medicine

## 2024-08-25 ENCOUNTER — Ambulatory Visit: Payer: Self-pay | Admitting: Internal Medicine

## 2024-08-25 NOTE — Telephone Encounter (Signed)
 Patient called today and states she just spoke with the doctor and was told about oxygen caring alternatives.  Patient states she would like to know the different names of them as she would like to search them and see what are the differences between them.  Patient states she knows one of them the name is [employer], but states she was told by the doctor of another one that doctor suggested her to accept.

## 2024-08-25 NOTE — Progress Notes (Unsigned)
 Spoke with Rank[place], ICU pharmacist regarding oxygen carrier alternatives. He will discuss possible options with pharmacy and possibly blood bank and get back with me.   Called and spoke with patient regarding Oxygen carrier alternatives--discussed bovine, porcine, human hemoglobin polymerization. She will check with her church as to whether these are considered acceptable

## 2024-08-25 NOTE — Telephone Encounter (Signed)
 Patient has been notified, patient has no other questions at this time.

## 2024-08-25 NOTE — Telephone Encounter (Signed)
 Received a call form [name] from [facility] . PT states she needs a Verbal approval to discharge patient from home health PT, as patient has met all goals.   Phone number to call back is [phone] Name of PT is Almira, she states this is a direct line where can leave a voicemail.

## 2024-08-25 NOTE — Telephone Encounter (Signed)
 Per CMA- Abdul called physical therapist to the number provided, but call did not go through.

## 2024-08-29 ENCOUNTER — Other Ambulatory Visit

## 2024-09-03 ENCOUNTER — Ambulatory Visit: Payer: Self-pay | Admitting: Gastroenterology

## 2024-09-05 ENCOUNTER — Other Ambulatory Visit (INDEPENDENT_AMBULATORY_CARE_PROVIDER_SITE_OTHER)

## 2024-09-05 DIAGNOSIS — D509 Iron deficiency anemia, unspecified: Secondary | ICD-10-CM

## 2024-09-06 ENCOUNTER — Ambulatory Visit: Payer: Self-pay | Admitting: Internal Medicine

## 2024-09-06 LAB — CBC WITH DIFFERENTIAL/PLATELET
Basophils Absolute: 0.1 x10E3/uL (ref 0.0–0.2)
Basos: 2 %
EOS (ABSOLUTE): 0.1 x10E3/uL (ref 0.0–0.4)
Eos: 4 %
Hematocrit: 36.8 % (ref 34.0–46.6)
Hemoglobin: 11.4 g/dL (ref 11.1–15.9)
Immature Grans (Abs): 0 x10E3/uL (ref 0.0–0.1)
Immature Granulocytes: 0 %
Lymphocytes Absolute: 1.4 x10E3/uL (ref 0.7–3.1)
Lymphs: 37 %
MCH: 31.6 pg (ref 26.6–33.0)
MCHC: 31 g/dL — ABNORMAL LOW (ref 31.5–35.7)
MCV: 102 fL — ABNORMAL HIGH (ref 79–97)
Monocytes Absolute: 0.5 x10E3/uL (ref 0.1–0.9)
Monocytes: 13 %
Neutrophils Absolute: 1.7 x10E3/uL (ref 1.4–7.0)
Neutrophils: 44 %
Platelets: 295 x10E3/uL (ref 150–450)
RBC: 3.61 x10E6/uL — ABNORMAL LOW (ref 3.77–5.28)
RDW: 12.1 % (ref 11.7–15.4)
WBC: 3.8 x10E3/uL (ref 3.4–10.8)

## 2024-09-07 NOTE — Progress Notes (Unsigned)
 [facility]  PATIENT: Haley [name] DOB: 1956-06-27  REFERRING DOCTOR OR PCP:  *** SOURCE: ***  _________________________________   HISTORICAL  CHIEF COMPLAINT:  No chief complaint on file.   HISTORY OF PRESENT ILLNESS:  ***    Hospitalized in September 2025 with a lower GI bleed.  She was refusing blood transfusions (Jehovah's Witness)  Imaging CT scan of the head 07/28/2024 showed the brain was normal for age.  There is some soft tissue swelling under the orbit.  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  This was up to 7.5 on 08/08/2024 and back in the normal range on 09/05/2024 (11.4)  REVIEW OF SYSTEMS: Constitutional: No fevers, chills, sweats, or change in appetite Eyes: No visual changes, double vision, eye pain Ear, nose and throat: No hearing loss, ear pain, nasal congestion, sore throat Cardiovascular: No chest pain, palpitations Respiratory:  No shortness of breath at rest or with exertion.   No wheezes GastrointestinaI: No nausea, vomiting, diarrhea, abdominal pain, fecal incontinence Genitourinary:  No dysuria, urinary retention or frequency.  No nocturia. Musculoskeletal:  No neck pain, back pain Integumentary: No rash, pruritus, skin lesions Neurological: as above Psychiatric: No depression at this time.  No anxiety Endocrine: No palpitations, diaphoresis, change in appetite, change in weigh or increased thirst Hematologic/Lymphatic:  No anemia, purpura, petechiae. Allergic/Immunologic: No itchy/runny eyes, nasal congestion, recent allergic reactions, rashes  ALLERGIES: Allergies  Allergen Reactions   Iron  Sucrose Shortness Of Breath, Other (See Comments) and Hypertension    IV IRON  INFUSION: Patient with chest tightness and heaviness, difficulty breathing, left arm heaviness and numbness, SBP increases 30-50 points quickly with impending  doom. Symptoms resolve when BP comes down. Attempted multiple rates (132.5, 66, 20 mL/hr) with same symptoms following.    Praluent  [Alirocumab ]     Had significant fatigue and memory loss--was lost driving in town.  Joint pain and nausea as well.   Zetia [Ezetimibe] Other (See Comments)    Joint pain and ear ringing   Atorvastatin  Other (See Comments)    Pt states causes bilateral lower extremity muscle cramps   Pravastatin  Nausea And Vomiting and Other (See Comments)    Causes dizziness   Repatha  [Evolocumab ] Other (See Comments)    cramps   Rosuvastatin  Other (See Comments)    Leg cramping on 5-10mg  daily    HOME MEDICATIONS:  Current Outpatient Medications:    ACCU-CHEK GUIDE TEST test strip, SMARTSIG:Strip(s), Disp: , Rfl:    Alcohol Swabs (PHARMACIST CHOICE ALCOHOL) PADS, , Disp: , Rfl:    [Paused] aspirin  81 MG tablet, Take 81 mg by mouth daily., Disp: , Rfl:    Blood Glucose Calibration (ACCU-CHEK GUIDE CONTROL) LIQD, , Disp: , Rfl:    Calcium  Citrate 250 MG TABS, 2 tabs by mouth twice daily., Disp: , Rfl: 0   Cholecalciferol (DIALYVITE VITAMIN D  5000) 125 MCG (5000 UT) capsule, Take 15,000 Units by mouth daily. (Patient not taking: Reported on 08/11/2024), Disp: , Rfl:    Cyanocobalamin  (B-12 PO), Take 1 tablet by mouth daily., Disp: , Rfl:    docusate sodium  (COLACE) 100 MG capsule, Take 1 capsule (100 mg total) by mouth 2 (two) times daily., Disp: 180 capsule, Rfl: 0   ferrous sulfate  325 (65 FE) MG tablet, Take 1 tablet (325 mg total) by mouth 2 (two) times daily with a meal., Disp: 180 tablet, Rfl: 0   FLAXSEED, LINSEED, PO, Take 1 Dose  by mouth in the morning. Crushed flaxseed added to yogurt at breakfast. (Patient not taking: Reported on 08/11/2024), Disp: , Rfl:    Liniments (BLUE-EMU SUPER STRENGTH EX), Apply 1 application topically daily as needed (pain)., Disp: , Rfl:    lisinopril  (ZESTRIL ) 40 MG tablet, Take 1 tablet (40 mg total) by mouth daily., Disp: 90 tablet, Rfl:  2   metoprolol  succinate (TOPROL -XL) 50 MG 24 hr tablet, Take 1 tablet (50 mg total) by mouth as needed (PALPITATIONS). Take with or immediately following a meal., Disp: 90 tablet, Rfl: 1   mometasone  (NASONEX ) 50 MCG/ACT nasal spray, USE 2 SPRAY(S) IN EACH NOSTRIL ONCE DAILY (Patient taking differently: Place 2 sprays into the nose daily as needed (allergies). USE 2 SPRAY(S) IN EACH NOSTRIL ONCE DAILY), Disp: 51 g, Rfl: 3   Multiple Vitamins-Minerals (MULTIVITAMIN WITH MINERALS) tablet, Take 1 tablet by mouth daily., Disp: , Rfl:    nitroGLYCERIN  (NITROSTAT ) 0.4 MG SL tablet, DISSOLVE ONE TABLET UNDER THE TONGUE EVERY 5 MINUTES AS NEEDED FOR CHEST PAIN.  DO NOT EXCEED A TOTAL OF 3 DOSES IN 15 MINUTES, Disp: 25 tablet, Rfl: 1   omega-3 acid ethyl esters (LOVAZA ) 1 g capsule, Take 1 capsule (1 g total) by mouth 2 (two) times daily. (Patient not taking: Reported on 08/11/2024), Disp: 180 capsule, Rfl: 3   pantoprazole  (PROTONIX ) 40 MG tablet, Take 1 tablet (40 mg total) by mouth 2 (two) times daily before a meal., Disp: 180 tablet, Rfl: 3   Pharmacist Choice Lancets MISC, , Disp: , Rfl:    polyethylene glycol (MIRALAX  / GLYCOLAX ) 17 g packet, Take 17 g by mouth daily as needed., Disp: 14 each, Rfl: 3   tirzepatide  (MOUNJARO ) 15 MG/0.5ML Pen, Inject 15 mg into the skin once a week., Disp: 2 mL, Rfl: 11  PAST MEDICAL HISTORY: Past Medical History:  Diagnosis Date   Back pain    Coronary artery disease    Depression    DM type 2, controlled, with complication (HCC) 07/22/2013   Type II DM diagnosed in ~ 2006 per pt hx.    Environmental and seasonal allergies    Ganglion cyst of wrist, right 05/09/2017   Hyperlipidemia    Hypertension    Itching    Refusal of blood transfusions as patient is Jehovah's Witness    Shingles    Stroke (HCC) 2000   per pt. report:   TIA    PAST SURGICAL HISTORY: Past Surgical History:  Procedure Laterality Date   ABDOMINAL HYSTERECTOMY  1994   Laparoscopic;  ovaries still intact.  Performed for prolapsed uterus.   AREOLA/NIPPLE RECONSTRUCTION WITH GRAFT Bilateral 09/19/2021   Procedure: FREE NIPPLE GRAFT;  Surgeon: Elisabeth Craig RAMAN, MD;  Location: [facility] OR;  Service: Plastics;  Laterality: Bilateral;   BREAST REDUCTION SURGERY Bilateral 09/19/2021   Procedure: Bilateral Breast Reduction;  Surgeon: Elisabeth Craig RAMAN, MD;  Location: [facility] OR;  Service: Plastics;  Laterality: Bilateral;  2 hours   CARDIAC CATHETERIZATION N/A 10/23/2016   Procedure: Left Heart Cath and Coronary Angiography;  Surgeon: Lonni JONETTA Cash, MD;  Location: [facility] INVASIVE CV LAB;  Service: Cardiovascular;  Laterality: N/A;   CHOLECYSTECTOMY  1997   Laparoscopic   COLONOSCOPY  2005-at age 40   Hyperplastic polyp--[place]   FLEXIBLE SIGMOIDOSCOPY N/A 07/27/2024   Procedure: KINGSTON SIDE;  Surgeon: Wilhelmenia Aloha Raddle., MD;  Location: [facility] ENDOSCOPY;  Service: Gastroenterology;  Laterality: N/A;   LEFT HEART CATH AND CORONARY ANGIOGRAPHY N/A 05/04/2020   Procedure:  LEFT HEART CATH AND CORONARY ANGIOGRAPHY;  Surgeon: Jordan, Peter M, MD;  Location: [facility] INVASIVE CV LAB;  Service: Cardiovascular;  Laterality: N/A;   WISDOM TOOTH EXTRACTION      FAMILY HISTORY: Family History  Problem Relation Age of Onset   Diabetes Father    Diabetes Sister        prediabetic   Cancer Sister 49       uterine cancer   Hypertension Brother    Diabetes Brother    Seizures Brother    Stroke Maternal Aunt        Multiple aunts died from strokes-maternal   Hypertension Son    Diabetes Son    Colon cancer Neg Hx    Esophageal cancer Neg Hx    Pancreatic cancer Neg Hx    Stomach cancer Neg Hx     SOCIAL HISTORY: Social History   Socioeconomic History   Marital status: Single    Spouse name: Not on file   Number of children: 2   Years of education: 12+   Highest education level: Some college, no degree  Occupational History   Occupation: Print Production Planner    Comment: Retired  now  Tobacco Use   Smoking status: Never    Passive exposure: Never   Smokeless tobacco: Never  Vaping Use   Vaping status: Never Used  Substance and Sexual Activity   Alcohol use: Not Currently   Drug use: No   Sexual activity: Not Currently    Birth control/protection: Surgical  Other Topics Concern   Not on file  Social History Narrative   Did get some post high school training/education   Lives alone   [place] 2 great grandchildren frequently   Daughter lives in [place] and checks in regularly.   Social Drivers of Corporate Investment Banker Strain: Low Risk  (04/18/2023)   Overall Financial Resource Strain (CARDIA)    Difficulty of Paying Living Expenses: Not hard at all  Food Insecurity: No Food Insecurity (07/25/2024)   Hunger Vital Sign    Worried About Running Out of Food in the Last Year: Never true    Ran Out of Food in the Last Year: Never true  Transportation Needs: No Transportation Needs (07/25/2024)   PRAPARE - Administrator, Civil Service (Medical): No    Lack of Transportation (Non-Medical): No  Physical Activity: Unknown (08/15/2021)   Received from [facility]   Exercise Vital Sign    On average, how many days per week do you engage in moderate to strenuous exercise (like a brisk walk)?: Patient declined    Minutes of Exercise per Session: Not on file  Stress: Unknown (08/15/2021)   Received from [facility] - Occupational Stress Questionnaire    Feeling of Stress : Patient declined  Social Connections: Moderately Isolated (07/25/2024)   Social Connection and Isolation Panel    Frequency of Communication with Friends and Family: More than three times a week    Frequency of Social Gatherings with Friends and Family: More than three times a week    Attends Religious Services: 1 to 4 times per year    Active Member of [employer] or Organizations: No    Attends Banker Meetings: Never     Marital Status: Divorced  Catering Manager Violence: Not At Risk (07/25/2024)   Humiliation, Afraid, Rape, and Kick questionnaire    Fear of Current or Ex-Partner: No    Emotionally Abused:  No    Physically Abused: No    Sexually Abused: No       PHYSICAL EXAM  There were no vitals filed for this visit.  There is no height or weight on file to calculate BMI.   General: The patient is well-developed and well-nourished and in no acute distress  HEENT:  Head is Bell/AT.  Sclera are anicteric.  Funduscopic exam shows normal optic discs and retinal vessels.  Neck: No carotid bruits are noted.  The neck is nontender.  Cardiovascular: The heart has a regular rate and rhythm with a normal S1 and S2. There were no murmurs, gallops or rubs.    Skin: Extremities are without rash or  edema.  Musculoskeletal:  Back is nontender  Neurologic Exam  Mental status: The patient is alert and oriented x 3 at the time of the examination. The patient has apparent normal recent and remote memory, with an apparently normal attention span and concentration ability.   Speech is normal.  Cranial nerves: Extraocular movements are full. Pupils are equal, round, and reactive to light and accomodation.  Visual fields are full.  Facial symmetry is present. There is good facial sensation to soft touch bilaterally.Facial strength is normal.  Trapezius and sternocleidomastoid strength is normal. No dysarthria is noted.  The tongue is midline, and the patient has symmetric elevation of the soft palate. No obvious hearing deficits are noted.  Motor:  Muscle bulk is normal.   Tone is normal. Strength is  5 / 5 in all 4 extremities.   Sensory: Sensory testing is intact to pinprick, soft touch and vibration sensation in all 4 extremities.  Coordination: Cerebellar testing reveals good finger-nose-finger and heel-to-shin bilaterally.  Gait and station: Station is normal.   Gait is normal. Tandem gait is normal.  Romberg is negative.   Reflexes: Deep tendon reflexes are symmetric and normal bilaterally.   Plantar responses are flexor.    DIAGNOSTIC DATA (LABS, IMAGING, TESTING) - I reviewed patient records, labs, notes, testing and imaging myself where available.  Lab Results  Component Value Date   WBC 3.8 09/05/2024   HGB 11.4 09/05/2024   HCT 36.8 09/05/2024   MCV 102 (H) 09/05/2024   PLT 295 09/05/2024      Component Value Date/Time   NA 141 07/27/2024 0508   NA 142 03/20/2024 1527   K 3.8 07/27/2024 0508   CL 111 07/27/2024 0508   CO2 20 (L) 07/27/2024 0508   GLUCOSE 101 (H) 07/27/2024 0508   BUN 11 07/27/2024 0508   BUN 15 03/20/2024 1527   CREATININE 0.98 07/27/2024 0508   CREATININE 1.17 (H) 10/18/2016 1527   CALCIUM  7.9 (L) 07/27/2024 0508   PROT 6.7 07/25/2024 1345   PROT 6.6 03/20/2024 1527   ALBUMIN 3.9 07/25/2024 1345   ALBUMIN 4.0 03/20/2024 1527   AST 19 07/25/2024 1345   ALT 23 07/25/2024 1345   ALKPHOS 53 07/25/2024 1345   BILITOT 0.4 07/25/2024 1345   BILITOT 0.2 03/20/2024 1527   GFRNONAA >60 07/27/2024 0508   GFRNONAA 71 03/02/2015 1359   GFRAA 54 (L) 04/22/2020 0922   GFRAA 82 03/02/2015 1359   Lab Results  Component Value Date   CHOL 189 11/16/2023   HDL 52 11/16/2023   LDLCALC 127 (H) 11/16/2023   LDLDIRECT 134 (H) 07/22/2013   TRIG 50 11/16/2023   CHOLHDL 3.6 11/16/2023   Lab Results  Component Value Date   HGBA1C 5.6 10/17/2023   No results found for: [id number]  Lab Results  Component Value Date   TSH 2.230 04/07/2024       ASSESSMENT AND PLAN  ***   Cecilio Ohlrich A. Vear, MD, Teola RENO 09/07/2024, 2:08 PM Certified in Neurology, Clinical Neurophysiology, Sleep Medicine and Neuroimaging  [facility] [address] [place], [place] [address] [phone]

## 2024-09-08 ENCOUNTER — Encounter: Payer: Self-pay | Admitting: Neurology

## 2024-09-08 ENCOUNTER — Ambulatory Visit (INDEPENDENT_AMBULATORY_CARE_PROVIDER_SITE_OTHER): Admitting: Neurology

## 2024-09-08 VITALS — BP 165/90 | HR 76 | Ht 61.0 in | Wt 169.5 lb

## 2024-09-08 DIAGNOSIS — E538 Deficiency of other specified B group vitamins: Secondary | ICD-10-CM

## 2024-09-08 DIAGNOSIS — G3184 Mild cognitive impairment, so stated: Secondary | ICD-10-CM

## 2024-09-08 DIAGNOSIS — K5791 Diverticulosis of intestine, part unspecified, without perforation or abscess with bleeding: Secondary | ICD-10-CM | POA: Diagnosis not present

## 2024-09-08 DIAGNOSIS — E118 Type 2 diabetes mellitus with unspecified complications: Secondary | ICD-10-CM | POA: Diagnosis not present

## 2024-09-08 DIAGNOSIS — Z7985 Long-term (current) use of injectable non-insulin antidiabetic drugs: Secondary | ICD-10-CM

## 2024-09-08 DIAGNOSIS — K922 Gastrointestinal hemorrhage, unspecified: Secondary | ICD-10-CM

## 2024-09-09 ENCOUNTER — Ambulatory Visit: Payer: Self-pay | Admitting: Neurology

## 2024-09-09 LAB — VITAMIN B12: Vitamin B-12: >2000 pg/mL — ABNORMAL HIGH (ref 232–1245)

## 2024-09-17 ENCOUNTER — Encounter: Payer: Self-pay | Admitting: Gastroenterology

## 2024-09-17 ENCOUNTER — Ambulatory Visit (INDEPENDENT_AMBULATORY_CARE_PROVIDER_SITE_OTHER): Admitting: Gastroenterology

## 2024-09-17 VITALS — BP 150/100 | HR 80 | Ht 61.0 in | Wt 171.0 lb

## 2024-09-17 DIAGNOSIS — K219 Gastro-esophageal reflux disease without esophagitis: Secondary | ICD-10-CM

## 2024-09-17 NOTE — Patient Instructions (Addendum)
 We have sent the following medications to your pharmacy for you to pick up at your convenience: Pantoprazole    VISIT SUMMARY:  You came in for a follow-up visit to manage your acid reflux and hiatal hernia. Overall, you are feeling well with no current symptoms of acid reflux or swallowing issues.  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 -Reduce pantoprazole  to 40 mg once daily for 1-2 months. If your symptoms are controlled, then reduce to 20 mg once daily. -Make lifestyle changes such as avoiding late meals, not lying down immediately after eating, and elevating the head of your bed by 4-6 inches. -Schedule a follow-up appointment in one year.  Dr Shila

## 2024-09-17 NOTE — Progress Notes (Signed)
 Haley [name]    [id number]    1956/01/03  Primary Care Physician:Mulberry, Almarie, MD  Referring Physician: Adella Almarie, MD [address] [place],  [place] [address]   Chief complaint:  GERD, hiatal hernia  Discussed the use of AI scribe software for clinical note transcription with the patient, who gave verbal consent to proceed.  History of Present Illness Haley [name] is a 68 year old female with a hiatal hernia who presents for follow-up of acid reflux management.  Gastroesophageal reflux symptoms - No current symptoms of acid reflux - No dysphagia - Feels well overall - No issues with swallowing or acid reflux during review of systems  Hiatal hernia and esophageal stricture - History of hiatal hernia - Prior esophageal narrowing treated with dilation  Acid suppression therapy - Currently taking pantoprazole  40 mg twice daily for acid reflux  Endoscopic evaluation - Endoscopy showed no infection - No evidence of precancerous changes   EGD 07/03/24 - Benign- appearing esophageal stenosis. Dilated. - Small hiatal hernia. - Normal stomach. - Normal examined duodenum. - Biopsies were taken with a cold forceps for evaluation of eosinophilic esophagitis.       1. Surgical [P], esophageal biopsy :       - ESOPHAGEAL SQUAMOUS MUCOSA WITH MILD VASCULAR CONGESTION, AND FOCAL SQUAMOUS       BALLOONING, SUGGESTIVE OF REFLUX ESOPHAGITIS       - NEGATIVE FOR INCREASED INTRAEPITHELIAL EOSINOPHILS    Outpatient Encounter Medications as of 09/17/2024  Medication Sig   ACCU-CHEK GUIDE TEST test strip SMARTSIG:Strip(s)   Alcohol Swabs (PHARMACIST CHOICE ALCOHOL) PADS    [Paused] aspirin  81 MG tablet Take 81 mg by mouth daily.   Blood Glucose Calibration (ACCU-CHEK GUIDE CONTROL) LIQD    Calcium  Citrate 250 MG TABS 2 tabs by mouth twice daily.   Cholecalciferol (DIALYVITE VITAMIN D  5000) 125 MCG (5000 UT) capsule Take 15,000 Units by mouth daily.    Coenzyme Q10 (CO Q 10 PO) Take 1 tablet by mouth daily.   Cyanocobalamin  (B-12 PO) Take 1 tablet by mouth daily.   docusate sodium  (COLACE) 100 MG capsule Take 1 capsule (100 mg total) by mouth 2 (two) times daily.   ferrous sulfate  325 (65 FE) MG tablet Take 1 tablet (325 mg total) by mouth 2 (two) times daily with a meal.   Liniments (BLUE-EMU SUPER STRENGTH EX) Apply 1 application topically daily as needed (pain).   lisinopril  (ZESTRIL ) 40 MG tablet Take 1 tablet (40 mg total) by mouth daily.   metoprolol  succinate (TOPROL -XL) 50 MG 24 hr tablet Take 1 tablet (50 mg total) by mouth as needed (PALPITATIONS). Take with or immediately following a meal.   mometasone  (NASONEX ) 50 MCG/ACT nasal spray USE 2 SPRAY(S) IN EACH NOSTRIL ONCE DAILY (Patient taking differently: Place 2 sprays into the nose daily as needed (allergies). USE 2 SPRAY(S) IN EACH NOSTRIL ONCE DAILY)   Multiple Vitamins-Minerals (MULTIVITAMIN WITH MINERALS) tablet Take 1 tablet by mouth daily.   nitroGLYCERIN  (NITROSTAT ) 0.4 MG SL tablet DISSOLVE ONE TABLET UNDER THE TONGUE EVERY 5 MINUTES AS NEEDED FOR CHEST PAIN.  DO NOT EXCEED A TOTAL OF 3 DOSES IN 15 MINUTES   omega-3 acid ethyl esters (LOVAZA ) 1 g capsule Take 1 capsule (1 g total) by mouth 2 (two) times daily.   pantoprazole  (PROTONIX ) 40 MG tablet Take 1 tablet (40 mg total) by mouth 2 (two) times daily before a meal.  Pharmacist Choice Lancets MISC    polyethylene glycol (MIRALAX  / GLYCOLAX ) 17 g packet Take 17 g by mouth daily as needed.   rosuvastatin  (CRESTOR ) 5 MG tablet Take 5 mg by mouth 2 (two) times a week.   tirzepatide  (MOUNJARO ) 15 MG/0.5ML Pen Inject 15 mg into the skin once a week.   No facility-administered encounter medications on file as of 09/17/2024.    Allergies as of 09/17/2024 - Review Complete 09/17/2024  Allergen Reaction Noted   Iron  sucrose Shortness Of Breath, Other (See Comments), and Hypertension 07/27/2024   Praluent  [alirocumab ]   03/05/2017   Zetia [ezetimibe] Other (See Comments) 12/12/2016   Metformin and related  09/17/2024   Atorvastatin  Other (See Comments) 08/16/2016   Pravastatin  Nausea And Vomiting and Other (See Comments) 09/18/2016   Repatha  [evolocumab ] Other (See Comments) 11/19/2023   Rosuvastatin  Other (See Comments) 04/20/2023    Past Medical History:  Diagnosis Date   Back pain    Coronary artery disease    Depression    DM type 2, controlled, with complication (HCC) 07/22/2013   Type II DM diagnosed in ~ 2006 per pt hx.    Environmental and seasonal allergies    Ganglion cyst of wrist, right 05/09/2017   Hyperlipidemia    Hypertension    Itching    Refusal of blood transfusions as patient is Jehovah's Witness    Shingles    Stroke (HCC) 2000   per pt. report:   TIA    Past Surgical History:  Procedure Laterality Date   ABDOMINAL HYSTERECTOMY  1994   Laparoscopic; ovaries still intact.  Performed for prolapsed uterus.   AREOLA/NIPPLE RECONSTRUCTION WITH GRAFT Bilateral 09/19/2021   Procedure: FREE NIPPLE GRAFT;  Surgeon: Elisabeth Craig RAMAN, MD;  Location: [facility] OR;  Service: Plastics;  Laterality: Bilateral;   BREAST REDUCTION SURGERY Bilateral 09/19/2021   Procedure: Bilateral Breast Reduction;  Surgeon: Elisabeth Craig RAMAN, MD;  Location: [facility] OR;  Service: Plastics;  Laterality: Bilateral;  2 hours   CARDIAC CATHETERIZATION N/A 10/23/2016   Procedure: Left Heart Cath and Coronary Angiography;  Surgeon: Lonni JONETTA Cash, MD;  Location: [facility] INVASIVE CV LAB;  Service: Cardiovascular;  Laterality: N/A;   CHOLECYSTECTOMY  1997   Laparoscopic   COLONOSCOPY  2005-at age 75   Hyperplastic polyp--[place]   FLEXIBLE SIGMOIDOSCOPY N/A 07/27/2024   Procedure: KINGSTON SIDE;  Surgeon: Wilhelmenia Aloha Raddle., MD;  Location: [facility] ENDOSCOPY;  Service: Gastroenterology;  Laterality: N/A;   LEFT HEART CATH AND CORONARY ANGIOGRAPHY N/A 05/04/2020   Procedure: LEFT HEART CATH AND CORONARY  ANGIOGRAPHY;  Surgeon: Jordan, Peter M, MD;  Location: [facility] INVASIVE CV LAB;  Service: Cardiovascular;  Laterality: N/A;   WISDOM TOOTH EXTRACTION      Family History  Problem Relation Age of Onset   Diabetes Father    Diabetes Sister        prediabetic   Cancer Sister 39       uterine cancer   Hypertension Brother    Diabetes Brother    Seizures Brother    Stroke Maternal Aunt        Multiple aunts died from strokes-maternal   Hypertension Son    Diabetes Son    Colon cancer Neg Hx    Esophageal cancer Neg Hx    Pancreatic cancer Neg Hx    Stomach cancer Neg Hx     Social History   Socioeconomic History   Marital status: Single    Spouse name: Not on file  Number of children: 2   Years of education: 12+   Highest education level: Some college, no degree  Occupational History   Occupation: Print Production Planner    Comment: Retired now  Tobacco Use   Smoking status: Never    Passive exposure: Never   Smokeless tobacco: Never  Vaping Use   Vaping status: Never Used  Substance and Sexual Activity   Alcohol use: Not Currently   Drug use: No   Sexual activity: Not Currently    Birth control/protection: Surgical  Other Topics Concern   Not on file  Social History Narrative   Did get some post high school training/education   Lives alone   [place] 2 great grandchildren frequently   Daughter lives in [place] and checks in regularly.   Social Drivers of Corporate Investment Banker Strain: Low Risk  (04/18/2023)   Overall Financial Resource Strain (CARDIA)    Difficulty of Paying Living Expenses: Not hard at all  Food Insecurity: No Food Insecurity (07/25/2024)   Hunger Vital Sign    Worried About Running Out of Food in the Last Year: Never true    Ran Out of Food in the Last Year: Never true  Transportation Needs: No Transportation Needs (07/25/2024)   PRAPARE - Administrator, Civil Service (Medical): No    Lack of Transportation (Non-Medical): No   Physical Activity: Unknown (08/15/2021)   Received from [facility]   Exercise Vital Sign    On average, how many days per week do you engage in moderate to strenuous exercise (like a brisk walk)?: Patient declined    Minutes of Exercise per Session: Not on file  Stress: Unknown (08/15/2021)   Received from [facility] - Occupational Stress Questionnaire    Feeling of Stress : Patient declined  Social Connections: Moderately Isolated (07/25/2024)   Social Connection and Isolation Panel    Frequency of Communication with Friends and Family: More than three times a week    Frequency of Social Gatherings with Friends and Family: More than three times a week    Attends Religious Services: 1 to 4 times per year    Active Member of [employer] or Organizations: No    Attends Banker Meetings: Never    Marital Status: Divorced  Catering Manager Violence: Not At Risk (07/25/2024)   Humiliation, Afraid, Rape, and Kick questionnaire    Fear of Current or Ex-Partner: No    Emotionally Abused: No    Physically Abused: No    Sexually Abused: No      Review of systems: All other review of systems negative except as mentioned in the HPI.   Physical Exam: Vitals:   09/17/24 1002  BP: (!) 150/100  Pulse: 80   Body mass index is 32.31 kg/m. Gen:      No acute distress HEENT:  sclera anicteric Ext:    No edema Neuro: alert and oriented x 3 Psych: normal mood and affect  Data Reviewed:  Reviewed labs, radiology imaging, old records and pertinent past GI work up   Assessment and Plan Assessment & Plan Gastroesophageal reflux disease due to hiatal hernia Chronic gastroesophageal reflux disease secondary to a small hiatal hernia, causing acid reflux due to incomplete closure of the lower esophageal sphincter. No significant esophageal damage or precancerous changes.  Reflux esophagitis and benign esophageal stricture s/p dilation.   Symptoms currently controlled on pantoprazole  40 mg twice daily - Decreased pantoprazole  to  40 mg once daily for 1-2 months, then reduce to 20 mg once daily if symptoms are controlled. - Advised lifestyle modifications: avoid late meals, do not lie down immediately after eating, and elevate head of bed by 4-6 inches. - Scheduled follow-up in one year.     This visit required 20 minutes of patient care (this includes precharting, chart review, review of results, face-to-face time used for counseling as well as treatment plan and follow-up. The patient was provided an opportunity to ask questions and all were answered. The patient agreed with the plan and demonstrated an understanding of the instructions.  Haley [name] , MD    CC: Haley Norris, MD

## 2024-09-23 ENCOUNTER — Encounter: Attending: Internal Medicine | Admitting: Dietician

## 2024-09-23 ENCOUNTER — Encounter: Payer: Self-pay | Admitting: Dietician

## 2024-09-23 DIAGNOSIS — E118 Type 2 diabetes mellitus with unspecified complications: Secondary | ICD-10-CM | POA: Insufficient documentation

## 2024-09-23 NOTE — Telephone Encounter (Signed)
 I called Mrs Bacorn and she said that PT has discharged her already

## 2024-09-23 NOTE — Progress Notes (Signed)
 Diabetes Self-Management Education  Visit Type: First/Initial  Appt. Start Time: 1415 Appt. End Time: 1520  09/23/2024  Haley [name], identified by name and date of birth, is a 68 y.o. female with a diagnosis of Diabetes: Type 2.   ASSESSMENT  History includes: reviewed; type 2 diabetes, HLD, stroke, HTN Labs noted: 10/17/23 A1c 5.6% Medications include: reviewed; mounjaro  Supplements: vitamin b12, coq10, calcium  citrate, vitamin d , ferrous sulfate   Pt present today with her daughter Bari.   Pt reports she was hospitalized in September and was diagnosed with diverticulitis and wants to figure out what she should be eating.   Pt reports she started mounjaro  about a year or two ago but has not been able to determine if any of her GI issues have started since starting it.   Pt reports she has typically not eaten breakfast throughout her life.   Pt reports historically she would only drink 0-16 oz water  daily. Pt reports after her hospitalization in September she has increased water  to 32-48 oz.   Pt reports after hospitalization she was started on iron  supplementation 2x/day, along with Miralax  2x/day and Colace 2x/day. Pt reports since then she has had diarrhea (type 7) 6 times per day. Pt reports she was not told how long she should be taking the laxatives.   There were no vitals taken for this visit. There is no height or weight on file to calculate BMI.   Diabetes Self-Management Education - 09/19/24 0936       Visit Information   Visit Type First/Initial      Initial Visit   Diabetes Type Type 2      Psychosocial Assessment   Patient Belief/Attitude about Diabetes Motivated to manage diabetes    What is the hardest part about your diabetes right now, causing you the most concern, or is the most worrisome to you about your diabetes?   Making healty food and beverage choices    Self-care barriers None    Self-management support Doctor's office    Other persons  present Patient;Family Member    Patient Concerns Nutrition/Meal planning    Special Needs None    Preferred Learning Style No preference indicated    Learning Readiness Ready    How often do you need to have someone help you when you read instructions, pamphlets, or other written materials from your doctor or pharmacy? 1 - Never      Pre-Education Assessment   Patient understands the diabetes disease and treatment process. Needs Instruction    Patient understands incorporating nutritional management into lifestyle. Needs Instruction    Patient undertands incorporating physical activity into lifestyle. Needs Instruction    Patient understands using medications safely. Needs Instruction    Patient understands monitoring blood glucose, interpreting and using results Needs Instruction    Patient understands prevention, detection, and treatment of acute complications. Needs Instruction    Patient understands prevention, detection, and treatment of chronic complications. Needs Instruction    Patient understands how to develop strategies to address psychosocial issues. Needs Instruction    Patient understands how to develop strategies to promote health/change behavior. Needs Instruction      Complications   Last HgB A1C per patient/outside source 5.6 %    How often do you check your blood sugar? 0 times/day (not testing)      Dietary Intake   Breakfast none    Snack (morning) none    Lunch 11am: sandwich, chips    Snack (afternoon) none  Dinner chicken, greens, potato salad OR spaghetti and cod fritters    Snack (evening) none    Beverage(s) 2-3 c coffee with cream,. 32 oz water       Activity / Exercise   Activity / Exercise Type Light (walking / raking leaves)      Patient Education   Disease Pathophysiology Definition of diabetes, type 1 and 2, and the diagnosis of diabetes;Explored patient's options for treatment of their diabetes    Healthy Eating Role of diet in the treatment of  diabetes and the relationship between the three main macronutrients and blood glucose level;Plate Method;Meal options for control of blood glucose level and chronic complications.;Meal timing in regards to the patients' current diabetes medication.    Being Active Helped patient identify appropriate exercises in relation to his/her diabetes, diabetes complications and other health issue.    Medications Reviewed patients medication for diabetes, action, purpose, timing of dose and side effects.    Monitoring Identified appropriate SMBG and/or A1C goals.    Chronic complications Relationship between chronic complications and blood glucose control;Identified and discussed with patient  current chronic complications    Diabetes Stress and Support Identified and addressed patients feelings and concerns about diabetes;Worked with patient to identify barriers to care and solutions    Lifestyle and Health Coping Lifestyle issues that need to be addressed for better diabetes care      Individualized Goals (developed by patient)   Nutrition General guidelines for healthy choices and portions discussed    Physical Activity Exercise 3-5 times per week;15 minutes per day    Medications Not Applicable    Monitoring  Test my blood glucose as discussed    Problem Solving Eating Pattern    Reducing Risk examine blood glucose patterns;do foot checks daily;treat hypoglycemia with 15 grams of carbs if blood glucose less than 70mg /dL    Health Coping Ask for help with psychological, social, or emotional issues      Post-Education Assessment   Patient understands the diabetes disease and treatment process. Comprehends key points    Patient understands incorporating nutritional management into lifestyle. Comprehends key points    Patient undertands incorporating physical activity into lifestyle. Comprehends key points    Patient understands using medications safely. Comphrehends key points    Patient understands  monitoring blood glucose, interpreting and using results Comprehends key points    Patient understands prevention, detection, and treatment of acute complications. Comprehends key points    Patient understands prevention, detection, and treatment of chronic complications. Comprehends key points    Patient understands how to develop strategies to address psychosocial issues. Comprehends key points    Patient understands how to develop strategies to promote health/change behavior. Comprehends key points      Outcomes   Expected Outcomes Demonstrated interest in learning. Expect positive outcomes    Future DMSE 4-6 wks    Program Status Not Completed          Individualized Plan for Diabetes Self-Management Training:   Learning Objective:  Patient will have a greater understanding of diabetes self-management. Patient education plan is to attend individual and/or group sessions per assessed needs and concerns.   Plan:   Patient Instructions  Goals Established by Patient:   Goal 1: aim for 64 oz water  daily. 3-4 bottles.   Goal 2: take your iron  supplement with food (breakfast/dinner).  Goal 3: incorporate a breakfast. (Grits and eggs and fruit, oatmeal and boiled egg, greek yogurt and granola and fruit, veggie omelet with  whole wheat toast).  Talk to your doctor about the laxatives.  Expected Outcomes:  Demonstrated interest in learning. Expect positive outcomes  Education material provided: ADA - How to Thrive: A Guide for Your Journey with Diabetes and My Plate, Diverticulosis vs Diverticulitis.   If problems or questions, patient to contact team via:  Phone  Future DSME appointment: 4-6 wks

## 2024-09-23 NOTE — Patient Instructions (Signed)
 Goals Established by Patient:   Goal 1: aim for 64 oz water  daily. 3-4 bottles.   Goal 2: take your iron  supplement with food (breakfast/dinner).  Goal 3: incorporate a breakfast. (Grits and eggs and fruit, oatmeal and boiled egg, greek yogurt and granola and fruit, veggie omelet with whole wheat toast).  Talk to your doctor about the laxatives.

## 2024-09-26 ENCOUNTER — Other Ambulatory Visit (INDEPENDENT_AMBULATORY_CARE_PROVIDER_SITE_OTHER)

## 2024-09-26 DIAGNOSIS — E7800 Pure hypercholesterolemia, unspecified: Secondary | ICD-10-CM

## 2024-09-26 DIAGNOSIS — Z79899 Other long term (current) drug therapy: Secondary | ICD-10-CM | POA: Diagnosis not present

## 2024-09-26 DIAGNOSIS — E118 Type 2 diabetes mellitus with unspecified complications: Secondary | ICD-10-CM

## 2024-09-27 LAB — LIPID PANEL
Chol/HDL Ratio: 4.1 ratio (ref 0.0–4.4)
Cholesterol, Total: 207 mg/dL — ABNORMAL HIGH (ref 100–199)
HDL: 51 mg/dL (ref >39)
LDL Chol Calc (NIH): 145 mg/dL — ABNORMAL HIGH (ref 0–99)
Triglycerides: 63 mg/dL (ref 0–149)
VLDL Cholesterol Cal: 11 mg/dL (ref 5–40)

## 2024-09-27 LAB — CBC WITH DIFFERENTIAL/PLATELET
Basophils Absolute: 0.1 x10E3/uL (ref 0.0–0.2)
Basos: 1 %
EOS (ABSOLUTE): 0.1 x10E3/uL (ref 0.0–0.4)
Eos: 2 %
Hematocrit: 41.1 % (ref 34.0–46.6)
Hemoglobin: 13.3 g/dL (ref 11.1–15.9)
Immature Grans (Abs): 0 x10E3/uL (ref 0.0–0.1)
Immature Granulocytes: 0 %
Lymphocytes Absolute: 1.6 x10E3/uL (ref 0.7–3.1)
Lymphs: 45 %
MCH: 32 pg (ref 26.6–33.0)
MCHC: 32.4 g/dL (ref 31.5–35.7)
MCV: 99 fL — ABNORMAL HIGH (ref 79–97)
Monocytes Absolute: 0.5 x10E3/uL (ref 0.1–0.9)
Monocytes: 13 %
Neutrophils Absolute: 1.4 x10E3/uL (ref 1.4–7.0)
Neutrophils: 39 %
Platelets: 253 x10E3/uL (ref 150–450)
RBC: 4.16 x10E6/uL (ref 3.77–5.28)
RDW: 11.7 % (ref 11.7–15.4)
WBC: 3.6 x10E3/uL (ref 3.4–10.8)

## 2024-09-27 LAB — COMPREHENSIVE METABOLIC PANEL WITH GFR
ALT: 11 IU/L (ref 0–32)
AST: 16 IU/L (ref 0–40)
Albumin: 4 g/dL (ref 3.9–4.9)
Alkaline Phosphatase: 52 IU/L (ref 49–135)
BUN/Creatinine Ratio: 10 — ABNORMAL LOW (ref 12–28)
BUN: 11 mg/dL (ref 8–27)
Bilirubin Total: 0.3 mg/dL (ref 0.0–1.2)
CO2: 23 mmol/L (ref 20–29)
Calcium: 9.8 mg/dL (ref 8.7–10.3)
Chloride: 103 mmol/L (ref 96–106)
Creatinine, Ser: 1.08 mg/dL — ABNORMAL HIGH (ref 0.57–1.00)
Globulin, Total: 2.8 g/dL (ref 1.5–4.5)
Glucose: 80 mg/dL (ref 70–99)
Potassium: 4.1 mmol/L (ref 3.5–5.2)
Sodium: 138 mmol/L (ref 134–144)
Total Protein: 6.8 g/dL (ref 6.0–8.5)
eGFR: 55 mL/min/1.73 — ABNORMAL LOW (ref >59)

## 2024-09-27 LAB — HEMOGLOBIN A1C
Est. average glucose Bld gHb Est-mCnc: 94 mg/dL
Hgb A1c MFr Bld: 4.9 % (ref 4.8–5.6)

## 2024-09-27 LAB — MICROALBUMIN / CREATININE URINE RATIO
Creatinine, Urine: 13.4 mg/dL
Microalb/Creat Ratio: <22 mg/g{creat} (ref 0–29)
Microalbumin, Urine: <3 ug/mL

## 2024-09-29 ENCOUNTER — Other Ambulatory Visit

## 2024-10-07 ENCOUNTER — Encounter: Payer: Self-pay | Admitting: Internal Medicine

## 2024-10-07 ENCOUNTER — Ambulatory Visit: Admitting: Internal Medicine

## 2024-10-07 VITALS — BP 180/108 | HR 78 | Resp 20 | Ht 61.0 in | Wt 163.0 lb

## 2024-10-07 DIAGNOSIS — Z5189 Encounter for other specified aftercare: Secondary | ICD-10-CM | POA: Diagnosis not present

## 2024-10-07 DIAGNOSIS — I10 Essential (primary) hypertension: Secondary | ICD-10-CM | POA: Diagnosis not present

## 2024-10-07 DIAGNOSIS — Z0000 Encounter for general adult medical examination without abnormal findings: Secondary | ICD-10-CM

## 2024-10-07 DIAGNOSIS — Z23 Encounter for immunization: Secondary | ICD-10-CM | POA: Diagnosis not present

## 2024-10-07 DIAGNOSIS — Z8719 Personal history of other diseases of the digestive system: Secondary | ICD-10-CM | POA: Diagnosis not present

## 2024-10-07 DIAGNOSIS — I2510 Atherosclerotic heart disease of native coronary artery without angina pectoris: Secondary | ICD-10-CM | POA: Diagnosis not present

## 2024-10-07 DIAGNOSIS — E118 Type 2 diabetes mellitus with unspecified complications: Secondary | ICD-10-CM | POA: Diagnosis not present

## 2024-10-07 DIAGNOSIS — E7800 Pure hypercholesterolemia, unspecified: Secondary | ICD-10-CM

## 2024-10-07 NOTE — Progress Notes (Signed)
 "   Subjective:    Patient ID: Haley [name], female   DOB: September 05, 1956, 68 y.o.   MRN: [id number]   HPI  CPE without pap  1.  Pap:  Hysterectomy 1994  2.  Mammogram:  Last 11/2023 and normal.   Has received letter to schedule for 2026.    3.  Osteoprevention:  Taking Calcium  citrate and Vitamin D .  Exercises daily.  DEXA bone density normal in 2019.    4.  Guaiac Cards/FIT:  Last with diverticular bleed in September and +  5.  Colonoscopy:  Last for colon cancer screen 2021 and without polyps.  Not due for repeat for colon cancer screen until 2031.    6.  Immunizations:  Needs COVID.   Immunization History  Administered Date(s) Administered   Fluad Trivalent(High Dose 65+) 07/27/2023   INFLUENZA, HIGH DOSE SEASONAL PF 07/24/2019, 07/14/2024   Influenza Inj Mdck Quad Pf 07/24/2022   Influenza, Quadrivalent, Recombinant, Inj, Pf 08/01/2016   Influenza,inj,Quad PF,6+ Mos 12/28/2015, 07/27/2017, 08/04/2018   Influenza-Unspecified 08/13/1996, 07/10/2017, 07/24/2019, 08/04/2020, 08/08/2021   Moderna Covid-19 Vaccine  Bivalent Booster 69yrs & up 02/24/2022   Moderna Sars-Covid-2 Vaccination 06/23/2020, 07/21/2020, 01/19/2021   PFIZER Comirnaty(Gray Top)Covid-19 Tri-Sucrose Vaccine 05/23/2021   Pfizer Covid-19 Vaccine Bivalent Booster 57yrs & up 07/28/2021   Pfizer(Comirnaty)Fall Seasonal Vaccine 12 years and older 09/14/2022, 08/31/2023   Pneumococcal Conjugate-13 09/19/2019   Pneumococcal Polysaccharide-23 08/13/1996, 07/22/2013, 09/21/2020   Td 11/12/1996   Tdap 04/18/2016   Zoster Recombinant(Shingrix) 01/04/2021, 04/06/2021     7.  Glucose/Cholesterol:  A1C now down to 4.9%.  She would like to stay on Mounjaro  until down to 155 lbs.  Stopped Rosuvastatin  due to significant muscle cramps in August.  She restarted the med and muscle cramps started back up.  Has been tried on Repatha  with Cardiology, which also started cramping.   Lipid Panel     Component Value Date/Time    CHOL 207 (H) 09/26/2024 1031   TRIG 63 09/26/2024 1031   HDL 51 09/26/2024 1031   CHOLHDL 4.1 09/26/2024 1031   LDLCALC 145 (H) 09/26/2024 1031   LDLDIRECT 134 (H) 07/22/2013 1620   LABVLDL 11 09/26/2024 1031      Current Meds  Medication Sig   ACCU-CHEK GUIDE TEST test strip SMARTSIG:Strip(s)   Alcohol Swabs (PHARMACIST CHOICE ALCOHOL) PADS    Blood Glucose Calibration (ACCU-CHEK GUIDE CONTROL) LIQD    Cholecalciferol (DIALYVITE VITAMIN D  5000) 125 MCG (5000 UT) capsule Take 15,000 Units by mouth daily.   Coenzyme Q10 (CO Q 10 PO) Take 1 tablet by mouth daily.   Cyanocobalamin  (B-12 PO) Take 1 tablet by mouth daily. (Patient taking differently: Take 1 tablet by mouth every other day.)   docusate sodium  (COLACE) 100 MG capsule Take 1 capsule (100 mg total) by mouth 2 (two) times daily.   ferrous sulfate  325 (65 FE) MG tablet Take 1 tablet (325 mg total) by mouth 2 (two) times daily with a meal.   Liniments (BLUE-EMU SUPER STRENGTH EX) Apply 1 application topically daily as needed (pain).   lisinopril  (ZESTRIL ) 40 MG tablet Take 1 tablet (40 mg total) by mouth daily.   metoprolol  succinate (TOPROL -XL) 50 MG 24 hr tablet Take 1 tablet (50 mg total) by mouth as needed (PALPITATIONS). Take with or immediately following a meal. (Patient taking differently: Take 50 mg by mouth daily. Take with or immediately following a meal.)   mometasone  (NASONEX ) 50 MCG/ACT nasal spray USE 2 SPRAY(S) IN EACH NOSTRIL ONCE  DAILY (Patient taking differently: Place 2 sprays into the nose every other day. USE 2 SPRAY(S) IN EACH NOSTRIL ONCE DAILY)   Multiple Vitamins-Minerals (MULTIVITAMIN WITH MINERALS) tablet Take 1 tablet by mouth daily.   nitroGLYCERIN  (NITROSTAT ) 0.4 MG SL tablet DISSOLVE ONE TABLET UNDER THE TONGUE EVERY 5 MINUTES AS NEEDED FOR CHEST PAIN.  DO NOT EXCEED A TOTAL OF 3 DOSES IN 15 MINUTES   omega-3 acid ethyl esters (LOVAZA ) 1 g capsule Take 1 capsule (1 g total) by mouth 2 (two) times daily.    pantoprazole  (PROTONIX ) 40 MG tablet Take 1 tablet (40 mg total) by mouth 2 (two) times daily before a meal.   Pharmacist Choice Lancets MISC    polyethylene glycol (MIRALAX  / GLYCOLAX ) 17 g packet Take 17 g by mouth daily as needed. (Patient taking differently: Take 17 g by mouth daily.)   tirzepatide  (MOUNJARO ) 15 MG/0.5ML Pen Inject 15 mg into the skin once a week.   Allergies  Allergen Reactions   Iron  Sucrose Shortness Of Breath, Other (See Comments) and Hypertension    IV IRON  INFUSION: Patient with chest tightness and heaviness, difficulty breathing, left arm heaviness and numbness, SBP increases 30-50 points quickly with impending doom. Symptoms resolve when BP comes down. Attempted multiple rates (132.5, 66, 20 mL/hr) with same symptoms following.    Praluent  [Alirocumab ]     Had significant fatigue and memory loss--was lost driving in town.  Joint pain and nausea as well.   Zetia [Ezetimibe] Other (See Comments)    Joint pain and ear ringing   Metformin And Related    Atorvastatin  Other (See Comments)    Pt states causes bilateral lower extremity muscle cramps   Pravastatin  Nausea And Vomiting and Other (See Comments)    Causes dizziness   Repatha  [Evolocumab ] Other (See Comments)    cramps   Rosuvastatin  Other (See Comments)    Leg cramping on 5-10mg  daily    Past Surgical History:  Procedure Laterality Date   ABDOMINAL HYSTERECTOMY  1994   Laparoscopic; ovaries still intact.  Performed for prolapsed uterus.   AREOLA/NIPPLE RECONSTRUCTION WITH GRAFT Bilateral 09/19/2021   Procedure: FREE NIPPLE GRAFT;  Surgeon: Elisabeth Craig RAMAN, MD;  Location: [facility] OR;  Service: Plastics;  Laterality: Bilateral;   BREAST REDUCTION SURGERY Bilateral 09/19/2021   Procedure: Bilateral Breast Reduction;  Surgeon: Elisabeth Craig RAMAN, MD;  Location: [facility] OR;  Service: Plastics;  Laterality: Bilateral;  2 hours   CARDIAC CATHETERIZATION N/A 10/23/2016   Procedure: Left Heart Cath and Coronary  Angiography;  Surgeon: Lonni JONETTA Cash, MD;  Location: [facility] INVASIVE CV LAB;  Service: Cardiovascular;  Laterality: N/A;   CHOLECYSTECTOMY  1997   Laparoscopic   COLONOSCOPY  2005-at age 70   Hyperplastic polyp--[place]ima[place]   FLEXIBLE SIGMOIDOSCOPY N/A 07/27/2024   Procedure: KINGSTON SIDE;  Surgeon: Wilhelmenia Aloha Raddle., MD;  Location: [facility] ENDOSCOPY;  Service: Gastroenterology;  Laterality: N/A;   LEFT HEART CATH AND CORONARY ANGIOGRAPHY N/A 05/04/2020   Procedure: LEFT HEART CATH AND CORONARY ANGIOGRAPHY;  Surgeon: Jordan, Peter M, MD;  Location: [facility] INVASIVE CV LAB;  Service: Cardiovascular;  Laterality: N/A;   WISDOM TOOTH EXTRACTION     Family History  Problem Relation Age of Onset   Diabetes Father    Diabetes Sister        prediabetic   Cancer Sister 11       uterine cancer   Hypertension Brother    Diabetes Brother    Seizures Brother  Stroke Maternal Aunt        Multiple aunts died from strokes-maternal   Hypertension Son    Diabetes Son    Colon cancer Neg Hx    Esophageal cancer Neg Hx    Pancreatic cancer Neg Hx    Stomach cancer Neg Hx    Social History   Socioeconomic History   Marital status: Single    Spouse name: Not on file   Number of children: 2   Years of education: 12+   Highest education level: Some college, no degree  Occupational History   Occupation: Print Production Planner    Comment: Retired now  Tobacco Use   Smoking status: Never    Passive exposure: Never   Smokeless tobacco: Never  Vaping Use   Vaping status: Never Used  Substance and Sexual Activity   Alcohol use: Not Currently   Drug use: No   Sexual activity: Not Currently    Birth control/protection: Surgical  Other Topics Concern   Not on file  Social History Narrative   Did get some post high school training/education   Lives alone   Daughter lives in [place] and checks in regularly.   Social Drivers of Corporate Investment Banker Strain: Low Risk   (10/07/2024)   Overall Financial Resource Strain (CARDIA)    Difficulty of Paying Living Expenses: Not hard at all  Food Insecurity: No Food Insecurity (10/07/2024)   Hunger Vital Sign    Worried About Running Out of Food in the Last Year: Never true    Ran Out of Food in the Last Year: Never true  Transportation Needs: No Transportation Needs (10/07/2024)   PRAPARE - Administrator, Civil Service (Medical): No    Lack of Transportation (Non-Medical): No  Physical Activity: Unknown (08/15/2021)   Received from [facility]   Exercise Vital Sign    On average, how many days per week do you engage in moderate to strenuous exercise (like a brisk walk)?: Patient declined    Minutes of Exercise per Session: Not on file  Stress: Unknown (08/15/2021)   Received from [facility] - Occupational Stress Questionnaire    Feeling of Stress : Patient declined  Social Connections: Moderately Isolated (07/25/2024)   Social Connection and Isolation Panel    Frequency of Communication with Friends and Family: More than three times a week    Frequency of Social Gatherings with Friends and Family: More than three times a week    Attends Religious Services: 1 to 4 times per year    Active Member of [employer] or Organizations: No    Attends Banker Meetings: Never    Marital Status: Divorced  Catering Manager Violence: Not At Risk (10/07/2024)   Humiliation, Afraid, Rape, and Kick questionnaire    Fear of Current or Ex-Partner: No    Emotionally Abused: No    Physically Abused: No    Sexually Abused: No     Review of Systems  Eyes:  Negative for visual disturbance (Eye check for diabetes done this year.  Dr. Octavia).  Respiratory:  Negative for shortness of breath.   Cardiovascular:  Positive for palpitations (short period of time x 2.  no dizziness). Negative for chest pain and leg swelling.  Gastrointestinal:  Negative for blood in  stool.  Neurological:  Negative for weakness and numbness.      Objective:   BP (!) 180/108 (BP Location: Left Arm, Patient Position:  Sitting, Cuff Size: Normal)   Pulse 78   Resp 20   Ht 5' 1 (1.549 m)   Wt 163 lb (73.9 kg)   BMI 30.80 kg/m   Physical Exam HENT:     Head: Normocephalic and atraumatic.     Right Ear: Tympanic membrane, ear canal and external ear normal.     Left Ear: Tympanic membrane, ear canal and external ear normal.     Nose: Nose normal.     Mouth/Throat:     Mouth: Mucous membranes are moist.     Pharynx: Oropharynx is clear.  Eyes:     Extraocular Movements: Extraocular movements intact.     Conjunctiva/sclera: Conjunctivae normal.     Pupils: Pupils are equal, round, and reactive to light.     Comments: Discs sharp  Neck:     Thyroid: No thyroid mass or thyromegaly.  Cardiovascular:     Rate and Rhythm: Normal rate and regular rhythm.     Pulses:          Dorsalis pedis pulses are 2+ on the right side and 2+ on the left side.       Posterior tibial pulses are 2+ on the right side and 2+ on the left side.     Heart sounds: S1 normal and S2 normal. No murmur heard.    No friction rub. No S3 or S4 sounds.     Comments: No carotid bruits.  Carotid, radial, femoral, DP and PT pulses normal and equal.   Pulmonary:     Effort: Pulmonary effort is normal.     Breath sounds: Normal breath sounds and air entry.  Chest:  Breasts:    Right: No inverted nipple, mass or nipple discharge.     Left: No inverted nipple, mass or nipple discharge.     Comments: Breast reduction scars Abdominal:     General: Bowel sounds are normal.     Palpations: Abdomen is soft. There is no hepatomegaly, splenomegaly or mass.     Tenderness: There is no abdominal tenderness.     Hernia: No hernia is present.  Genitourinary:    Comments: Declined pelvic exam Musculoskeletal:        General: Normal range of motion.     Cervical back: Normal range of motion and neck  supple.     Right lower leg: No edema.     Left lower leg: No edema.  Feet:     Right foot:     Protective Sensation: 10 sites tested.  10 sites sensed.     Skin integrity: Skin integrity normal.     Left foot:     Protective Sensation: 10 sites tested.  10 sites sensed.     Skin integrity: Skin integrity normal.  Lymphadenopathy:     Head:     Right side of head: No submental or submandibular adenopathy.     Left side of head: No submental or submandibular adenopathy.     Cervical: No cervical adenopathy.     Upper Body:     Right upper body: No supraclavicular or axillary adenopathy.     Left upper body: No supraclavicular or axillary adenopathy.     Lower Body: No right inguinal adenopathy. No left inguinal adenopathy.  Skin:    General: Skin is warm.     Capillary Refill: Capillary refill takes less than 2 seconds.     Findings: No rash.  Neurological:     General: No focal deficit present.  Mental Status: She is alert and oriented to person, place, and time.     Cranial Nerves: Cranial nerves 2-12 are intact.     Sensory: Sensation is intact.     Motor: Motor function is intact.     Coordination: Coordination is intact.     Gait: Gait is intact.     Deep Tendon Reflexes: Reflexes are normal and symmetric.  Psychiatric:        Mood and Affect: Mood normal.        Behavior: Behavior normal. Behavior is cooperative.      Assessment & Plan   CPE without pap Out of COVID vaccine today--to obtain as pharmacy of choice and will notify when we receive shipment.  2.  Recent diverticular  bleed:  she is taking Colace and Miralax  twice daily to prevent recurrence.  With her request for no transfusion, she knows to present to ED immediately for blood in stool.    3.  Hypercholesterolemia:  not tolerating meds, including statins, Praluent , Bempedoic Acid , and Repatha .  Will discuss if any other options with Cardiology.  ?Inclisiran  4.  DM:  Control now excellent with A1C  down to 4.9% with Mounjaro  15 mg alone.  Weight still in obese range, but much improved.  No microalbuminuria.  5.  Hypertension: Metoprolol  XL 50 mg and Lisinopril  40 mg States she is taking regularly.  She always has high BPs in medical offices.  She is getting BPs 108-116/68-78 at home and we have checked her home bp monitor with ours in past and they give similar measurements.  No change in meds for now.  She will bring in her home monitor twice yearly with home measurements to confirm generally well controlled.  6.  CAD:  asymptomatic.  Not on ASA since diverticular bleed "

## 2024-10-24 ENCOUNTER — Other Ambulatory Visit

## 2024-10-24 ENCOUNTER — Encounter: Admitting: Dietician

## 2024-11-10 ENCOUNTER — Other Ambulatory Visit: Payer: Self-pay | Admitting: Internal Medicine

## 2024-11-14 ENCOUNTER — Other Ambulatory Visit

## 2024-11-17 ENCOUNTER — Other Ambulatory Visit: Payer: Self-pay | Admitting: Internal Medicine

## 2024-11-17 DIAGNOSIS — Z1231 Encounter for screening mammogram for malignant neoplasm of breast: Secondary | ICD-10-CM

## 2024-11-28 ENCOUNTER — Other Ambulatory Visit: Payer: Self-pay | Admitting: Internal Medicine

## 2024-11-28 ENCOUNTER — Ambulatory Visit
Admission: RE | Admit: 2024-11-28 | Discharge: 2024-11-28 | Disposition: A | Source: Ambulatory Visit | Attending: Internal Medicine | Admitting: Internal Medicine

## 2024-11-28 ENCOUNTER — Other Ambulatory Visit (INDEPENDENT_AMBULATORY_CARE_PROVIDER_SITE_OTHER)

## 2024-11-28 DIAGNOSIS — D509 Iron deficiency anemia, unspecified: Secondary | ICD-10-CM

## 2024-11-28 DIAGNOSIS — Z1231 Encounter for screening mammogram for malignant neoplasm of breast: Secondary | ICD-10-CM

## 2024-11-28 MED ORDER — FERROUS SULFATE 325 (65 FE) MG PO TABS: ORAL_TABLET | ORAL | 3 refills | Status: AC

## 2024-11-29 LAB — CBC WITH DIFFERENTIAL/PLATELET
Basophils Absolute: 0.1 10*3/uL (ref 0.0–0.2)
Basos: 1 %
EOS (ABSOLUTE): 0.1 10*3/uL (ref 0.0–0.4)
Eos: 2 %
Hematocrit: 41.5 % (ref 34.0–46.6)
Hemoglobin: 13.7 g/dL (ref 11.1–15.9)
Immature Grans (Abs): 0 10*3/uL (ref 0.0–0.1)
Immature Granulocytes: 0 %
Lymphocytes Absolute: 1.9 10*3/uL (ref 0.7–3.1)
Lymphs: 43 %
MCH: 31 pg (ref 26.6–33.0)
MCHC: 33 g/dL (ref 31.5–35.7)
MCV: 94 fL (ref 79–97)
Monocytes Absolute: 0.5 10*3/uL (ref 0.1–0.9)
Monocytes: 12 %
Neutrophils Absolute: 1.8 10*3/uL (ref 1.4–7.0)
Neutrophils: 42 %
Platelets: 251 10*3/uL (ref 150–450)
RBC: 4.42 x10E6/uL (ref 3.77–5.28)
RDW: 12.5 % (ref 11.7–15.4)
WBC: 4.4 10*3/uL (ref 3.4–10.8)

## 2024-11-30 ENCOUNTER — Ambulatory Visit: Payer: Self-pay | Admitting: Internal Medicine

## 2024-12-02 ENCOUNTER — Telehealth: Payer: Self-pay | Admitting: Internal Medicine

## 2024-12-02 DIAGNOSIS — E785 Hyperlipidemia, unspecified: Secondary | ICD-10-CM

## 2024-12-02 NOTE — Addendum Note (Signed)
 Addended by: BETHENA POWELL SAUNDERS on: 12/02/2024 04:58 PM   Modules accepted: Orders

## 2024-12-02 NOTE — Telephone Encounter (Signed)
 Dr. Adella calling in regard to mutual patient. She wants pts doctor to know her cholesterol is currently unmanaged and feels she would benefit from f/u with lipid clinic. Dr. Adella requesting c/b. Please advise  Cell #[phone]  Office #[phone]

## 2024-12-02 NOTE — Addendum Note (Signed)
 Addended by: LORING ANDRIETTE HERO on: 12/02/2024 04:56 PM   Modules accepted: Orders

## 2024-12-02 NOTE — Telephone Encounter (Signed)
Lipid clinic referral ordered.

## 2025-01-09 ENCOUNTER — Other Ambulatory Visit

## 2025-01-13 ENCOUNTER — Ambulatory Visit: Admitting: Internal Medicine

## 2025-04-16 ENCOUNTER — Ambulatory Visit: Admitting: Internal Medicine

## 2025-10-09 ENCOUNTER — Other Ambulatory Visit: Admitting: Internal Medicine

## 2025-10-13 ENCOUNTER — Encounter: Admitting: Internal Medicine
